# Patient Record
Sex: Male | Born: 1975 | Race: Black or African American | Hispanic: No | Marital: Married | Smoking: Current every day smoker
Health system: Southern US, Community
[De-identification: ages and names within clinical notes are randomized; demographics above are authoritative.]

## PROBLEM LIST (undated history)

## (undated) DIAGNOSIS — K219 Gastro-esophageal reflux disease without esophagitis: Secondary | ICD-10-CM

## (undated) DIAGNOSIS — I10 Essential (primary) hypertension: Secondary | ICD-10-CM

## (undated) DIAGNOSIS — E785 Hyperlipidemia, unspecified: Secondary | ICD-10-CM

## (undated) DIAGNOSIS — F419 Anxiety disorder, unspecified: Secondary | ICD-10-CM

## (undated) DIAGNOSIS — F209 Schizophrenia, unspecified: Secondary | ICD-10-CM

## (undated) DIAGNOSIS — M1990 Unspecified osteoarthritis, unspecified site: Secondary | ICD-10-CM

## (undated) HISTORY — PX: COLONOSCOPY: SHX174

## (undated) HISTORY — PX: MYRINGOTOMY WITH TUBE PLACEMENT: SHX5663

## (undated) HISTORY — PX: CYST EXCISION: SHX5701

---

## 2008-10-12 DIAGNOSIS — R945 Abnormal results of liver function studies: Secondary | ICD-10-CM | POA: Insufficient documentation

## 2009-01-11 DIAGNOSIS — E749 Disorder of carbohydrate metabolism, unspecified: Secondary | ICD-10-CM | POA: Insufficient documentation

## 2010-02-27 DIAGNOSIS — R030 Elevated blood-pressure reading, without diagnosis of hypertension: Secondary | ICD-10-CM | POA: Insufficient documentation

## 2014-06-23 ENCOUNTER — Emergency Department: Payer: Self-pay | Admitting: Emergency Medicine

## 2014-07-05 ENCOUNTER — Emergency Department: Payer: Self-pay | Admitting: Emergency Medicine

## 2016-07-04 ENCOUNTER — Ambulatory Visit
Admission: RE | Admit: 2016-07-04 | Discharge: 2016-07-04 | Disposition: A | Discharge status: Home / Self Care | Payer: Medicare Other | Source: Ambulatory Visit | Attending: Unknown Physician Specialty | Admitting: Unknown Physician Specialty

## 2016-07-04 ENCOUNTER — Other Ambulatory Visit: Payer: Self-pay | Admitting: Unknown Physician Specialty

## 2016-07-04 DIAGNOSIS — M25511 Pain in right shoulder: Secondary | ICD-10-CM | POA: Diagnosis not present

## 2016-07-04 DIAGNOSIS — X58XXXA Exposure to other specified factors, initial encounter: Secondary | ICD-10-CM | POA: Insufficient documentation

## 2016-07-04 DIAGNOSIS — S46811A Strain of other muscles, fascia and tendons at shoulder and upper arm level, right arm, initial encounter: Secondary | ICD-10-CM | POA: Insufficient documentation

## 2016-07-21 DIAGNOSIS — M7581 Other shoulder lesions, right shoulder: Secondary | ICD-10-CM | POA: Insufficient documentation

## 2016-07-21 DIAGNOSIS — M75121 Complete rotator cuff tear or rupture of right shoulder, not specified as traumatic: Secondary | ICD-10-CM | POA: Insufficient documentation

## 2016-08-14 ENCOUNTER — Encounter
Admission: RE | Admit: 2016-08-14 | Discharge: 2016-08-14 | Disposition: A | Discharge status: Home / Self Care | Payer: Medicare Other | Source: Ambulatory Visit | Attending: Surgery | Admitting: Surgery

## 2016-08-14 HISTORY — DX: Hyperlipidemia, unspecified: E78.5

## 2016-08-14 HISTORY — DX: Unspecified osteoarthritis, unspecified site: M19.90

## 2016-08-14 HISTORY — DX: Anxiety disorder, unspecified: F41.9

## 2016-08-14 HISTORY — DX: Gastro-esophageal reflux disease without esophagitis: K21.9

## 2016-08-14 NOTE — Patient Instructions (Signed)
  Your procedure is scheduled on: 08-21-16 Report to Same Day Surgery 2nd floor medical mall [facility](Medical Mall Entrance-take elevator on left to 2nd floor.  Check in with surgery information desk.) To find out your arrival time please call [phone] between 1PM - 3PM on 08-20-16  Remember: Instructions that are not followed completely may result in serious medical risk, up to and including death, or upon the discretion of your surgeon and anesthesiologist your surgery may need to be rescheduled.    _x___ 1. Do not eat food or drink liquids after midnight. No gum chewing or hard candies.     __x__ 2. No Alcohol for 24 hours before or after surgery.   __x__3. No Smoking for 24 prior to surgery.   ____  4. Bring all medications with you on the day of surgery if instructed.    __x__ 5. Notify your doctor if there is any change in your medical condition     (cold, fever, infections).     Do not wear jewelry, make-up, hairpins, clips or nail polish.  Do not wear lotions, powders, or perfumes. You may wear deodorant.  Do not shave 48 hours prior to surgery. Men may shave face and neck.  Do not bring valuables to the hospital.    [facility] any belongings or valuables.               Contacts, dentures or bridgework may not be worn into surgery.  Leave your suitcase in the car. After surgery it may be brought to your room.  For patients admitted to the hospital, discharge time is determined by your treatment team.   Patients discharged the day of surgery will not be allowed to drive home.  You will need someone to drive you home and stay with you the night of your procedure.    Please read over the following fact sheets that you were given:   [facility] Pre[facility] and or MRSA Information   _x___ Take these medicines the morning of surgery with A SIP OF WATER:    1. ABILIFY  2. MAY TAKE ATIVAN IF NEEDED AM OF SURGERY  3.  4.  5.  6.  ____Fleets enema  or Magnesium Citrate as directed.   ____ Use CHG Soap or sage wipes as directed on instruction sheet   ____ Use inhalers on the day of surgery and bring to hospital day of surgery  ____ Stop metformin 2 days prior to surgery    ____ Take 1/2 of usual insulin dose the night before surgery and none on the morning of surgery.   ____ Stop Aspirin, Coumadin, Pllavix ,Eliquis, Effient, or Pradaxa  x__ Stop Anti-inflammatories such as Advil, Aleve, Ibuprofen, Motrin, Naproxen, LODINE,          Naprosyn  Goodies powders or aspirin products NOW-Ok to take Tylenol OR HYDROCODONE IF NEEDED   ____ Stop supplements until after surgery.    ____ Bring C-Pap to the hospital.

## 2016-09-22 MED ORDER — CLINDAMYCIN PHOSPHATE 900 MG/50ML IV SOLN
900.0000 mg | Freq: Once | INTRAVENOUS | Status: AC
  Administered 2016-09-23: 900 mg via INTRAVENOUS

## 2016-09-23 ENCOUNTER — Ambulatory Visit
Admission: RE | Admit: 2016-09-23 | Discharge: 2016-09-23 | Disposition: A | Discharge status: Home / Self Care | Payer: Medicare Other | Source: Ambulatory Visit | Attending: Surgery | Admitting: Surgery

## 2016-09-23 ENCOUNTER — Ambulatory Visit: Payer: Medicare Other | Admitting: Certified Registered Nurse Anesthetist

## 2016-09-23 ENCOUNTER — Encounter: Payer: Self-pay | Admitting: *Deleted

## 2016-09-23 ENCOUNTER — Encounter
Admission: RE | Disposition: A | Discharge status: Home / Self Care | Payer: Self-pay | Source: Ambulatory Visit | Attending: Surgery

## 2016-09-23 DIAGNOSIS — Z880 Allergy status to penicillin: Secondary | ICD-10-CM | POA: Diagnosis not present

## 2016-09-23 DIAGNOSIS — E785 Hyperlipidemia, unspecified: Secondary | ICD-10-CM | POA: Insufficient documentation

## 2016-09-23 DIAGNOSIS — M65811 Other synovitis and tenosynovitis, right shoulder: Secondary | ICD-10-CM | POA: Diagnosis not present

## 2016-09-23 DIAGNOSIS — K219 Gastro-esophageal reflux disease without esophagitis: Secondary | ICD-10-CM | POA: Diagnosis not present

## 2016-09-23 DIAGNOSIS — X509XXA Other and unspecified overexertion or strenuous movements or postures, initial encounter: Secondary | ICD-10-CM | POA: Diagnosis not present

## 2016-09-23 DIAGNOSIS — M7541 Impingement syndrome of right shoulder: Secondary | ICD-10-CM | POA: Insufficient documentation

## 2016-09-23 DIAGNOSIS — F419 Anxiety disorder, unspecified: Secondary | ICD-10-CM | POA: Insufficient documentation

## 2016-09-23 DIAGNOSIS — S46011A Strain of muscle(s) and tendon(s) of the rotator cuff of right shoulder, initial encounter: Secondary | ICD-10-CM | POA: Diagnosis present

## 2016-09-23 DIAGNOSIS — M1990 Unspecified osteoarthritis, unspecified site: Secondary | ICD-10-CM | POA: Diagnosis not present

## 2016-09-23 DIAGNOSIS — Y9389 Activity, other specified: Secondary | ICD-10-CM | POA: Diagnosis not present

## 2016-09-23 HISTORY — PX: SHOULDER ARTHROSCOPY WITH OPEN ROTATOR CUFF REPAIR: SHX6092

## 2016-09-23 HISTORY — PX: SHOULDER ARTHROSCOPY WITH SUBACROMIAL DECOMPRESSION: SHX5684

## 2016-09-23 SURGERY — ARTHROSCOPY, SHOULDER WITH REPAIR, ROTATOR CUFF, OPEN
Anesthesia: General | Site: Shoulder | Laterality: Right

## 2016-09-23 MED ORDER — SUGAMMADEX SODIUM 200 MG/2ML IV SOLN
INTRAVENOUS | Status: AC
  Filled 2016-09-23: qty 2

## 2016-09-23 MED ORDER — LIDOCAINE HCL (PF) 4 % IJ SOLN
INTRAMUSCULAR | Status: DC | PRN
  Administered 2016-09-23: 4 mL via RESPIRATORY_TRACT

## 2016-09-23 MED ORDER — GLYCOPYRROLATE 0.2 MG/ML IJ SOLN
INTRAMUSCULAR | Status: AC
  Filled 2016-09-23: qty 1

## 2016-09-23 MED ORDER — FENTANYL CITRATE (PF) 250 MCG/5ML IJ SOLN
INTRAMUSCULAR | Status: AC
  Filled 2016-09-23: qty 5

## 2016-09-23 MED ORDER — BUPIVACAINE-EPINEPHRINE (PF) 0.5% -1:200000 IJ SOLN
INTRAMUSCULAR | Status: AC
  Filled 2016-09-23: qty 30

## 2016-09-23 MED ORDER — PHENYLEPHRINE HCL 10 MG/ML IJ SOLN
INTRAVENOUS | Status: DC | PRN
  Administered 2016-09-23: 20 ug/min via INTRAVENOUS

## 2016-09-23 MED ORDER — ROPIVACAINE HCL 5 MG/ML IJ SOLN
INTRAMUSCULAR | Status: DC | PRN
  Administered 2016-09-23: 30 mL via EPIDURAL

## 2016-09-23 MED ORDER — FENTANYL CITRATE (PF) 100 MCG/2ML IJ SOLN
INTRAMUSCULAR | Status: DC | PRN
  Administered 2016-09-23: 50 ug via INTRAVENOUS
  Administered 2016-09-23: 100 ug via INTRAVENOUS

## 2016-09-23 MED ORDER — OXYCODONE HCL 5 MG PO TABS: 5.0000 mg | ORAL_TABLET | ORAL | 0 refills | Status: DC | PRN

## 2016-09-23 MED ORDER — FAMOTIDINE 20 MG PO TABS
ORAL_TABLET | ORAL | Status: AC
  Filled 2016-09-23: qty 1

## 2016-09-23 MED ORDER — BUPIVACAINE-EPINEPHRINE 0.5% -1:200000 IJ SOLN
INTRAMUSCULAR | Status: DC | PRN
  Administered 2016-09-23: 30 mL

## 2016-09-23 MED ORDER — ROCURONIUM BROMIDE 50 MG/5ML IV SOLN
INTRAVENOUS | Status: AC
  Filled 2016-09-23: qty 1

## 2016-09-23 MED ORDER — ONDANSETRON HCL 4 MG PO TABS
4.0000 mg | ORAL_TABLET | Freq: Four times a day (QID) | ORAL | Status: DC | PRN
Start: 2016-09-23 — End: 2016-09-23

## 2016-09-23 MED ORDER — SUGAMMADEX SODIUM 200 MG/2ML IV SOLN
INTRAVENOUS | Status: DC | PRN
  Administered 2016-09-23: 100 mg via INTRAVENOUS

## 2016-09-23 MED ORDER — CLINDAMYCIN PHOSPHATE 900 MG/50ML IV SOLN
INTRAVENOUS | Status: AC
  Filled 2016-09-23: qty 50

## 2016-09-23 MED ORDER — LIDOCAINE HCL (PF) 2 % IJ SOLN
INTRAMUSCULAR | Status: AC
  Filled 2016-09-23: qty 2

## 2016-09-23 MED ORDER — FAMOTIDINE 20 MG PO TABS
20.0000 mg | ORAL_TABLET | Freq: Once | ORAL | Status: AC
  Administered 2016-09-23: 20 mg via ORAL

## 2016-09-23 MED ORDER — ONDANSETRON HCL 4 MG/2ML IJ SOLN: 4.0000 mg | Freq: Once | INTRAMUSCULAR | Status: DC | PRN

## 2016-09-23 MED ORDER — ONDANSETRON HCL 4 MG/2ML IJ SOLN: 4.0000 mg | Freq: Four times a day (QID) | INTRAMUSCULAR | Status: DC | PRN

## 2016-09-23 MED ORDER — ROCURONIUM BROMIDE 100 MG/10ML IV SOLN
INTRAVENOUS | Status: DC | PRN
  Administered 2016-09-23: 50 mg via INTRAVENOUS

## 2016-09-23 MED ORDER — FENTANYL CITRATE (PF) 100 MCG/2ML IJ SOLN: 25.0000 ug | INTRAMUSCULAR | Status: DC | PRN

## 2016-09-23 MED ORDER — LIDOCAINE HCL (CARDIAC) 20 MG/ML IV SOLN
INTRAVENOUS | Status: DC | PRN
  Administered 2016-09-23: 70 mg via INTRAVENOUS

## 2016-09-23 MED ORDER — DEXAMETHASONE SODIUM PHOSPHATE 10 MG/ML IJ SOLN
INTRAMUSCULAR | Status: AC
  Filled 2016-09-23: qty 1

## 2016-09-23 MED ORDER — EPINEPHRINE PF 1 MG/ML IJ SOLN
INTRAMUSCULAR | Status: AC
  Filled 2016-09-23: qty 2

## 2016-09-23 MED ORDER — PROPOFOL 10 MG/ML IV BOLUS
INTRAVENOUS | Status: AC
  Filled 2016-09-23: qty 20

## 2016-09-23 MED ORDER — OXYCODONE HCL 5 MG PO TABS: 5.0000 mg | ORAL_TABLET | ORAL | Status: DC | PRN

## 2016-09-23 MED ORDER — EPINEPHRINE PF 1 MG/ML IJ SOLN
INTRAMUSCULAR | Status: DC | PRN
  Administered 2016-09-23: 2 mL

## 2016-09-23 MED ORDER — PROPOFOL 10 MG/ML IV BOLUS
INTRAVENOUS | Status: DC | PRN
  Administered 2016-09-23: 200 mg via INTRAVENOUS

## 2016-09-23 MED ORDER — METOCLOPRAMIDE HCL 5 MG/ML IJ SOLN: 5.0000 mg | Freq: Three times a day (TID) | INTRAMUSCULAR | Status: DC | PRN

## 2016-09-23 MED ORDER — METOCLOPRAMIDE HCL 10 MG PO TABS: 5.0000 mg | ORAL_TABLET | Freq: Three times a day (TID) | ORAL | Status: DC | PRN

## 2016-09-23 MED ORDER — DEXAMETHASONE SODIUM PHOSPHATE 4 MG/ML IJ SOLN
INTRAMUSCULAR | Status: DC | PRN
  Administered 2016-09-23: 5 mg via INTRAVENOUS

## 2016-09-23 MED ORDER — POTASSIUM CHLORIDE IN NACL 20-0.9 MEQ/L-% IV SOLN
INTRAVENOUS | Status: DC
  Filled 2016-09-23: qty 1000

## 2016-09-23 MED ORDER — MIDAZOLAM HCL 2 MG/2ML IJ SOLN
INTRAMUSCULAR | Status: AC
  Filled 2016-09-23: qty 2

## 2016-09-23 MED ORDER — MIDAZOLAM HCL 2 MG/2ML IJ SOLN
INTRAMUSCULAR | Status: DC | PRN
  Administered 2016-09-23 (×2): 2 mg via INTRAVENOUS

## 2016-09-23 MED ORDER — LACTATED RINGERS IV SOLN
INTRAVENOUS | Status: DC
Start: 2016-09-23 — End: 2016-09-23
  Administered 2016-09-23: 07:00:00 via INTRAVENOUS

## 2016-09-23 SURGICAL SUPPLY — 45 items
ANCHOR JUGGERKNOT WTAP NDL 2.9 (Anchor) ×8 IMPLANT
ANCHOR SUT QUATTRO KNTLS 4.5 (Anchor) ×4 IMPLANT
BIT DRILL JUGRKNT W/NDL BIT2.9 (DRILL) ×2 IMPLANT
BLADE FULL RADIUS 3.5 (BLADE) ×4 IMPLANT
BUR ACROMIONIZER 4.0 (BURR) ×4 IMPLANT
CANNULA SHAVER 8MMX76MM (CANNULA) ×8 IMPLANT
CHLORAPREP W/TINT 26ML (MISCELLANEOUS) ×4 IMPLANT
COVER MAYO STAND STRL (DRAPES) ×4 IMPLANT
DRAPE IMP U-DRAPE 54X76 (DRAPES) ×8 IMPLANT
DRILL JUGGERKNOT W/NDL BIT 2.9 (DRILL) ×4
DRSG OPSITE POSTOP 4X8 (GAUZE/BANDAGES/DRESSINGS) ×4 IMPLANT
ELECT REM PT RETURN 9FT ADLT (ELECTROSURGICAL) ×4
ELECTRODE REM PT RTRN 9FT ADLT (ELECTROSURGICAL) ×2 IMPLANT
GAUZE PETRO XEROFOAM 1X8 (MISCELLANEOUS) ×4 IMPLANT
GAUZE SPONGE 4X4 12PLY STRL (GAUZE/BANDAGES/DRESSINGS) ×4 IMPLANT
GLOVE BIO SURGEON STRL SZ7.5 (GLOVE) ×8 IMPLANT
GLOVE BIO SURGEON STRL SZ8 (GLOVE) ×8 IMPLANT
GLOVE BIOGEL PI IND STRL 8 (GLOVE) ×2 IMPLANT
GLOVE BIOGEL PI INDICATOR 8 (GLOVE) ×2
GLOVE INDICATOR 8.0 STRL GRN (GLOVE) ×4 IMPLANT
GOWN STRL REUS W/ TWL LRG LVL3 (GOWN DISPOSABLE) ×2 IMPLANT
GOWN STRL REUS W/ TWL XL LVL3 (GOWN DISPOSABLE) ×2 IMPLANT
GOWN STRL REUS W/TWL LRG LVL3 (GOWN DISPOSABLE) ×2
GOWN STRL REUS W/TWL XL LVL3 (GOWN DISPOSABLE) ×2
GRASPER SUT 15 45D LOW PRO (SUTURE) IMPLANT
IV LACTATED RINGER IRRG 3000ML (IV SOLUTION) ×4
IV LR IRRIG 3000ML ARTHROMATIC (IV SOLUTION) ×4 IMPLANT
MANIFOLD NEPTUNE II (INSTRUMENTS) ×4 IMPLANT
MASK FACE SPIDER DISP (MASK) ×4 IMPLANT
MAT BLUE FLOOR 46X72 FLO (MISCELLANEOUS) ×4 IMPLANT
NEEDLE MAYO 6 CRC TAPER PT (NEEDLE) ×4 IMPLANT
NEEDLE REVERSE CUT 1/2 CRC (NEEDLE) IMPLANT
PACK ARTHROSCOPY SHOULDER (MISCELLANEOUS) ×4 IMPLANT
SLING ARM LRG DEEP (SOFTGOODS) IMPLANT
SLING ULTRA II LG (MISCELLANEOUS) ×4 IMPLANT
STAPLER SKIN PROX 35W (STAPLE) ×4 IMPLANT
STRAP SAFETY BODY (MISCELLANEOUS) ×4 IMPLANT
SUT ETHIBOND 0 MO6 C/R (SUTURE) ×4 IMPLANT
SUT VIC AB 2-0 CT1 27 (SUTURE) ×4
SUT VIC AB 2-0 CT1 TAPERPNT 27 (SUTURE) ×4 IMPLANT
TAPE MICROFOAM 4IN (TAPE) ×4 IMPLANT
TUBING ARTHRO INFLOW-ONLY STRL (TUBING) ×4 IMPLANT
TUBING CONNECTING 10 (TUBING) ×3 IMPLANT
TUBING CONNECTING 10' (TUBING) ×1
WAND HAND CNTRL MULTIVAC 90 (MISCELLANEOUS) ×4 IMPLANT

## 2016-09-23 NOTE — Anesthesia Procedure Notes (Addendum)
Anesthesia Regional Block: Interscalene brachial plexus block   Pre-Anesthetic Checklist: ,, timeout performed, Correct Patient, Correct Site, Correct Laterality, Correct Procedure, Correct Position, site marked, Risks and benefits discussed,  Surgical consent,  Pre-op evaluation,  At surgeon's request and post-op pain management  Laterality: Right  Prep: chloraprep, alcohol swabs       Needles:  Injection technique: Single-shot  Needle Type: Stimiplex     Needle Length: 5cm  Needle Gauge: 22     Additional Needles:   Procedures: ultrasound guided, nerve stimulator,,,,,,   Nerve Stimulator or Paresthesia:  Response: biceps flexion, 0.48 mA,   Additional Responses:   Narrative:  Start time: 09/23/2016 7:45 AM End time: 09/23/2016 7:49 AM Injection made incrementally with aspirations every 5 mL.  Performed by: Personally  Anesthesiologist: Yves DillARROLL, Devion Chriscoe  Additional Notes: Time out called.  Patient in semi-recumbent position with a roll placed under his right back.  The right neck was prepped with chloroprep.  Sterile US gel was used and an US probe was used to visualize the nerve bundle in the C6 position.  A skin wheal was made lateral to the probe and a 22 G stimuplex needle was advanced to the bundle with the tip in view.  A biceps twitch was obtained with fade at 0.48 mAmps.  Easy incremental injection and no pain or blood on injection.  Patient tolerated the procedure well.

## 2016-09-23 NOTE — Anesthesia Postprocedure Evaluation (Signed)
Anesthesia Post Note  Patient: Justin Nixon  Procedure(s) Performed: Procedure(s) (LRB): SHOULDER ARTHROSCOPY WITH OPEN ROTATOR CUFF REPAIR (Right) SHOULDER ARTHROSCOPY WITH SUBACROMIAL DECOMPRESSION AND DEBRIDEMENT (Right)  Patient location during evaluation: PACU Anesthesia Type: General Level of consciousness: awake and alert and oriented Pain management: pain level controlled Vital Signs Assessment: post-procedure vital signs reviewed and stable Respiratory status: spontaneous breathing Cardiovascular status: blood pressure returned to baseline Anesthetic complications: no     Last Vitals:  Vitals:   09/23/16 1029 09/23/16 1059  BP: 135/88 (!) 132/97  Pulse: 80 71  Resp: 16 16  Temp: 36.2 C     Last Pain:  Vitals:   09/23/16 1029  TempSrc: Temporal                 Gayna Braddy

## 2016-09-23 NOTE — Anesthesia Procedure Notes (Signed)
Procedure Name: Intubation Date/Time: 09/23/2016 7:55 AM Performed by: Rosaria Ferries, Zander Ingham Pre-anesthesia Checklist: Patient identified, Emergency Drugs available, Suction available and Patient being monitored Patient Re-evaluated:Patient Re-evaluated prior to inductionOxygen Delivery Method: Circle system utilized Preoxygenation: Pre-oxygenation with 100% oxygen Intubation Type: IV induction Laryngoscope Size: Mac and 3 Grade View: Grade I Tube size: 7.0 mm Number of attempts: 1 Airway Equipment and Method: LTA kit utilized Placement Confirmation: ETT inserted through vocal cords under direct vision,  positive ETCO2 and breath sounds checked- equal and bilateral Secured at: 23 cm Tube secured with: Tape Dental Injury: Teeth and Oropharynx as per pre-operative assessment

## 2016-09-23 NOTE — Transfer of Care (Signed)
Immediate Anesthesia Transfer of Care Note  Patient: Justin Nixon  Procedure(s) Performed: Procedure(s): SHOULDER ARTHROSCOPY WITH OPEN ROTATOR CUFF REPAIR (Right) SHOULDER ARTHROSCOPY WITH SUBACROMIAL DECOMPRESSION AND DEBRIDEMENT (Right)  Patient Location: PACU  Anesthesia Type:General  Level of Consciousness: awake, alert , oriented and patient cooperative  Airway & Oxygen Therapy: Patient Spontanous Breathing and Patient connected to nasal cannula oxygen  Post-op Assessment: Report given to RN and Post -op Vital signs reviewed and stable  Post vital signs: Reviewed and stable  Last Vitals:  Vitals:   09/23/16 0612  BP: (!) 140/91  Pulse: 63  Resp: 16  Temp: 36.6 C    Last Pain:  Vitals:   09/23/16 0612  TempSrc: Tympanic         Complications: No apparent anesthesia complications

## 2016-09-23 NOTE — Anesthesia Preprocedure Evaluation (Signed)
Anesthesia Evaluation  Patient identified by MRN, date of birth, ID band Patient awake    Reviewed: Allergy & Precautions, NPO status , Patient's Chart, lab work & pertinent test results  Airway Mallampati: II       Dental no notable dental hx.    Pulmonary Current Smoker,    Pulmonary exam normal        Cardiovascular negative cardio ROS Normal cardiovascular exam     Neuro/Psych Anxiety negative neurological ROS     GI/Hepatic Neg liver ROS, GERD  Medicated and Controlled,  Endo/Other  negative endocrine ROS  Renal/GU negative Renal ROS  negative genitourinary   Musculoskeletal  (+) Arthritis , Osteoarthritis,    Abdominal Normal abdominal exam  (+)   Peds negative pediatric ROS (+)  Hematology negative hematology ROS (+)   Anesthesia Other Findings   Reproductive/Obstetrics                             Anesthesia Physical Anesthesia Plan  ASA: II  Anesthesia Plan: General   Post-op Pain Management:    Induction: Intravenous  Airway Management Planned: Oral ETT  Additional Equipment:   Intra-op Plan:   Post-operative Plan: Extubation in OR  Informed Consent: I have reviewed the patients History and Physical, chart, labs and discussed the procedure including the risks, benefits and alternatives for the proposed anesthesia with the patient or authorized representative who has indicated his/her understanding and acceptance.   Dental advisory given  Plan Discussed with:   Anesthesia Plan Comments:         Anesthesia Quick Evaluation

## 2016-09-23 NOTE — Discharge Instructions (Addendum)
Keep dressing dry and intact.  May shower after dressing changed on post-op day #4 (Saturday).  Cover staples with Band-Aids after drying off. Apply ice frequently to shoulder. Take etodolac BID with meals for 7-10 days, then as necessary. Take oxycodone as prescribed when needed.  May supplement with ES Tylenol if necessary. Keep shoulder immobilizer on at all times except may remove for bathing purposes. Follow-up in 10-14 days or as scheduled.   AMBULATORY SURGERY  DISCHARGE INSTRUCTIONS   1) The drugs that you were given will stay in your system until tomorrow so for the next 24 hours you should not:  A) Drive an automobile B) Make any legal decisions C) Drink any alcoholic beverage   2) You may resume regular meals tomorrow.  Today it is better to start with liquids and gradually work up to solid foods.  You may eat anything you prefer, but it is better to start with liquids, then soup and crackers, and gradually work up to solid foods.   3) Please notify your doctor immediately if you have any unusual bleeding, trouble breathing, redness and pain at the surgery site, drainage, fever, or pain not relieved by medication.    4) Additional Instructions:        Please contact your physician with any problems or Same Day Surgery at [phone], Monday through Friday 6 am to 4 pm, or [place] at [facility] Main number at [phone].

## 2016-09-23 NOTE — Anesthesia Post-op Follow-up Note (Cosign Needed)
Anesthesia QCDR form completed.        

## 2016-09-23 NOTE — H&P (Signed)
Paper H&P to be scanned into permanent record. H&P reviewed. No changes. 

## 2016-09-23 NOTE — Op Note (Signed)
09/23/2016  9:51 AM  Patient:   Justin Nixon  Pre-Op Diagnosis:   Impingement/tendinopathy with full-thickness rotator cuff tear, right shoulder.  Postoperative diagnosis: Impingement/tendinopathy with full-thickness rotator cuff tear and labral fraying, right shoulder.  Procedure: Limited arthroscopic debridement, arthroscopic subacromial decompression, and mini-open rotator cuff repair, right shoulder.  Anesthesia: General endotracheal with interscalene block placed preoperatively by the anesthesiologist.  Surgeon:   Maryagnes AmosJ. Jeffrey Nyari Olsson, MD  Assistant:   None  Findings: As above. There was moderate labral fraying anteriorly without glenoid detachment. The rotator cuff. In excellent condition from the articular side, other than some focal fraying at the supraspinatus/supraspinatus junction. The biceps tendon also was in excellent condition. There were grade 1 chondromalacial changes of the central portion of the glenoid. The humeral head articular cartilage was in excellent condition.  Complications: None  Fluids:   600 cc  Estimated blood loss: 5 cc  Tourniquet time: None  Drains: None  Closure: Staples   Brief clinical note: The patient is a 41 year old male who developed right shoulder pain following an injury. The patient's symptoms have progressed despite medications, activity modification, etc. The patient's history and examination are consistent with impingement/tendinopathy with a rotator cuff tear. These findings were confirmed by MRI scan. The patient presents at this time for definitive management of these shoulder symptoms.  Procedure: The patient was brought into the operating room and lain in the supine position. The patient then underwent placement of an interscalene block by the anesthesiologist. The patient underwent general endotracheal intubation and anesthesia before being repositioned in the beach chair position using the beach chair  positioner. The right shoulder and upper extremity were prepped with ChloraPrep solution before being draped sterilely. Preoperative antibiotics were administered. A timeout was performed to confirm the proper surgical site before the expected portal sites and incision site were injected with 0.5% Sensorcaine with epinephrine. A posterior portal was created and the glenohumeral joint thoroughly inspected with the findings as described above. An anterior portal was created using an outside-in technique. The labrum and rotator cuff were further probed, again confirming the above-noted findings. The areas of labral fraying and focal synovitis were debrided using the full-radius resector. The ArthroCare wand was inserted and used to obtain hemostasis as well as to "anneal" the labrum superiorly and anteriorly. The instruments were removed from the joint after suctioning the excess fluid.  The camera was repositioned through the posterior portal into the subacromial space. A separate lateral portal was created using an outside-in technique. The 3.5 mm full-radius resector was introduced and used to perform a subtotal bursectomy. The ArthroCare wand was then inserted and used to remove the periosteal tissue off the undersurface of the anterior third of the acromion as well as to recess the coracoacromial ligament from its attachment along the anterior and lateral margins of the acromion. The 4.0 mm acromionizing bur was introduced and used to complete the decompression by removing the undersurface of the anterior third of the acromion. The full radius resector was reintroduced to remove any residual bony debris before the ArthroCare wand was reintroduced to obtain hemostasis. The instruments were then removed from the subacromial space after suctioning the excess fluid.  An approximately 4-5 cm incision was made over the anterolateral aspect of the shoulder beginning at the anterolateral corner of the acromion and  extending distally in line with the bicipital groove. This incision was carried down through the subcutaneous tissues to expose the deltoid fascia. The raphae between the anterior and middle  thirds was identified and this plane developed to provide access into the subacromial space. Additional bursal tissues were debrided sharply using Metzenbaum scissors. The rotator cuff tear was readily identified. The margins were debrided sharply with a #15 blade and the exposed greater tuberosity roughened with a rongeur. The tear was repaired using two Biomet 2.9 mm JuggerKnot anchors. Several of these sutures were then brought back laterally and secured using a [employer] anchor to create a two-layer closure. An apparent watertight closure was obtained.  The wound was copiously irrigated with sterile saline solution before the deltoid raphae was reapproximated using 2-0 Vicryl interrupted sutures. The subcutaneous tissues were closed in two layers using 2-0 Vicryl interrupted sutures before the skin was closed using staples. The portal sites also were closed using staples. A sterile bulky dressing was applied to the shoulder before the arm was placed into a shoulder immobilizer. The patient was then awakened, extubated, and returned to the recovery room in satisfactory condition after tolerating the procedure well.

## 2016-09-29 DIAGNOSIS — M24111 Other articular cartilage disorders, right shoulder: Secondary | ICD-10-CM | POA: Insufficient documentation

## 2017-02-13 ENCOUNTER — Other Ambulatory Visit: Payer: Self-pay | Admitting: Surgery

## 2017-02-13 DIAGNOSIS — M7582 Other shoulder lesions, left shoulder: Secondary | ICD-10-CM

## 2017-03-06 ENCOUNTER — Ambulatory Visit
Admission: RE | Admit: 2017-03-06 | Discharge: 2017-03-06 | Disposition: A | Discharge status: Home / Self Care | Payer: Medicare Other | Source: Ambulatory Visit | Attending: Surgery | Admitting: Surgery

## 2017-03-06 DIAGNOSIS — M19012 Primary osteoarthritis, left shoulder: Secondary | ICD-10-CM | POA: Insufficient documentation

## 2017-03-06 DIAGNOSIS — M7582 Other shoulder lesions, left shoulder: Secondary | ICD-10-CM | POA: Diagnosis present

## 2017-03-09 ENCOUNTER — Ambulatory Visit: Payer: Medicare Other

## 2017-03-09 DIAGNOSIS — M19012 Primary osteoarthritis, left shoulder: Secondary | ICD-10-CM | POA: Insufficient documentation

## 2017-03-16 ENCOUNTER — Encounter: Payer: Self-pay | Admitting: *Deleted

## 2017-03-18 ENCOUNTER — Encounter
Admission: RE | Disposition: A | Discharge status: Home / Self Care | Payer: Self-pay | Source: Ambulatory Visit | Attending: Surgery

## 2017-03-18 ENCOUNTER — Ambulatory Visit: Payer: Medicare Other | Admitting: Anesthesiology

## 2017-03-18 ENCOUNTER — Ambulatory Visit
Admission: RE | Admit: 2017-03-18 | Discharge: 2017-03-18 | Disposition: A | Discharge status: Home / Self Care | Payer: Medicare Other | Source: Ambulatory Visit | Attending: Surgery | Admitting: Surgery

## 2017-03-18 DIAGNOSIS — M19012 Primary osteoarthritis, left shoulder: Secondary | ICD-10-CM | POA: Diagnosis not present

## 2017-03-18 DIAGNOSIS — X58XXXA Exposure to other specified factors, initial encounter: Secondary | ICD-10-CM | POA: Insufficient documentation

## 2017-03-18 DIAGNOSIS — M7582 Other shoulder lesions, left shoulder: Secondary | ICD-10-CM | POA: Diagnosis not present

## 2017-03-18 DIAGNOSIS — F17210 Nicotine dependence, cigarettes, uncomplicated: Secondary | ICD-10-CM | POA: Insufficient documentation

## 2017-03-18 DIAGNOSIS — S43431A Superior glenoid labrum lesion of right shoulder, initial encounter: Secondary | ICD-10-CM | POA: Insufficient documentation

## 2017-03-18 DIAGNOSIS — M25812 Other specified joint disorders, left shoulder: Secondary | ICD-10-CM | POA: Diagnosis not present

## 2017-03-18 DIAGNOSIS — Z79899 Other long term (current) drug therapy: Secondary | ICD-10-CM | POA: Diagnosis not present

## 2017-03-18 HISTORY — PX: SHOULDER ARTHROSCOPY: SHX128

## 2017-03-18 SURGERY — ARTHROSCOPY, SHOULDER
Anesthesia: General | Site: Shoulder | Laterality: Left | Wound class: Clean

## 2017-03-18 MED ORDER — ONDANSETRON HCL 4 MG/2ML IJ SOLN: 4.0000 mg | Freq: Once | INTRAMUSCULAR | Status: DC | PRN

## 2017-03-18 MED ORDER — SODIUM CHLORIDE 0.9 % IV SOLN
900.0000 mg | Freq: Once | INTRAVENOUS | Status: AC
  Administered 2017-03-18: 900 mg via INTRAVENOUS

## 2017-03-18 MED ORDER — ACETAMINOPHEN 325 MG PO TABS: 325.0000 mg | ORAL_TABLET | ORAL | Status: DC | PRN

## 2017-03-18 MED ORDER — OXYCODONE HCL 5 MG/5ML PO SOLN: 5.0000 mg | Freq: Once | ORAL | Status: DC | PRN

## 2017-03-18 MED ORDER — LIDOCAINE HCL (PF) 1 % IJ SOLN
INTRAMUSCULAR | Status: DC | PRN
  Administered 2017-03-18: 10 mL

## 2017-03-18 MED ORDER — ROPIVACAINE HCL 5 MG/ML IJ SOLN
INTRAMUSCULAR | Status: DC | PRN
  Administered 2017-03-18: 35 mL via PERINEURAL

## 2017-03-18 MED ORDER — LIDOCAINE HCL (CARDIAC) 20 MG/ML IV SOLN
INTRAVENOUS | Status: DC | PRN
  Administered 2017-03-18: 50 mg via INTRATRACHEAL

## 2017-03-18 MED ORDER — EPHEDRINE SULFATE 50 MG/ML IJ SOLN
INTRAMUSCULAR | Status: DC | PRN
  Administered 2017-03-18 (×5): 5 mg via INTRAVENOUS

## 2017-03-18 MED ORDER — GLYCOPYRROLATE 0.2 MG/ML IJ SOLN
INTRAMUSCULAR | Status: DC | PRN
  Administered 2017-03-18: 0.2 mg via INTRAVENOUS

## 2017-03-18 MED ORDER — MIDAZOLAM HCL 2 MG/2ML IJ SOLN
INTRAMUSCULAR | Status: DC | PRN
  Administered 2017-03-18: 2 mg via INTRAVENOUS

## 2017-03-18 MED ORDER — DEXAMETHASONE SODIUM PHOSPHATE 4 MG/ML IJ SOLN
INTRAMUSCULAR | Status: DC | PRN
  Administered 2017-03-18: 8 mg via INTRAVENOUS
  Administered 2017-03-18: 4 mg via PERINEURAL

## 2017-03-18 MED ORDER — OXYCODONE HCL 5 MG PO TABS
5.0000 mg | ORAL_TABLET | ORAL | 0 refills | Status: DC | PRN
Start: 2017-03-18 — End: 2019-02-04

## 2017-03-18 MED ORDER — LACTATED RINGERS IV SOLN
INTRAVENOUS | Status: DC
  Administered 2017-03-18: 10:00:00 via INTRAVENOUS

## 2017-03-18 MED ORDER — OXYCODONE HCL 5 MG PO TABS: 5.0000 mg | ORAL_TABLET | Freq: Once | ORAL | Status: DC | PRN

## 2017-03-18 MED ORDER — ACETAMINOPHEN 160 MG/5ML PO SOLN: 325.0000 mg | ORAL | Status: DC | PRN

## 2017-03-18 MED ORDER — FENTANYL CITRATE (PF) 100 MCG/2ML IJ SOLN: 25.0000 ug | INTRAMUSCULAR | Status: DC | PRN

## 2017-03-18 MED ORDER — PROPOFOL 10 MG/ML IV BOLUS
INTRAVENOUS | Status: DC | PRN
  Administered 2017-03-18: 150 mg via INTRAVENOUS

## 2017-03-18 MED ORDER — ONDANSETRON HCL 4 MG/2ML IJ SOLN
INTRAMUSCULAR | Status: DC | PRN
  Administered 2017-03-18: 4 mg via INTRAVENOUS

## 2017-03-18 MED ORDER — FENTANYL CITRATE (PF) 100 MCG/2ML IJ SOLN
INTRAMUSCULAR | Status: DC | PRN
  Administered 2017-03-18: 100 ug via INTRAVENOUS

## 2017-03-18 SURGICAL SUPPLY — 36 items
BIT DRILL JUGRKNT W/NDL BIT2.9 (DRILL) IMPLANT
BLADE FULL RADIUS 3.5 (BLADE) ×3 IMPLANT
BUR ACROMIONIZER 4.0 (BURR) ×3 IMPLANT
CANNULA SHAVER 8MMX76MM (CANNULA) ×3 IMPLANT
CHLORAPREP W/TINT 26ML (MISCELLANEOUS) ×3 IMPLANT
COVER LIGHT HANDLE UNIVERSAL (MISCELLANEOUS) ×6 IMPLANT
COVER MAYO STAND STRL (DRAPES) IMPLANT
DRAPE IMP U-DRAPE 54X76 (DRAPES) ×6 IMPLANT
DRILL JUGGERKNOT W/NDL BIT 2.9 (DRILL)
GAUZE PETRO XEROFOAM 1X8 (MISCELLANEOUS) ×3 IMPLANT
GAUZE SPONGE 4X4 12PLY STRL (GAUZE/BANDAGES/DRESSINGS) ×3 IMPLANT
GLOVE BIO SURGEON STRL SZ8 (GLOVE) ×9 IMPLANT
GLOVE INDICATOR 8.0 STRL GRN (GLOVE) ×6 IMPLANT
GOWN STRL REUS W/ TWL LRG LVL3 (GOWN DISPOSABLE) ×1 IMPLANT
GOWN STRL REUS W/ TWL XL LVL3 (GOWN DISPOSABLE) ×1 IMPLANT
GOWN STRL REUS W/TWL LRG LVL3 (GOWN DISPOSABLE) ×2
GOWN STRL REUS W/TWL XL LVL3 (GOWN DISPOSABLE) ×2
IV LACTATED RINGER IRRG 3000ML (IV SOLUTION) ×4
IV LR IRRIG 3000ML ARTHROMATIC (IV SOLUTION) ×2 IMPLANT
MANIFOLD 4PT FOR NEPTUNE1 (MISCELLANEOUS) ×3 IMPLANT
MAT BLUE FLOOR 46X72 FLO (MISCELLANEOUS) ×3 IMPLANT
NEEDLE HYPO 21X1.5 SAFETY (NEEDLE) IMPLANT
NEEDLE REVERSE CUT 1/2 CRC (NEEDLE) ×3 IMPLANT
PACK ARTHROSCOPY SHOULDER (MISCELLANEOUS) ×3 IMPLANT
PAD GROUND ADULT SPLIT (MISCELLANEOUS) IMPLANT
SLING ARM M TX990204 (SOFTGOODS) ×3 IMPLANT
STAPLER SKIN PROX 35W (STAPLE) IMPLANT
STRAP BODY AND KNEE 60X3 (MISCELLANEOUS) ×6 IMPLANT
SUT ETHIBOND 0 MO6 C/R (SUTURE) IMPLANT
SUT VIC AB 2-0 CT1 27 (SUTURE)
SUT VIC AB 2-0 CT1 TAPERPNT 27 (SUTURE) IMPLANT
TAPE MICROFOAM 4IN (TAPE) ×3 IMPLANT
TUBING ARTHRO INFLOW-ONLY STRL (TUBING) ×3 IMPLANT
TUBING CONNECTING 10 (TUBING) ×2 IMPLANT
TUBING CONNECTING 10' (TUBING) ×1
WAND HAND CNTRL MULTIVAC 90 (MISCELLANEOUS) ×3 IMPLANT

## 2017-03-18 NOTE — Progress Notes (Signed)
Assisted Mike Stella ANMD with left, ultrasound guided, interscalene block. Side rails up, monitors on throughout procedure. See vital signs in flow sheet. Tolerated Procedure well.   

## 2017-03-18 NOTE — Anesthesia Preprocedure Evaluation (Signed)
Anesthesia Evaluation  Patient identified by MRN, date of birth, ID band Patient awake    Reviewed: Allergy & Precautions, H&P , NPO status , Patient's Chart, lab work & pertinent test results  Airway Mallampati: II  TM Distance: >3 FB Neck ROM: full    Dental  (+) Chipped, Missing,    Pulmonary Current Smoker,    Pulmonary exam normal breath sounds clear to auscultation       Cardiovascular Normal cardiovascular exam Rhythm:regular Rate:Normal     Neuro/Psych    GI/Hepatic GERD  ,  Endo/Other    Renal/GU      Musculoskeletal   Abdominal   Peds  Hematology   Anesthesia Other Findings   Reproductive/Obstetrics                             Anesthesia Physical Anesthesia Plan  ASA: II  Anesthesia Plan: General   Post-op Pain Management:  Regional for Post-op pain   Induction:   PONV Risk Score and Plan: 2 and Ondansetron, Dexamethasone and Midazolam  Airway Management Planned:   Additional Equipment:   Intra-op Plan:   Post-operative Plan:   Informed Consent: I have reviewed the patients History and Physical, chart, labs and discussed the procedure including the risks, benefits and alternatives for the proposed anesthesia with the patient or authorized representative who has indicated his/her understanding and acceptance.     Plan Discussed with: CRNA  Anesthesia Plan Comments:         Anesthesia Quick Evaluation

## 2017-03-18 NOTE — Anesthesia Procedure Notes (Signed)
Procedure Name: LMA Insertion Date/Time: 03/18/2017 11:33 AM Performed by: Andee PolesBUSH, Granvel Proudfoot Pre-anesthesia Checklist: Patient identified, Emergency Drugs available, Suction available, Timeout performed and Patient being monitored Patient Re-evaluated:Patient Re-evaluated prior to induction Oxygen Delivery Method: Circle system utilized Preoxygenation: Pre-oxygenation with 100% oxygen Induction Type: IV induction LMA: LMA inserted LMA Size: 4.0 Number of attempts: 1 Placement Confirmation: positive ETCO2 and breath sounds checked- equal and bilateral Tube secured with: Tape

## 2017-03-18 NOTE — H&P (Signed)
Paper H&P to be scanned into permanent record. H&P reviewed and patient re-examined. No changes. 

## 2017-03-18 NOTE — Anesthesia Procedure Notes (Signed)
Anesthesia Regional Block: Interscalene brachial plexus block   Pre-Anesthetic Checklist: ,, timeout performed, Correct Patient, Correct Site, Correct Laterality, Correct Procedure, Correct Position, site marked, Risks and benefits discussed,  Surgical consent,  Pre-op evaluation,  At surgeon's request and post-op pain management  Laterality: Left  Prep: chloraprep       Needles:  Injection technique: Single-shot  Needle Type: Stimiplex     Needle Length: 10cm  Needle Gauge: 21     Additional Needles:   Procedures: ultrasound guided,,,,,,,,  Narrative:  Start time: 03/18/2017 9:51 AM End time: 03/18/2017 9:57 AM Injection made incrementally with aspirations every 5 mL.  Performed by: Personally  Anesthesiologist: Ranee GosselinSTELLA, Justin Nixon  Additional Notes: Functioning IV was confirmed and monitors applied. Ultrasound guidance: relevant anatomy identified, needle position confirmed, local anesthetic spread visualized around nerve(s)., vascular puncture avoided.  Image printed for medical record.  Negative aspiration and no paresthesias; incremental administration of local anesthetic. The patient tolerated the procedure well. Vitals signes recorded in RN notes.

## 2017-03-18 NOTE — Discharge Instructions (Signed)
General Anesthesia, Adult, Care After These instructions provide you with information about caring for yourself after your procedure. Your health care provider may also give you more specific instructions. Your treatment has been planned according to current medical practices, but problems sometimes occur. Call your health care provider if you have any problems or questions after your procedure. What can I expect after the procedure? After the procedure, it is common to have:  Vomiting.  A sore throat.  Mental slowness.  It is common to feel:  Nauseous.  Cold or shivery.  Sleepy.  Tired.  Sore or achy, even in parts of your body where you did not have surgery.  Follow these instructions at home: For at least 24 hours after the procedure:  Do not: ? Participate in activities where you could fall or become injured. ? Drive. ? Use heavy machinery. ? Drink alcohol. ? Take sleeping pills or medicines that cause drowsiness. ? Make important decisions or sign legal documents. ? Take care of children on your own.  Rest. Eating and drinking  If you vomit, drink water, juice, or soup when you can drink without vomiting.  Drink enough fluid to keep your urine clear or pale yellow.  Make sure you have little or no nausea before eating solid foods.  Follow the diet recommended by your health care provider. General instructions  Have a responsible adult stay with you until you are awake and alert.  Return to your normal activities as told by your health care provider. Ask your health care provider what activities are safe for you.  Take over-the-counter and prescription medicines only as told by your health care provider.  If you smoke, do not smoke without supervision.  Keep all follow-up visits as told by your health care provider. This is important. Contact a health care provider if:  You continue to have nausea or vomiting at home, and medicines are not helpful.  You  cannot drink fluids or start eating again.  You cannot urinate after 8-12 hours.  You develop a skin rash.  You have fever.  You have increasing redness at the site of your procedure. Get help right away if:  You have difficulty breathing.  You have chest pain.  You have unexpected bleeding.  You feel that you are having a life-threatening or urgent problem. This information is not intended to replace advice given to you by your health care provider. Make sure you discuss any questions you have with your health care provider. Document Released: 10/27/2000 Document Revised: 12/24/2015 Document Reviewed: 07/05/2015 Elsevier Interactive Patient Education  2018 [employer]  Keep dressing dry and intact.  May shower after dressing changed on post-op day #4 (Sunday).  Cover staples with Band-Aids after drying off. Apply ice frequently to shoulder. Resume Lodine twice daily with meals. Take oxycodone as prescribed when needed.  May supplement with ES Tylenol if necessary. Keep shoulder sling on for first 3-4 days, then use as necessary for comfort. Follow-up in 10-14 days or as scheduled.

## 2017-03-18 NOTE — Anesthesia Postprocedure Evaluation (Signed)
Anesthesia Post Note  Patient: Juline PatchKeith L Fabry  Procedure(s) Performed: Procedure(s) (LRB): ARTHROSCOPY SHOULDER with debridement decompression and distal clavicle excision (Left)  Patient location during evaluation: PACU Anesthesia Type: General Level of consciousness: awake and alert and oriented Pain management: satisfactory to patient Vital Signs Assessment: post-procedure vital signs reviewed and stable Respiratory status: spontaneous breathing, nonlabored ventilation and respiratory function stable Cardiovascular status: blood pressure returned to baseline and stable Postop Assessment: Adequate PO intake and No signs of nausea or vomiting Anesthetic complications: no    Cherly BeachStella, Karlee Staff J

## 2017-03-18 NOTE — Op Note (Signed)
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  Procedure: Limited arthroscopic debridement, arthroscopic subacromial decompression, and arthroscopic distal clavicle excision, left shoulder.  Anesthesia: General LMA with interscalene block placed preoperatively by the anesthesiologist.  Surgeon:   Maryagnes Amos, MD  Assistant:   None  Findings: As above. There was mild fraying of the labrum anteriorly and superiorly without frank detachment from the glenoid. The biceps tendon and rotator cuff all were in excellent condition, as were the articular surfaces the glenoid and humerus.  Complications: None  Fluids:   400 cc  Estimated blood loss: 25 cc  Tourniquet time: None  Drains: None  Closure: Staples   Brief clinical note: The patient is a 41 year old male with a history of left shoulder pain. The patient's symptoms have progressed despite medications, activity modification, etc. The patient's history and examination are consistent with impingement/tendinopathy with a symptomatic AC joint. These findings were confirmed by MRI scan. The patient presents at this time for definitive management of these shoulder symptoms.  Procedure: The patient underwent placement of an interscalene block by the anesthesiologist in the preoperative holding area before being brought into the operating room and lain in the supine position. The patient then underwent general laryngeal mask anesthesia before being repositioned in the beach chair position using the beach chair positioner. The left shoulder and upper extremity were prepped with ChloraPrep solution before being draped sterilely. Preoperative antibiotics were administered. A timeout was performed to confirm  the proper surgical site before the expected portal sites and incision site were injected with 0.5% Sensorcaine with epinephrine. A posterior portal was created and the glenohumeral joint thoroughly inspected with the findings as described above. An anterior portal was created using an outside-in technique. The labrum and rotator cuff were further probed, again confirming the above-noted findings. The areas of labral fraying were debrided using the full-radius resector. The ArthroCare wand was inserted and used to obtain hemostasis as well as to "anneal" the labrum superiorly and anteriorly. The instruments were removed from the joint after suctioning the excess fluid.  The camera was repositioned through the posterior portal into the subacromial space. A separate lateral portal was created using an outside-in technique. The 3.5 mm full-radius resector was introduced and used to perform a subtotal bursectomy. The ArthroCare wand was then inserted and used to remove the periosteal tissue off the undersurface of the anterior third of the acromion as well as to recess the coracoacromial ligament from its attachment along the anterior and lateral margins of the acromion. The 4.0 mm acromionizing bur was introduced and used to complete the decompression by removing the undersurface of the anterior third of the acromion.   Attention was then directed more medially. The ArthroCare wand was used to debride the joint capsule and periosteal tissue off the underside of the distal clavicle to "skeletonize" it before the undersurface of the distal clavicle was to debride it using the 4 mm acromionizing bur. The camera was then repositioned in the lateral portal and the 4 mm acromionizing bur introduced to the anterior portal in order to complete excision of the distal clavicle. The full radius resector was reintroduced to remove any residual bony debris before the ArthroCare wand was reintroduced to obtain hemostasis. The  instruments were then removed from the subacromial space after suctioning the  excess fluid.  The portal sites were closed using staples. A sterile bulky dressing was applied to the shoulder before the arm was placed into a shoulder sling. The patient was then awakened, extubated, and returned to the recovery room in satisfactory condition after tolerating the procedure well.

## 2017-03-18 NOTE — Transfer of Care (Signed)
Immediate Anesthesia Transfer of Care Note  Patient: Justin Nixon  Procedure(s) Performed: Procedure(s): ARTHROSCOPY SHOULDER with debridement decompression and distal clavicle excision (Left)  Patient Location: PACU  Anesthesia Type: General  Level of Consciousness: awake, alert  and patient cooperative  Airway and Oxygen Therapy: Patient Spontanous Breathing and Patient connected to supplemental oxygen  Post-op Assessment: Post-op Vital signs reviewed, Patient's Cardiovascular Status Stable, Respiratory Function Stable, Patent Airway and No signs of Nausea or vomiting  Post-op Vital Signs: Reviewed and stable  Complications: No apparent anesthesia complications

## 2017-03-19 ENCOUNTER — Encounter: Payer: Self-pay | Admitting: Surgery

## 2017-10-16 DIAGNOSIS — F200 Paranoid schizophrenia: Secondary | ICD-10-CM | POA: Insufficient documentation

## 2019-02-04 ENCOUNTER — Other Ambulatory Visit: Payer: Self-pay

## 2019-02-04 ENCOUNTER — Emergency Department: Payer: Medicare Other

## 2019-02-04 ENCOUNTER — Emergency Department
Admission: EM | Admit: 2019-02-04 | Discharge: 2019-02-04 | Disposition: A | Discharge status: Home / Self Care | Payer: Medicare Other | Attending: Emergency Medicine | Admitting: Emergency Medicine

## 2019-02-04 ENCOUNTER — Encounter: Payer: Self-pay | Admitting: Emergency Medicine

## 2019-02-04 DIAGNOSIS — Y999 Unspecified external cause status: Secondary | ICD-10-CM | POA: Insufficient documentation

## 2019-02-04 DIAGNOSIS — W01190A Fall on same level from slipping, tripping and stumbling with subsequent striking against furniture, initial encounter: Secondary | ICD-10-CM | POA: Insufficient documentation

## 2019-02-04 DIAGNOSIS — Y9301 Activity, walking, marching and hiking: Secondary | ICD-10-CM | POA: Diagnosis not present

## 2019-02-04 DIAGNOSIS — F17210 Nicotine dependence, cigarettes, uncomplicated: Secondary | ICD-10-CM | POA: Diagnosis not present

## 2019-02-04 DIAGNOSIS — Z23 Encounter for immunization: Secondary | ICD-10-CM | POA: Diagnosis not present

## 2019-02-04 DIAGNOSIS — Y92018 Other place in single-family (private) house as the place of occurrence of the external cause: Secondary | ICD-10-CM | POA: Diagnosis not present

## 2019-02-04 DIAGNOSIS — S0081XA Abrasion of other part of head, initial encounter: Secondary | ICD-10-CM | POA: Insufficient documentation

## 2019-02-04 DIAGNOSIS — S060X1A Concussion with loss of consciousness of 30 minutes or less, initial encounter: Secondary | ICD-10-CM | POA: Diagnosis not present

## 2019-02-04 DIAGNOSIS — S0990XA Unspecified injury of head, initial encounter: Secondary | ICD-10-CM | POA: Diagnosis present

## 2019-02-04 MED ORDER — MECLIZINE HCL 25 MG PO TABS: 25.0000 mg | ORAL_TABLET | Freq: Three times a day (TID) | ORAL | 0 refills | Status: DC | PRN

## 2019-02-04 MED ORDER — ONDANSETRON 4 MG PO TBDP: 4.0000 mg | ORAL_TABLET | Freq: Three times a day (TID) | ORAL | 0 refills | Status: DC | PRN

## 2019-02-04 MED ORDER — ONDANSETRON 4 MG PO TBDP
4.0000 mg | ORAL_TABLET | Freq: Once | ORAL | Status: AC
  Administered 2019-02-04: 15:00:00 4 mg via ORAL
  Filled 2019-02-04: qty 1

## 2019-02-04 MED ORDER — IBUPROFEN 600 MG PO TABS: 600.0000 mg | ORAL_TABLET | Freq: Three times a day (TID) | ORAL | 0 refills | Status: DC | PRN

## 2019-02-04 MED ORDER — IBUPROFEN 600 MG PO TABS
600.0000 mg | ORAL_TABLET | Freq: Once | ORAL | Status: AC
  Administered 2019-02-04: 600 mg via ORAL
  Filled 2019-02-04: qty 1

## 2019-02-04 MED ORDER — TETANUS-DIPHTH-ACELL PERTUSSIS 5-2.5-18.5 LF-MCG/0.5 IM SUSP
0.5000 mL | Freq: Once | INTRAMUSCULAR | Status: AC
  Administered 2019-02-04: 15:00:00 0.5 mL via INTRAMUSCULAR
  Filled 2019-02-04: qty 0.5

## 2019-02-04 NOTE — ED Triage Notes (Signed)
Pt here with c/o fall early this am, tripped and fell, landing on glass table, knot noted above forehead, small lac noted and scabbed over, pt states 5 min after fall, had LOC and was out for about 5 min per onlookers. Refused to come to ED last night, here today for headache and lightheaded. Denies blood thinner use.

## 2019-02-04 NOTE — ED Provider Notes (Signed)
[facility] Emergency Department Provider Note  ____________________________________________   First MD Initiated Contact with Patient 02/04/19 1504     (approximate)  I have reviewed the triage vital signs and the nursing notes.   HISTORY  Chief Complaint Fall, Loss of Consciousness, and Head Injury    HPI Justin Nixon is a 43 y.o. male with no significant past medical history here with mild headache and dizziness after fall.  The patient states that around 1 AM, he was walking into another room.  He states he tripped, falling forward, hitting his head on a table.  He was sustained a superficial abrasion.  He laid on the ground for some time, stood up and went to the couch, then was noticed to have a witnessed, brief, resolved, episode in which he potentially passed out.  There is no generalized shaking.  There was no seizure-like activity.  No loss of bowel or bladder function and no tongue biting.  He states that since then, he went back to bed, and has had a mild, frontal, throbbing, headache.  He has had some mild paraspinal neck pain but no midline pain.  No upper extremity numbness or weakness.  Is not on blood thinners.  Denies known history of head injuries or concussions.  No other medical complaints.        Past Medical History:  Diagnosis Date  . Anxiety   . Arthritis   . GERD (gastroesophageal reflux disease)    H/O  . Hyperlipidemia     There are no active problems to display for this patient.   Past Surgical History:  Procedure Laterality Date  . CYST EXCISION    . MYRINGOTOMY WITH TUBE PLACEMENT    . SHOULDER ARTHROSCOPY Left 03/18/2017   Procedure: ARTHROSCOPY SHOULDER with debridement decompression and distal clavicle excision;  Surgeon: Christena FlakePoggi, John J, MD;  [facility];  Service: Orthopedics;  Laterality: Left;  . SHOULDER ARTHROSCOPY WITH OPEN ROTATOR CUFF REPAIR Right 09/23/2016   Procedure: SHOULDER ARTHROSCOPY  WITH OPEN ROTATOR CUFF REPAIR;  Surgeon: Christena FlakeJohn J Poggi, MD;  Location: [facility];  Service: Orthopedics;  Laterality: Right;  . SHOULDER ARTHROSCOPY WITH SUBACROMIAL DECOMPRESSION Right 09/23/2016   Procedure: SHOULDER ARTHROSCOPY WITH SUBACROMIAL DECOMPRESSION AND DEBRIDEMENT;  Surgeon: Christena FlakeJohn J Poggi, MD;  Location: [facility];  Service: Orthopedics;  Laterality: Right;    Prior to Admission medications   Medication Sig Start Date End Date Taking? Authorizing Provider  ARIPiprazole (ABILIFY) 20 MG tablet Take 20 mg by mouth daily.    Yes [provider]  etodolac (LODINE) 500 MG tablet Take 500 mg by mouth 2 (two) times daily. 07/04/16  Yes [provider]  LORazepam (ATIVAN) 0.5 MG tablet Take 0.5 mg by mouth 2 (two) times daily as needed for anxiety. 03/11/16  Yes [provider]  Multiple Vitamin (MULTIVITAMIN WITH MINERALS) TABS tablet Take 1 tablet by mouth daily.    Yes [provider]  traZODone (DESYREL) 100 MG tablet Take 100 mg by mouth at bedtime.   Yes [provider]  ibuprofen (ADVIL) 600 MG tablet Take 1 tablet (600 mg total) by mouth every 8 (eight) hours as needed for headache. 02/04/19   Shaune PollackIsaacs, Ivann Trimarco, MD  meclizine (ANTIVERT) 25 MG tablet Take 1 tablet (25 mg total) by mouth 3 (three) times daily as needed for dizziness. 02/04/19   Shaune PollackIsaacs, Roniyah Llorens, MD  ondansetron (ZOFRAN ODT) 4 MG disintegrating tablet Take 1 tablet (4 mg total) by mouth  every 8 (eight) hours as needed for nausea or vomiting. 02/04/19   Duffy Bruce, MD    Allergies Penicillins  No family history on file.  Social History Social History   Tobacco Use  . Smoking status: Current Every Day Smoker    Packs/day: 0.50    Years: 22.00    Pack years: 11.00    Types: Cigarettes  . Smokeless tobacco: Never Used  Substance Use Topics  . Alcohol use: Yes    Comment: OCC  . Drug use: No    Review of Systems  Review of Systems  Constitutional: Negative for chills and  fever.  HENT: Negative for sore throat.   Respiratory: Negative for shortness of breath.   Cardiovascular: Negative for chest pain.  Gastrointestinal: Negative for abdominal pain.  Genitourinary: Negative for flank pain.  Musculoskeletal: Positive for neck stiffness. Negative for neck pain.  Skin: Negative for rash and wound.  Allergic/Immunologic: Negative for immunocompromised state.  Neurological: Positive for headaches. Negative for weakness and numbness.  Hematological: Does not bruise/bleed easily.  All other systems reviewed and are negative.    ____________________________________________  PHYSICAL EXAM:      VITAL SIGNS: ED Triage Vitals  Enc Vitals Group     BP 02/04/19 1247 (!) 144/93     Pulse Rate 02/04/19 1247 (!) 114     Resp 02/04/19 1247 18     Temp 02/04/19 1247 99.3 F (37.4 C)     Temp Source 02/04/19 1247 Oral     SpO2 02/04/19 1247 97 %     Weight 02/04/19 1252 150 lb (68 kg)     Height 02/04/19 1252 6' (1.829 m)     Head Circumference --      Peak Flow --      Pain Score 02/04/19 1252 8     Pain Loc --      Pain Edu? --      Excl. in [place]? --      Physical Exam Vitals signs and nursing note reviewed.  Constitutional:      General: He is not in acute distress.    Appearance: He is well-developed.  HENT:     Head: Normocephalic and atraumatic.     Comments: Superficial abrasion to forehead, healing. No deep lacerations. Mild surrounding contusion/swelling. No drainage. No hemotympanum. No periorbital or postauricular ecchymoses. Eyes:     Conjunctiva/sclera: Conjunctivae normal.  Neck:     Musculoskeletal: Neck supple.  Cardiovascular:     Rate and Rhythm: Normal rate and regular rhythm.     Heart sounds: Normal heart sounds. No murmur. No friction rub.  Pulmonary:     Effort: Pulmonary effort is normal. No respiratory distress.     Breath sounds: Normal breath sounds. No wheezing or rales.  Abdominal:     General: There is no distension.      Palpations: Abdomen is soft.     Tenderness: There is no abdominal tenderness.  Skin:    General: Skin is warm.     Capillary Refill: Capillary refill takes less than 2 seconds.  Neurological:     Mental Status: He is alert and oriented to person, place, and time.     Motor: No abnormal muscle tone.       ____________________________________________   LABS (all labs ordered are listed, but only abnormal results are displayed)  Labs Reviewed - No data to display  ____________________________________________  EKG: Normal sinus rhythm, ventricular rate 72.  PR 135, QRS 94, QTc  423.  Diffuse J-point elevation, consistent with early repolarization. ________________________________________  RADIOLOGY All imaging, including plain films, CT scans, and ultrasounds, independently reviewed by me, and interpretations confirmed via formal radiology reads.  ED MD interpretation:   CT head: No acute intracranial abnormality.  Official radiology report(s): Ct Head Wo Contrast  Result Date: 02/04/2019 CLINICAL DATA:  Patient status post fall. Forehead laceration. Loss of consciousness. Headache. EXAM: CT HEAD WITHOUT CONTRAST TECHNIQUE: Contiguous axial images were obtained from the base of the skull through the vertex without intravenous contrast. COMPARISON:  None. FINDINGS: Brain: Ventricles and sulci are appropriate for patient's age. No evidence for acute cortically based infarct, intracranial hemorrhage, mass lesion or mass-effect. Vascular: Unremarkable Skull: Intact Sinuses/Orbits: Paranasal sinuses are well aerated. Mastoid air cells are unremarkable. Orbits are unremarkable. Other: None. IMPRESSION: No acute intracranial process. Electronically Signed   By: Annia Beltrew  Davis M.D.   On: 02/04/2019 13:45    ____________________________________________  PROCEDURES   Procedure(s) performed (including Critical Care):  Procedures  ____________________________________________  INITIAL  IMPRESSION / MDM / ASSESSMENT AND PLAN / ED COURSE  As part of my medical decision making, I reviewed the following data within the electronic MEDICAL RECORD NUMBER Notes from prior ED visits and [place] Controlled Substance Database      *Justin PatchKeith L Rittenberry was evaluated in Emergency Department on 02/04/2019 for the symptoms described in the history of present illness. He was evaluated in the context of the global COVID-19 pandemic, which necessitated consideration that the patient might be at risk for infection with the SARS-CoV-2 virus that causes COVID-19. Institutional protocols and algorithms that pertain to the evaluation of patients at risk for COVID-19 are in a state of rapid change based on information released by regulatory bodies including the CDC and federal and state organizations. These policies and algorithms were followed during the patient's care in the ED.  Some ED evaluations and interventions may be delayed as a result of limited staffing during the pandemic.*      Medical Decision Making: 43 year old male here with superficial head abrasion and transient syncope in the setting of head trauma last night.  He is now greater than 12 hours after his injury.  CT head is negative.  He has some mild paraspinal neck pain which I suspect is musculoskeletal with no midline pain.  No evidence to suggest significant cervical injury.  He is otherwise afebrile, well-appearing, and ambulatory without difficulty here.  He has no focal neurological deficits.  EKG shows no apparent ischemia or arrhythmia or evidence for alternative etiology of his syncope other than mild TBI.  I had a long discussion with him regarding concussion precautions.  Tetanus updated.  Given that his laceration is greater than 12 hours, and is already healing and superficial, will advise local wound care with mupirocin.  Discharge home.  ____________________________________________  FINAL CLINICAL IMPRESSION(S) / ED DIAGNOSES  Final  diagnoses:  Concussion with loss of consciousness of 30 minutes or less, initial encounter  Abrasion of forehead, initial encounter     MEDICATIONS GIVEN DURING THIS VISIT:  Medications  Tdap (BOOSTRIX) injection 0.5 mL (0.5 mLs Intramuscular Given 02/04/19 1528)  ondansetron (ZOFRAN-ODT) disintegrating tablet 4 mg (4 mg Oral Given 02/04/19 1526)  ibuprofen (ADVIL) tablet 600 mg (600 mg Oral Given 02/04/19 1526)     ED Discharge Orders         Ordered    ondansetron (ZOFRAN ODT) 4 MG disintegrating tablet  Every 8 hours PRN     02/04/19  1532    meclizine (ANTIVERT) 25 MG tablet  3 times daily PRN     02/04/19 1532    ibuprofen (ADVIL) 600 MG tablet  Every 8 hours PRN     02/04/19 1532           Note:  This document was prepared using Dragon voice recognition software and may include unintentional dictation errors.   Shaune PollackIsaacs, Taylor Spilde, MD 02/04/19 [phone]

## 2019-02-04 NOTE — Discharge Instructions (Signed)
Apply topical triple antibiotic, which can be purchased over-the-counter, to your abrasion twice a day.  Avoid direct sunlight.  Take at least the next 72 hours off of work.  If you are feeling better and have no dizziness or headache, you can return to work, but make sure that you stop what you are doing if you develop recurrent symptoms.  Drink plenty of fluids.

## 2019-02-04 NOTE — ED Notes (Signed)
Pt states around 1am this morning he tripped and fell hitting the top of his head on the corner on a table and did not loose consciousness immediately but states with in 1 hr of the fall passed out for a couple of minutes, states he has been lightheaded with HA and noted swelling with abrasions.

## 2019-05-27 ENCOUNTER — Other Ambulatory Visit: Payer: Self-pay

## 2019-05-27 DIAGNOSIS — F17210 Nicotine dependence, cigarettes, uncomplicated: Secondary | ICD-10-CM | POA: Diagnosis not present

## 2019-05-27 DIAGNOSIS — Z79899 Other long term (current) drug therapy: Secondary | ICD-10-CM | POA: Insufficient documentation

## 2019-05-27 DIAGNOSIS — Y929 Unspecified place or not applicable: Secondary | ICD-10-CM | POA: Insufficient documentation

## 2019-05-27 DIAGNOSIS — S61412A Laceration without foreign body of left hand, initial encounter: Secondary | ICD-10-CM | POA: Insufficient documentation

## 2019-05-27 DIAGNOSIS — W260XXA Contact with knife, initial encounter: Secondary | ICD-10-CM | POA: Diagnosis not present

## 2019-05-27 DIAGNOSIS — Y93G1 Activity, food preparation and clean up: Secondary | ICD-10-CM | POA: Diagnosis not present

## 2019-05-27 DIAGNOSIS — S6992XA Unspecified injury of left wrist, hand and finger(s), initial encounter: Secondary | ICD-10-CM | POA: Diagnosis present

## 2019-05-27 DIAGNOSIS — Y998 Other external cause status: Secondary | ICD-10-CM | POA: Insufficient documentation

## 2019-05-27 NOTE — ED Triage Notes (Signed)
Patient reports using knife to break up frozen hamburger and accidentally cut him self in a stabbing motion.  Patient with laceration noted to left hand at base of thumb.

## 2019-05-28 ENCOUNTER — Emergency Department
Admission: EM | Admit: 2019-05-28 | Discharge: 2019-05-28 | Disposition: A | Discharge status: Home / Self Care | Payer: Medicare Other | Attending: Emergency Medicine | Admitting: Emergency Medicine

## 2019-05-28 DIAGNOSIS — S61412A Laceration without foreign body of left hand, initial encounter: Secondary | ICD-10-CM

## 2019-05-28 NOTE — ED Notes (Signed)
Pt has small laceration noted to left palm. Bleeding controlled at this time. Adipose tissue noted at wound site. Pt states he accidentally cut self when cooking earlier.

## 2019-05-28 NOTE — Discharge Instructions (Signed)
Steri-strip should fall off in 1 week. If it is still adhered after 1 week you may peel it off. Return to the ER for worsening symptoms, increased redness/swelling, purulent drainage or other concerns.

## 2019-05-28 NOTE — ED Provider Notes (Signed)
[facility] Emergency Department Provider Note   ____________________________________________   First MD Initiated Contact with Patient 05/28/19 [phone]     (approximate)  I have reviewed the triage vital signs and the nursing notes.   HISTORY  Chief Complaint Laceration    HPI Justin Nixon is a 43 y.o. male who presents to the ED with a chief complaint of left hand laceration. Patient was separating frozen hamburger patties when the knife slipped and cut his left palm. Bleeding controlled.  Tetanus is up-to-date.  Voices no other injuries or complaints.       Past Medical History:  Diagnosis Date  . Anxiety   . Arthritis   . GERD (gastroesophageal reflux disease)    H/O  . Hyperlipidemia     There are no active problems to display for this patient.   Past Surgical History:  Procedure Laterality Date  . CYST EXCISION    . MYRINGOTOMY WITH TUBE PLACEMENT    . SHOULDER ARTHROSCOPY Left 03/18/2017   Procedure: ARTHROSCOPY SHOULDER with debridement decompression and distal clavicle excision;  Surgeon: Corky Mull, MD;  Location: [facility];  Service: Orthopedics;  Laterality: Left;  . SHOULDER ARTHROSCOPY WITH OPEN ROTATOR CUFF REPAIR Right 09/23/2016   Procedure: SHOULDER ARTHROSCOPY WITH OPEN ROTATOR CUFF REPAIR;  Surgeon: Corky Mull, MD;  Location: [facility];  Service: Orthopedics;  Laterality: Right;  . SHOULDER ARTHROSCOPY WITH SUBACROMIAL DECOMPRESSION Right 09/23/2016   Procedure: SHOULDER ARTHROSCOPY WITH SUBACROMIAL DECOMPRESSION AND DEBRIDEMENT;  Surgeon: Corky Mull, MD;  Location: [facility];  Service: Orthopedics;  Laterality: Right;    Prior to Admission medications   Medication Sig Start Date End Date Taking? Authorizing Provider  ARIPiprazole (ABILIFY) 20 MG tablet Take 20 mg by mouth daily.     [provider]  etodolac (LODINE) 500 MG tablet Take 500 mg by mouth 2 (two) times daily. 07/04/16   [provider]  ibuprofen (ADVIL) 600 MG tablet Take 1 tablet (600 mg total) by mouth every 8 (eight) hours as needed for headache. 02/04/19   Duffy Bruce, MD  LORazepam (ATIVAN) 0.5 MG tablet Take 0.5 mg by mouth 2 (two) times daily as needed for anxiety. 03/11/16   [provider]  meclizine (ANTIVERT) 25 MG tablet Take 1 tablet (25 mg total) by mouth 3 (three) times daily as needed for dizziness. 02/04/19   Duffy Bruce, MD  Multiple Vitamin (MULTIVITAMIN WITH MINERALS) TABS tablet Take 1 tablet by mouth daily.     [provider]  ondansetron (ZOFRAN ODT) 4 MG disintegrating tablet Take 1 tablet (4 mg total) by mouth every 8 (eight) hours as needed for nausea or vomiting. 02/04/19   Duffy Bruce, MD  traZODone (DESYREL) 100 MG tablet Take 100 mg by mouth at bedtime.    [provider]    Allergies Penicillins  No family history on file.  Social History Social History   Tobacco Use  . Smoking status: Current Every Day Smoker    Packs/day: 0.50    Years: 22.00    Pack years: 11.00    Types: Cigarettes  . Smokeless tobacco: Never Used  Substance Use Topics  . Alcohol use: Yes    Comment: OCC  . Drug use: No    Review of Systems  Constitutional: No fever/chills Eyes: No visual changes. ENT: No sore throat. Cardiovascular: Denies chest pain. Respiratory: Denies shortness of breath. Gastrointestinal: No abdominal pain.  No nausea, no vomiting.  No  diarrhea.  No constipation. Genitourinary: Negative for dysuria. Musculoskeletal: Positive for left hand laceration.  Negative for back pain. Skin: Negative for rash. Neurological: Negative for headaches, focal weakness or numbness.   ____________________________________________   PHYSICAL EXAM:  VITAL SIGNS: ED Triage Vitals  Enc Vitals Group     BP 05/27/19 2324 (!) 136/93     Pulse Rate 05/27/19 2324 93     Resp 05/27/19 2324 18     Temp 05/27/19 2324 98.1 F (36.7 C)     Temp Source  05/27/19 2324 Oral     SpO2 05/27/19 2324 98 %     Weight 05/27/19 2322 152 lb (68.9 kg)     Height 05/27/19 2322 6' (1.829 m)     Head Circumference --      Peak Flow --      Pain Score 05/27/19 2321 7     Pain Loc --      Pain Edu? --      Excl. in GC? --     Constitutional: Alert and oriented. Well appearing and in no acute distress. Eyes: Conjunctivae are normal. PERRL. EOMI. Head: Atraumatic. Nose: No congestion/rhinnorhea. Mouth/Throat: Mucous membranes are moist.  Oropharynx non-erythematous. Neck: No stridor.   Cardiovascular: Normal rate, regular rhythm. Grossly normal heart sounds.  Good peripheral circulation. Respiratory: Normal respiratory effort.  No retractions. Lungs CTAB. Gastrointestinal: Soft and nontender. No distention. No abdominal bruits. No CVA tenderness. Musculoskeletal:  Approximately 1 cm linear and well approximated laceration to left thenar eminence with mild surrounding swelling.  2+ radial pulse.  Brisk, less than 5-second capillary refill. No lower extremity tenderness nor edema.  No joint effusions. Neurologic:  Normal speech and language. No gross focal neurologic deficits are appreciated. No gait instability. Skin:  Skin is warm, dry and intact. No rash noted. Psychiatric: Mood and affect are normal. Speech and behavior are normal.  ____________________________________________   LABS (all labs ordered are listed, but only abnormal results are displayed)  Labs Reviewed - No data to display ____________________________________________  EKG  None ____________________________________________  RADIOLOGY  ED MD interpretation: None  Official radiology report(s): No results found.  ____________________________________________   PROCEDURES  Procedure(s) performed (including Critical Care):  Procedures   ____________________________________________   INITIAL IMPRESSION / ASSESSMENT AND PLAN / ED COURSE  As part of my medical  decision making, I reviewed the following data within the electronic MEDICAL RECORD NUMBER Nursing notes reviewed and incorporated and Notes from prior ED visits     Justin Nixon was evaluated in Emergency Department on 05/28/2019 for the symptoms described in the history of present illness. He was evaluated in the context of the global COVID-19 pandemic, which necessitated consideration that the patient might be at risk for infection with the SARS-CoV-2 virus that causes COVID-19. Institutional protocols and algorithms that pertain to the evaluation of patients at risk for COVID-19 are in a state of rapid change based on information released by regulatory bodies including the CDC and federal and state organizations. These policies and algorithms were followed during the patient's care in the ED.    43 year old male who presents with superficial left hand laceration.  Tiny piece of adipose tissue trimmed with iris scissors.  Given light contamination of the nature of injury, will soak in NS+betadine, apply Steri-Strips and bulky dressing.  Return precautions given.  Patient verbalizes understanding agrees with plan of care.      ____________________________________________   FINAL CLINICAL IMPRESSION(S) / ED DIAGNOSES  Final diagnoses:  Laceration of left hand, foreign body presence unspecified, initial encounter     ED Discharge Orders    None       Note:  This document was prepared using Dragon voice recognition software and may include unintentional dictation errors.   Irean Hong, MD 05/28/19 [phone]

## 2020-05-20 IMAGING — CT CT HEAD WITHOUT CONTRAST
4 series · 16 of 47 positions shown, 18 images · non-contrast
Comparison: None.

CLINICAL DATA: Patient status post fall. Forehead laceration. Loss
of consciousness. Headache.

EXAM:
CT HEAD WITHOUT CONTRAST
TECHNIQUE: Contiguous axial images were obtained from the base of the skull
through the vertex without intravenous contrast.

[Series 2: head wo · axial · 0.44mm/px · z∈[-80,+25]mm · 7 of 29 slices shown, 9 images]
[im 4/29  brain]
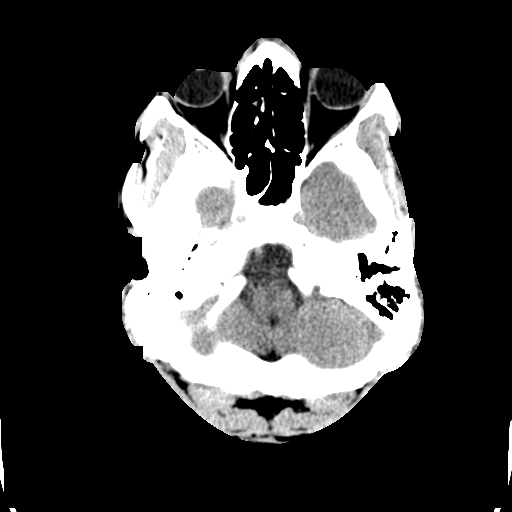
[im 4/29  bone]
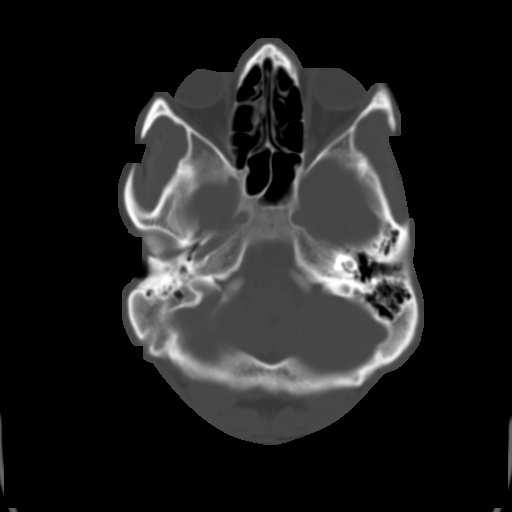
[im 8/29  brain]
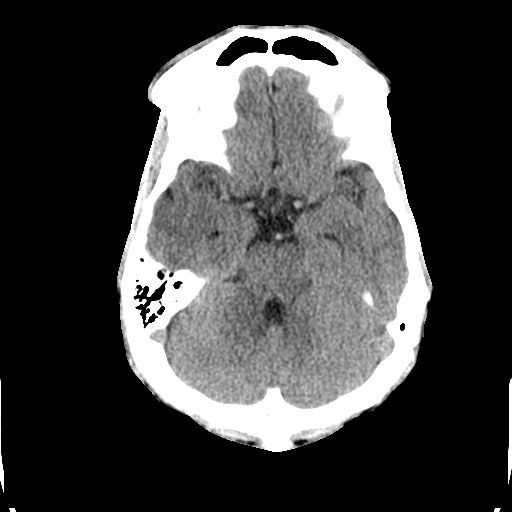
[im 11/29  brain]
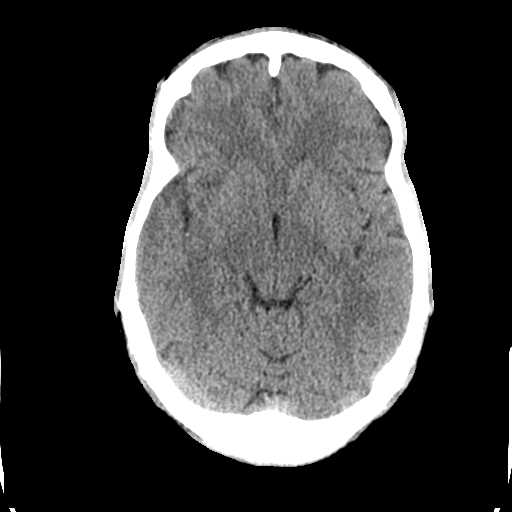
[im 15/29  brain]
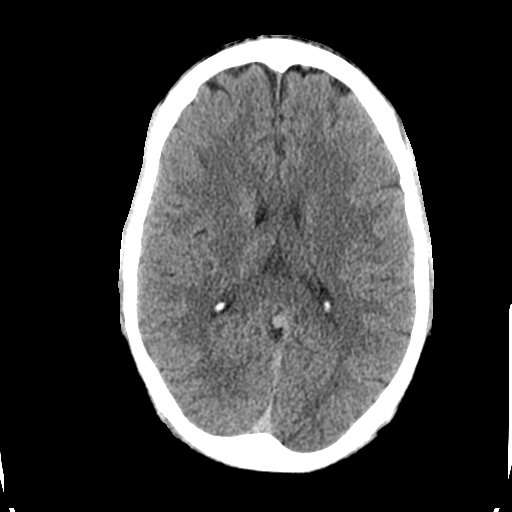
[im 18/29  brain]
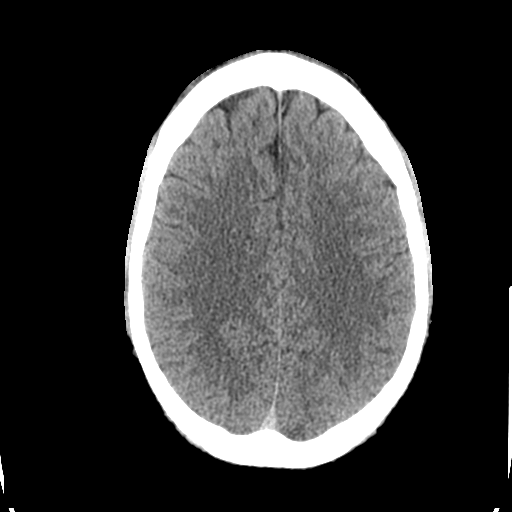
[im 18/29  bone]
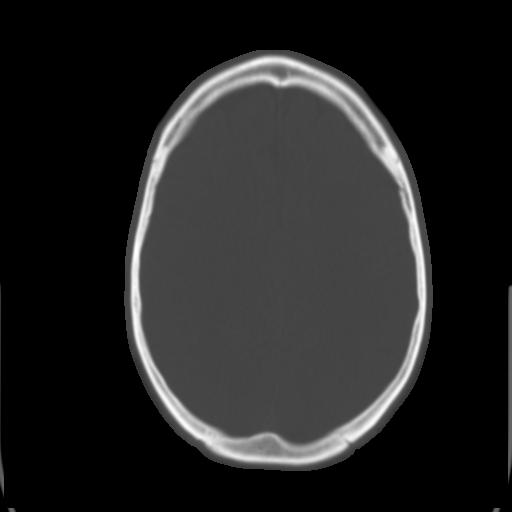
[im 22/29  brain]
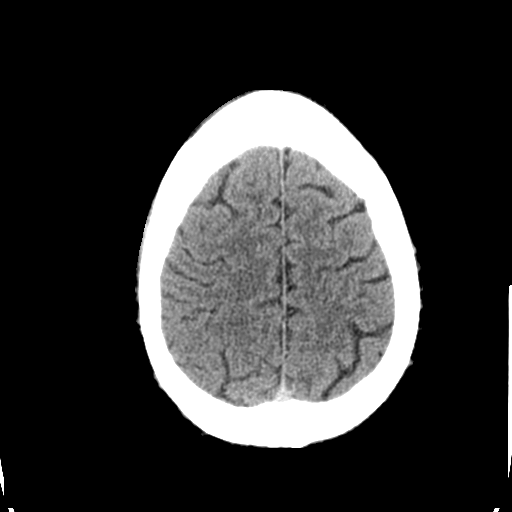
[im 25/29  brain]
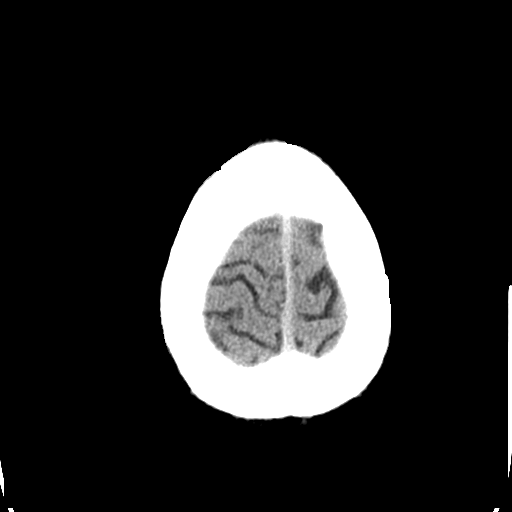

[Series 3: head bone · axial · 0.44mm/px · z∈[-81,-53]mm · 3 of 73 slices shown]
[im 8/73  bone]
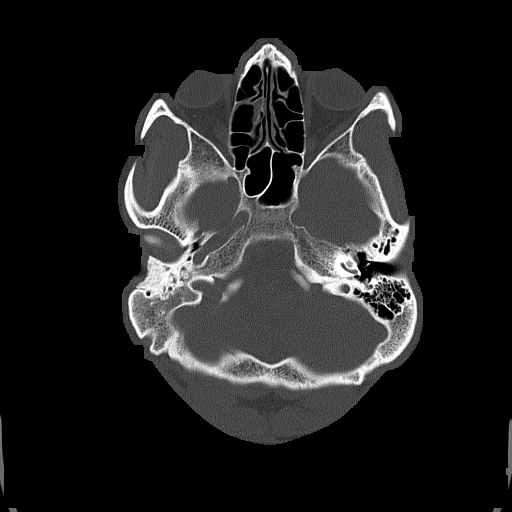
[im 15/73  bone]
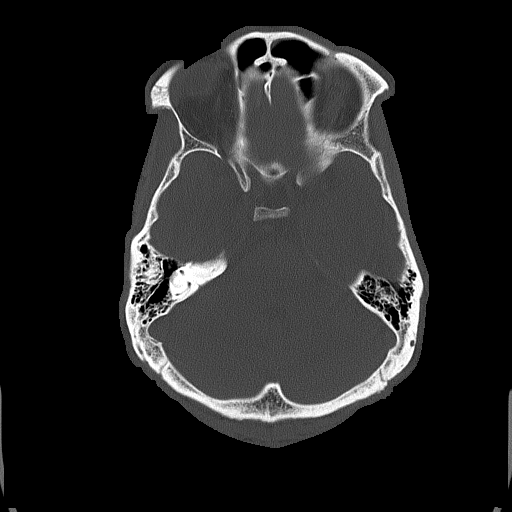
[im 22/73  bone]
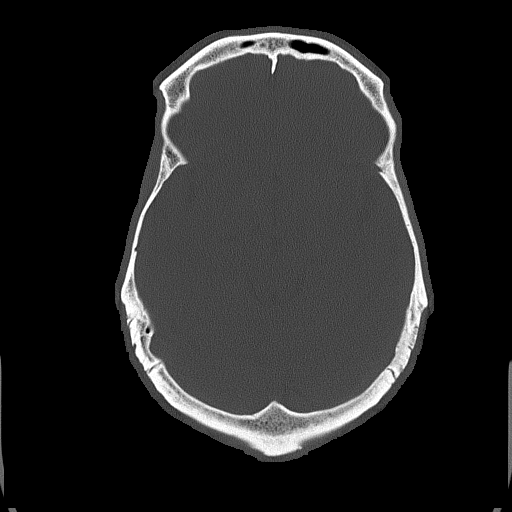

[Series 4: coronal soft tissue · coronal · 0.29mm/px · 3 of 69 slices shown]
[im 23/69  brain]
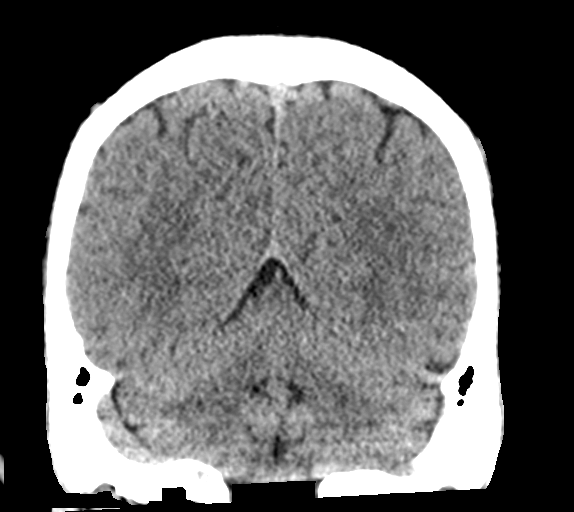
[im 31/69  brain]
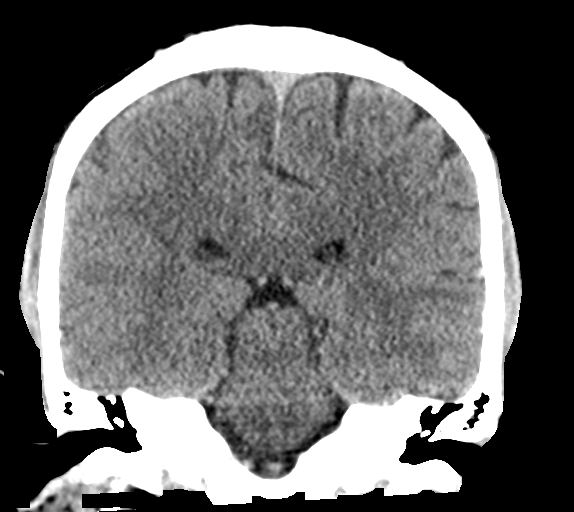
[im 38/69  brain]
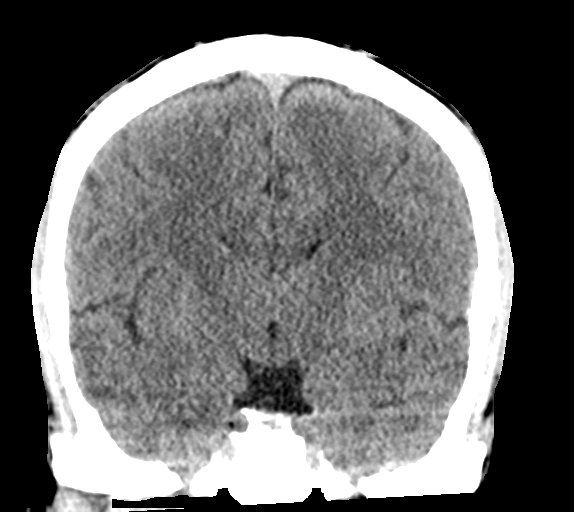

[Series 5: sagittal soft tissue · sagittal · 0.30mm/px · 3 of 56 slices shown]
[im 19/56  brain]
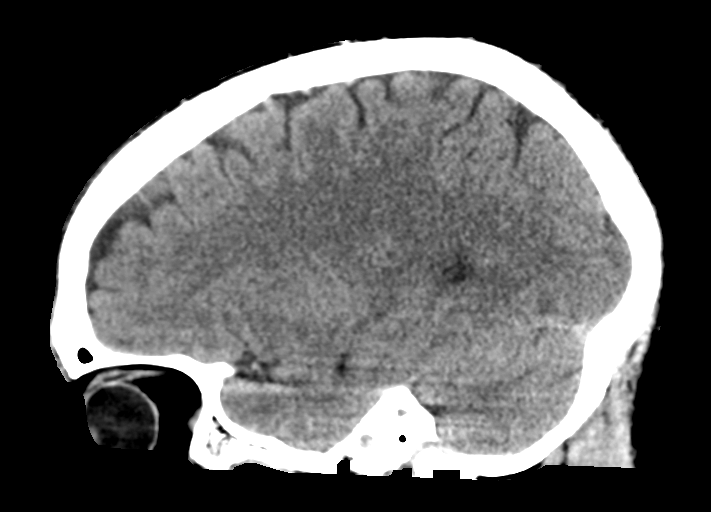
[im 28/56  brain]
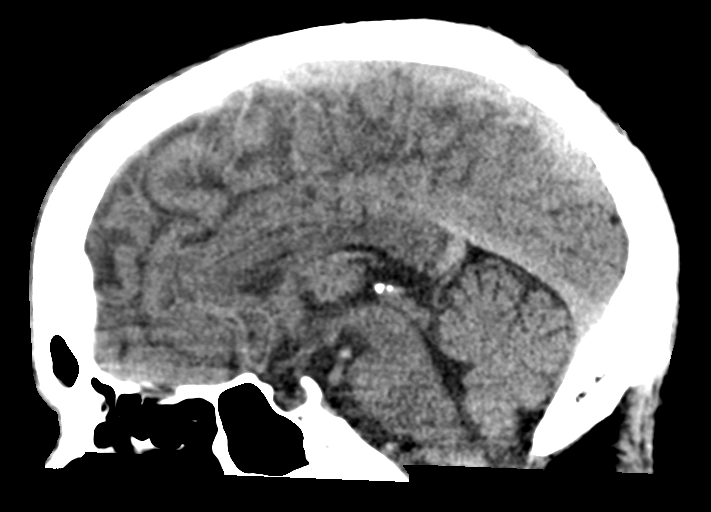
[im 37/56  brain]
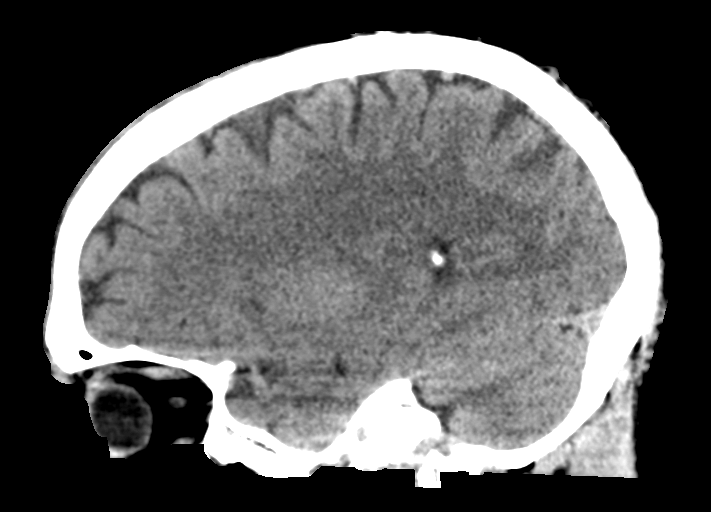

[16 of 47 positions shown; findings below may reference images not displayed]

FINDINGS: Brain: Ventricles and sulci are appropriate for patient's age. No
evidence for acute cortically based infarct, intracranial
hemorrhage, mass lesion or mass-effect.

Vascular: Unremarkable

Skull: Intact

Sinuses/Orbits: Paranasal sinuses are well aerated. Mastoid air
cells are unremarkable. Orbits are unremarkable.

Other: None.
IMPRESSION: No acute intracranial process.

## 2020-06-01 DIAGNOSIS — I10 Essential (primary) hypertension: Secondary | ICD-10-CM | POA: Insufficient documentation

## 2020-07-11 ENCOUNTER — Other Ambulatory Visit: Payer: Self-pay | Admitting: Student

## 2020-07-11 DIAGNOSIS — M7712 Lateral epicondylitis, left elbow: Secondary | ICD-10-CM

## 2020-07-11 DIAGNOSIS — G5622 Lesion of ulnar nerve, left upper limb: Secondary | ICD-10-CM

## 2020-07-11 DIAGNOSIS — M25522 Pain in left elbow: Secondary | ICD-10-CM

## 2020-07-21 ENCOUNTER — Other Ambulatory Visit: Payer: Self-pay

## 2020-07-21 ENCOUNTER — Ambulatory Visit
Admission: RE | Admit: 2020-07-21 | Discharge: 2020-07-21 | Disposition: A | Discharge status: Home / Self Care | Payer: Medicare Other | Source: Ambulatory Visit | Attending: Student | Admitting: Student

## 2020-07-21 DIAGNOSIS — M25522 Pain in left elbow: Secondary | ICD-10-CM

## 2020-07-21 DIAGNOSIS — M7712 Lateral epicondylitis, left elbow: Secondary | ICD-10-CM

## 2020-07-21 DIAGNOSIS — G5622 Lesion of ulnar nerve, left upper limb: Secondary | ICD-10-CM

## 2020-09-03 DIAGNOSIS — M7712 Lateral epicondylitis, left elbow: Secondary | ICD-10-CM | POA: Insufficient documentation

## 2020-09-03 DIAGNOSIS — M94222 Chondromalacia, left elbow: Secondary | ICD-10-CM | POA: Insufficient documentation

## 2020-10-01 ENCOUNTER — Other Ambulatory Visit: Payer: Self-pay | Admitting: Surgery

## 2020-10-02 ENCOUNTER — Other Ambulatory Visit: Payer: Self-pay

## 2020-10-02 ENCOUNTER — Encounter
Admission: RE | Admit: 2020-10-02 | Discharge: 2020-10-02 | Disposition: A | Discharge status: Home / Self Care | Payer: Medicare Other | Source: Ambulatory Visit | Attending: Surgery | Admitting: Surgery

## 2020-10-02 HISTORY — DX: Schizophrenia, unspecified: F20.9

## 2020-10-02 NOTE — Patient Instructions (Addendum)
Your procedure is scheduled on: 10/10/2020 Report to the Registration Desk on the 1st floor of the [facility]. To find out your arrival time, please call [phone] between 1PM - 3PM on: 10/09/2020  REMEMBER: Instructions that are not followed completely may result in serious medical risk, up to and including death; or upon the discretion of your surgeon and anesthesiologist your surgery may need to be rescheduled.  Do not eat food after midnight the night before surgery.  No gum chewing, lozengers or hard candies.  You may however, drink CLEAR liquids up to 2 hours before you are scheduled to arrive for your surgery. Do not drink anything within 2 hours of your scheduled arrival time.  Clear liquids include: - water  - apple juice without pulp - gatorade (not RED, PURPLE, OR BLUE) - black coffee or tea (Do NOT add milk or creamers to the coffee or tea) Do NOT drink anything that is not on this list.  Type 1 and Type 2 diabetics should only drink water.  In addition, your doctor has ordered for you to drink the provided  Ensure Pre-Surgery Clear Carbohydrate Drink  Drinking this carbohydrate drink up to two hours before surgery helps to reduce insulin resistance and improve patient outcomes. Please complete drinking 2 hours prior to scheduled arrival time.  TAKE THESE MEDICATIONS THE MORNING OF SURGERY WITH A SIP OF WATER: none   Stop taking 1 week prior to surgery: etodolac (LODINE) 500 MG tablet Stop Anti-inflammatories (NSAIDS) such as Advil, Aleve, Ibuprofen, Motrin, Naproxen, Naprosyn and Aspirin based products such as Excedrin, Goodys Powder, BC Powder.  Stop ANY OVER THE COUNTER supplements until after surgery.  No Alcohol for 24 hours before or after surgery.  No Smoking including e-cigarettes for 24 hours prior to surgery.  No chewable tobacco products for at least 6 hours prior to surgery.  No nicotine patches on the day of surgery.  Do not use any  "recreational" drugs for at least a week prior to your surgery.  Please be advised that the combination of cocaine and anesthesia may have negative outcomes, up to and including death. If you test positive for cocaine, your surgery will be cancelled.  On the morning of surgery brush your teeth with toothpaste and water, you may rinse your mouth with mouthwash if you wish. Do not swallow any toothpaste or mouthwash.  Do not wear jewelry, make-up, hairpins, clips or nail polish.  Do not wear lotions, powders, or perfumes.   Do not shave body from the neck down 48 hours prior to surgery just in case you cut yourself which could leave a site for infection.  Also, freshly shaved skin may become irritated if using the CHG soap.  Contact lenses, hearing aids and dentures may not be worn into surgery.  Do not bring valuables to the hospital. [facility] is not responsible for any missing/lost belongings or valuables.   Use CHG Soap or wipes as directed on instruction sheet.  Notify your doctor if there is any change in your medical condition (cold, fever, infection).  Wear comfortable clothing (specific to your surgery type) to the hospital.  Plan for stool softeners for home use; pain medications have a tendency to cause constipation. You can also help prevent constipation by eating foods high in fiber such as fruits and vegetables and drinking plenty of fluids as your diet allows.  After surgery, you can help prevent lung complications by doing breathing exercises.  Take deep breaths and cough  every 1-2 hours. Your doctor may order a device called an Incentive Spirometer to help you take deep breaths. When coughing or sneezing, hold a pillow firmly against your incision with both hands. This is called "splinting." Doing this helps protect your incision. It also decreases belly discomfort.  If you are being admitted to the hospital overnight, leave your suitcase in the car. After surgery it  may be brought to your room.  If you are being discharged the day of surgery, you will not be allowed to drive home. You will need a responsible adult (18 years or older) to drive you home and stay with you that night.   If you are taking public transportation, you will need to have a responsible adult (18 years or older) with you. Please confirm with your physician that it is acceptable to use public transportation.   Please call the [facility] at [phone] if you have any questions about these instructions.  Surgery Visitation Policy:  Patients undergoing a surgery or procedure may have one family member or support person with them as long as that person is not COVID-19 positive or experiencing its symptoms.  That person may remain in the waiting area during the procedure.  Inpatient Visitation:    Visiting hours are 7 a.m. to 8 p.m. Inpatients will be allowed two visitors daily. The visitors may change each day during the patient's stay. No visitors under the age of 33. Any visitor under the age of 1 must be accompanied by an adult. The visitor must pass COVID-19 screenings, use hand sanitizer when entering and exiting the patient's room and wear a mask at all times, including in the patient's room. Patients must also wear a mask when staff or their visitor are in the room. Masking is required regardless of vaccination status.

## 2020-10-08 ENCOUNTER — Other Ambulatory Visit: Payer: Self-pay

## 2020-10-08 ENCOUNTER — Other Ambulatory Visit
Admission: RE | Admit: 2020-10-08 | Discharge: 2020-10-08 | Disposition: A | Discharge status: Home / Self Care | Payer: Medicare Other | Source: Ambulatory Visit | Attending: Surgery | Admitting: Surgery

## 2020-10-08 DIAGNOSIS — Z20822 Contact with and (suspected) exposure to covid-19: Secondary | ICD-10-CM | POA: Diagnosis not present

## 2020-10-08 DIAGNOSIS — Z01812 Encounter for preprocedural laboratory examination: Secondary | ICD-10-CM | POA: Diagnosis present

## 2020-10-09 LAB — SARS CORONAVIRUS 2 (TAT 6-24 HRS): SARS Coronavirus 2: NEGATIVE

## 2020-10-10 ENCOUNTER — Ambulatory Visit: Payer: Medicare Other | Admitting: Certified Registered Nurse Anesthetist

## 2020-10-10 ENCOUNTER — Encounter: Payer: Self-pay | Admitting: Surgery

## 2020-10-10 ENCOUNTER — Encounter
Admission: RE | Disposition: A | Discharge status: Home / Self Care | Payer: Self-pay | Source: Home / Self Care | Attending: Surgery

## 2020-10-10 ENCOUNTER — Ambulatory Visit
Admission: RE | Admit: 2020-10-10 | Discharge: 2020-10-10 | Disposition: A | Discharge status: Home / Self Care | Payer: Medicare Other | Attending: Surgery | Admitting: Surgery

## 2020-10-10 ENCOUNTER — Other Ambulatory Visit: Payer: Self-pay

## 2020-10-10 DIAGNOSIS — M19022 Primary osteoarthritis, left elbow: Secondary | ICD-10-CM | POA: Insufficient documentation

## 2020-10-10 DIAGNOSIS — Z880 Allergy status to penicillin: Secondary | ICD-10-CM | POA: Diagnosis not present

## 2020-10-10 DIAGNOSIS — F17210 Nicotine dependence, cigarettes, uncomplicated: Secondary | ICD-10-CM | POA: Insufficient documentation

## 2020-10-10 DIAGNOSIS — M7712 Lateral epicondylitis, left elbow: Secondary | ICD-10-CM | POA: Diagnosis not present

## 2020-10-10 DIAGNOSIS — M94222 Chondromalacia, left elbow: Secondary | ICD-10-CM | POA: Insufficient documentation

## 2020-10-10 DIAGNOSIS — M24022 Loose body in left elbow: Secondary | ICD-10-CM | POA: Diagnosis not present

## 2020-10-10 DIAGNOSIS — Z79899 Other long term (current) drug therapy: Secondary | ICD-10-CM | POA: Insufficient documentation

## 2020-10-10 DIAGNOSIS — M659 Synovitis and tenosynovitis, unspecified: Secondary | ICD-10-CM | POA: Diagnosis not present

## 2020-10-10 HISTORY — PX: ELBOW ARTHROSCOPY: SHX614

## 2020-10-10 SURGERY — ARTHROSCOPY, ELBOW, WITH OPEN SURGERY IF INDICATED
Anesthesia: General | Site: Elbow | Laterality: Left

## 2020-10-10 MED ORDER — LIDOCAINE HCL (CARDIAC) PF 100 MG/5ML IV SOSY
PREFILLED_SYRINGE | INTRAVENOUS | Status: DC | PRN
  Administered 2020-10-10: 100 mg via INTRAVENOUS

## 2020-10-10 MED ORDER — FAMOTIDINE 20 MG PO TABS: 20.0000 mg | ORAL_TABLET | Freq: Once | ORAL | Status: AC

## 2020-10-10 MED ORDER — MIDAZOLAM HCL 2 MG/2ML IJ SOLN
INTRAMUSCULAR | Status: AC
  Filled 2020-10-10: qty 2

## 2020-10-10 MED ORDER — FAMOTIDINE 20 MG PO TABS
ORAL_TABLET | ORAL | Status: AC
  Administered 2020-10-10: 20 mg via ORAL
  Filled 2020-10-10: qty 1

## 2020-10-10 MED ORDER — OXYCODONE HCL 5 MG PO TABS: 5.0000 mg | ORAL_TABLET | Freq: Once | ORAL | Status: DC | PRN

## 2020-10-10 MED ORDER — LIDOCAINE HCL (PF) 2 % IJ SOLN
INTRAMUSCULAR | Status: AC
  Filled 2020-10-10: qty 5

## 2020-10-10 MED ORDER — CEFAZOLIN SODIUM-DEXTROSE 2-4 GM/100ML-% IV SOLN
2.0000 g | INTRAVENOUS | Status: AC
  Administered 2020-10-10: 2 g via INTRAVENOUS

## 2020-10-10 MED ORDER — BUPIVACAINE-EPINEPHRINE (PF) 0.5% -1:200000 IJ SOLN
INTRAMUSCULAR | Status: DC | PRN
  Administered 2020-10-10: 15 mL via PERINEURAL
  Administered 2020-10-10: 4 mL via PERINEURAL

## 2020-10-10 MED ORDER — TRIAMCINOLONE ACETONIDE 40 MG/ML IJ SUSP
INTRAMUSCULAR | Status: AC
  Filled 2020-10-10: qty 1

## 2020-10-10 MED ORDER — CEFAZOLIN SODIUM-DEXTROSE 2-4 GM/100ML-% IV SOLN
INTRAVENOUS | Status: AC
  Filled 2020-10-10: qty 100

## 2020-10-10 MED ORDER — PROPOFOL 10 MG/ML IV BOLUS
INTRAVENOUS | Status: AC
  Filled 2020-10-10: qty 60

## 2020-10-10 MED ORDER — ONDANSETRON HCL 4 MG/2ML IJ SOLN
INTRAMUSCULAR | Status: DC | PRN
Start: 2020-10-10 — End: 2020-10-10
  Administered 2020-10-10: 4 mg via INTRAVENOUS

## 2020-10-10 MED ORDER — FENTANYL CITRATE (PF) 100 MCG/2ML IJ SOLN
INTRAMUSCULAR | Status: AC
  Filled 2020-10-10: qty 2

## 2020-10-10 MED ORDER — BUPIVACAINE-EPINEPHRINE (PF) 0.5% -1:200000 IJ SOLN
INTRAMUSCULAR | Status: AC
  Filled 2020-10-10: qty 90

## 2020-10-10 MED ORDER — KETOROLAC TROMETHAMINE 30 MG/ML IJ SOLN
INTRAMUSCULAR | Status: DC | PRN
  Administered 2020-10-10: 30 mg via INTRAVENOUS

## 2020-10-10 MED ORDER — SUCCINYLCHOLINE CHLORIDE 200 MG/10ML IV SOSY
PREFILLED_SYRINGE | INTRAVENOUS | Status: AC
  Filled 2020-10-10: qty 10

## 2020-10-10 MED ORDER — ORAL CARE MOUTH RINSE: 15.0000 mL | Freq: Once | OROMUCOSAL | Status: AC

## 2020-10-10 MED ORDER — DEXAMETHASONE SODIUM PHOSPHATE 10 MG/ML IJ SOLN
INTRAMUSCULAR | Status: DC | PRN
  Administered 2020-10-10: 8 mg via INTRAVENOUS

## 2020-10-10 MED ORDER — PHENYLEPHRINE HCL (PRESSORS) 10 MG/ML IV SOLN
INTRAVENOUS | Status: DC | PRN
  Administered 2020-10-10: 100 ug via INTRAVENOUS

## 2020-10-10 MED ORDER — ACETAMINOPHEN 10 MG/ML IV SOLN
INTRAVENOUS | Status: AC
  Filled 2020-10-10: qty 100

## 2020-10-10 MED ORDER — SODIUM CHLORIDE 0.9 % IV SOLN
INTRAVENOUS | Status: DC | PRN
  Administered 2020-10-10: 25 ug/min via INTRAVENOUS

## 2020-10-10 MED ORDER — HYDROCODONE-ACETAMINOPHEN 5-325 MG PO TABS: 1.0000 | ORAL_TABLET | Freq: Four times a day (QID) | ORAL | 0 refills | Status: DC | PRN

## 2020-10-10 MED ORDER — MIDAZOLAM HCL 2 MG/2ML IJ SOLN
INTRAMUSCULAR | Status: DC | PRN
  Administered 2020-10-10: 2 mg via INTRAVENOUS

## 2020-10-10 MED ORDER — SUCCINYLCHOLINE CHLORIDE 20 MG/ML IJ SOLN
INTRAMUSCULAR | Status: DC | PRN
  Administered 2020-10-10: 30 mg via INTRAVENOUS

## 2020-10-10 MED ORDER — LIDOCAINE HCL 1 % IJ SOLN
INTRAMUSCULAR | Status: DC | PRN
  Administered 2020-10-10: 15 mL

## 2020-10-10 MED ORDER — ONDANSETRON HCL 4 MG/2ML IJ SOLN: 4.0000 mg | Freq: Once | INTRAMUSCULAR | Status: DC | PRN

## 2020-10-10 MED ORDER — SUGAMMADEX SODIUM 200 MG/2ML IV SOLN
INTRAVENOUS | Status: DC | PRN
  Administered 2020-10-10: 200 mg via INTRAVENOUS

## 2020-10-10 MED ORDER — FENTANYL CITRATE (PF) 100 MCG/2ML IJ SOLN: 25.0000 ug | INTRAMUSCULAR | Status: DC | PRN

## 2020-10-10 MED ORDER — PROPOFOL 10 MG/ML IV BOLUS
INTRAVENOUS | Status: DC | PRN
  Administered 2020-10-10: 150 mg via INTRAVENOUS
  Administered 2020-10-10: 50 mg via INTRAVENOUS
  Administered 2020-10-10: 30 mg via INTRAVENOUS

## 2020-10-10 MED ORDER — ACETAMINOPHEN 10 MG/ML IV SOLN
INTRAVENOUS | Status: DC | PRN
  Administered 2020-10-10: 1000 mg via INTRAVENOUS

## 2020-10-10 MED ORDER — ONDANSETRON HCL 4 MG/2ML IJ SOLN
INTRAMUSCULAR | Status: AC
  Filled 2020-10-10: qty 2

## 2020-10-10 MED ORDER — LIDOCAINE HCL (PF) 1 % IJ SOLN
INTRAMUSCULAR | Status: AC
  Filled 2020-10-10: qty 30

## 2020-10-10 MED ORDER — TRIAMCINOLONE ACETONIDE 40 MG/ML IJ SUSP
INTRAMUSCULAR | Status: DC | PRN
  Administered 2020-10-10: 40 mg via INTRAMUSCULAR

## 2020-10-10 MED ORDER — DEXAMETHASONE SODIUM PHOSPHATE 10 MG/ML IJ SOLN
INTRAMUSCULAR | Status: AC
  Filled 2020-10-10: qty 1

## 2020-10-10 MED ORDER — EPINEPHRINE PF 1 MG/ML IJ SOLN
INTRAMUSCULAR | Status: AC
  Filled 2020-10-10: qty 3

## 2020-10-10 MED ORDER — ACETAMINOPHEN 10 MG/ML IV SOLN: 1000.0000 mg | Freq: Once | INTRAVENOUS | Status: DC | PRN

## 2020-10-10 MED ORDER — CHLORHEXIDINE GLUCONATE 0.12 % MT SOLN: 15.0000 mL | Freq: Once | OROMUCOSAL | Status: AC

## 2020-10-10 MED ORDER — ROCURONIUM BROMIDE 100 MG/10ML IV SOLN
INTRAVENOUS | Status: DC | PRN
  Administered 2020-10-10: 50 mg via INTRAVENOUS

## 2020-10-10 MED ORDER — LACTATED RINGERS IV SOLN: INTRAVENOUS | Status: DC

## 2020-10-10 MED ORDER — CHLORHEXIDINE GLUCONATE 0.12 % MT SOLN
OROMUCOSAL | Status: AC
  Administered 2020-10-10: 15 mL via OROMUCOSAL
  Filled 2020-10-10: qty 15

## 2020-10-10 MED ORDER — OXYCODONE HCL 5 MG/5ML PO SOLN: 5.0000 mg | Freq: Once | ORAL | Status: DC | PRN

## 2020-10-10 MED ORDER — FENTANYL CITRATE (PF) 100 MCG/2ML IJ SOLN
INTRAMUSCULAR | Status: DC | PRN
  Administered 2020-10-10 (×2): 50 ug via INTRAVENOUS

## 2020-10-10 MED ORDER — LACTATED RINGERS IV SOLN
INTRAVENOUS | Status: DC | PRN
  Administered 2020-10-10: 3000 mL

## 2020-10-10 MED ORDER — GLYCOPYRROLATE 0.2 MG/ML IJ SOLN
INTRAMUSCULAR | Status: DC | PRN
  Administered 2020-10-10: .2 mg via INTRAVENOUS

## 2020-10-10 SURGICAL SUPPLY — 47 items
APL PRP STRL LF DISP 70% ISPRP (MISCELLANEOUS) ×2
BLADE FULL RADIUS 3.5 (BLADE) ×2 IMPLANT
BNDG COHESIVE 4X5 TAN STRL (GAUZE/BANDAGES/DRESSINGS) ×2 IMPLANT
BNDG COHESIVE 6X5 TAN STRL LF (GAUZE/BANDAGES/DRESSINGS) ×2 IMPLANT
BNDG ELASTIC 4X5.8 VLCR STR LF (GAUZE/BANDAGES/DRESSINGS) ×2 IMPLANT
BNDG ESMARK 4X12 TAN STRL LF (GAUZE/BANDAGES/DRESSINGS) ×2 IMPLANT
BNDG GAUZE 4.5X4.1 6PLY STRL (MISCELLANEOUS) ×2 IMPLANT
BUR ABRADER 4.0 W/FLUTE AQUA (MISCELLANEOUS) ×1 IMPLANT
BURR ABRADER 4.0 W/FLUTE AQUA (MISCELLANEOUS) ×2
CANNULA SHAVER SCOPE W/RIB (CANNULA) ×2 IMPLANT
CHLORAPREP W/TINT 26 (MISCELLANEOUS) ×4 IMPLANT
COVER WAND RF STERILE (DRAPES) ×2 IMPLANT
CUFF TOURN SGL QUICK 18X4 (TOURNIQUET CUFF) ×2 IMPLANT
CUFF TOURN SGL QUICK 24 (TOURNIQUET CUFF)
CUFF TOURN SGL QUICK 30 (TOURNIQUET CUFF)
CUFF TRNQT CYL 24X4X16.5-23 (TOURNIQUET CUFF) IMPLANT
CUFF TRNQT CYL 30X4X21-28X (TOURNIQUET CUFF) IMPLANT
DRAPE 3/4 80X56 (DRAPES) ×4 IMPLANT
DRAPE INCISE IOBAN 66X45 STRL (DRAPES) ×2 IMPLANT
DRAPE U-SHAPE 47X51 STRL (DRAPES) ×2 IMPLANT
ELECT REM PT RETURN 9FT ADLT (ELECTROSURGICAL) ×2
ELECTRODE REM PT RTRN 9FT ADLT (ELECTROSURGICAL) ×1 IMPLANT
GAUZE SPONGE 4X4 12PLY STRL (GAUZE/BANDAGES/DRESSINGS) ×2 IMPLANT
GLOVE INDICATOR 8.0 STRL GRN (GLOVE) ×2 IMPLANT
GLOVE SURG ENC MOIS LTX SZ8 (GLOVE) ×4 IMPLANT
GOWN STRL REUS W/ TWL LRG LVL3 (GOWN DISPOSABLE) ×2 IMPLANT
GOWN STRL REUS W/ TWL XL LVL3 (GOWN DISPOSABLE) ×2 IMPLANT
GOWN STRL REUS W/TWL LRG LVL3 (GOWN DISPOSABLE) ×4
GOWN STRL REUS W/TWL XL LVL3 (GOWN DISPOSABLE) ×4
IV LACTATED RINGER IRRG 3000ML (IV SOLUTION) ×4
IV LR IRRIG 3000ML ARTHROMATIC (IV SOLUTION) ×2 IMPLANT
KIT TURNOVER KIT A (KITS) ×2 IMPLANT
MANIFOLD NEPTUNE II (INSTRUMENTS) ×4 IMPLANT
MAT ABSORB  FLUID 56X50 GRAY (MISCELLANEOUS) ×1
MAT ABSORB FLUID 56X50 GRAY (MISCELLANEOUS) ×1 IMPLANT
NS IRRIG 1000ML POUR BTL (IV SOLUTION) ×2 IMPLANT
PACK ARTHROSCOPY KNEE (MISCELLANEOUS) ×2 IMPLANT
PAD ABD DERMACEA PRESS 5X9 (GAUZE/BANDAGES/DRESSINGS) IMPLANT
SLING ARM LRG DEEP (SOFTGOODS) ×2 IMPLANT
SLING ARM M TX990204 (SOFTGOODS) IMPLANT
STOCKINETTE M/LG 89821 (MISCELLANEOUS) ×2 IMPLANT
SUT PROLENE 4 0 PS 2 18 (SUTURE) ×4 IMPLANT
SYR 10ML LL (SYRINGE) ×2 IMPLANT
SYR 3ML LL SCALE MARK (SYRINGE) ×2 IMPLANT
TUBING ARTHRO INFLOW-ONLY STRL (TUBING) ×2 IMPLANT
TUBING CONNECTING 10 (TUBING) ×2 IMPLANT
WAND WEREWOLF FLOW 90D (MISCELLANEOUS) ×2 IMPLANT

## 2020-10-10 NOTE — Anesthesia Preprocedure Evaluation (Addendum)
Anesthesia Evaluation  Patient identified by MRN, date of birth, ID band Patient awake    Reviewed: Allergy & Precautions, NPO status , Patient's Chart, lab work & pertinent test results  History of Anesthesia Complications Negative for: history of anesthetic complications  Airway Mallampati: II  TM Distance: >3 FB Neck ROM: Full    Dental  (+) Missing, Poor Dentition,    Pulmonary neg sleep apnea, neg COPD, Current Smoker and Patient abstained from smoking.,    Pulmonary exam normal breath sounds clear to auscultation       Cardiovascular Exercise Tolerance: Good METS(-) hypertension(-) CAD and (-) Past MI negative cardio ROS  (-) dysrhythmias  Rhythm:Regular Rate:Normal - Systolic murmurs    Neuro/Psych PSYCHIATRIC DISORDERS Anxiety Schizophrenia negative neurological ROS     GI/Hepatic neg GERD  ,(+)     (-) substance abuse  ,   Endo/Other  neg diabetes  Renal/GU negative Renal ROS     Musculoskeletal  (+) Arthritis , Osteoarthritis,    Abdominal   Peds  Hematology   Anesthesia Other Findings Past Medical History: No date: Anxiety No date: Arthritis No date: GERD (gastroesophageal reflux disease)     Comment:  H/O No date: Hyperlipidemia No date: Schizophrenia (HCC)  Reproductive/Obstetrics                             Anesthesia Physical Anesthesia Plan  ASA: II  Anesthesia Plan: General   Post-op Pain Management:    Induction: Intravenous  PONV Risk Score and Plan: 1 and Ondansetron, Dexamethasone and Midazolam  Airway Management Planned: LMA  Additional Equipment: None  Intra-op Plan:   Post-operative Plan: Extubation in OR  Informed Consent: I have reviewed the patients History and Physical, chart, labs and discussed the procedure including the risks, benefits and alternatives for the proposed anesthesia with the patient or authorized representative who has  indicated his/her understanding and acceptance.     Dental advisory given  Plan Discussed with: CRNA and Surgeon  Anesthesia Plan Comments: (Discussed risks of anesthesia with patient, including PONV, sore throat, lip/dental damage. Rare risks discussed as well, such as cardiorespiratory and neurological sequelae. Patient understands. Patient counseled on benefits of smoking cessation, and increased perioperative risks associated with continued smoking. Patient has listed allergy to PCN - itching Severe blistering skin reaction (SJS/TEN)? no Liver or kidney injury caused by PCN? no Hemolytic anemia from PCN? no Drug fever? no Painful swollen joints? no Severe reaction involving inside of mouth, eye, or genital ulcers? no Based on current evidence Elizebeth Koller et al, J Allergy Clin Immunol Pract, 2019), will proceed with cefazolin use: Yes   )       Anesthesia Quick Evaluation

## 2020-10-10 NOTE — Transfer of Care (Signed)
Immediate Anesthesia Transfer of Care Note  Patient: Justin Nixon  Procedure(s) Performed: LEFT ELBOW ARTHROSCOPY WITH DEBRIDEMENT AND EXCISION OF LOOSE BODIES (Left Elbow)  Patient Location: PACU  Anesthesia Type:General  Level of Consciousness: awake, drowsy and patient cooperative  Airway & Oxygen Therapy: Patient Spontanous Breathing and Patient connected to face mask oxygen  Post-op Assessment: Report given to RN and Post -op Vital signs reviewed and stable  Post vital signs: Reviewed and stable  Last Vitals:  Vitals Value Taken Time  BP 136/84 10/10/20 1230  Temp 36 C 10/10/20 1225  Pulse 70 10/10/20 1234  Resp 16 10/10/20 1234  SpO2 95 % 10/10/20 1234  Vitals shown include unvalidated device data.  Last Pain:  Vitals:   10/10/20 1230  TempSrc:   PainSc: 0-No pain         Complications: No complications documented.

## 2020-10-10 NOTE — Anesthesia Procedure Notes (Signed)
Procedure Name: Intubation Date/Time: 10/10/2020 10:30 AM Performed by: Henrietta Hoover, CRNA Pre-anesthesia Checklist: Patient identified, Emergency Drugs available, Suction available and Patient being monitored Patient Re-evaluated:Patient Re-evaluated prior to induction Oxygen Delivery Method: Circle system utilized Preoxygenation: Pre-oxygenation with 100% oxygen Induction Type: IV induction and Cricoid Pressure applied Ventilation: Mask ventilation without difficulty Laryngoscope Size: McGraph and 4 Grade View: Grade I Tube type: Oral Tube size: 7.0 mm Number of attempts: 1 Airway Equipment and Method: Stylet and Video-laryngoscopy Placement Confirmation: ETT inserted through vocal cords under direct vision,  positive ETCO2 and breath sounds checked- equal and bilateral Secured at: 22 cm Tube secured with: Tape Dental Injury: Teeth and Oropharynx as per pre-operative assessment

## 2020-10-10 NOTE — H&P (Signed)
History of Present Illness:  Justin Nixon is a 45 y.o. male who presents for follow-up of his lateral and postero-lateral left elbow pain with intermittent catching/locking. The patient was last seen for the symptoms 3 weeks ago. At this visit, he received a steroid injection into the left elbow joint which he states provided little if any relief of his symptoms. He continues to experience mild to moderate pain over the lateral aspect of his elbow, as well as intermittent locking/catching of his elbow. In fact, he notes that his elbow locked up on him just yesterday and only released with a noticeable "pop". He has been wearing his Velcro wrist immobilizer on a regular basis, and taking Lodine daily with limited benefit. He denies any reinjury to the elbow since his last visit, and denies any numbness or paresthesias down his arm to his hand.  Current Outpatient Medications: . doxepin HCl (DOXEPIN ORAL) Take 1 tablet by mouth nightly  . etodolac (LODINE) 500 MG tablet TAKE 1 TABLET BY MOUTH TWICE A DAY 60 tablet 3  . LATUDA 20 mg tablet TAKE 1 TABLET BY MOUTH EVERY DAY WITH FOOD  . multivit-minerals/FA/lycopene (ONE-A-DAY MEN'S ORAL) Take 1 tablet by mouth once daily  . lisinopriL (ZESTRIL) 2.5 MG tablet Take 1 tablet (2.5 mg total) by mouth once daily (Patient not taking: Reported on 07/11/2020 ) 30 tablet 11   Allergies:  . Penicillins Itching   Past Medical History:  . Hyperlipidemia with history of High Cholesterol   Past Surgical History:  . Cyst removed from under left arm  . Limited arthroscopic debridement arthroscopci subacromial decompression and arthroscopic distal clavicle excision,left shoulder Left 03/18/2017 (Dr.Candence Sease)  . Limited arthroscopic debridement, arthroscopic subacromial decompression, and mini-open rotator cuff repair, right shoulder Right 09/23/2016 (Dr.Kariem Wolfson)  . MYRINGOTOMY & TUBES   Family History  Problem Relation Age of Onset  . High blood pressure  (Hypertension) Mother  . Arthritis Mother  . Heart murmur Mother  . No Known Problems Father   Social History:   Socioeconomic History:  Marland Kitchen Marital status: Married  Spouse name: Not on file  . Number of children: Not on file  . Years of education: Not on file  . Highest education level: Not on file  Occupational History  . Not on file  Tobacco Use  . Smoking status: Current Every Day Smoker  Packs/day: 0.50  Types: Cigarettes  . Smokeless tobacco: Never Used  Vaping Use  . Vaping Use: Never used  Substance and Sexual Activity  . Alcohol use: Not Currently  . Drug use: No  . Sexual activity: Yes  Partners: Female  Other Topics Concern  . Not on file  Social History Narrative  . Not on file   Social Determinants of Health:   Financial Resource Strain: Not on file  Food Insecurity: Not on file  Transportation Needs: Not on file   Review of Systems:  A comprehensive 14 point ROS was performed, reviewed, and the pertinent orthopaedic findings are documented in the HPI.  Physical Exam: Vitals:  09/21/20 1010  BP: 124/88  Weight: 67.7 kg (149 lb 3.2 oz)  Height: 182.9 cm (6')  PainSc: 10-Worst pain ever  PainLoc: Elbow   General/Constitutional: The patient appears to be well-nourished, well-developed, and in no acute distress. Neuro/Psych: Normal mood and affect, oriented to person, place and time.  Eyes: Non-icteric. Pupils are equal, round, and reactive to light, and exhibit synchronous movement. ENT: Unremarkable. Lymphatic: No palpable adenopathy. Respiratory: Lungs clear to auscultation,  Normal chest excursion, No wheezes and Non-labored breathing Cardiovascular: Regular rate and rhythm. No murmurs. and No edema, swelling or tenderness, except as noted in detailed exam. Integumentary: No impressive skin lesions present, except as noted in detailed exam. Musculoskeletal: Unremarkable, except as noted in detailed exam.  Left elbow exam: Skin inspection of the  left elbow again is unremarkable. No swelling, erythema, ecchymosis, abrasions, or other skin abnormalities are identified. There is mild-moderate tenderness to palpation over the common extensor origin over the lateral aspect of his elbow. He again describes mild tenderness to palpation over the postero-lateral aspect of the elbow. No swelling, erythema, ecchymosis, abrasions, or other skin abnormalities are identified. There is at most a trace elbow effusion. He exhibits full active and passive range of motion of the elbow. However, there is an intermittent popping/"catching" sensation on today's examination. He denies any pain with resisted elbow flexion or with resisted elbow extension. He experiences mild-moderate discomfort over the lateral aspect of the elbow with resisted wrist extension, as well as more mild soreness over the medial aspect of the elbow with resisted wrist flexion with the elbow extended. There also is mild pain with resisted long finger extension. He remains neurovascularly intact to the left forearm and hand.  Assessment: 1. Chondromalacia of left elbow.  2. Lateral epicondylitis, left elbow.   Plan: The treatment options were discussed with the patient and his friend. In addition, patient educational materials were provided regarding the diagnosis and treatment options. The patient is quite frustrated by his symptoms and functional limitations, and is ready to consider more aggressive treatment options. Based on examination today, as well as the MRI findings noted previously, I do feel that there are some cartilaginous loose bodies within the elbow that are contributing to his symptoms. Therefore, I have recommended a surgical procedure, specifically a left elbow arthroscopy with debridement and removal of any cartilaginous loose bodies, along with a steroid injection into the lateral epicondylar region. The procedure was discussed with the patient, as were the potential risks  (including bleeding, infection, nerve and/or blood vessel injury, persistent or recurrent pain, persistent or recurrent catching of the elbow, decreased motion of the elbow, need for further surgery, blood clots, strokes, heart attacks and/or arhythmias, pneumonia, etc.) and benefits. The patient states his understanding and wishes to proceed. All of the patient's questions and concerns were answered. He can call any time with further concerns. He will follow up post-surgery, routine.   H&P reviewed and patient re-examined. No changes.

## 2020-10-10 NOTE — Discharge Instructions (Addendum)
Orthopedic discharge instructions: Keep dressing dry and intact. Keep hand elevated above heart level. May shower after dressing removed on postop day 4 (Sunday). Cover sutures with Band-Aids after drying off. Apply ice to affected area frequently. Take Lodine 500 mg BID OR ibuprofen 600-800 mg TID with meals for 7-10 days, then as necessary. Take ES Tylenol or pain medication as prescribed when needed.  Return for follow-up in 10-14 days or as scheduled.   AMBULATORY SURGERY  DISCHARGE INSTRUCTIONS   1) The drugs that you were given will stay in your system until tomorrow so for the next 24 hours you should not:  A) Drive an automobile B) Make any legal decisions C) Drink any alcoholic beverage   2) You may resume regular meals tomorrow.  Today it is better to start with liquids and gradually work up to solid foods.  You may eat anything you prefer, but it is better to start with liquids, then soup and crackers, and gradually work up to solid foods.   3) Please notify your doctor immediately if you have any unusual bleeding, trouble breathing, redness and pain at the surgery site, drainage, fever, or pain not relieved by medication.    4) Additional Instructions:        Please contact your physician with any problems or Same Day Surgery at [phone], Monday through Friday 6 am to 4 pm, or [place] at [facility] number at [phone].    Heart-Healthy Eating Plan Heart-healthy meal planning includes:  Eating less unhealthy fats.  Eating more healthy fats.  Making other changes in your diet. Talk with your doctor or a diet specialist (dietitian) to create an eating plan that is right for you. What is my plan? Your doctor may recommend an eating plan that includes:  Total fat: ______% or less of total calories a day.  Saturated fat: ______% or less of total calories a day.  Cholesterol: less than _________mg a day. What are tips for following this  plan? Cooking Avoid frying your food. Try to bake, boil, grill, or broil it instead. You can also reduce fat by:  Removing the skin from poultry.  Removing all visible fats from meats.  Steaming vegetables in water or broth. Meal planning  At meals, divide your plate into four equal parts: ? Fill one-half of your plate with vegetables and green salads. ? Fill one-fourth of your plate with whole grains. ? Fill one-fourth of your plate with lean protein foods.  Eat 4-5 servings of vegetables per day. A serving of vegetables is: ? 1 cup of raw or cooked vegetables. ? 2 cups of raw leafy greens.  Eat 4-5 servings of fruit per day. A serving of fruit is: ? 1 medium whole fruit. ?  cup of dried fruit. ?  cup of fresh, frozen, or canned fruit. ?  cup of 100% fruit juice.  Eat more foods that have soluble fiber. These are apples, broccoli, carrots, beans, peas, and barley. Try to get 20-30 g of fiber per day.  Eat 4-5 servings of nuts, legumes, and seeds per week: ? 1 serving of dried beans or legumes equals  cup after being cooked. ? 1 serving of nuts is  cup. ? 1 serving of seeds equals 1 tablespoon.   General information  Eat more home-cooked food. Eat less restaurant, buffet, and fast food.  Limit or avoid alcohol.  Limit foods that are high in starch and sugar.  Avoid fried foods.  Lose weight if you  are overweight.  Keep track of how much salt (sodium) you eat. This is important if you have high blood pressure. Ask your doctor to tell you more about this.  Try to add vegetarian meals each week. Fats  Choose healthy fats. These include olive oil and canola oil, flaxseeds, walnuts, almonds, and seeds.  Eat more omega-3 fats. These include salmon, mackerel, sardines, tuna, flaxseed oil, and ground flaxseeds. Try to eat fish at least 2 times each week.  Check food labels. Avoid foods with trans fats or high amounts of saturated fat.  Limit saturated  fats. ? These are often found in animal products, such as meats, butter, and cream. ? These are also found in plant foods, such as palm oil, palm kernel oil, and coconut oil.  Avoid foods with partially hydrogenated oils in them. These have trans fats. Examples are stick margarine, some tub margarines, cookies, crackers, and other baked goods. What foods can I eat? Fruits All fresh, canned (in natural juice), or frozen fruits. Vegetables Fresh or frozen vegetables (raw, steamed, roasted, or grilled). Green salads. Grains Most grains. Choose whole wheat and whole grains most of the time. Rice and pasta, including brown rice and pastas made with whole wheat. Meats and other proteins Lean, well-trimmed beef, veal, pork, and lamb. Chicken and [place] without skin. All fish and shellfish. Wild duck, rabbit, pheasant, and venison. Egg whites or low-cholesterol egg substitutes. Dried beans, peas, lentils, and tofu. Seeds and most nuts. Dairy Low-fat or nonfat cheeses, including ricotta and mozzarella. Skim or 1% milk that is liquid, powdered, or evaporated. Buttermilk that is made with low-fat milk. Nonfat or low-fat yogurt. Fats and oils Non-hydrogenated (trans-free) margarines. Vegetable oils, including soybean, sesame, sunflower, olive, peanut, safflower, corn, canola, and cottonseed. Salad dressings or mayonnaise made with a vegetable oil. Beverages Mineral water. Coffee and tea. Diet carbonated beverages. Sweets and desserts Sherbet, gelatin, and fruit ice. Small amounts of dark chocolate. Limit all sweets and desserts. Seasonings and condiments All seasonings and condiments. The items listed above may not be a complete list of foods and drinks you can eat. Contact a dietitian for more options. What foods should I avoid? Fruits Canned fruit in heavy syrup. Fruit in cream or butter sauce. Fried fruit. Limit coconut. Vegetables Vegetables cooked in cheese, cream, or butter sauce. Fried  vegetables. Grains Breads that are made with saturated or trans fats, oils, or whole milk. Croissants. Sweet rolls. Donuts. High-fat crackers, such as cheese crackers. Meats and other proteins Fatty meats, such as hot dogs, ribs, sausage, bacon, rib-eye roast or steak. High-fat deli meats, such as salami and bologna. Caviar. Domestic duck and goose. Organ meats, such as liver. Dairy Cream, sour cream, cream cheese, and creamed cottage cheese. Whole-milk cheeses. Whole or 2% milk that is liquid, evaporated, or condensed. Whole buttermilk. Cream sauce or high-fat cheese sauce. Yogurt that is made from whole milk. Fats and oils Meat fat, or shortening. Cocoa butter, hydrogenated oils, palm oil, coconut oil, palm kernel oil. Solid fats and shortenings, including bacon fat, salt pork, lard, and butter. Nondairy cream substitutes. Salad dressings with cheese or sour cream. Beverages Regular sodas and juice drinks with added sugar. Sweets and desserts Frosting. Pudding. Cookies. Cakes. Pies. Milk chocolate or white chocolate. Buttered syrups. Full-fat ice cream or ice cream drinks. The items listed above may not be a complete list of foods and drinks to avoid. Contact a dietitian for more information. Summary  Heart-healthy meal planning includes eating less unhealthy fats,  eating more healthy fats, and making other changes in your diet.  Eat a balanced diet. This includes fruits and vegetables, low-fat or nonfat dairy, lean protein, nuts and legumes, whole grains, and heart-healthy oils and fats. This information is not intended to replace advice given to you by your health care provider. Make sure you discuss any questions you have with your health care provider. Document Revised: 09/24/2017 Document Reviewed: 08/28/2017 Elsevier Patient Education  2021 [employer].

## 2020-10-10 NOTE — Op Note (Signed)
10/10/2020  12:36 PM  Patient:   Justin Nixon  Pre-Op Diagnosis:   1. 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  2. Chronic lateral epicondylitis, left elbow.  Post-Op Diagnosis:   1. Symptomatic lateral plica, left elbow. 2. Chronic lateral epicondylitis, left elbow.  Procedure:   1. Arthroscopic debridement with excision of symptomatic plica, left elbow.  2. Steroid injection into common extensor origin, left elbow.  Surgeon:   Maryagnes Amos, MD  Assistant:   Griffin Basil, RNFA; Arcola Jansky, PA-S  Anesthesia:   GET  Findings:   As above.  There was a small area of articular cartilage blistering on the anterior aspect of the humerus.  Otherwise, the articular surfaces of the humerus, radial head, and ulna all appear to be in satisfactory condition.  Complications:   None  EBL:   2 cc  Fluids:   900 cc crystalloid  TT:   65 minutes at 250 mmHg  Drains:   None  Closure:   4-0 Prolene interrupted sutures  IBrief Clinical Note:   The patient is a 45 year old male with a history of intermittent painful catching of his left elbow following a fall onto his flexed elbow. His symptoms have persisted despite medications, activity modification, injections, etc. His history and examination are consistent with possible loose bodies in his elbow. His preoperative MRI scan suggested focal areas of articular cartilage damage, suggesting that he may have had cartilaginous loose bodies causing his symptoms.  The patient presents at this time for a left elbow arthroscopy with debridement and excision of any loose bodies.  The patient also complains of recurrent lateral sided left elbow pain.  His history and examination are consistent with chronic lateral epicondylitis.  His preoperative MRI scan did demonstrate some areas of degenerative changes in the common extensor origin.  He will have this area injected with a steroid preparation at this  time.  Procedure:   The patient was brought into the operating room and lain in the supine position. After adequate general endotracheal intubation and anesthesia was obtained, the patient was repositioned in the lateral position using the beanbag positioner with care taken to be sure the chest, hip, and knees were well padded and that the neck was also positioned appropriately. A tourniquet was placed around the upper arm before the arm was secured to the arm holder using a 4 inch Coban. After verifying the appropriate surgical site with a timeout, 30 cc of 0.5% Sensorcaine with epinephrine was injected into the left elbow joint using sterile technique. The left upper extremity was prepped with ChloraPrep solution before being draped sterilely. Preoperative antibiotics were administered. The limb was exsanguinated with an Esmarch and the tourniquet inflated to 250 mmHg.   A proximal anteromedial portal was created using an outside-in technique at a spot 2 cm proximal and 1 cm anterior to the medial epicondyle. The skin was incised with a #15 blade before blunt dissection was carried down through the subcutaneous tissues using a hemostat. The intra-articular aspect of the elbow was carefully inspected with the findings as described above. A separate anterolateral portal was created 2 cm proximal and 1 center anterior to the lateral epicondyle using an outside-in technique. The anterior aspect of the elbow was thoroughly examined with the findings as described above. Areas of synovitis were debrided using the full-radius resector, as was what appeared to be a thickened plica over the anterior lateral portion of the radial head. The instruments were then  removed from the anterior compartment.  A posterolateral portal was created 3 cm proximal to the tip of the olecranon along the lateral border of the triceps tendon before the camera was inserted. A second posterolateral portal was created in the midline  approximately 3 to 4 cm proximal to the tip of the olecranon. The 3.5 mm full-radius resector was inserted through the posterolateral portal site and synovial tissues debrided from the olecranon fossa as well as along the posterolateral gutter, removing the more posterior portion of the plica in the process. The camera was then placed in the posterolateral portal and the shaver introduced through a more distal posterolateral portal to better access the distal portion of the posterolateral gutter. Additional debridement was carried out to complete removal of the presumed symptomatic plica. The instruments were removed from the posterior compartment after suctioning the excess fluid.   The portal sites were reapproximated using 4-0 Prolene interrupted sutures before a sterile bulky dressingwas applied to the elbow. In addition, the common extensor origin was injected using a solution of 0.5 cc of Kenalog-40 and 4.5 cc of 0.5% Sensorcaine. The patient was rolled back in the supine position on his stretcher before arm was placed into a standard sling for comfort. Subsequently, the patient was awakened, extubated, and returned to the recovery room in satisfactory condition after tolerating the procedure well.

## 2020-10-10 NOTE — Anesthesia Postprocedure Evaluation (Signed)
Anesthesia Post Note  Patient: Justin Nixon  Procedure(s) Performed: LEFT ELBOW ARTHROSCOPY WITH DEBRIDEMENT AND EXCISION OF LOOSE BODIES (Left Elbow)  Patient location during evaluation: PACU Anesthesia Type: General Level of consciousness: awake and alert Pain management: pain level controlled Vital Signs Assessment: post-procedure vital signs reviewed and stable Respiratory status: spontaneous breathing, nonlabored ventilation, respiratory function stable and patient connected to nasal cannula oxygen Cardiovascular status: blood pressure returned to baseline and stable Postop Assessment: no apparent nausea or vomiting Anesthetic complications: no   No complications documented.   Last Vitals:  Vitals:   10/10/20 1315 10/10/20 1323  BP: (!) 139/98 (!) 142/101  Pulse: 62 70  Resp: 14 16  Temp:  (!) 36.2 C  SpO2: 97% 99%    Last Pain:  Vitals:   10/10/20 1323  TempSrc: Temporal  PainSc: 0-No pain                 Corinda Gubler

## 2020-10-11 ENCOUNTER — Encounter: Payer: Self-pay | Admitting: Surgery

## 2020-10-11 DIAGNOSIS — M67822 Other specified disorders of synovium, left elbow: Secondary | ICD-10-CM | POA: Insufficient documentation

## 2021-07-08 ENCOUNTER — Other Ambulatory Visit: Payer: Self-pay

## 2021-07-08 ENCOUNTER — Emergency Department
Admission: EM | Admit: 2021-07-08 | Discharge: 2021-07-08 | Disposition: A | Discharge status: Home / Self Care | Payer: Medicare Other | Attending: Emergency Medicine | Admitting: Emergency Medicine

## 2021-07-08 ENCOUNTER — Encounter: Payer: Self-pay | Admitting: Emergency Medicine

## 2021-07-08 DIAGNOSIS — M5416 Radiculopathy, lumbar region: Secondary | ICD-10-CM | POA: Insufficient documentation

## 2021-07-08 DIAGNOSIS — M545 Low back pain, unspecified: Secondary | ICD-10-CM | POA: Diagnosis present

## 2021-07-08 DIAGNOSIS — F17210 Nicotine dependence, cigarettes, uncomplicated: Secondary | ICD-10-CM | POA: Diagnosis not present

## 2021-07-08 MED ORDER — PREDNISONE 10 MG (21) PO TBPK: ORAL_TABLET | ORAL | 0 refills | Status: DC

## 2021-07-08 MED ORDER — HYDROCODONE-ACETAMINOPHEN 5-325 MG PO TABS: 1.0000 | ORAL_TABLET | Freq: Four times a day (QID) | ORAL | 0 refills | Status: AC | PRN

## 2021-07-08 MED ORDER — HYDROCODONE-ACETAMINOPHEN 5-325 MG PO TABS: 1.0000 | ORAL_TABLET | Freq: Four times a day (QID) | ORAL | 0 refills | Status: DC | PRN

## 2021-07-08 MED ORDER — PREDNISONE 20 MG PO TABS
60.0000 mg | ORAL_TABLET | Freq: Once | ORAL | Status: AC
  Administered 2021-07-08: 60 mg via ORAL
  Filled 2021-07-08: qty 3

## 2021-07-08 MED ORDER — HYDROCODONE-ACETAMINOPHEN 5-325 MG PO TABS
1.0000 | ORAL_TABLET | Freq: Once | ORAL | Status: AC
Start: 2021-07-08 — End: 2021-07-08
  Administered 2021-07-08: 1 via ORAL
  Filled 2021-07-08: qty 1

## 2021-07-08 MED ORDER — ONDANSETRON 4 MG PO TBDP
4.0000 mg | ORAL_TABLET | Freq: Once | ORAL | Status: AC
  Administered 2021-07-08: 4 mg via ORAL
  Filled 2021-07-08: qty 1

## 2021-07-08 NOTE — ED Triage Notes (Signed)
Pt via POV from home. Pt c/o R hip pain for the past 10 weeks. Pt has had a MRI and has a pinched nerve and waiting on them to do injections. Pt states that he has been waiting too long. Pt is A&OX4 and NAD.

## 2021-07-08 NOTE — ED Provider Notes (Signed)
[facility]-EMERGENCY DEPARTMENT  ____________________________________________  Time seen: Approximately 4:42 PM  I have reviewed the triage vital signs and the nursing notes.   HISTORY  Chief Complaint Hip Pain   Historian Patient     HPI Justin Nixon is a 45 y.o. male presents to the emergency department with concern for low back pain and right hip pain.  Patient states that he has both an MRI of his hip and his low back and has been diagnosed with "pinched nerves".  Patient states that he does not have a follow-up appointment with orthopedics.  He states he had an ultrasound-guided intra-articular injection which did not bring him significant pain relief he has attempted no other alleviating measures at home.  He has had no new falls or mechanisms of trauma.  No bowel or bladder incontinence or saddle anesthesia.   Past Medical History:  Diagnosis Date   Anxiety    Arthritis    GERD (gastroesophageal reflux disease)    H/O   Hyperlipidemia    Schizophrenia (HCC)      Immunizations up to date:  Yes.     Past Medical History:  Diagnosis Date   Anxiety    Arthritis    GERD (gastroesophageal reflux disease)    H/O   Hyperlipidemia    Schizophrenia (HCC)     There are no problems to display for this patient.   Past Surgical History:  Procedure Laterality Date   COLONOSCOPY     CYST EXCISION     ELBOW ARTHROSCOPY Left 10/10/2020   Procedure: LEFT ELBOW ARTHROSCOPY WITH DEBRIDEMENT AND EXCISION OF LOOSE BODIES;  Surgeon: Christena Flake, MD;  Location: [facility];  Service: Orthopedics;  Laterality: Left;   MYRINGOTOMY WITH TUBE PLACEMENT     SHOULDER ARTHROSCOPY Left 03/18/2017   Procedure: ARTHROSCOPY SHOULDER with debridement decompression and distal clavicle excision;  Surgeon: Christena Flake, MD;  Location: [facility];  Service: Orthopedics;  Laterality: Left;   SHOULDER ARTHROSCOPY WITH OPEN ROTATOR CUFF REPAIR Right 09/23/2016   Procedure: SHOULDER  ARTHROSCOPY WITH OPEN ROTATOR CUFF REPAIR;  Surgeon: Christena Flake, MD;  Location: [facility];  Service: Orthopedics;  Laterality: Right;   SHOULDER ARTHROSCOPY WITH SUBACROMIAL DECOMPRESSION Right 09/23/2016   Procedure: SHOULDER ARTHROSCOPY WITH SUBACROMIAL DECOMPRESSION AND DEBRIDEMENT;  Surgeon: Christena Flake, MD;  Location: [facility];  Service: Orthopedics;  Laterality: Right;    Prior to Admission medications   Medication Sig Start Date End Date Taking? Authorizing Provider  etodolac (LODINE) 500 MG tablet Take 500 mg by mouth 2 (two) times daily. 07/04/16   [provider]  HYDROcodone-acetaminophen (NORCO) 5-325 MG tablet Take 1 tablet by mouth every 6 (six) hours as needed for up to 3 days. 07/08/21 07/11/21  Orvil Feil, PA-C  Multiple Vitamin (MULTIVITAMIN WITH MINERALS) TABS tablet Take 1 tablet by mouth daily.     [provider]  predniSONE (STERAPRED UNI-PAK 21 TAB) 10 MG (21) TBPK tablet 6,6,5,5,4,4,3,3,2,2,1,1 07/08/21   Pia Mau M, PA-C  vitamin B-12 (CYANOCOBALAMIN) 500 MCG tablet Take 500 mcg by mouth daily.    [provider]    Allergies Penicillins  History reviewed. No pertinent family history.  Social History Social History   Tobacco Use   Smoking status: Every Day    Packs/day: 0.50    Years: 22.00    Pack years: 11.00    Types: Cigarettes   Smokeless tobacco: Never  Vaping Use   Vaping Use: Never used  Substance  Use Topics   Alcohol use: Yes    Comment: OCC   Drug use: No     Review of Systems  Constitutional: No fever/chills Eyes:  No discharge ENT: No upper respiratory complaints. Respiratory: no cough. No SOB/ use of accessory muscles to breath Gastrointestinal:   No nausea, no vomiting.  No diarrhea.  No constipation. Musculoskeletal: Patient has low back pain and right hip pain.  Skin: Negative for rash, abrasions, lacerations, ecchymosis.   ____________________________________________   PHYSICAL  EXAM:  VITAL SIGNS: ED Triage Vitals  Enc Vitals Group     BP 07/08/21 1509 (!) 159/113     Pulse Rate 07/08/21 1509 (!) 105     Resp 07/08/21 1509 20     Temp 07/08/21 1509 98 F (36.7 C)     Temp Source 07/08/21 1509 Oral     SpO2 07/08/21 1509 97 %     Weight 07/08/21 1510 149 lb (67.6 kg)     Height 07/08/21 1510 6' (1.829 m)     Head Circumference --      Peak Flow --      Pain Score 07/08/21 1510 10     Pain Loc --      Pain Edu? --      Excl. in [place]? --      Constitutional: Alert and oriented. Well appearing and in no acute distress. Eyes: Conjunctivae are normal. PERRL. EOMI. Head: Atraumatic. ENT: Cardiovascular: Normal rate, regular rhythm. Normal S1 and S2.  Good peripheral circulation. Respiratory: Normal respiratory effort without tachypnea or retractions. Lungs CTAB. Good air entry to the bases with no decreased or absent breath sounds Gastrointestinal: Bowel sounds x 4 quadrants. Soft and nontender to palpation. No guarding or rigidity. No distention. Musculoskeletal: Patient has symmetric strength in the upper and lower extremities. Full range of motion to all extremities. No obvious deformities noted Neurologic:  Normal for age. No gross focal neurologic deficits are appreciated.  Skin:  Skin is warm, dry and intact. No rash noted. Psychiatric: Mood and affect are normal for age. Speech and behavior are normal.   ____________________________________________   LABS (all labs ordered are listed, but only abnormal results are displayed)  Labs Reviewed - No data to display ____________________________________________  EKG   ____________________________________________  RADIOLOGY   No results found.  ____________________________________________    PROCEDURES  Procedure(s) performed:     Procedures     Medications  HYDROcodone-acetaminophen (NORCO/VICODIN) 5-325 MG per tablet 1 tablet (has no administration in time range)  ondansetron  (ZOFRAN-ODT) disintegrating tablet 4 mg (has no administration in time range)  predniSONE (DELTASONE) tablet 60 mg (has no administration in time range)     ____________________________________________   INITIAL IMPRESSION / ASSESSMENT AND PLAN / ED COURSE  Pertinent labs & imaging results that were available during my care of the patient were reviewed by me and considered in my medical decision making (see chart for details).      Assessment and plan:  Low back pain Right hip pain 45 year old male presents to the emergency department with right hip pain and low back pain from "pinched nerves".  Patient was evaluated in the emergency department.  He has been able to bear weight and symmetric strength in the upper and lower extremities.  We will treat patient with tapered prednisone and a short course of Norco with follow-up with neurosurgery as needed.  All patient questions were answered.    ____________________________________________  FINAL CLINICAL IMPRESSION(S) / ED DIAGNOSES  Final diagnoses:  Lumbar radiculopathy      NEW MEDICATIONS STARTED DURING THIS VISIT:  ED Discharge Orders          Ordered    HYDROcodone-acetaminophen (NORCO) 5-325 MG tablet  Every 6 hours PRN,   Status:  Discontinued        07/08/21 1632    predniSONE (STERAPRED UNI-PAK 21 TAB) 10 MG (21) TBPK tablet  Status:  Discontinued        07/08/21 1632    HYDROcodone-acetaminophen (NORCO) 5-325 MG tablet  Every 6 hours PRN        07/08/21 1633    predniSONE (STERAPRED UNI-PAK 21 TAB) 10 MG (21) TBPK tablet        07/08/21 1633                This chart was dictated using voice recognition software/Dragon. Despite best efforts to proofread, errors can occur which can change the meaning. Any change was purely unintentional.     Orvil Feil, PA-C 07/08/21 1645    Delton Prairie, MD 07/08/21 1650

## 2021-07-08 NOTE — Discharge Instructions (Signed)
Take tapered steroid as directed. You can take Norco for pain.

## 2021-07-18 DIAGNOSIS — Z720 Tobacco use: Secondary | ICD-10-CM | POA: Insufficient documentation

## 2021-08-10 ENCOUNTER — Emergency Department: Payer: Medicare Other

## 2021-08-10 ENCOUNTER — Other Ambulatory Visit: Payer: Self-pay

## 2021-08-10 ENCOUNTER — Emergency Department
Admission: EM | Admit: 2021-08-10 | Discharge: 2021-08-10 | Disposition: A | Discharge status: Home / Self Care | Payer: Medicare Other | Attending: Emergency Medicine | Admitting: Emergency Medicine

## 2021-08-10 ENCOUNTER — Encounter: Payer: Self-pay | Admitting: Emergency Medicine

## 2021-08-10 DIAGNOSIS — K5900 Constipation, unspecified: Secondary | ICD-10-CM | POA: Insufficient documentation

## 2021-08-10 DIAGNOSIS — R32 Unspecified urinary incontinence: Secondary | ICD-10-CM | POA: Insufficient documentation

## 2021-08-10 DIAGNOSIS — M5116 Intervertebral disc disorders with radiculopathy, lumbar region: Secondary | ICD-10-CM | POA: Insufficient documentation

## 2021-08-10 DIAGNOSIS — M549 Dorsalgia, unspecified: Secondary | ICD-10-CM | POA: Diagnosis present

## 2021-08-10 DIAGNOSIS — M5136 Other intervertebral disc degeneration, lumbar region: Secondary | ICD-10-CM

## 2021-08-10 DIAGNOSIS — M5416 Radiculopathy, lumbar region: Secondary | ICD-10-CM

## 2021-08-10 LAB — URINALYSIS, ROUTINE W REFLEX MICROSCOPIC
Bilirubin Urine: NEGATIVE
Glucose, UA: NEGATIVE mg/dL
Hgb urine dipstick: NEGATIVE
Ketones, ur: 5 mg/dL — AB
Leukocytes,Ua: NEGATIVE
Nitrite: NEGATIVE
Protein, ur: NEGATIVE mg/dL
Specific Gravity, Urine: 1.023 (ref 1.005–1.030)
pH: 5 (ref 5.0–8.0)

## 2021-08-10 MED ORDER — HYDROCODONE-ACETAMINOPHEN 5-325 MG PO TABS: 1.0000 | ORAL_TABLET | Freq: Four times a day (QID) | ORAL | 0 refills | Status: AC | PRN

## 2021-08-10 NOTE — Discharge Instructions (Signed)
Do not drive or work with machinery if you take a Norco.  Continue the etodolac.  Return to the ER for symptoms that change or worsen if unable to schedule an appointment.

## 2021-08-10 NOTE — ED Provider Notes (Signed)
[facility] Provider Note    Event Date/Time   First MD Initiated Contact with Patient 08/10/21 [id number]     (approximate)   History   Hip Pain   HPI  Justin Nixon is a 46 y.o. male  with history of arthritis, GERD, hyperlipidemia,  and schizophrenia presents to the emergency department for evaluation of back pain that radiates into the right leg. Symptoms have been ongoing x 13 weeks. No known trigger or injury. No relief with prednisone. Over the past 2 weeks, he has been constipated and has had incidents of urinary incontinence. These are new symptoms.      Physical Exam   Triage Vital Signs: ED Triage Vitals  Enc Vitals Group     BP 08/10/21 0821 (!) 139/100     Pulse Rate 08/10/21 0821 (!) 106     Resp 08/10/21 0821 16     Temp 08/10/21 0821 98.4 F (36.9 C)     Temp Source 08/10/21 0821 Oral     SpO2 08/10/21 0821 97 %     Weight 08/10/21 0807 149 lb (67.6 kg)     Height 08/10/21 0807 6' (1.829 m)     Head Circumference --      Peak Flow --      Pain Score 08/10/21 0807 10     Pain Loc --      Pain Edu? --      Excl. in [place]? --     Most recent vital signs: Vitals:   08/10/21 0821 08/10/21 1300  BP: (!) 139/100 130/80  Pulse: (!) 106 90  Resp: 16 16  Temp: 98.4 F (36.9 C)   SpO2: 97% 98%   General: Awake, no distress.  CV:  Good peripheral perfusion.  Resp:  Normal effort.  Abd:  No distention.  Other:  Motor and sensory function intact. Ambulates with steady gait using cane.    ED Results / Procedures / Treatments   Labs (all labs ordered are listed, but only abnormal results are displayed) Labs Reviewed  URINALYSIS, ROUTINE W REFLEX MICROSCOPIC - Abnormal; Notable for the following components:      Result Value   Color, Urine YELLOW (*)    APPearance CLEAR (*)    Ketones, ur 5 (*)    All other components within normal limits     EKG  Not indicated.   RADIOLOGY  Disc herniation at L4-L5 and widespread  degenerative disc disease per radiology.   PROCEDURES:  Critical Care performed: No  Procedures   MEDICATIONS ORDERED IN ED: Medications - No data to display   IMPRESSION / MDM / Donna / ED COURSE  I reviewed the triage vital signs and the nursing notes.   Differential diagnosis includes, but is not limited to, sciatic nerve compression, degenerative disc disease, cauda equina syndrome.  Urinalysis is normal. Results discussed with the patient. Because symptoms now include bowel and bladder changes,  MR Lumbar ordered. Patient aware and agreeable to the plan.   MRI lumbar spine shows disc herniation and widespread degenerative disc disease.  Results discussed with patient.  He has had 3 rounds of prednisone since the onset of symptoms and states that the prednisone does not help.  He just finished his last doses and does not want to take anymore steroids.  He is taking etodolac and was encouraged to continue.  He will be given a few tablets of Norco.  He does have an appointment with neurosurgery  on the 10th and was strongly advised to keep that appointment as scheduled.      FINAL CLINICAL IMPRESSION(S) / ED DIAGNOSES   Final diagnoses:  Disc degeneration, lumbar  Lumbar radiculopathy, acute     Rx / DC Orders   ED Discharge Orders          Ordered    HYDROcodone-acetaminophen (NORCO/VICODIN) 5-325 MG tablet  Every 6 hours PRN        08/10/21 1316             Note:  This document was prepared using Dragon voice recognition software and may include unintentional dictation errors.   Victorino Dike, FNP 08/10/21 1329    Duffy Bruce, MD 08/11/21 [phone]

## 2021-08-10 NOTE — ED Notes (Signed)
RN to bedside. Pt CAOx4 and in no acute distres. Hip pain x 13 weeks per pt.

## 2021-08-10 NOTE — ED Triage Notes (Signed)
Pt reports pain to right hip for about 13 weeks. Pt states has been diagnosed with a pinch nerve in his right hip and the pain radiates around to his buttocks and down his leg.

## 2021-08-15 ENCOUNTER — Emergency Department
Admission: EM | Admit: 2021-08-15 | Discharge: 2021-08-15 | Disposition: A | Discharge status: Home / Self Care | Payer: Medicare Other | Attending: Emergency Medicine | Admitting: Emergency Medicine

## 2021-08-15 ENCOUNTER — Other Ambulatory Visit: Payer: Self-pay

## 2021-08-15 ENCOUNTER — Encounter: Payer: Self-pay | Admitting: Emergency Medicine

## 2021-08-15 DIAGNOSIS — M48061 Spinal stenosis, lumbar region without neurogenic claudication: Secondary | ICD-10-CM

## 2021-08-15 DIAGNOSIS — R102 Pelvic and perineal pain: Secondary | ICD-10-CM | POA: Diagnosis present

## 2021-08-15 DIAGNOSIS — M5417 Radiculopathy, lumbosacral region: Secondary | ICD-10-CM

## 2021-08-15 DIAGNOSIS — M9963 Osseous and subluxation stenosis of intervertebral foramina of lumbar region: Secondary | ICD-10-CM | POA: Diagnosis not present

## 2021-08-15 DIAGNOSIS — M5416 Radiculopathy, lumbar region: Secondary | ICD-10-CM | POA: Diagnosis not present

## 2021-08-15 MED ORDER — CYCLOBENZAPRINE HCL 5 MG PO TABS: 5.0000 mg | ORAL_TABLET | Freq: Three times a day (TID) | ORAL | 1 refills | Status: DC | PRN

## 2021-08-15 MED ORDER — GABAPENTIN 600 MG PO TABS
300.0000 mg | ORAL_TABLET | Freq: Once | ORAL | Status: AC
  Administered 2021-08-15: 300 mg via ORAL
  Filled 2021-08-15: qty 1

## 2021-08-15 MED ORDER — DICLOFENAC SODIUM 75 MG PO TBEC: 75.0000 mg | DELAYED_RELEASE_TABLET | Freq: Two times a day (BID) | ORAL | 0 refills | Status: AC

## 2021-08-15 MED ORDER — ORPHENADRINE CITRATE 30 MG/ML IJ SOLN
60.0000 mg | INTRAMUSCULAR | Status: AC
  Administered 2021-08-15: 60 mg via INTRAMUSCULAR
  Filled 2021-08-15: qty 2

## 2021-08-15 MED ORDER — OXYCODONE-ACETAMINOPHEN 5-325 MG PO TABS: 1.0000 | ORAL_TABLET | Freq: Four times a day (QID) | ORAL | 0 refills | Status: AC | PRN

## 2021-08-15 MED ORDER — GABAPENTIN 300 MG PO CAPS: 300.0000 mg | ORAL_CAPSULE | Freq: Three times a day (TID) | ORAL | 2 refills | Status: DC

## 2021-08-15 MED ORDER — LIDOCAINE 5 % EX PTCH: 1.0000 | MEDICATED_PATCH | Freq: Two times a day (BID) | CUTANEOUS | 0 refills | Status: AC | PRN

## 2021-08-15 MED ORDER — OXYCODONE-ACETAMINOPHEN 5-325 MG PO TABS
1.0000 | ORAL_TABLET | Freq: Once | ORAL | Status: AC
  Administered 2021-08-15: 1 via ORAL
  Filled 2021-08-15: qty 1

## 2021-08-15 MED ORDER — KETOROLAC TROMETHAMINE 30 MG/ML IJ SOLN
30.0000 mg | Freq: Once | INTRAMUSCULAR | Status: AC
  Administered 2021-08-15: 30 mg via INTRAMUSCULAR
  Filled 2021-08-15: qty 1

## 2021-08-15 MED ORDER — PREDNISONE 20 MG PO TABS: 40.0000 mg | ORAL_TABLET | Freq: Every day | ORAL | 0 refills | Status: AC

## 2021-08-15 NOTE — ED Triage Notes (Signed)
Pt comes into the ED via POV c/o fall this morning where his leg went out on him.  He started feeling better and then his leg started going numb again.  Pt has known herniated disc which is what causes this problem for him.  Pt does have some groin pain as well.  Pt in NAD at this time with even and unlabored respirations.

## 2021-08-15 NOTE — Discharge Instructions (Addendum)
The prescription meds as directed.  Consider moist heat or ice compresses to help reduce symptoms.  Use your cane and walker at all times to help avoid any falls.  Rest with legs elevated as needed.  Avoid prolonged sitting or car riding.  Follow-up with neurosurgery as planned.  Return to the ED for any worsening or concerning symptoms.

## 2021-08-15 NOTE — ED Provider Notes (Signed)
[facility] Provider Note  Patient Contact: 7:05 PM (approximate)   History   Numbness, Groin Pain, and Fall  HPI  Justin Nixon is a 46 y.o. male presents to the ED with now resolved symptoms of right lower extremity paresthesias.  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  He denies any bladder or bowel incontinence, foot drop, or saddle anesthesia.  He does report a mechanical fall today, after his leg gave out.  He describes sharp burst of pain, causing weakness in his leg.  He denies any head injury or LOC.  He presents to the ED now with symptoms resolved at time of this evaluation.  He is presumed to require surgery for this arthropathy and lumbar radiculopathy.  He has been evaluated by Dr. Cari Caraway is currently being followed.  He denies any current medications at this time.  He reports previously prescribed pain medicines just cause drowsiness but no pain relief.  He also reports that he is required to complete physical therapy before insurance will approve his impending surgery.  Physical Exam   Triage Vital Signs: ED Triage Vitals  Enc Vitals Group     BP 08/15/21 1634 (!) 153/93     Pulse Rate 08/15/21 1634 (!) 116     Resp 08/15/21 1634 18     Temp 08/15/21 1634 98.8 F (37.1 C)     Temp Source 08/15/21 1634 Oral     SpO2 08/15/21 1634 96 %     Weight 08/15/21 1623 149 lb (67.6 kg)     Height 08/15/21 1623 6' (1.829 m)     Head Circumference --      Peak Flow --      Pain Score 08/15/21 1623 8     Pain Loc --      Pain Edu? --      Excl. in [place]? --     Most recent vital signs: Vitals:   08/15/21 1634 08/15/21 2042  BP: (!) 153/93 (!) 125/91  Pulse: (!) 116 89  Resp: 18 17  Temp: 98.8 F (37.1 C) 97.9 F (36.6 C)  SpO2: 96% 98%    General: Alert and in no acute distress. Cardiovascular:  Good peripheral perfusion Respiratory:  Normal respiratory effort without tachypnea or retractions. Lungs CTAB.  Musculoskeletal: Full range of motion to all extremities.  Neurologic:  No gross focal neurologic deficits are appreciated.  Cranial nerves II to XII grossly intact.  Normal LE DTRs bilaterally.  Positive seated straight leg raise on the right.  Normal toe dorsiflexion on exam.  Normal LE DTRs bilaterally. Skin:   No rash noted   ED Results / Procedures / Treatments   Labs (all labs ordered are listed, but only abnormal results are displayed) Labs Reviewed - No data to display   EKG   RADIOLOGY  {**I personally viewed and evaluated these images as part of my medical decision making, as well as reviewing the written report by the radiologist.  ED Provider Interpretation:   MRI Lumbar Spine (08/10/21)  IMPRESSION: 1. Symptomatic level appears to be L4-L5 where there is moderate to severe bilateral neural foraminal stenosis related to disc herniation +/- endplate spurring. Query right L4 radiculitis. A left foraminal disc extrusion there might be more acute, uncertain. 2. Disc bulging and endplate spurring elsewhere with up to moderate left L3 neural foraminal stenosis. 3. No lumbar spinal or  lateral recess stenosis.    PROCEDURES:  Critical Care performed: No  Procedures   MEDICATIONS ORDERED IN ED: Medications  ketorolac (TORADOL) 30 MG/ML injection 30 mg (30 mg Intramuscular Given 08/15/21 1859)  orphenadrine (NORFLEX) injection 60 mg (60 mg Intramuscular Given 08/15/21 1900)  oxyCODONE-acetaminophen (PERCOCET/ROXICET) 5-325 MG per tablet 1 tablet (1 tablet Oral Given 08/15/21 1900)  gabapentin (NEURONTIN) tablet 300 mg (300 mg Oral Given 08/15/21 2035)     IMPRESSION / MDM / ASSESSMENT AND PLAN / ED COURSE  I reviewed the triage vital signs and the nursing notes.                              Differential diagnosis includes, but is not limited to,   Patient's diagnosis is consistent with lumbar  radiculopathy. Patient will be discharged home with prescriptions for flexeril, Percocet, gabapentin. Patient is to follow up with Neurosurgery as needed or otherwise directed. Patient is given ED precautions to return to the ED for any worsening or new symptoms.  Patient with ED evaluation of right lower extremity radicular symptoms in the face of a confirmed bilateral foraminal stenosis.  Patient initially presented due to some radicular symptoms causing leg weakness and numbness.  This resulted in a mechanical fall without other injury.  Patient reports symptoms have resolved since reporting to the ED.  He currently is only taking narcotic pain medicines with limited benefit.  He denies any recent or current use of muscle relaxants, anti-inflammatories, or neurogenic pain medicines.  Patient is reporting a decrease in anxiety noting that his sensation in the right lower extremity is restored.  No red flags on exam.  Patient is able to demonstrate normal dorsiflexion and leg strength bilaterally.  Patient is treated in the ED with anti-inflammatories muscle axis and pain medicine.  He is reporting a 15-minute episode of right lower extremity paralysis and numbness at this time.  Symptoms began just before the patient received his IM injections, and resolved about 15 minutes.  Patient was able to ambulate with minimal difficulty and show full active range of motion to the RLE.  Patient is stable for discharge and outpatient management with neurosurgery.  He is given strict return precautions should his symptoms worsen or persist.  Discussed the case briefly with Dr. Volanda Napoleon, who agreed with the plan of a course of steroids, gabapentin, muscle relaxants, and pain medicine.  He will see the patient in 2 months as scheduled.  Patient is given strict return precautions and will follow with the ED if symptoms of RLE paralysis or weakness persist, recur, or worsen.   FINAL CLINICAL IMPRESSION(S) / ED  DIAGNOSES   Final diagnoses:  Lumbosacral radiculopathy  Foraminal stenosis of lumbar region     Rx / DC Orders   ED Discharge Orders          Ordered    cyclobenzaprine (FLEXERIL) 5 MG tablet  3 times daily PRN        08/15/21 2022    predniSONE (DELTASONE) 20 MG tablet  Daily with breakfast        08/15/21 2022    lidocaine (LIDODERM) 5 %  Every 12 hours PRN        08/15/21 2022    oxyCODONE-acetaminophen (PERCOCET) 5-325 MG tablet  Every 6 hours PRN        08/15/21 2022    gabapentin (NEURONTIN) 300 MG capsule  3 times daily  08/15/21 2022    diclofenac (VOLTAREN) 75 MG EC tablet  2 times daily        08/15/21 2022             Note:  This document was prepared using Dragon voice recognition software and may include unintentional dictation errors.    Melvenia Needles, PA-C 08/15/21 2312    Blake Divine, MD 08/15/21 [phone]

## 2021-08-27 DIAGNOSIS — Z20822 Contact with and (suspected) exposure to covid-19: Secondary | ICD-10-CM | POA: Diagnosis not present

## 2021-08-27 DIAGNOSIS — Y929 Unspecified place or not applicable: Secondary | ICD-10-CM | POA: Diagnosis not present

## 2021-08-27 DIAGNOSIS — Y999 Unspecified external cause status: Secondary | ICD-10-CM | POA: Diagnosis not present

## 2021-08-27 DIAGNOSIS — W1830XA Fall on same level, unspecified, initial encounter: Secondary | ICD-10-CM | POA: Diagnosis not present

## 2021-08-27 DIAGNOSIS — Z23 Encounter for immunization: Secondary | ICD-10-CM | POA: Diagnosis not present

## 2021-08-27 DIAGNOSIS — S52022B Displaced fracture of olecranon process without intraarticular extension of left ulna, initial encounter for open fracture type I or II: Secondary | ICD-10-CM | POA: Diagnosis not present

## 2021-08-27 DIAGNOSIS — S59902A Unspecified injury of left elbow, initial encounter: Secondary | ICD-10-CM | POA: Diagnosis present

## 2021-08-28 ENCOUNTER — Inpatient Hospital Stay: Payer: Medicare Other | Admitting: Certified Registered Nurse Anesthetist

## 2021-08-28 ENCOUNTER — Observation Stay
Admission: EM | Admit: 2021-08-28 | Discharge: 2021-08-29 | Disposition: A | Discharge status: Home / Self Care | Payer: Medicare Other | Attending: Surgery | Admitting: Surgery

## 2021-08-28 ENCOUNTER — Emergency Department: Payer: Medicare Other

## 2021-08-28 ENCOUNTER — Inpatient Hospital Stay: Payer: Medicare Other

## 2021-08-28 ENCOUNTER — Other Ambulatory Visit: Payer: Self-pay

## 2021-08-28 ENCOUNTER — Encounter: Payer: Self-pay | Admitting: Emergency Medicine

## 2021-08-28 ENCOUNTER — Encounter
Admission: EM | Disposition: A | Discharge status: Home / Self Care | Payer: Self-pay | Source: Home / Self Care | Attending: Emergency Medicine

## 2021-08-28 DIAGNOSIS — S52032B Displaced fracture of olecranon process with intraarticular extension of left ulna, initial encounter for open fracture type I or II: Secondary | ICD-10-CM | POA: Insufficient documentation

## 2021-08-28 DIAGNOSIS — Z419 Encounter for procedure for purposes other than remedying health state, unspecified: Secondary | ICD-10-CM

## 2021-08-28 DIAGNOSIS — S52022B Displaced fracture of olecranon process without intraarticular extension of left ulna, initial encounter for open fracture type I or II: Secondary | ICD-10-CM | POA: Diagnosis present

## 2021-08-28 DIAGNOSIS — S52023A Displaced fracture of olecranon process without intraarticular extension of unspecified ulna, initial encounter for closed fracture: Secondary | ICD-10-CM

## 2021-08-28 HISTORY — PX: ORIF ELBOW FRACTURE: SHX5031

## 2021-08-28 LAB — PROTIME-INR
INR: 0.9 (ref 0.8–1.2)
Prothrombin Time: 12.3 seconds (ref 11.4–15.2)

## 2021-08-28 LAB — CBC
HCT: 46.2 % (ref 39.0–52.0)
Hemoglobin: 16.1 g/dL (ref 13.0–17.0)
MCH: 33.1 pg (ref 26.0–34.0)
MCHC: 34.8 g/dL (ref 30.0–36.0)
MCV: 95.1 fL (ref 80.0–100.0)
Platelets: 300 10*3/uL (ref 150–400)
RBC: 4.86 MIL/uL (ref 4.22–5.81)
RDW: 13.4 % (ref 11.5–15.5)
WBC: 8.6 10*3/uL (ref 4.0–10.5)
nRBC: 0 % (ref 0.0–0.2)

## 2021-08-28 LAB — BASIC METABOLIC PANEL
Anion gap: 10 (ref 5–15)
BUN: 11 mg/dL (ref 6–20)
CO2: 24 mmol/L (ref 22–32)
Calcium: 9.1 mg/dL (ref 8.9–10.3)
Chloride: 102 mmol/L (ref 98–111)
Creatinine, Ser: 1 mg/dL (ref 0.61–1.24)
GFR, Estimated: >60 mL/min (ref >60)
Glucose, Bld: 88 mg/dL (ref 70–99)
Potassium: 3.9 mmol/L (ref 3.5–5.1)
Sodium: 136 mmol/L (ref 135–145)

## 2021-08-28 LAB — HIV ANTIBODY (ROUTINE TESTING W REFLEX): HIV Screen 4th Generation wRfx: NONREACTIVE

## 2021-08-28 LAB — RESP PANEL BY RT-PCR (FLU A&B, COVID) ARPGX2
Influenza A by PCR: NEGATIVE
Influenza B by PCR: NEGATIVE
SARS Coronavirus 2 by RT PCR: NEGATIVE

## 2021-08-28 SURGERY — OPEN REDUCTION INTERNAL FIXATION (ORIF) ELBOW/OLECRANON FRACTURE
Anesthesia: Regional | Site: Elbow | Laterality: Left

## 2021-08-28 MED ORDER — CEFAZOLIN SODIUM-DEXTROSE 2-4 GM/100ML-% IV SOLN
2.0000 g | Freq: Once | INTRAVENOUS | Status: DC
  Filled 2021-08-28: qty 100

## 2021-08-28 MED ORDER — ACETAMINOPHEN 10 MG/ML IV SOLN
INTRAVENOUS | Status: DC | PRN
  Administered 2021-08-28: 1000 mg via INTRAVENOUS

## 2021-08-28 MED ORDER — FENTANYL CITRATE PF 50 MCG/ML IJ SOSY: 50.0000 ug | PREFILLED_SYRINGE | Freq: Once | INTRAMUSCULAR | Status: AC

## 2021-08-28 MED ORDER — HYDROMORPHONE HCL 1 MG/ML IJ SOLN
0.5000 mg | INTRAMUSCULAR | Status: DC | PRN
  Administered 2021-08-28 (×2): 1 mg via INTRAVENOUS
  Filled 2021-08-28 (×2): qty 1

## 2021-08-28 MED ORDER — LIDOCAINE HCL (PF) 2 % IJ SOLN
INTRAMUSCULAR | Status: AC
  Filled 2021-08-28: qty 5

## 2021-08-28 MED ORDER — VITAMIN B-12 1000 MCG PO TABS
500.0000 ug | ORAL_TABLET | Freq: Every day | ORAL | Status: DC
  Administered 2021-08-28 – 2021-08-29 (×2): 500 ug via ORAL
  Filled 2021-08-28: qty 0.5
  Filled 2021-08-28 (×2): qty 1

## 2021-08-28 MED ORDER — DEXAMETHASONE SODIUM PHOSPHATE 10 MG/ML IJ SOLN
INTRAMUSCULAR | Status: AC
  Filled 2021-08-28: qty 1

## 2021-08-28 MED ORDER — TETANUS-DIPHTH-ACELL PERTUSSIS 5-2.5-18.5 LF-MCG/0.5 IM SUSY
0.5000 mL | PREFILLED_SYRINGE | Freq: Once | INTRAMUSCULAR | Status: AC
  Administered 2021-08-28: 04:00:00 0.5 mL via INTRAMUSCULAR
  Filled 2021-08-28: qty 0.5

## 2021-08-28 MED ORDER — ONDANSETRON HCL 4 MG/2ML IJ SOLN
INTRAMUSCULAR | Status: AC
  Filled 2021-08-28: qty 2

## 2021-08-28 MED ORDER — ACETAMINOPHEN 650 MG RE SUPP: 650.0000 mg | Freq: Four times a day (QID) | RECTAL | Status: DC | PRN

## 2021-08-28 MED ORDER — FLEET ENEMA 7-19 GM/118ML RE ENEM: 1.0000 | ENEMA | Freq: Once | RECTAL | Status: DC | PRN

## 2021-08-28 MED ORDER — CYCLOBENZAPRINE HCL 10 MG PO TABS
5.0000 mg | ORAL_TABLET | Freq: Three times a day (TID) | ORAL | Status: DC | PRN
  Administered 2021-08-28: 23:00:00 5 mg via ORAL
  Filled 2021-08-28: qty 1

## 2021-08-28 MED ORDER — ONDANSETRON HCL 4 MG/2ML IJ SOLN
4.0000 mg | INTRAMUSCULAR | Status: AC
  Administered 2021-08-28: 03:00:00 4 mg via INTRAVENOUS
  Filled 2021-08-28: qty 2

## 2021-08-28 MED ORDER — MORPHINE SULFATE (PF) 4 MG/ML IV SOLN
4.0000 mg | Freq: Once | INTRAVENOUS | Status: AC
  Administered 2021-08-28: 04:00:00 4 mg via INTRAVENOUS
  Filled 2021-08-28: qty 1

## 2021-08-28 MED ORDER — MIDAZOLAM HCL 2 MG/2ML IJ SOLN
INTRAMUSCULAR | Status: AC
  Filled 2021-08-28: qty 2

## 2021-08-28 MED ORDER — OXYCODONE HCL 5 MG PO TABS: 5.0000 mg | ORAL_TABLET | Freq: Once | ORAL | Status: DC | PRN

## 2021-08-28 MED ORDER — HYDROCODONE-ACETAMINOPHEN 5-325 MG PO TABS: 1.0000 | ORAL_TABLET | ORAL | Status: DC | PRN

## 2021-08-28 MED ORDER — SODIUM CHLORIDE 0.9 % IV SOLN: INTRAVENOUS | Status: DC

## 2021-08-28 MED ORDER — SODIUM CHLORIDE 0.9 % IV BOLUS
1000.0000 mL | Freq: Once | INTRAVENOUS | Status: AC
  Administered 2021-08-28: 04:00:00 1000 mL via INTRAVENOUS

## 2021-08-28 MED ORDER — DOCUSATE SODIUM 100 MG PO CAPS
100.0000 mg | ORAL_CAPSULE | Freq: Two times a day (BID) | ORAL | Status: DC
  Administered 2021-08-28 – 2021-08-29 (×2): 100 mg via ORAL
  Filled 2021-08-28 (×2): qty 1

## 2021-08-28 MED ORDER — BUPIVACAINE HCL (PF) 0.5 % IJ SOLN
INTRAMUSCULAR | Status: AC
  Filled 2021-08-28: qty 30

## 2021-08-28 MED ORDER — LACTATED RINGERS IV SOLN: INTRAVENOUS | Status: DC

## 2021-08-28 MED ORDER — METOCLOPRAMIDE HCL 10 MG PO TABS: 5.0000 mg | ORAL_TABLET | Freq: Three times a day (TID) | ORAL | Status: DC | PRN

## 2021-08-28 MED ORDER — DIPHENHYDRAMINE HCL 12.5 MG/5ML PO ELIX: 12.5000 mg | ORAL_SOLUTION | ORAL | Status: DC | PRN

## 2021-08-28 MED ORDER — ENOXAPARIN SODIUM 40 MG/0.4ML IJ SOSY
40.0000 mg | PREFILLED_SYRINGE | INTRAMUSCULAR | Status: DC
  Administered 2021-08-29: 40 mg via SUBCUTANEOUS
  Filled 2021-08-28: qty 0.4

## 2021-08-28 MED ORDER — MIDAZOLAM HCL 2 MG/2ML IJ SOLN
INTRAMUSCULAR | Status: DC | PRN
  Administered 2021-08-28: 2 mg via INTRAVENOUS

## 2021-08-28 MED ORDER — CEFAZOLIN SODIUM-DEXTROSE 2-4 GM/100ML-% IV SOLN
2.0000 g | Freq: Three times a day (TID) | INTRAVENOUS | Status: DC
  Administered 2021-08-28 – 2021-08-29 (×3): 2 g via INTRAVENOUS
  Filled 2021-08-28 (×3): qty 100

## 2021-08-28 MED ORDER — PROPOFOL 10 MG/ML IV BOLUS
INTRAVENOUS | Status: AC
  Filled 2021-08-28: qty 20

## 2021-08-28 MED ORDER — QUETIAPINE FUMARATE 25 MG PO TABS
50.0000 mg | ORAL_TABLET | Freq: Every day | ORAL | Status: DC
  Administered 2021-08-28: 23:00:00 100 mg via ORAL
  Filled 2021-08-28: qty 4

## 2021-08-28 MED ORDER — BISACODYL 10 MG RE SUPP: 10.0000 mg | Freq: Every day | RECTAL | Status: DC | PRN

## 2021-08-28 MED ORDER — ONDANSETRON HCL 4 MG/2ML IJ SOLN
INTRAMUSCULAR | Status: DC | PRN
Start: 2021-08-28 — End: 2021-08-28
  Administered 2021-08-28: 4 mg via INTRAVENOUS

## 2021-08-28 MED ORDER — CEFAZOLIN SODIUM 1 G IJ SOLR
INTRAMUSCULAR | Status: AC
  Filled 2021-08-28: qty 20

## 2021-08-28 MED ORDER — ONDANSETRON HCL 4 MG/2ML IJ SOLN: 4.0000 mg | Freq: Once | INTRAMUSCULAR | Status: DC | PRN

## 2021-08-28 MED ORDER — FENTANYL CITRATE PF 50 MCG/ML IJ SOSY
PREFILLED_SYRINGE | INTRAMUSCULAR | Status: AC
  Administered 2021-08-28: 14:00:00 50 ug via INTRAVENOUS
  Filled 2021-08-28: qty 1

## 2021-08-28 MED ORDER — DEXAMETHASONE SODIUM PHOSPHATE 10 MG/ML IJ SOLN
INTRAMUSCULAR | Status: DC | PRN
  Administered 2021-08-28: 10 mg via INTRAVENOUS

## 2021-08-28 MED ORDER — FENTANYL CITRATE (PF) 100 MCG/2ML IJ SOLN
INTRAMUSCULAR | Status: DC | PRN
  Administered 2021-08-28 (×2): 25 ug via INTRAVENOUS

## 2021-08-28 MED ORDER — SODIUM CHLORIDE (PF) 0.9 % IJ SOLN
INTRAMUSCULAR | Status: AC
  Filled 2021-08-28: qty 20

## 2021-08-28 MED ORDER — BISACODYL 10 MG RE SUPP
10.0000 mg | Freq: Every day | RECTAL | Status: DC | PRN
  Filled 2021-08-28: qty 1

## 2021-08-28 MED ORDER — MAGNESIUM HYDROXIDE 400 MG/5ML PO SUSP: 30.0000 mL | Freq: Every day | ORAL | Status: DC | PRN

## 2021-08-28 MED ORDER — GLYCOPYRROLATE 0.2 MG/ML IJ SOLN
INTRAMUSCULAR | Status: AC
  Filled 2021-08-28: qty 1

## 2021-08-28 MED ORDER — CEFAZOLIN SODIUM-DEXTROSE 2-4 GM/100ML-% IV SOLN
2.0000 g | Freq: Three times a day (TID) | INTRAVENOUS | Status: DC
  Administered 2021-08-28: 05:00:00 2 g via INTRAVENOUS
  Filled 2021-08-28 (×4): qty 100

## 2021-08-28 MED ORDER — ACETAMINOPHEN 10 MG/ML IV SOLN
INTRAVENOUS | Status: AC
  Filled 2021-08-28: qty 100

## 2021-08-28 MED ORDER — MORPHINE SULFATE (PF) 4 MG/ML IV SOLN
4.0000 mg | Freq: Once | INTRAVENOUS | Status: AC
  Administered 2021-08-28: 03:00:00 4 mg via INTRAVENOUS
  Filled 2021-08-28: qty 1

## 2021-08-28 MED ORDER — MIDAZOLAM HCL 2 MG/2ML IJ SOLN: 1.0000 mg | Freq: Once | INTRAMUSCULAR | Status: DC

## 2021-08-28 MED ORDER — ACETAMINOPHEN 10 MG/ML IV SOLN: 1000.0000 mg | Freq: Once | INTRAVENOUS | Status: DC | PRN

## 2021-08-28 MED ORDER — 0.9 % SODIUM CHLORIDE (POUR BTL) OPTIME
TOPICAL | Status: DC | PRN
  Administered 2021-08-28: 16:00:00 1000 mL

## 2021-08-28 MED ORDER — CEFAZOLIN SODIUM-DEXTROSE 2-3 GM-%(50ML) IV SOLR
INTRAVENOUS | Status: DC | PRN
  Administered 2021-08-28: 2 g via INTRAVENOUS

## 2021-08-28 MED ORDER — FENTANYL CITRATE (PF) 100 MCG/2ML IJ SOLN
INTRAMUSCULAR | Status: AC
  Filled 2021-08-28: qty 2

## 2021-08-28 MED ORDER — SUGAMMADEX SODIUM 200 MG/2ML IV SOLN
INTRAVENOUS | Status: DC | PRN
  Administered 2021-08-28: 400 mg via INTRAVENOUS

## 2021-08-28 MED ORDER — ONDANSETRON HCL 4 MG/2ML IJ SOLN: 4.0000 mg | Freq: Four times a day (QID) | INTRAMUSCULAR | Status: DC | PRN

## 2021-08-28 MED ORDER — GABAPENTIN 300 MG PO CAPS
300.0000 mg | ORAL_CAPSULE | Freq: Three times a day (TID) | ORAL | Status: DC
  Administered 2021-08-28 – 2021-08-29 (×2): 300 mg via ORAL
  Filled 2021-08-28 (×2): qty 1

## 2021-08-28 MED ORDER — MAGNESIUM HYDROXIDE 400 MG/5ML PO SUSP
30.0000 mL | Freq: Every day | ORAL | Status: DC | PRN
  Administered 2021-08-28: 23:00:00 30 mL via ORAL
  Filled 2021-08-28: qty 30

## 2021-08-28 MED ORDER — ONDANSETRON HCL 4 MG PO TABS
4.0000 mg | ORAL_TABLET | Freq: Four times a day (QID) | ORAL | Status: DC | PRN
  Administered 2021-08-28: 23:00:00 4 mg via ORAL
  Filled 2021-08-28: qty 1

## 2021-08-28 MED ORDER — BUPIVACAINE HCL (PF) 0.5 % IJ SOLN
INTRAMUSCULAR | Status: DC | PRN
  Administered 2021-08-28: 30 mL

## 2021-08-28 MED ORDER — PANTOPRAZOLE SODIUM 40 MG IV SOLR: 40.0000 mg | Freq: Every day | INTRAVENOUS | Status: DC

## 2021-08-28 MED ORDER — ACETAMINOPHEN 325 MG PO TABS: 650.0000 mg | ORAL_TABLET | Freq: Four times a day (QID) | ORAL | Status: DC | PRN

## 2021-08-28 MED ORDER — FENTANYL CITRATE (PF) 100 MCG/2ML IJ SOLN: 25.0000 ug | INTRAMUSCULAR | Status: DC | PRN

## 2021-08-28 MED ORDER — LACTATED RINGERS IV SOLN: INTRAVENOUS | Status: DC | PRN

## 2021-08-28 MED ORDER — PROPOFOL 10 MG/ML IV BOLUS
INTRAVENOUS | Status: DC | PRN
  Administered 2021-08-28: 150 mg via INTRAVENOUS

## 2021-08-28 MED ORDER — ADULT MULTIVITAMIN W/MINERALS CH
1.0000 | ORAL_TABLET | Freq: Every day | ORAL | Status: DC
  Administered 2021-08-28 – 2021-08-29 (×2): 1 via ORAL
  Filled 2021-08-28 (×2): qty 1

## 2021-08-28 MED ORDER — ROCURONIUM BROMIDE 100 MG/10ML IV SOLN
INTRAVENOUS | Status: DC | PRN
  Administered 2021-08-28: 50 mg via INTRAVENOUS
  Administered 2021-08-28: 20 mg via INTRAVENOUS

## 2021-08-28 MED ORDER — LIDOCAINE HCL (CARDIAC) PF 100 MG/5ML IV SOSY
PREFILLED_SYRINGE | INTRAVENOUS | Status: DC | PRN
  Administered 2021-08-28: 100 mg via INTRAVENOUS

## 2021-08-28 MED ORDER — LURASIDONE HCL 40 MG PO TABS
40.0000 mg | ORAL_TABLET | Freq: Every day | ORAL | Status: DC
  Administered 2021-08-29: 40 mg via ORAL
  Filled 2021-08-28 (×2): qty 1

## 2021-08-28 MED ORDER — METOCLOPRAMIDE HCL 5 MG/ML IJ SOLN: 5.0000 mg | Freq: Three times a day (TID) | INTRAMUSCULAR | Status: DC | PRN

## 2021-08-28 MED ORDER — DOCUSATE SODIUM 100 MG PO CAPS: 100.0000 mg | ORAL_CAPSULE | Freq: Two times a day (BID) | ORAL | Status: DC

## 2021-08-28 MED ORDER — PHENYLEPHRINE HCL (PRESSORS) 10 MG/ML IV SOLN
INTRAVENOUS | Status: DC | PRN
Start: 2021-08-28 — End: 2021-08-28
  Administered 2021-08-28 (×3): 160 ug via INTRAVENOUS

## 2021-08-28 MED ORDER — OXYCODONE HCL 5 MG/5ML PO SOLN: 5.0000 mg | Freq: Once | ORAL | Status: DC | PRN

## 2021-08-28 SURGICAL SUPPLY — 56 items
BIT DRILL CALIBRATED 2.7 (BIT) ×1 IMPLANT
BLADE SURG 15 STRL LF DISP TIS (BLADE) ×1 IMPLANT
BLADE SURG 15 STRL SS (BLADE) ×1
BNDG COHESIVE 4X5 TAN ST LF (GAUZE/BANDAGES/DRESSINGS) ×2 IMPLANT
BNDG ELASTIC 4X5.8 VLCR NS LF (GAUZE/BANDAGES/DRESSINGS) ×4 IMPLANT
BNDG ELASTIC 4X5.8 VLCR STR LF (GAUZE/BANDAGES/DRESSINGS) ×1 IMPLANT
BNDG ESMARK 4X12 TAN STRL LF (GAUZE/BANDAGES/DRESSINGS) ×2 IMPLANT
CHLORAPREP W/TINT 26 (MISCELLANEOUS) ×2 IMPLANT
COVER MAYO STAND REUSABLE (DRAPES) ×2 IMPLANT
CUFF TOURN SGL QUICK 18X4 (TOURNIQUET CUFF) ×2 IMPLANT
CUFF TOURN SGL QUICK 24 (TOURNIQUET CUFF) ×1
CUFF TRNQT CYL 24X4X16.5-23 (TOURNIQUET CUFF) ×1 IMPLANT
DRAPE 3/4 80X56 (DRAPES) ×2 IMPLANT
DRAPE C-ARM XRAY 36X54 (DRAPES) ×3 IMPLANT
DRAPE U-SHAPE 47X51 STRL (DRAPES) ×2 IMPLANT
ELECT CAUTERY BLADE 6.4 (BLADE) ×2 IMPLANT
ELECT REM PT RETURN 9FT ADLT (ELECTROSURGICAL) ×2
ELECTRODE REM PT RTRN 9FT ADLT (ELECTROSURGICAL) ×1 IMPLANT
GAUZE SPONGE 4X4 12PLY STRL (GAUZE/BANDAGES/DRESSINGS) ×2 IMPLANT
GAUZE XEROFORM 1X8 LF (GAUZE/BANDAGES/DRESSINGS) ×2 IMPLANT
GLOVE SRG 8 PF TXTR STRL LF DI (GLOVE) ×1 IMPLANT
GLOVE SURG ENC MOIS LTX SZ8 (GLOVE) ×6 IMPLANT
GLOVE SURG UNDER LTX SZ8 (GLOVE) ×2 IMPLANT
GLOVE SURG UNDER POLY LF SZ8 (GLOVE) ×1
GOWN SRG 2XL LVL 4 RGLN SLV (GOWNS) ×1 IMPLANT
GOWN STRL NON-REIN 2XL LVL4 (GOWNS) ×1
GOWN STRL REUS W/ TWL LRG LVL3 (GOWN DISPOSABLE) ×1 IMPLANT
GOWN STRL REUS W/ TWL XL LVL3 (GOWN DISPOSABLE) ×1 IMPLANT
GOWN STRL REUS W/TWL LRG LVL3 (GOWN DISPOSABLE) ×1
GOWN STRL REUS W/TWL XL LVL3 (GOWN DISPOSABLE) ×1
K-WIRE FIXATION 2.0X6 (WIRE) ×4
KIT TURNOVER KIT A (KITS) ×2 IMPLANT
KWIRE FIXATION 2.0X6 (WIRE) IMPLANT
MANIFOLD NEPTUNE II (INSTRUMENTS) ×2 IMPLANT
NDL FILTER BLUNT 18X1 1/2 (NEEDLE) ×1 IMPLANT
NEEDLE FILTER BLUNT 18X 1/2SAF (NEEDLE) ×1
NEEDLE FILTER BLUNT 18X1 1/2 (NEEDLE) ×1 IMPLANT
NS IRRIG 500ML POUR BTL (IV SOLUTION) ×3 IMPLANT
PACK EXTREMITY ARMC (MISCELLANEOUS) ×2 IMPLANT
PAD ABD DERMACEA PRESS 5X9 (GAUZE/BANDAGES/DRESSINGS) ×4 IMPLANT
PAD PREP 24X41 OB/GYN DISP (PERSONAL CARE ITEMS) ×2 IMPLANT
PADDING CAST 4IN STRL (MISCELLANEOUS) ×1
PADDING CAST BLEND 4X4 STRL (MISCELLANEOUS) ×2 IMPLANT
PLATE OLECRANON SM (Plate) ×1 IMPLANT
SCREW CORT T15 28X3.5XST LCK (Screw) IMPLANT
SCREW CORTICAL 3.5X28MM (Screw) ×1 IMPLANT
SCREW LOCK 3.5X20 DIST TIB (Screw) ×1 IMPLANT
SCREW LOCK CORT STAR 3.5X18 (Screw) ×1 IMPLANT
SCREW LOCK CORT STAR 3.5X20 (Screw) ×2 IMPLANT
SCREW LP 3.5 (Screw) ×1 IMPLANT
SPONGE T-LAP 18X18 ~~LOC~~+RFID (SPONGE) ×2 IMPLANT
STAPLER SKIN PROX 35W (STAPLE) ×2 IMPLANT
STOCKINETTE IMPERVIOUS 9X36 MD (GAUZE/BANDAGES/DRESSINGS) ×2 IMPLANT
SUT VIC AB 2-0 CT1 (SUTURE) ×2 IMPLANT
WASHER 3.5MM (Orthopedic Implant) ×1 IMPLANT
WATER STERILE IRR 500ML POUR (IV SOLUTION) ×2 IMPLANT

## 2021-08-28 NOTE — Transfer of Care (Signed)
Immediate Anesthesia Transfer of Care Note  Patient: Justin Nixon  Procedure(s) Performed: Procedure(s): OPEN REDUCTION INTERNAL FIXATION (ORIF) ELBOW/OLECRANON FRACTURE (Left)  Patient Location: PACU  Anesthesia Type:General  Level of Consciousness: sedated  Airway & Oxygen Therapy: Patient Spontanous Breathing and Patient connected to face mask oxygen  Post-op Assessment: Report given to RN and Post -op Vital signs reviewed and stable  Post vital signs: Reviewed and stable  Last Vitals:  Vitals:   08/28/21 1400 08/28/21 1615  BP: 128/87 103/78  Pulse: 87 82  Resp:  15  Temp:  (!) 36.4 C  SpO2: 123XX123 [id number]    Complications: No apparent anesthesia complications

## 2021-08-28 NOTE — Plan of Care (Signed)
  Problem: Education: Goal: Knowledge of General Education information will improve Description: Including pain rating scale, medication(s)/side effects and non-pharmacologic comfort measures Outcome: Progressing   Problem: Clinical Measurements: Goal: Ability to maintain clinical measurements within normal limits will improve Outcome: Progressing Goal: Will remain free from infection Outcome: Progressing Goal: Diagnostic test results will improve Outcome: Progressing Goal: Respiratory complications will improve Outcome: Progressing Goal: Cardiovascular complication will be avoided Outcome: Progressing   Problem: Activity: Goal: Risk for activity intolerance will decrease Outcome: Progressing   Problem: Nutrition: Goal: Adequate nutrition will be maintained Outcome: Progressing   Problem: Coping: Goal: Level of anxiety will decrease Outcome: Progressing   Problem: Pain Managment: Goal: General experience of comfort will improve Outcome: Progressing   Problem: Safety: Goal: Ability to remain free from injury will improve Outcome: Progressing   Problem: Skin Integrity: Goal: Risk for impaired skin integrity will decrease Outcome: Progressing   

## 2021-08-28 NOTE — Anesthesia Procedure Notes (Signed)
Anesthesia Regional Block: Supraclavicular block   Pre-Anesthetic Checklist: , timeout performed,  Correct Patient, Correct Site, Correct Laterality,  Correct Procedure,, site marked,  Risks and benefits discussed,  Surgical consent,  Pre-op evaluation,  At surgeon's request and post-op pain management  Laterality: Left  Prep: chloraprep       Needles:  Injection technique: Single-shot  Needle Type: Echogenic Needle          Additional Needles:   Procedures:,,,, ultrasound used (permanent image in chart),,   Motor weakness within 20 minutes.  Narrative:  Start time: 08/28/2021 2:09 PM End time: 08/28/2021 2:10 PM Injection made incrementally with aspirations every 5 mL.  Performed by: Personally  Anesthesiologist: Reed Breech, MD  Additional Notes: Functioning IV was confirmed and monitors applied.  Sterile prep and drape, hand hygiene and sterile gloves were used. Ultrasound guidance: relevant anatomy identified, needle position confirmed, local anesthetic spread visualized around nerve(s), vascular puncture avoided.  Image saved to electronic medical record.  Negative aspiration prior to incremental administration of local anesthetic for total 30 ml bupivacaine 0.5% given in supraclavicular distribution. The patient tolerated the procedure well. Vital signs and moderate sedation medications recorded in RN notes.

## 2021-08-28 NOTE — ED Notes (Signed)
EKG, no blood at this time per Dr. Sidney Ace.

## 2021-08-28 NOTE — Anesthesia Preprocedure Evaluation (Addendum)
Anesthesia Evaluation  Patient identified by MRN, date of birth, ID band Patient awake    Reviewed: Allergy & Precautions, NPO status , Patient's Chart, lab work & pertinent test results  History of Anesthesia Complications Negative for: history of anesthetic complications  Airway Mallampati: IV   Neck ROM: Full    Dental  (+) Poor Dentition Several missing and chipped teeth:   Pulmonary Current Smoker (1/2 ppd) and Patient abstained from smoking.,    Pulmonary exam normal breath sounds clear to auscultation       Cardiovascular hypertension, Normal cardiovascular exam Rhythm:Regular Rate:Normal  ECG 08/28/21:  Sinus tachycardia (HR 135) Right atrial enlargement Minimal voltage criteria for LVH, may be normal variant ( Sokolow-Lyon )   Neuro/Psych PSYCHIATRIC DISORDERS Anxiety Schizophrenia  Neuromuscular disease (lumbar radiculopathy)    GI/Hepatic GERD  ,  Endo/Other  negative endocrine ROS  Renal/GU negative Renal ROS     Musculoskeletal  (+) Arthritis ,   Abdominal   Peds  Hematology negative hematology ROS (+)   Anesthesia Other Findings   Reproductive/Obstetrics                            Anesthesia Physical Anesthesia Plan  ASA: 2  Anesthesia Plan: General and Regional   Post-op Pain Management: Regional block   Induction: Intravenous  PONV Risk Score and Plan: 1 and Ondansetron, Dexamethasone and Treatment may vary due to age or medical condition  Airway Management Planned: Oral ETT  Additional Equipment:   Intra-op Plan:   Post-operative Plan: Extubation in OR  Informed Consent: I have reviewed the patients History and Physical, chart, labs and discussed the procedure including the risks, benefits and alternatives for the proposed anesthesia with the patient or authorized representative who has indicated his/her understanding and acceptance.     Dental advisory  given  Plan Discussed with: CRNA  Anesthesia Plan Comments: (Plan for preoperative supraclavicular nerve block and GETA.  Patient consented for risks of anesthesia including but not limited to:  - adverse reactions to medications - damage to eyes, teeth, lips or other oral mucosa - nerve damage due to positioning  - sore throat or hoarseness - damage to heart, brain, nerves, lungs, other parts of body or loss of life  Informed patient about role of CRNA in peri- and intra-operative care.  Patient voiced understanding.)       Anesthesia Quick Evaluation

## 2021-08-28 NOTE — ED Provider Notes (Signed)
[facility] Provider Note    Event Date/Time   First MD Initiated Contact with Patient 08/28/21 [phone]     (approximate)   History   Laceration   HPI  Justin Nixon is a 46 y.o. male   he was walking, he has been having issues with low back pain and some weakness in his right lower leg for some time now actually scheduled to have surgery with Dr. Marcell Barlow in the future  As he was walking his right leg gave out which is not unusual, but it caused him to stumble and fall he landed directly on his left elbow.  He had immediate left elbow pain and swelling and some bleeding and a laceration over his left elbow  No numbness or weakness.  No neck pain did not strike his head or suffer any other injury.  Tetanus he is not last sure when he received  Reports an allergy to penicillin only because of an itching rash      Personally reviewed the patient's previous history and physical from October 10, 2020 by Dr. Joice Lofts.  Patient was taken to the OR for treatment of epicondylitis and chondromalacia of the left elbow  Physical Exam   Triage Vital Signs: ED Triage Vitals  Enc Vitals Group     BP 08/28/21 0013 (!) 126/99     Pulse --      Resp 08/28/21 0013 (!) 22     Temp 08/28/21 0013 98.5 F (36.9 C)     Temp Source 08/28/21 0013 Oral     SpO2 08/28/21 0013 96 %     Weight 08/28/21 0011 148 lb (67.1 kg)     Height 08/28/21 0011 6' (1.829 m)     Head Circumference --      Peak Flow --      Pain Score 08/28/21 0011 7     Pain Loc --      Pain Edu? --      Excl. in GC? --     Most recent vital signs: Vitals:   08/28/21 0013  BP: (!) 126/99  Resp: (!) 22  Temp: 98.5 F (36.9 C)  SpO2: 96%     General: Awake, no distress.  Very pleasant, his mother accompanies him CV:  Good peripheral perfusion.  Strong left radial pulse normal capillary refill all digits of the left hand. Resp:  Normal effort.  Clear lung sounds Abd:  No distention.   Other:    LEFT Left upper extremity demonstrates normal strength, good use of all muscles of significant limitation in range of motion of the left elbow. No edema bruising or contusions of the left shoulder/upper arm  / hand. Strong radial pulse. Intact median/ulnar/radial neuro-muscular exam.  Patient has a large laceration involving cutaneous and subcutaneous tissues overlying the left lateral elbow/olecranon.  There is a small area of laceration that appears to be quite deep, did not probe it but it does exhibit some small arteriolar bleeding.  This area was treated with Surgicel which was placed over the area of bleeding, compression over that and bleeding stopped.  Area was irrigated with normal saline 500 mL.  Bandaged with gauze dressing, Vaseline, and underneath that Surgicel.  After wrap applied, confirmed strong radial pulse normal capillary refill fingers of the left hand.  Patient placed the patient physician a comfort with padding and pillows.   Remaining extremities grossly normal without evidence of injury.   ED Results / Procedures / Treatments  Labs (all labs ordered are listed, but only abnormal results are displayed) Labs Reviewed  RESP PANEL BY RT-PCR (FLU A&B, COVID) ARPGX2  CBC  BASIC METABOLIC PANEL  PROTIME-INR     EKG  Reviewed inter by me at 0020 Heart rate 135 QRS 89 QTc 440 Sinus tachycardia   RADIOLOGY DG Elbow Complete Left  Result Date: 08/28/2021 CLINICAL DATA:  Fall with elbow trauma. EXAM: LEFT ELBOW - COMPLETE 3+ VIEW COMPARISON:  None. FINDINGS: There is dorsal skin laceration and swelling at the level of the olecranon, positive anterior and posterior supracondylar fat pad signs, and an acute transverse oblique fracture of the dorsal base of the olecranon process of the ulna. There is no further evidence of fractures. Arthritic changes are not seen. There is normal bone mineralization. No radiopaque foreign body is evident. IMPRESSION:  Transverse oblique intra-articular nondisplaced fracture of the dorsal base of the proximal ulnar olecranon process, with joint effusion and dorsal overlying laceration. Electronically Signed   By: Almira BarKeith  Chesser M.D.   On: 08/28/2021 02:02     Images personally viewed and interpreted by me, concerning for fracture of the olecranon process of the left elbow.  I discussed imaging with Dr. Joice LoftsPoggi of orthopedics.    PROCEDURES:  Critical Care performed: Yes, see critical care procedure note(s)  CRITICAL CARE Performed by: Sharyn CreamerMark Zayd Bonet   Total critical care time: 25 minutes  Critical care time was exclusive of separately billable procedures and treating other patients.  Critical care was necessary to treat or prevent imminent or life-threatening deterioration.  Critical care was time spent personally by me on the following activities: development of treatment plan with patient and/or surrogate as well as nursing, discussions with consultants, evaluation of patient's response to treatment, examination of patient, obtaining history from patient or surrogate, ordering and performing treatments and interventions, ordering and review of laboratory studies, ordering and review of radiographic studies, pulse oximetry and re-evaluation of patient's condition.   Procedures   MEDICATIONS ORDERED IN ED: Medications  Tdap (BOOSTRIX) injection 0.5 mL (has no administration in time range)  ceFAZolin (ANCEF) IVPB 2g/100 mL premix (has no administration in time range)  morphine 4 MG/ML injection 4 mg (4 mg Intravenous Given 08/28/21 0254)  ondansetron (ZOFRAN) injection 4 mg (4 mg Intravenous Given 08/28/21 0254)     IMPRESSION / MDM / ASSESSMENT AND PLAN / ED COURSE  I reviewed the triage vital signs and the nursing notes.                              I personally viewed the patient's laboratory test, and interpreted them.  Normal CBC Personally reviewed interpret the patient's left elbow  imaging. Obtain consultation from orthopedics on a stat basis for concerns of a potential left open fracture of the elbow  Differential diagnosis includes, but is not limited to, open fracture of the left elbow.  Patient has evidence of overlying laceration with small amount of bleeding with an underlying fracture on imaging.  Clinical exam is concerning for a puncture of the skin by the bone, and given the location of the laceration high concern for open fracture.  Start patient on Ancef, tetanus, I have discussed and obtain consult from orthopedics Dr. Joice LoftsPoggi who is agreeable to admitting patient.  We will maintain the patient n.p.o.  Pain control.  Patient denies any other injury there is no head trauma no cervical injury.  Clinical exam shows no  evidence of other injury other than isolated injury to the left elbow.  Patient reports an isolated fall on the left elbow         FINAL CLINICAL IMPRESSION(S) / ED DIAGNOSES   Final diagnoses:  Type I or II open fracture of olecranon process of left ulna, initial encounter     Rx / DC Orders   ED Discharge Orders     None        Note:  This document was prepared using Dragon voice recognition software and may include unintentional dictation errors.   Sharyn Creamer, MD 08/28/21 [phone]

## 2021-08-28 NOTE — Op Note (Signed)
08/28/2021  4:10 PM  Patient:   Justin Nixon  Pre-Op Diagnosis:   Grade 1 open displaced olecranon fracture, left elbow.  Post-Op Diagnosis:   Grade 2 open displaced olecranon fracture, left elbow.  Procedure:   Irrigation and debridement of left elbow wound, open reduction and internal fixation of displaced left olecranon fracture.  Surgeon:   Pascal Lux, MD  Assistant:   None  Anesthesia:   GET with supraclavicular block placed preoperative by the anesthesiologist  Findings:   As above.  Complications:   None  Fluids:   700 cc crystalloid  EBL:   10 cc  UOP:   None  TT:   64 minutes at 250 mmHg  Drains:   None  Closure:   Staples  Implants:   Short [employer] precontoured olecranon plate  Brief Clinical Note:   The patient is a 46 year old male who sustained above-noted injury late last night when his right leg gave out on him and he fell onto his flexed elbow.  He presented to emergency room where physical examination and x-rays demonstrated what was thought to be a grade 1 open left olecranon fracture.  The patient's wound was irrigated in the emergency room and dressed sterilely.  The patient was started on prophylactic IV antibiotics and presents  at this time for definitive management of this injury.  Procedure:   The patient underwent the placement of a supraclavicular block in the preoperative holding area by the anesthesiologist before being brought into the operating room and laid in the supine position.  After adequate general endotracheal intubation and anesthesia were obtained, the patient was rolled into the right lateral decubitus position and secured using a short beanbag.  Care was taken to be sure that all bony prominences were padded and that an axillary roll was utilized.  The left upper extremity was prepped with ChloraPrep solution before being draped sterilely.  Preoperative antibiotics were administered.  A timeout was performed to verify the  appropriate surgical site before the limb was exsanguinated with an Esmarch and the tourniquet inflated to 250 mmHg.    An approximately 10 to 12 cm somewhat zigzag'd incision was made over the posterior aspect of the elbow in order to incorporate the obliquely oriented 3 cm laceration.  The skin margins of this laceration were debrided sharply with a #15 blade and the digit deeper tissue was also debrided sharply.  The incision was carried down through the subcutaneous tissues to expose the fracture site.  The abundant fracture hematoma was debrided using pick-ups, curettes, rongeurs, and irrigation.  Once the fracture was debrided thoroughly, the fracture was reduced and temporarily secured using a bone tenaculum.    After verifying the adequacy of reduction fluoroscopically in AP and lateral projections, the short [employer] precontoured olecranon plate was selected.  Minor additional contouring was performed before the plate was applied to the posterior aspect of the elbow and secured using a nonlocking bicortical screw distally and a locking "homerun screw" proximally.  The adequacy of hardware position was verified and found to be excellent.  Proximally, one locking screw and one variable angle locking screw were placed through the bone fragment proximally.  Two additional locking screws placed distally to stabilize the fracture.  Again the adequacy of fracture reduction and hardware position was verified fluoroscopically in AP and lateral projections and found to be excellent.    The wound was copiously irrigated with bacitracin saline solution with bulb irrigation before the deeper fascial  layers were reapproximated using 2-0 Vicryl interrupted sutures.  The subcutaneous tissues were closed using 2-0 Vicryl interrupted sutures before the skin was closed with skin staples.  A sterile bulky dressing was applied to the wound.  The patient was then placed into a posterior splint maintaining the elbow at  approximately 80 degrees of flexion.  The patient was rolled back into the supine position on his/her hospital bed before being awakened, extubated, and returned to the recovery room in satisfactory condition after tolerating the procedure well.

## 2021-08-28 NOTE — ED Triage Notes (Signed)
Pt to ED from home c/o fall and laceration tonight.  Fell onto left elbow with approx 3in laceration, bleeding, deep lac.  Hx of herniated discs and legs giving out on him intermittently d/t going numb.  Denies hitting head or LOC, denies blood thinners.  States has had previous surgery on left elbow by Dr. Joice Lofts.  A&Ox4, chest rise even and unlabored.

## 2021-08-28 NOTE — Plan of Care (Signed)
°  Problem: Education: Goal: Knowledge of General Education information will improve Description: Including pain rating scale, medication(s)/side effects and non-pharmacologic comfort measures Outcome: Progressing   Problem: Clinical Measurements: Goal: Ability to maintain clinical measurements within normal limits will improve Outcome: Progressing Goal: Will remain free from infection Outcome: Progressing Goal: Diagnostic test results will improve Outcome: Progressing Goal: Respiratory complications will improve Outcome: Progressing Goal: Cardiovascular complication will be avoided Outcome: Progressing   Problem: Activity: Goal: Risk for activity intolerance will decrease Outcome: Progressing   Problem: Nutrition: Goal: Adequate nutrition will be maintained Outcome: Progressing   Problem: Coping: Goal: Level of anxiety will decrease Outcome: Progressing   Problem: Pain Managment: Goal: General experience of comfort will improve Outcome: Progressing   Problem: Skin Integrity: Goal: Risk for impaired skin integrity will decrease Outcome: Progressing

## 2021-08-28 NOTE — ED Notes (Addendum)
New bulky compression dressing placed per Dr. Joice Lofts and posterior splint placed per order.

## 2021-08-28 NOTE — Anesthesia Procedure Notes (Addendum)
Procedure Name: Intubation Date/Time: 08/28/2021 2:22 PM Performed by: Doreen Salvage, CRNA Pre-anesthesia Checklist: Patient identified, Patient being monitored, Timeout performed, Emergency Drugs available and Suction available Patient Re-evaluated:Patient Re-evaluated prior to induction Oxygen Delivery Method: Circle system utilized Preoxygenation: Pre-oxygenation with 100% oxygen Induction Type: IV induction Ventilation: Mask ventilation without difficulty Laryngoscope Size: Mac, McGraph and 4 Grade View: Grade I Tube type: Oral Tube size: 7.5 mm Number of attempts: 1 Airway Equipment and Method: Stylet Placement Confirmation: ETT inserted through vocal cords under direct vision, positive ETCO2 and breath sounds checked- equal and bilateral Secured at: 23 cm Tube secured with: Tape Dental Injury: Teeth and Oropharynx as per pre-operative assessment

## 2021-08-28 NOTE — H&P (Signed)
Subjective:  Chief complaint: Posterior left elbow pain.  The patient is a 46 y.o. male who sustained an injury to the left elbow last evening.  Apparently, he has been dealing with lower back pain radiating to his right lower extremity for 4 months.  He had seen Dr. Cari Caraway for the symptoms several weeks ago and had been started in physical therapy.  Apparently, his right leg gave way last night, causing him to fall onto his flexed left elbow.  The patient presented to emergency room where x-rays demonstrated a mildly displaced intra-articular olecranon fracture.  His examination demonstrated a laceration over the posterior aspect of the elbow which was concerning for an open fracture.  The patient's wound was dressed by the ER provider and he was placed into a posterior splint.  In addition, antibiotics were started.  The patient denies any associated injury.  The patient did not strike his head or lose consciousness.  The patient also denies any light-headedness, dizziness, chest pain, or shortness of breath which might have contributed to the injury.  Patient Active Problem List   Diagnosis Date Noted   Olecranon fracture, left, open type I or II, initial encounter 08/28/2021   Past Medical History:  Diagnosis Date   Anxiety    Arthritis    GERD (gastroesophageal reflux disease)    H/O   Hyperlipidemia    Schizophrenia ([place])     Past Surgical History:  Procedure Laterality Date   COLONOSCOPY     CYST EXCISION     ELBOW ARTHROSCOPY Left 10/10/2020   Procedure: LEFT ELBOW ARTHROSCOPY WITH DEBRIDEMENT AND EXCISION OF LOOSE BODIES;  Surgeon: Corky Mull, MD;  Location: [facility];  Service: Orthopedics;  Laterality: Left;   MYRINGOTOMY WITH TUBE PLACEMENT     SHOULDER ARTHROSCOPY Left 03/18/2017   Procedure: ARTHROSCOPY SHOULDER with debridement decompression and distal clavicle excision;  Surgeon: Corky Mull, MD;  Location: [place];  Service: Orthopedics;  Laterality: Left;    SHOULDER ARTHROSCOPY WITH OPEN ROTATOR CUFF REPAIR Right 09/23/2016   Procedure: SHOULDER ARTHROSCOPY WITH OPEN ROTATOR CUFF REPAIR;  Surgeon: Corky Mull, MD;  Location: [facility];  Service: Orthopedics;  Laterality: Right;   SHOULDER ARTHROSCOPY WITH SUBACROMIAL DECOMPRESSION Right 09/23/2016   Procedure: SHOULDER ARTHROSCOPY WITH SUBACROMIAL DECOMPRESSION AND DEBRIDEMENT;  Surgeon: Corky Mull, MD;  Location: [facility];  Service: Orthopedics;  Laterality: Right;    (Not in a hospital admission)  Allergies  Allergen Reactions   Penicillins Itching    TOLERATED CEFAZOLIN 10/10/2020 Has patient had a PCN reaction causing immediate rash, facial/tongue/throat swelling, SOB or lightheadedness with hypotension: No Has patient had a PCN reaction causing severe rash involving mucus membranes or skin necrosis: No Has patient had a PCN reaction that required hospitalization No Has patient had a PCN reaction occurring within the last 10 years: Yes If all of the above answers are "NO", then may proceed with Cephalosporin use.     Social History   Tobacco Use   Smoking status: Every Day    Packs/day: 0.50    Years: 22.00    Pack years: 11.00    Types: Cigarettes   Smokeless tobacco: Never  Substance Use Topics   Alcohol use: Yes    Comment: OCC    History reviewed. No pertinent family history.   Review of Systems: As noted above. The patient denies any chest pain, shortness of breath, nausea, vomiting, diarrhea, constipation, belly pain, blood in his/her stool, or burning with urination.  Objective: Temp:  [98.5 F (36.9 C)] 98.5 F (36.9 C) (01/25 0013) Pulse Rate:  [75-89] 89 (01/25 0630) Resp:  [16-22] 16 (01/25 0630) BP: (98-126)/(72-99) 100/72 (01/25 0630) SpO2:  [96 %-99 %] 99 % (01/25 0630) Weight:  [67.1 kg] 67.1 kg (01/25 0011)  Physical Exam: General:  Alert, no acute distress Psychiatric:  Patient is competent for consent with normal mood and affect Cardiovascular:  RRR   Respiratory:  Clear to auscultation. No wheezing. Non-labored breathing GI:  Abdomen is soft and non-tender Skin:  No lesions in the area of chief complaint Neurologic:  Sensation intact distally Lymphatic:  No axillary or cervical lymphadenopathy  Orthopedic Exam:  Orthopedic examination is limited to the left elbow and upper extremity.  The patient is in a posterior splint maintaining well but approximate 75 degrees of flexion.  The splint appears to be in good shape.  The skin is intact at the proximal distal ends of the splint.  He is able to flex and extend all digits.  Sensation is intact to light touch in all digits.  He has good capillary refill to all digits.  Imaging Review: Recent x-rays of the left elbow are available for review.  These films demonstrate a mildly displaced 2-part intra-articular olecranon fracture with a large hemarthrosis.  No significant degenerative changes are noted.  Assessment: Grade 1 open left olecranon fracture.  Plan: The treatment options, including both surgical and nonsurgical choices, have been discussed in detail with the patient.  The patient would like to proceed with surgical intervention to include a formal irrigation and debridement of his wound with open reduction and internal fixation of the left olecranon fracture.  The risks (including bleeding, infection, nerve and/or blood vessel injury, persistent or recurrent pain, loosening or failure of the components, leg length inequality, dislocation, need for further surgery, blood clots, strokes, heart attacks or arrhythmias, pneumonia, etc.) and benefits of the surgical procedure were discussed.  The patient states his/her understanding and agrees to proceed.  A formal written consent will be obtained by the nursing staff.

## 2021-08-29 ENCOUNTER — Encounter: Payer: Self-pay | Admitting: Surgery

## 2021-08-29 MED ORDER — OXYCODONE HCL 5 MG PO TABS
5.0000 mg | ORAL_TABLET | Freq: Four times a day (QID) | ORAL | Status: DC | PRN
  Administered 2021-08-29: 5 mg via ORAL
  Filled 2021-08-29: qty 1

## 2021-08-29 MED ORDER — ASPIRIN EC 325 MG PO TBEC: 325.0000 mg | DELAYED_RELEASE_TABLET | Freq: Every day | ORAL | 0 refills | Status: DC

## 2021-08-29 MED ORDER — OXYCODONE HCL 5 MG PO TABS
5.0000 mg | ORAL_TABLET | Freq: Four times a day (QID) | ORAL | 0 refills | Status: DC | PRN
Start: 2021-08-29 — End: 2021-11-20

## 2021-08-29 MED ORDER — SULFAMETHOXAZOLE-TRIMETHOPRIM 800-160 MG PO TABS: 1.0000 | ORAL_TABLET | Freq: Two times a day (BID) | ORAL | 0 refills | Status: DC

## 2021-08-29 NOTE — Discharge Instructions (Signed)
Diet: As you were doing prior to hospitalization   Shower:  May shower but keep the wounds dry, use an occlusive plastic wrap, NO SOAKING IN TUB.  If the bandage gets wet, change with a clean dry gauze.  Dressing:  Keep splint dry, intact.  Do not remove.  Activity:  Increase activity slowly as tolerated, but follow the weight bearing instructions below.  No lifting or driving for 6 weeks.  Weight Bearing:   Non-weightbearing to the left arm.  To prevent constipation: you may use a stool softener such as -  Colace (over the counter) 100 mg by mouth twice a day  Drink plenty of fluids (prune juice may be helpful) and high fiber foods Miralax (over the counter) for constipation as needed.    Itching:  If you experience itching with your medications, try taking only a single pain pill, or even half a pain pill at a time.  You may take up to 10 pain pills per day, and you can also use benadryl over the counter for itching or also to help with sleep.   Precautions:  If you experience chest pain or shortness of breath - call 911 immediately for transfer to the hospital emergency department!!  If you develop a fever greater that 101 F, purulent drainage from wound, increased redness or drainage from wound, or calf pain-Call [facility]                                              Follow- Up Appointment:  Please call for an appointment to be seen in 2 weeks at [facility]

## 2021-08-29 NOTE — Discharge Summary (Signed)
Physician Discharge Summary  Patient ID: Justin Nixon MRN: [id number] DOB/AGE: [birth year] 46 y.o.  Admit date: 08/28/2021 Discharge date: 08/29/2021  Admission Diagnoses:  Olecranon fracture, left, open type I or II, initial encounter [S52.022B] Type I or II open fracture of olecranon process of left ulna, initial encounter [S52.022B]  Discharge Diagnoses: Patient Active Problem List   Diagnosis Date Noted   Olecranon fracture, left, open type I or II, initial encounter 08/28/2021   Past Medical History:  Diagnosis Date   Anxiety    Arthritis    GERD (gastroesophageal reflux disease)    H/O   Hyperlipidemia    Schizophrenia (Justin Nixon)      Transfusion: None.   Consultants (if any):   Discharged Condition: Improved  Hospital Course: Justin Nixon is an 46 y.o. male who was admitted 08/28/2021 with a diagnosis of Olecranon fracture, left, open type I or II, initial encounter and went to the operating room on 08/28/2021 and underwent the above named procedures.    Surgeries: Procedure(s): OPEN REDUCTION INTERNAL FIXATION (ORIF) ELBOW/OLECRANON FRACTURE on 08/28/2021 Patient tolerated the surgery well. Taken to PACU where she was stabilized and then transferred to the orthopedic floor.  Started on Lovenox 40mg  q 24 hrs. Foot pumps applied bilaterally at 80 mm. Heels elevated on bed with rolled towels. No evidence of DVT. Negative Homan. Physical therapy started on day #1 for gait training and transfer. OT started day #1 for ADL and assisted devices.  Patient's IV was removed on POD1.  Implants: Short [employer] precontoured olecranon plate.  He was given perioperative antibiotics:  Anti-infectives (From admission, onward)    Start     Dose/Rate Route Frequency Ordered Stop   08/29/21 0000  sulfamethoxazole-trimethoprim (BACTRIM DS) 800-160 MG tablet        1 tablet Oral 2 times daily 08/29/21 0736     08/28/21 2200  ceFAZolin (ANCEF) IVPB 2g/100 mL premix        2 g 200  mL/hr over 30 Minutes Intravenous Every 8 hours 08/28/21 1824 08/30/21 0559   08/28/21 0600  ceFAZolin (ANCEF) IVPB 2g/100 mL premix  Status:  Discontinued        2 g 200 mL/hr over 30 Minutes Intravenous Every 8 hours 08/28/21 0437 08/28/21 1704   08/28/21 0245  ceFAZolin (ANCEF) IVPB 2g/100 mL premix  Status:  Discontinued        2 g 200 mL/hr over 30 Minutes Intravenous  Once 08/28/21 0238 08/28/21 0312     .  He was given sequential compression devices, early ambulation, and Lovenox for DVT prophylaxis.  He benefited maximally from the hospital stay and there were no complications.    Recent vital signs:  Vitals:   08/29/21 0237 08/29/21 0858  BP: 100/74 118/80  Pulse: 75 81  Resp: 14 15  Temp: 97.6 F (36.4 C) 98.2 F (36.8 C)  SpO2: 100% 99%    Recent laboratory studies:  Lab Results  Component Value Date   HGB 16.1 08/28/2021   Lab Results  Component Value Date   WBC 8.6 08/28/2021   PLT 300 08/28/2021   Lab Results  Component Value Date   INR 0.9 08/28/2021   Lab Results  Component Value Date   NA 136 08/28/2021   K 3.9 08/28/2021   CL 102 08/28/2021   CO2 24 08/28/2021   BUN 11 08/28/2021   CREATININE 1.00 08/28/2021   GLUCOSE 88 08/28/2021    Discharge Medications:   Allergies as of  08/29/2021       Reactions   Penicillins Itching   TOLERATED CEFAZOLIN 10/10/2020 Has patient had a PCN reaction causing immediate rash, facial/tongue/throat swelling, SOB or lightheadedness with hypotension: No Has patient had a PCN reaction causing severe rash involving mucus membranes or skin necrosis: No Has patient had a PCN reaction that required hospitalization No Has patient had a PCN reaction occurring within the last 10 years: Yes If all of the above answers are "NO", then may proceed with Cephalosporin use. Has patient had a PCN reaction causing immediate rash, facial/tongue/throat swelling, SOB or lightheadedness with hypotension: No Has patient had a PCN  reaction causing severe rash involving mucus membranes or skin necrosis: No Has patient had a PCN reaction that required hospitalization No Has patient had a PCN reaction occurring within the last 10 years: Yes If all of the above answers are "NO", then may proceed with Cephalosporin use. TOLERATED CEFAZOLIN 10/10/2020 Has patient had a PCN reaction causing immediate rash, facial/tongue/throat swelling, SOB or lightheadedness with hypotension: No Has patient had a PCN reaction causing severe rash involving mucus membranes or skin necrosis: No Has patient had a PCN reaction that required hospitalization No Has patient had a PCN reaction occurring within the last 10 years: Yes If all of the above answers are "NO", then may proceed with Cephalosporin use.        Medication List     TAKE these medications    aspirin EC 325 MG tablet Take 1 tablet (325 mg total) by mouth daily.   cyclobenzaprine 5 MG tablet Commonly known as: FLEXERIL Take 1 tablet (5 mg total) by mouth 3 (three) times daily as needed.   diclofenac 75 MG EC tablet Commonly known as: VOLTAREN Take 1 tablet (75 mg total) by mouth 2 (two) times daily. Start after steroid is complete   etodolac 500 MG tablet Commonly known as: LODINE Take 500 mg by mouth 2 (two) times daily.   gabapentin 300 MG capsule Commonly known as: Neurontin Take 1 capsule (300 mg total) by mouth 3 (three) times daily. Start with 1 PO QHS x 3 days, then 1 BID x 3 days, then 1 TID   lurasidone 40 MG Tabs tablet Commonly known as: LATUDA Take 40 mg by mouth daily. After supper   methocarbamol 500 MG tablet Commonly known as: ROBAXIN Take 500 mg by mouth 2 (two) times daily.   multivitamin with minerals Tabs tablet Take 1 tablet by mouth daily.   oxyCODONE 5 MG immediate release tablet Commonly known as: Roxicodone Take 1 tablet (5 mg total) by mouth every 6 (six) hours as needed for severe pain.   QUEtiapine 100 MG tablet Commonly known  as: SEROQUEL Take 50-100 mg by mouth at bedtime.   sulfamethoxazole-trimethoprim 800-160 MG tablet Commonly known as: BACTRIM DS Take 1 tablet by mouth 2 (two) times daily.   vitamin B-12 500 MCG tablet Commonly known as: CYANOCOBALAMIN Take 500 mcg by mouth daily.       Diagnostic Studies: DG Elbow 2 Views Left  Result Date: 08/28/2021 CLINICAL DATA:  Operative fixation of a left olecranon fracture. EXAM: LEFT ELBOW - 2 VIEW COMPARISON:  08/28/2021 FINDINGS: Two C-arm images demonstrate screw and plate fixation of the previously described olecranon fracture with anatomic position and alignment. No additional fractures are seen. IMPRESSION: Screw and plate fixation of the previously demonstrated olecranon fracture. Electronically Signed   By: Claudie Revering M.D.   On: 08/28/2021 16:09   DG Elbow Complete  Left  Result Date: 08/28/2021 CLINICAL DATA:  Fall with elbow trauma. EXAM: LEFT ELBOW - COMPLETE 3+ VIEW COMPARISON:  None. FINDINGS: There is dorsal skin laceration and swelling at the level of the olecranon, positive anterior and posterior supracondylar fat pad signs, and an acute transverse oblique fracture of the dorsal base of the olecranon process of the ulna. There is no further evidence of fractures. Arthritic changes are not seen. There is normal bone mineralization. No radiopaque foreign body is evident. IMPRESSION: Transverse oblique intra-articular nondisplaced fracture of the dorsal base of the proximal ulnar olecranon process, with joint effusion and dorsal overlying laceration. Electronically Signed   By: Telford Nab M.D.   On: 08/28/2021 02:02   MR LUMBAR SPINE WO CONTRAST  Result Date: 08/10/2021 CLINICAL DATA:  46 year old male with pain radiating to the right buttock and hip, leg for about 13 weeks. EXAM: MRI LUMBAR SPINE WITHOUT CONTRAST TECHNIQUE: Multiplanar, multisequence MR imaging of the lumbar spine was performed. No intravenous contrast was administered.  COMPARISON:  Report of lumbar radiographs 04/26/2021 (no images available). FINDINGS: Segmentation: Assuming the lowest visible ribs are T12 then lumbar segmentation is normal with a vestigial S1-S2 disc space. Correlation with radiographs is recommended prior to any operative intervention. Alignment: Relatively preserved lordosis. There is mild retrolisthesis of L2 on L3. Subtle levoconvex scoliotic curvature. Vertebrae: No marrow edema or evidence of acute osseous abnormality. Visualized bone marrow signal is within normal limits. Intact visible sacrum and SI joints. Conus medullaris and cauda equina: Conus extends to the L1-L2 level. No lower spinal cord or conus signal abnormality. Normal cauda equina nerve roots. Paraspinal and other soft tissues: Negative. Disc levels: T12-L1:  Negative. L1-L2: Disc desiccation. Circumferential although mostly anterior disc bulging. Posterior central annular fissure. No stenosis. L2-L3: Disc desiccation. Circumferential disc bulge. Subtle posterior annular fissure. No stenosis. L3-L4: Disc desiccation and mild disc space loss. Circumferential disc bulge. Left greater than right foraminal disc and endplate spurring. No spinal or lateral recess stenosis. Up to moderate left and mild right L3 foraminal stenosis. L4-L5: Disc desiccation and mild disc space loss. Circumferential disc bulge with superimposed left foraminal annular fissure and disc extrusion best seen on series 5, image 13 and series 8, image 26. No spinal or lateral recess stenosis. Moderate to severe left L4 foraminal stenosis. But there is also a moderate to severe contralateral right L4 foraminal stenosis which appears related to combined disc and endplate on series 5, image 5. L5-S1:  Negative disc. No stenosis. S1-S2: Vestigial disc. No stenosis. IMPRESSION: 1. Symptomatic level appears to be L4-L5 where there is moderate to severe bilateral neural foraminal stenosis related to disc herniation +/- endplate  spurring. Query right L4 radiculitis. A left foraminal disc extrusion there might be more acute, uncertain. 2. Disc bulging and endplate spurring elsewhere with up to moderate left L3 neural foraminal stenosis. 3. No lumbar spinal or lateral recess stenosis. Electronically Signed   By: Genevie Ann M.D.   On: 08/10/2021 12:31   DG C-Arm 1-60 Min-No Report  Result Date: 08/28/2021 Fluoroscopy was utilized by the requesting physician.  No radiographic interpretation.   DG C-Arm 1-60 Min-No Report  Result Date: 08/28/2021 Fluoroscopy was utilized by the requesting physician.  No radiographic interpretation.   [place] OR NERVE BLOCK-IMAGE ONLY [facility])  Result Date: 08/28/2021 There is no interpretation for this exam.  This order is for images obtained during a surgical procedure.  Please See "Surgeries" Tab for more information regarding the procedure.  Disposition: Plan for discharge home today after 1400 dose of Ancef.   Follow-up Information     Lattie Corns, PA-C Follow up.   Specialty: Physician Assistant Why: Follow-up next week for skin check. Contact information: [place] [address] [phone]                Signed: Judson Roch PA-C 08/29/2021, 10:48 AM

## 2021-08-29 NOTE — Progress Notes (Signed)
°  Subjective: 1 Day Post-Op Procedure(s) (LRB): OPEN REDUCTION INTERNAL FIXATION (ORIF) ELBOW/OLECRANON FRACTURE (Left) Patient reports pain as mild.   Patient is well, and has had no acute complaints or problems Plan is to go Home after hospital stay. Negative for chest pain and shortness of breath Fever: no Gastrointestinal:Negative for nausea and vomiting Patient states that he is passing gas today.  Objective: Vital signs in last 24 hours: Temp:  [97.5 F (36.4 C)-99.2 F (37.3 C)] 97.6 F (36.4 C) (01/26 0237) Pulse Rate:  [75-106] 75 (01/26 0237) Resp:  [14-22] 14 (01/26 0237) BP: (100-139)/(64-93) 100/74 (01/26 0237) SpO2:  [95 %-100 %] 100 % (01/26 0237)  Intake/Output from previous day:  Intake/Output Summary (Last 24 hours) at 08/29/2021 0729 Last data filed at 08/28/2021 1650 Gross per 24 hour  Intake 1040 ml  Output 10 ml  Net 1030 ml    Intake/Output this shift: No intake/output data recorded.  Labs: Recent Labs    08/28/21 0243  HGB 16.1   Recent Labs    08/28/21 0243  WBC 8.6  RBC 4.86  HCT 46.2  PLT 300   Recent Labs    08/28/21 0243  NA 136  K 3.9  CL 102  CO2 24  BUN 11  CREATININE 1.00  GLUCOSE 88  CALCIUM 9.1   Recent Labs    08/28/21 0243  INR 0.9     EXAM General - Patient is Alert, Appropriate, and Oriented Extremity - Splint intact to the left arm.  No drainage noted, no loosening of the splint. Able to flex and extend fingers without pain, cap refill intact to each digit. Intact to light touch to the dorsal and volar aspect of the hand. Abdomen soft with normal bowel sounds this AM. Negative Homans to bilateral lower extremities.  Past Medical History:  Diagnosis Date   Anxiety    Arthritis    GERD (gastroesophageal reflux disease)    H/O   Hyperlipidemia    Schizophrenia ([facility])     Assessment/Plan: 1 Day Post-Op Procedure(s) (LRB): OPEN REDUCTION INTERNAL FIXATION (ORIF) ELBOW/OLECRANON FRACTURE  (Left) Principal Problem:   Olecranon fracture, left, open type I or II, initial encounter  Estimated body mass index is 20.07 kg/m as calculated from the following:   Height as of this encounter: 6' (1.829 m).   Weight as of this encounter: 67.1 kg. Advance diet D/C IV fluids when tolerating po intake.  Patient is doing well this morning. Patient is passing gas currently. Currently on IV Ancef, will plan for discharge on po ABX. Possible d/c home today, will discuss transition to PO Abx. Follow-up next week for skin check.   DVT Prophylaxis - Lovenox and SCDs Non-weightbearing to the left arm.  Valeria Batman, PA-C [facility] Orthopaedic Surgery 08/29/2021, 7:29 AM

## 2021-08-29 NOTE — TOC Progression Note (Signed)
Transition of Care [facility]) - Progression Note    Patient Details  Name: Justin Nixon MRN: [id number] Date of Birth: [birth year]  [facility]) CM/SW [place], RN Phone Number: 08/29/2021, 1:07 PM  Clinical Narrative:   Met with the patient to discuss DC plan and needs, he lives with his ex wife, he uses a cane for mobility, explained the code 65, he stated understanding, his mother will pick him up0 and provide transportation to the doctor, no Needs         Expected Discharge Plan and Services           Expected Discharge Date: 08/29/21                                     Social Determinants of Health (SDOH) Interventions    Readmission Risk Interventions No flowsheet data found.

## 2021-08-29 NOTE — Anesthesia Postprocedure Evaluation (Signed)
Anesthesia Post Note  Patient: Justin Nixon  Procedure(s) Performed: OPEN REDUCTION INTERNAL FIXATION (ORIF) ELBOW/OLECRANON FRACTURE (Left: Elbow)  Patient location during evaluation: PACU Anesthesia Type: Regional and General Level of consciousness: awake and alert, oriented and patient cooperative Pain management: pain level controlled Vital Signs Assessment: post-procedure vital signs reviewed and stable Respiratory status: spontaneous breathing, nonlabored ventilation and respiratory function stable Cardiovascular status: blood pressure returned to baseline and stable Postop Assessment: adequate PO intake Anesthetic complications: no   No notable events documented.   Last Vitals:  Vitals:   08/29/21 0237 08/29/21 0858  BP: 100/74 118/80  Pulse: 75 81  Resp: 14 15  Temp: 36.4 C 36.8 C  SpO2: 100% 99%    Last Pain:  Vitals:   08/28/21 2059  TempSrc:   PainSc: 0-No pain                 Darrin Nipper

## 2021-10-27 DIAGNOSIS — S6440XA Injury of digital nerve of unspecified finger, initial encounter: Secondary | ICD-10-CM | POA: Insufficient documentation

## 2021-10-27 DIAGNOSIS — R200 Anesthesia of skin: Secondary | ICD-10-CM | POA: Insufficient documentation

## 2021-10-27 DIAGNOSIS — S61209A Unspecified open wound of unspecified finger without damage to nail, initial encounter: Secondary | ICD-10-CM | POA: Insufficient documentation

## 2021-10-28 ENCOUNTER — Other Ambulatory Visit: Payer: Self-pay | Admitting: Neurosurgery

## 2021-10-28 DIAGNOSIS — Z01818 Encounter for other preprocedural examination: Secondary | ICD-10-CM

## 2021-11-08 ENCOUNTER — Encounter
Admission: RE | Admit: 2021-11-08 | Discharge: 2021-11-08 | Disposition: A | Discharge status: Home / Self Care | Payer: Medicare Other | Source: Ambulatory Visit | Attending: Neurosurgery | Admitting: Neurosurgery

## 2021-11-08 ENCOUNTER — Other Ambulatory Visit: Payer: Self-pay

## 2021-11-08 VITALS — BP 145/95 | Temp 98.1°F | Resp 18 | Ht 72.0 in | Wt 154.0 lb

## 2021-11-08 DIAGNOSIS — Z01818 Encounter for other preprocedural examination: Secondary | ICD-10-CM

## 2021-11-08 DIAGNOSIS — Z01812 Encounter for preprocedural laboratory examination: Secondary | ICD-10-CM | POA: Insufficient documentation

## 2021-11-08 LAB — BASIC METABOLIC PANEL
Anion gap: 8 (ref 5–15)
BUN: 10 mg/dL (ref 6–20)
CO2: 28 mmol/L (ref 22–32)
Calcium: 9.1 mg/dL (ref 8.9–10.3)
Chloride: 103 mmol/L (ref 98–111)
Creatinine, Ser: 1.04 mg/dL (ref 0.61–1.24)
GFR, Estimated: >60 mL/min (ref >60)
Glucose, Bld: 110 mg/dL — ABNORMAL HIGH (ref 70–99)
Potassium: 4 mmol/L (ref 3.5–5.1)
Sodium: 139 mmol/L (ref 135–145)

## 2021-11-08 LAB — CBC
HCT: 41.4 % (ref 39.0–52.0)
Hemoglobin: 13.7 g/dL (ref 13.0–17.0)
MCH: 32.5 pg (ref 26.0–34.0)
MCHC: 33.1 g/dL (ref 30.0–36.0)
MCV: 98.1 fL (ref 80.0–100.0)
Platelets: 309 10*3/uL (ref 150–400)
RBC: 4.22 MIL/uL (ref 4.22–5.81)
RDW: 13.2 % (ref 11.5–15.5)
WBC: 5.8 10*3/uL (ref 4.0–10.5)
nRBC: 0 % (ref 0.0–0.2)

## 2021-11-08 LAB — URINALYSIS, COMPLETE (UACMP) WITH MICROSCOPIC
Bacteria, UA: NONE SEEN
Bilirubin Urine: NEGATIVE
Glucose, UA: NEGATIVE mg/dL
Hgb urine dipstick: NEGATIVE
Ketones, ur: NEGATIVE mg/dL
Leukocytes,Ua: NEGATIVE
Nitrite: NEGATIVE
Protein, ur: 30 mg/dL — AB
Specific Gravity, Urine: 1.021 (ref 1.005–1.030)
Squamous Epithelial / HPF: NONE SEEN (ref 0–5)
pH: 8 (ref 5.0–8.0)

## 2021-11-08 LAB — TYPE AND SCREEN
ABO/RH(D): O POS
Antibody Screen: NEGATIVE

## 2021-11-08 LAB — SURGICAL PCR SCREEN
MRSA, PCR: NEGATIVE
Staphylococcus aureus: NEGATIVE

## 2021-11-08 NOTE — Patient Instructions (Signed)
Your procedure is scheduled on: 11/18/2021 ?Report to the Registration Desk on the 1st floor of the [facility]. ?To find out your arrival time, please call [phone] between 1PM - 3PM on: 11/15/2021 ? ?REMEMBER: ?Instructions that are not followed completely may result in serious medical risk, up to and including death; or upon the discretion of your surgeon and anesthesiologist your surgery may need to be rescheduled. ? ?Do not eat food after midnight the night before surgery.  ?No gum chewing, lozengers or hard candies. ? ?You may however, drink CLEAR liquids up to 2 hours before you are scheduled to arrive for your surgery. Do not drink anything within 2 hours of your scheduled arrival time. ? ?Clear liquids include: ?- water  ?- apple juice without pulp ?- gatorade (not RED colors) ?- black coffee or tea (Do NOT add milk or creamers to the coffee or tea) ?Do NOT drink anything that is not on this list. ? ? ?Follow recommendations from Cardiologist, Pulmonologist or PCP regarding stopping Aspirin. ? ?One week prior to surgery: ?Stop Anti-inflammatories (NSAIDS) such as Advil, Aleve, Ibuprofen, Motrin, Naproxen, Naprosyn and Aspirin based products such as Excedrin, Goodys Powder, BC Powder. ? ?Stop ANY OVER THE COUNTER supplements until after surgery. ?You may however, continue to take Tylenol if needed for pain up until the day of surgery. ? ?No Alcohol for 24 hours before or after surgery. ? ?No Smoking including e-cigarettes for 24 hours prior to surgery.  ?No chewable tobacco products for at least 6 hours prior to surgery.  ?No nicotine patches on the day of surgery. ? ?Do not use any "recreational" drugs for at least a week prior to your surgery.  ?Please be advised that the combination of cocaine and anesthesia may have negative outcomes, up to and including death. ?If you test positive for cocaine, your surgery will be cancelled. ? ?On the morning of surgery brush your teeth with toothpaste and water,  you may rinse your mouth with mouthwash if you wish. ?Do not swallow any toothpaste or mouthwash. ? ?Use CHG Soap or wipes as directed on instruction sheet-provided for you  ? ?Do not wear jewelry, make-up, hairpins, clips or nail polish. ? ?Do not wear lotions, powders, or perfumes.  ? ?Do not shave body from the neck down 48 hours prior to surgery just in case you cut yourself which could leave a site for infection.  ?Also, freshly shaved skin may become irritated if using the CHG soap. ? ?Contact lenses, hearing aids and dentures may not be worn into surgery. ? ?Do not bring valuables to the hospital. [facility] is not responsible for any missing/lost belongings or valuables.  ?  ?  ? Notify your doctor if there is any change in your medical condition (cold, fever, infection). ? ?Wear comfortable clothing (specific to your surgery type) to the hospital. ? ?After surgery, you can help prevent lung complications by doing breathing exercises.  ?Take deep breaths and cough every 1-2 hours. Your doctor may order a device called an Incentive Spirometer to help you take deep breaths. ? ?If you are being admitted to the hospital overnight, leave your suitcase in the car. ?After surgery it may be brought to your room. ? ?If you are being discharged the day of surgery, you will not be allowed to drive home. ?You will need a responsible adult (18 years or older) to drive you home and stay with you that night.  ? ?If you are taking public transportation,  you will need to have a responsible adult (18 years or older) with you. ?Please confirm with your physician that it is acceptable to use public transportation.  ? ?Please call the [place] Dept. at [phone] if you have any questions about these instructions. ? ?Surgery Visitation Policy: ? ?Patients undergoing a surgery or procedure may have two family members or support persons with them as long as the person is not COVID-19 positive or experiencing its  symptoms.  ? ?Inpatient Visitation:   ? ?Visiting hours are 7 a.m. to 8 p.m. ?Up to four visitors are allowed at one time in a patient room, including children. The visitors may rotate out with other people during the day. One designated support person (adult) may remain overnight.  ?

## 2021-11-18 ENCOUNTER — Other Ambulatory Visit: Payer: Self-pay

## 2021-11-18 ENCOUNTER — Inpatient Hospital Stay
Admission: RE | Admit: 2021-11-18 | Discharge: 2021-11-20 | DRG: 455 | Disposition: A | Discharge status: Home Health | Payer: Medicare Other | Attending: Neurosurgery | Admitting: Neurosurgery

## 2021-11-18 ENCOUNTER — Inpatient Hospital Stay: Payer: Medicare Other | Admitting: Anesthesiology

## 2021-11-18 ENCOUNTER — Encounter: Payer: Self-pay | Admitting: Neurosurgery

## 2021-11-18 ENCOUNTER — Inpatient Hospital Stay: Payer: Medicare Other

## 2021-11-18 ENCOUNTER — Encounter
Admission: RE | Disposition: A | Discharge status: Home Health | Payer: Self-pay | Source: Home / Self Care | Attending: Neurosurgery

## 2021-11-18 DIAGNOSIS — M5416 Radiculopathy, lumbar region: Secondary | ICD-10-CM | POA: Diagnosis present

## 2021-11-18 DIAGNOSIS — M4316 Spondylolisthesis, lumbar region: Secondary | ICD-10-CM | POA: Diagnosis present

## 2021-11-18 DIAGNOSIS — K5900 Constipation, unspecified: Secondary | ICD-10-CM | POA: Diagnosis present

## 2021-11-18 DIAGNOSIS — Z01818 Encounter for other preprocedural examination: Secondary | ICD-10-CM

## 2021-11-18 DIAGNOSIS — Z7982 Long term (current) use of aspirin: Secondary | ICD-10-CM

## 2021-11-18 DIAGNOSIS — E7800 Pure hypercholesterolemia, unspecified: Secondary | ICD-10-CM | POA: Diagnosis present

## 2021-11-18 DIAGNOSIS — Z79899 Other long term (current) drug therapy: Secondary | ICD-10-CM

## 2021-11-18 DIAGNOSIS — Z87891 Personal history of nicotine dependence: Secondary | ICD-10-CM

## 2021-11-18 DIAGNOSIS — Z880 Allergy status to penicillin: Secondary | ICD-10-CM

## 2021-11-18 DIAGNOSIS — F209 Schizophrenia, unspecified: Secondary | ICD-10-CM | POA: Diagnosis present

## 2021-11-18 DIAGNOSIS — Z981 Arthrodesis status: Principal | ICD-10-CM

## 2021-11-18 HISTORY — PX: APPLICATION OF INTRAOPERATIVE CT SCAN: SHX6668

## 2021-11-18 HISTORY — PX: ANTERIOR LATERAL LUMBAR FUSION WITH PERCUTANEOUS SCREW 1 LEVEL: SHX5553

## 2021-11-18 LAB — CBC
HCT: 39.1 % (ref 39.0–52.0)
Hemoglobin: 13.1 g/dL (ref 13.0–17.0)
MCH: 31.8 pg (ref 26.0–34.0)
MCHC: 33.5 g/dL (ref 30.0–36.0)
MCV: 94.9 fL (ref 80.0–100.0)
Platelets: 265 10*3/uL (ref 150–400)
RBC: 4.12 MIL/uL — ABNORMAL LOW (ref 4.22–5.81)
RDW: 12.8 % (ref 11.5–15.5)
WBC: 9.8 10*3/uL (ref 4.0–10.5)
nRBC: 0 % (ref 0.0–0.2)

## 2021-11-18 LAB — SURGICAL PCR SCREEN
MRSA, PCR: NEGATIVE
Staphylococcus aureus: NEGATIVE

## 2021-11-18 LAB — CREATININE, SERUM
Creatinine, Ser: 1 mg/dL (ref 0.61–1.24)
GFR, Estimated: >60 mL/min (ref >60)

## 2021-11-18 SURGERY — ANTERIOR LATERAL LUMBAR FUSION WITH PERCUTANEOUS SCREW 1 LEVEL
Anesthesia: General | Site: Spine Lumbar

## 2021-11-18 MED ORDER — 0.9 % SODIUM CHLORIDE (POUR BTL) OPTIME
TOPICAL | Status: DC | PRN
  Administered 2021-11-18: 1000 mL

## 2021-11-18 MED ORDER — FENTANYL CITRATE (PF) 100 MCG/2ML IJ SOLN
25.0000 ug | INTRAMUSCULAR | Status: AC | PRN
  Administered 2021-11-18 (×7): 25 ug via INTRAVENOUS

## 2021-11-18 MED ORDER — KETOROLAC TROMETHAMINE 15 MG/ML IJ SOLN
15.0000 mg | Freq: Four times a day (QID) | INTRAMUSCULAR | Status: AC
  Administered 2021-11-18 – 2021-11-19 (×3): 15 mg via INTRAVENOUS
  Filled 2021-11-18 (×3): qty 1

## 2021-11-18 MED ORDER — FENTANYL CITRATE (PF) 100 MCG/2ML IJ SOLN
INTRAMUSCULAR | Status: DC | PRN
  Administered 2021-11-18 (×2): 50 ug via INTRAVENOUS

## 2021-11-18 MED ORDER — POLYETHYLENE GLYCOL 3350 17 G PO PACK
17.0000 g | PACK | Freq: Every day | ORAL | Status: DC | PRN
  Administered 2021-11-19: 17 g via ORAL
  Filled 2021-11-18: qty 1

## 2021-11-18 MED ORDER — BUPIVACAINE-EPINEPHRINE 0.5% -1:200000 IJ SOLN
INTRAMUSCULAR | Status: DC | PRN
  Administered 2021-11-18 (×2): 10 mL

## 2021-11-18 MED ORDER — HYDROMORPHONE HCL 1 MG/ML IJ SOLN
INTRAMUSCULAR | Status: AC
  Filled 2021-11-18: qty 1

## 2021-11-18 MED ORDER — CEFAZOLIN SODIUM-DEXTROSE 2-4 GM/100ML-% IV SOLN
2.0000 g | INTRAVENOUS | Status: AC
  Administered 2021-11-18: 2 g via INTRAVENOUS

## 2021-11-18 MED ORDER — PHENOL 1.4 % MT LIQD: 1.0000 | OROMUCOSAL | Status: DC | PRN

## 2021-11-18 MED ORDER — ORAL CARE MOUTH RINSE: 15.0000 mL | Freq: Once | OROMUCOSAL | Status: AC

## 2021-11-18 MED ORDER — BISACODYL 10 MG RE SUPP
10.0000 mg | Freq: Every day | RECTAL | Status: DC | PRN
  Administered 2021-11-20: 10 mg via RECTAL
  Filled 2021-11-18: qty 1

## 2021-11-18 MED ORDER — REMIFENTANIL HCL 1 MG IV SOLR
INTRAVENOUS | Status: AC
  Filled 2021-11-18: qty 1000

## 2021-11-18 MED ORDER — ESMOLOL HCL 100 MG/10ML IV SOLN
INTRAVENOUS | Status: DC | PRN
  Administered 2021-11-18: 30 mg via INTRAVENOUS

## 2021-11-18 MED ORDER — LIDOCAINE HCL (PF) 2 % IJ SOLN
INTRAMUSCULAR | Status: AC
  Filled 2021-11-18: qty 5

## 2021-11-18 MED ORDER — DEXAMETHASONE SODIUM PHOSPHATE 10 MG/ML IJ SOLN
INTRAMUSCULAR | Status: AC
Start: 2021-11-18 — End: ?
  Filled 2021-11-18: qty 1

## 2021-11-18 MED ORDER — MENTHOL 3 MG MT LOZG: 1.0000 | LOZENGE | OROMUCOSAL | Status: DC | PRN

## 2021-11-18 MED ORDER — PROPOFOL 10 MG/ML IV BOLUS
INTRAVENOUS | Status: AC
  Filled 2021-11-18: qty 20

## 2021-11-18 MED ORDER — SODIUM CHLORIDE 0.9 % IV SOLN: 250.0000 mL | INTRAVENOUS | Status: DC

## 2021-11-18 MED ORDER — CEFAZOLIN SODIUM-DEXTROSE 2-4 GM/100ML-% IV SOLN
INTRAVENOUS | Status: AC
  Filled 2021-11-18: qty 100

## 2021-11-18 MED ORDER — SODIUM CHLORIDE 0.9% FLUSH
3.0000 mL | Freq: Two times a day (BID) | INTRAVENOUS | Status: DC
  Administered 2021-11-19 (×2): 3 mL via INTRAVENOUS

## 2021-11-18 MED ORDER — SODIUM CHLORIDE (PF) 0.9 % IJ SOLN
INTRAMUSCULAR | Status: DC | PRN
  Administered 2021-11-18: 60 mL via INTRAMUSCULAR

## 2021-11-18 MED ORDER — FENTANYL CITRATE (PF) 100 MCG/2ML IJ SOLN
INTRAMUSCULAR | Status: AC
  Filled 2021-11-18: qty 2

## 2021-11-18 MED ORDER — VANCOMYCIN HCL IN DEXTROSE 1-5 GM/200ML-% IV SOLN: 1000.0000 mg | INTRAVENOUS | Status: DC

## 2021-11-18 MED ORDER — ACETAMINOPHEN 10 MG/ML IV SOLN
INTRAVENOUS | Status: DC | PRN
Start: 2021-11-18 — End: 2021-11-18
  Administered 2021-11-18: 1000 mg via INTRAVENOUS

## 2021-11-18 MED ORDER — MIDAZOLAM HCL 5 MG/5ML IJ SOLN
INTRAMUSCULAR | Status: DC | PRN
  Administered 2021-11-18 (×2): 1 mg via INTRAVENOUS

## 2021-11-18 MED ORDER — BUPIVACAINE HCL (PF) 0.5 % IJ SOLN
INTRAMUSCULAR | Status: AC
  Filled 2021-11-18: qty 30

## 2021-11-18 MED ORDER — ONDANSETRON HCL 4 MG/2ML IJ SOLN
INTRAMUSCULAR | Status: AC
  Filled 2021-11-18: qty 2

## 2021-11-18 MED ORDER — LACTATED RINGERS IV SOLN: INTRAVENOUS | Status: DC | PRN

## 2021-11-18 MED ORDER — EPHEDRINE 5 MG/ML INJ
INTRAVENOUS | Status: AC
Start: 2021-11-18 — End: ?
  Filled 2021-11-18: qty 5

## 2021-11-18 MED ORDER — LACTATED RINGERS IV SOLN: INTRAVENOUS | Status: DC

## 2021-11-18 MED ORDER — ENOXAPARIN SODIUM 40 MG/0.4ML IJ SOSY
40.0000 mg | PREFILLED_SYRINGE | INTRAMUSCULAR | Status: DC
  Administered 2021-11-19 – 2021-11-20 (×2): 40 mg via SUBCUTANEOUS
  Filled 2021-11-18 (×2): qty 0.4

## 2021-11-18 MED ORDER — PROPOFOL 1000 MG/100ML IV EMUL
INTRAVENOUS | Status: AC
  Filled 2021-11-18: qty 100

## 2021-11-18 MED ORDER — BUPIVACAINE-EPINEPHRINE (PF) 0.5% -1:200000 IJ SOLN
INTRAMUSCULAR | Status: AC
  Filled 2021-11-18: qty 30

## 2021-11-18 MED ORDER — MIDAZOLAM HCL 2 MG/2ML IJ SOLN
INTRAMUSCULAR | Status: AC
  Filled 2021-11-18: qty 2

## 2021-11-18 MED ORDER — METHOCARBAMOL 1000 MG/10ML IJ SOLN
500.0000 mg | Freq: Four times a day (QID) | INTRAVENOUS | Status: DC | PRN
  Administered 2021-11-18: 500 mg via INTRAVENOUS
  Filled 2021-11-18 (×2): qty 5

## 2021-11-18 MED ORDER — FLEET ENEMA 7-19 GM/118ML RE ENEM: 1.0000 | ENEMA | Freq: Once | RECTAL | Status: DC | PRN

## 2021-11-18 MED ORDER — REMIFENTANIL HCL 1 MG IV SOLR
INTRAVENOUS | Status: DC | PRN
  Administered 2021-11-18: .2 ug/kg/min via INTRAVENOUS

## 2021-11-18 MED ORDER — ONDANSETRON HCL 4 MG/2ML IJ SOLN
INTRAMUSCULAR | Status: DC | PRN
  Administered 2021-11-18: 4 mg via INTRAVENOUS

## 2021-11-18 MED ORDER — EPHEDRINE SULFATE (PRESSORS) 50 MG/ML IJ SOLN
INTRAMUSCULAR | Status: DC | PRN
  Administered 2021-11-18: 5 mg via INTRAVENOUS
  Administered 2021-11-18 (×2): 10 mg via INTRAVENOUS

## 2021-11-18 MED ORDER — MORPHINE SULFATE (PF) 2 MG/ML IV SOLN
2.0000 mg | INTRAVENOUS | Status: DC | PRN
  Administered 2021-11-19: 2 mg via INTRAVENOUS
  Filled 2021-11-18: qty 1

## 2021-11-18 MED ORDER — DEXAMETHASONE SODIUM PHOSPHATE 10 MG/ML IJ SOLN
INTRAMUSCULAR | Status: DC | PRN
  Administered 2021-11-18: 4 mg via INTRAVENOUS

## 2021-11-18 MED ORDER — HYDROMORPHONE HCL 1 MG/ML IJ SOLN
INTRAMUSCULAR | Status: DC | PRN
Start: 2021-11-18 — End: 2021-11-18
  Administered 2021-11-18: .25 mg via INTRAVENOUS

## 2021-11-18 MED ORDER — ONDANSETRON HCL 4 MG PO TABS: 4.0000 mg | ORAL_TABLET | Freq: Four times a day (QID) | ORAL | Status: DC | PRN

## 2021-11-18 MED ORDER — LIDOCAINE HCL (CARDIAC) PF 100 MG/5ML IV SOSY
PREFILLED_SYRINGE | INTRAVENOUS | Status: DC | PRN
  Administered 2021-11-18: 100 mg via INTRAVENOUS

## 2021-11-18 MED ORDER — DEXMEDETOMIDINE (PRECEDEX) IN NS 20 MCG/5ML (4 MCG/ML) IV SYRINGE
PREFILLED_SYRINGE | INTRAVENOUS | Status: DC | PRN
  Administered 2021-11-18: 8 ug via INTRAVENOUS
  Administered 2021-11-18: 12 ug via INTRAVENOUS

## 2021-11-18 MED ORDER — EPHEDRINE 5 MG/ML INJ
INTRAVENOUS | Status: AC
  Filled 2021-11-18: qty 5

## 2021-11-18 MED ORDER — BUPIVACAINE LIPOSOME 1.3 % IJ SUSP
INTRAMUSCULAR | Status: AC
  Filled 2021-11-18: qty 20

## 2021-11-18 MED ORDER — ACETAMINOPHEN 500 MG PO TABS
1000.0000 mg | ORAL_TABLET | Freq: Four times a day (QID) | ORAL | Status: AC
  Administered 2021-11-18 – 2021-11-19 (×4): 1000 mg via ORAL
  Filled 2021-11-18 (×4): qty 2

## 2021-11-18 MED ORDER — LURASIDONE HCL 40 MG PO TABS
40.0000 mg | ORAL_TABLET | Freq: Every day | ORAL | Status: DC
  Administered 2021-11-19: 40 mg via ORAL
  Filled 2021-11-18 (×3): qty 1

## 2021-11-18 MED ORDER — OXYCODONE HCL 5 MG PO TABS
10.0000 mg | ORAL_TABLET | ORAL | Status: DC | PRN
  Administered 2021-11-18 – 2021-11-20 (×6): 10 mg via ORAL
  Filled 2021-11-18 (×6): qty 2

## 2021-11-18 MED ORDER — FENTANYL CITRATE (PF) 100 MCG/2ML IJ SOLN
INTRAMUSCULAR | Status: AC
  Administered 2021-11-18: 25 ug via INTRAVENOUS
  Filled 2021-11-18: qty 2

## 2021-11-18 MED ORDER — FAMOTIDINE 20 MG PO TABS
ORAL_TABLET | ORAL | Status: AC
  Administered 2021-11-18: 20 mg via ORAL
  Filled 2021-11-18: qty 1

## 2021-11-18 MED ORDER — PROPOFOL 500 MG/50ML IV EMUL
INTRAVENOUS | Status: DC | PRN
  Administered 2021-11-18: 165 ug/kg/min via INTRAVENOUS

## 2021-11-18 MED ORDER — GLYCOPYRROLATE 0.2 MG/ML IJ SOLN
INTRAMUSCULAR | Status: DC | PRN
  Administered 2021-11-18: .2 mg via INTRAVENOUS

## 2021-11-18 MED ORDER — SODIUM CHLORIDE 0.9 % IV SOLN
INTRAVENOUS | Status: DC | PRN
  Administered 2021-11-18: 1000 mg via INTRAVENOUS

## 2021-11-18 MED ORDER — SODIUM CHLORIDE 0.9% FLUSH: 3.0000 mL | INTRAVENOUS | Status: DC | PRN

## 2021-11-18 MED ORDER — ONDANSETRON HCL 4 MG/2ML IJ SOLN: 4.0000 mg | Freq: Once | INTRAMUSCULAR | Status: DC | PRN

## 2021-11-18 MED ORDER — OXYCODONE HCL 5 MG PO TABS
5.0000 mg | ORAL_TABLET | ORAL | Status: DC | PRN
  Administered 2021-11-20: 5 mg via ORAL
  Filled 2021-11-18: qty 1

## 2021-11-18 MED ORDER — ACETAMINOPHEN 10 MG/ML IV SOLN
INTRAVENOUS | Status: AC
  Filled 2021-11-18: qty 100

## 2021-11-18 MED ORDER — SURGIFLO WITH THROMBIN (HEMOSTATIC MATRIX KIT) OPTIME
TOPICAL | Status: DC | PRN
Start: 2021-11-18 — End: 2021-11-18
  Administered 2021-11-18: 1 via TOPICAL

## 2021-11-18 MED ORDER — METHOCARBAMOL 500 MG PO TABS
500.0000 mg | ORAL_TABLET | Freq: Four times a day (QID) | ORAL | Status: DC | PRN
  Administered 2021-11-19 – 2021-11-20 (×3): 500 mg via ORAL
  Filled 2021-11-18 (×3): qty 1

## 2021-11-18 MED ORDER — EPHEDRINE SULFATE (PRESSORS) 50 MG/ML IJ SOLN
INTRAMUSCULAR | Status: AC
  Filled 2021-11-18: qty 1

## 2021-11-18 MED ORDER — KETOROLAC TROMETHAMINE 15 MG/ML IJ SOLN
INTRAMUSCULAR | Status: AC
  Administered 2021-11-19: 15 mg via INTRAVENOUS
  Filled 2021-11-18: qty 1

## 2021-11-18 MED ORDER — CHLORHEXIDINE GLUCONATE 0.12 % MT SOLN
OROMUCOSAL | Status: AC
  Administered 2021-11-18: 15 mL via OROMUCOSAL
  Filled 2021-11-18: qty 15

## 2021-11-18 MED ORDER — FAMOTIDINE 20 MG PO TABS: 20.0000 mg | ORAL_TABLET | Freq: Once | ORAL | Status: AC

## 2021-11-18 MED ORDER — SODIUM CHLORIDE 0.9 % IV SOLN: INTRAVENOUS | Status: DC

## 2021-11-18 MED ORDER — CHLORHEXIDINE GLUCONATE 0.12 % MT SOLN: 15.0000 mL | Freq: Once | OROMUCOSAL | Status: AC

## 2021-11-18 MED ORDER — ONDANSETRON HCL 4 MG/2ML IJ SOLN: 4.0000 mg | Freq: Four times a day (QID) | INTRAMUSCULAR | Status: DC | PRN

## 2021-11-18 MED ORDER — SENNA 8.6 MG PO TABS
1.0000 | ORAL_TABLET | Freq: Two times a day (BID) | ORAL | Status: DC
  Administered 2021-11-18 – 2021-11-20 (×4): 8.6 mg via ORAL
  Filled 2021-11-18 (×4): qty 1

## 2021-11-18 MED ORDER — SUCCINYLCHOLINE CHLORIDE 200 MG/10ML IV SOSY
PREFILLED_SYRINGE | INTRAVENOUS | Status: DC | PRN
  Administered 2021-11-18: 100 mg via INTRAVENOUS

## 2021-11-18 MED ORDER — PROPOFOL 10 MG/ML IV BOLUS
INTRAVENOUS | Status: DC | PRN
  Administered 2021-11-18: 200 mg via INTRAVENOUS

## 2021-11-18 MED ORDER — QUETIAPINE FUMARATE 25 MG PO TABS
50.0000 mg | ORAL_TABLET | Freq: Every day | ORAL | Status: DC
  Administered 2021-11-18: 50 mg via ORAL
  Administered 2021-11-19: 100 mg via ORAL
  Filled 2021-11-18 (×3): qty 2

## 2021-11-18 MED ORDER — DOCUSATE SODIUM 100 MG PO CAPS
100.0000 mg | ORAL_CAPSULE | Freq: Two times a day (BID) | ORAL | Status: DC
  Administered 2021-11-18 – 2021-11-20 (×4): 100 mg via ORAL
  Filled 2021-11-18 (×4): qty 1

## 2021-11-18 MED ORDER — VANCOMYCIN HCL IN DEXTROSE 1-5 GM/200ML-% IV SOLN
INTRAVENOUS | Status: AC
  Filled 2021-11-18: qty 200

## 2021-11-18 MED ORDER — SODIUM CHLORIDE FLUSH 0.9 % IV SOLN
INTRAVENOUS | Status: AC
  Filled 2021-11-18: qty 20

## 2021-11-18 SURGICAL SUPPLY — 73 items
BUR NEURO DRILL SOFT 3.0X3.8M (BURR) IMPLANT
CAP LOCKING SPINE (Cap) ×4 IMPLANT
CHLORAPREP W/TINT 26 (MISCELLANEOUS) ×6 IMPLANT
CORD BIP STRL DISP 12FT (MISCELLANEOUS) ×2 IMPLANT
CORD LIGHT LATERIAL X LIFT (MISCELLANEOUS) ×1 IMPLANT
COUNTER NEEDLE 20/40 LG (NEEDLE) ×4 IMPLANT
COVER BACK TABLE REUSABLE LG (DRAPES) ×2 IMPLANT
CUP MEDICINE 2OZ PLAST GRAD ST (MISCELLANEOUS) ×2 IMPLANT
DERMABOND ADVANCED (GAUZE/BANDAGES/DRESSINGS) ×3
DERMABOND ADVANCED .7 DNX12 (GAUZE/BANDAGES/DRESSINGS) ×3 IMPLANT
DRAPE 3D C-ARM OEC (DRAPES) ×1 IMPLANT
DRAPE C ARM PK CFD 31 SPINE (DRAPES) ×2 IMPLANT
DRAPE INCISE IOBAN 66X45 STRL (DRAPES) ×2 IMPLANT
DRAPE LAPAROTOMY 100X77 ABD (DRAPES) ×4 IMPLANT
DRAPE MICROSCOPE SPINE 48X150 (DRAPES) IMPLANT
DRAPE SCAN PATIENT (DRAPES) ×2 IMPLANT
DRAPE SURG 17X11 SM STRL (DRAPES) ×4 IMPLANT
DRSG OPSITE POSTOP 4X6 (GAUZE/BANDAGES/DRESSINGS) ×3 IMPLANT
ELECT CAUTERY BLADE TIP 2.5 (TIP) ×2
ELECT EZSTD 165MM 6.5IN (MISCELLANEOUS) ×2
ELECT REM PT RETURN 9FT ADLT (ELECTROSURGICAL) ×4
ELECTRODE CAUTERY BLDE TIP 2.5 (TIP) ×1 IMPLANT
ELECTRODE EZSTD 165MM 6.5IN (MISCELLANEOUS) ×1 IMPLANT
ELECTRODE REM PT RTRN 9FT ADLT (ELECTROSURGICAL) ×2 IMPLANT
FEE INTRAOP CADWELL SUPPLY NCS (MISCELLANEOUS) ×1 IMPLANT
FEE INTRAOP MONITOR IMPULS NCS (MISCELLANEOUS) ×1 IMPLANT
FORCEPS BPLR BAYO 10IN 1.0TIP (ORTHOPEDIC DISPOSABLE SUPPLIES) ×1 IMPLANT
GAUZE 4X4 16PLY ~~LOC~~+RFID DBL (SPONGE) ×2 IMPLANT
GLOVE BIOGEL PI IND STRL 6.5 (GLOVE) ×3 IMPLANT
GLOVE BIOGEL PI INDICATOR 6.5 (GLOVE) ×3
GLOVE SURG SYN 6.5 ES PF (GLOVE) ×10 IMPLANT
GLOVE SURG SYN 6.5 PF PI (GLOVE) ×5 IMPLANT
GLOVE SURG SYN 8.5  E (GLOVE) ×6
GLOVE SURG SYN 8.5 E (GLOVE) ×6 IMPLANT
GLOVE SURG SYN 8.5 PF PI (GLOVE) ×6 IMPLANT
GOWN SRG LRG LVL 4 IMPRV REINF (GOWNS) ×2 IMPLANT
GOWN SRG XL LVL 3 NONREINFORCE (GOWNS) ×2 IMPLANT
GOWN STRL NON-REIN TWL XL LVL3 (GOWNS) ×2
GOWN STRL REIN LRG LVL4 (GOWNS) ×2
GRADUATE 1200CC STRL 31836 (MISCELLANEOUS) ×2 IMPLANT
INTRAOP CADWELL SUPPLY FEE NCS (MISCELLANEOUS) ×1
INTRAOP DISP SUPPLY FEE NCS (MISCELLANEOUS) ×1
INTRAOP MONITOR FEE IMPULS NCS (MISCELLANEOUS) ×1
INTRAOP MONITOR FEE IMPULSE (MISCELLANEOUS) ×1
K-WIRE 1.6 NITINOL SHARP TIP (WIRE) ×8
KIT DISP MARS 3V (KITS) ×1 IMPLANT
KIT INFUSE MEDIUM (Orthopedic Implant) ×1 IMPLANT
KIT PEDICLE ACCESS (KITS) ×1 IMPLANT
KIT SPINAL PRONEVIEW (KITS) ×2 IMPLANT
KIT TURNOVER KIT A (KITS) ×2 IMPLANT
KWIRE 1.6 NITINOL SHARP TIP (WIRE) IMPLANT
MANIFOLD NEPTUNE II (INSTRUMENTS) ×4 IMPLANT
MARKER SKIN DUAL TIP RULER LAB (MISCELLANEOUS) ×7 IMPLANT
MARKER SPHERE PSV REFLC 13MM (MARKER) ×16 IMPLANT
NDL SAFETY ECLIPSE 18X1.5 (NEEDLE) ×1 IMPLANT
NEEDLE HYPO 18GX1.5 SHARP (NEEDLE) ×1
NEEDLE HYPO 22GX1.5 SAFETY (NEEDLE) ×2 IMPLANT
NS IRRIG 1000ML POUR BTL (IV SOLUTION) ×1 IMPLANT
PACK LAMINECTOMY NEURO (CUSTOM PROCEDURE TRAY) ×2 IMPLANT
PAD ARMBOARD 7.5X6 YLW CONV (MISCELLANEOUS) ×3 IMPLANT
PENCIL ELECTRO HAND CTR (MISCELLANEOUS) ×2 IMPLANT
ROD SPINAL CREO AMP 5.5X60 CAN (Rod) ×2 IMPLANT
SCREW CREO 6.5X45 FEN (Screw) ×4 IMPLANT
SCREW CREO MIS 30 TULIP (Screw) ×4 IMPLANT
SPACER HEDRON 10D 18X45X11 (Spacer) ×1 IMPLANT
SURGIFLO W/THROMBIN 8M KIT (HEMOSTASIS) ×2 IMPLANT
SUT DVC VLOC 3-0 CL 6 P-12 (SUTURE) ×2 IMPLANT
SUT VIC AB 0 CT1 27 (SUTURE) ×1
SUT VIC AB 0 CT1 27XCR 8 STRN (SUTURE) ×1 IMPLANT
SUT VIC AB 2-0 CT1 18 (SUTURE) ×3 IMPLANT
SYR 30ML LL (SYRINGE) ×4 IMPLANT
TOWEL OR 17X26 4PK STRL BLUE (TOWEL DISPOSABLE) ×10 IMPLANT
TUBING CONNECTING 10 (TUBING) ×5 IMPLANT

## 2021-11-18 NOTE — H&P (Signed)
I have reviewed and confirmed my history and physical from 10/22/2021 with no additions or changes. Plan for L4-5 XLIF/PSF.  Risks and benefits reviewed. ? ?Heart sounds normal no MRG. Chest Clear to Auscultation Bilaterally. ? ? ?  ? ?

## 2021-11-18 NOTE — Progress Notes (Signed)
Pharmacy Antibiotic Note ? ?Justin Nixon is a 46 y.o. male admitted for planned surgical procedure.  Pharmacy has been consulted for Vancomycin and cefazolin pre-op dosing. ? ?Plan: ?Vancomycin 1g IV (~15mg /kg) 60 minutes pre-op ordered.  ? ?Cefazolin 2g IV Pre-op ordered.  ? ?  ?Allergies  ?Allergen Reactions  ? Penicillins Itching  ?  TOLERATED CEFAZOLIN 10/10/2020 ?Has patient had a PCN reaction causing immediate rash, facial/tongue/throat swelling, SOB or lightheadedness with hypotension: No ?Has patient had a PCN reaction causing severe rash involving mucus membranes or skin necrosis: No ?Has patient had a PCN reaction that required hospitalization No ?Has patient had a PCN reaction occurring within the last 10 years: Yes ?If all of the above answers are "NO", then may proceed with Cephalosporin use. ?  ? ? ?Thank you for allowing pharmacy to be a part of this patient?s care. ? ?Gardner Candle, PharmD, BCPS ?Clinical Pharmacist ?11/18/2021 ?10:43 AM ? ?

## 2021-11-18 NOTE — Anesthesia Procedure Notes (Signed)
Procedure Name: Intubation ?Date/Time: 11/18/2021 12:14 PM ?Performed by: Loletha Grayer, CRNA ?Pre-anesthesia Checklist: Patient identified, Patient being monitored, Timeout performed, Emergency Drugs available and Suction available ?Patient Re-evaluated:Patient Re-evaluated prior to induction ?Oxygen Delivery Method: Circle system utilized ?Preoxygenation: Pre-oxygenation with 100% oxygen ?Induction Type: IV induction ?Ventilation: Mask ventilation without difficulty ?Laryngoscope Size: McGraph and 4 ?Grade View: Grade I ?Tube type: Oral ?Tube size: 7.5 mm ?Number of attempts: 1 ?Airway Equipment and Method: Stylet ?Placement Confirmation: ETT inserted through vocal cords under direct vision, positive ETCO2 and breath sounds checked- equal and bilateral ?Secured at: 22 cm ?Tube secured with: Tape ?Dental Injury: Teeth and Oropharynx as per pre-operative assessment  ? ? ? ? ?

## 2021-11-18 NOTE — Op Note (Signed)
Indications: Justin Nixon is a 46 yo male who presented with: ?Anterolisthesis M43.10, Lumbar radiculopathy M54.16 ? ?He failed conservative management prompting surgical intervention. ? ?Findings: correction of anterolisthesis ? ?Preoperative Diagnosis: Anterolisthesis M43.10, Lumbar radiculopathy M54.16 ?Postoperative Diagnosis: same ? ? ?EBL: 100 ml ?IVF: see AR ml ?Drains: none ?Disposition: Extubated and Stable to PACU ?Complications: none ? ?A foley catheter was placed. ? ? ?Preoperative Note:  ? ?Risks of surgery discussed include: infection, bleeding, stroke, coma, death, paralysis, CSF leak, nerve/spinal cord injury, numbness, tingling, weakness, complex regional pain syndrome, recurrent stenosis and/or disc herniation, vascular injury, development of instability, neck/back pain, need for further surgery, persistent symptoms, development of deformity, and the risks of anesthesia. The patient understood these risks and agreed to proceed. ? ?NAME OF ANTERIOR PROCEDURE:               ?1. Anterior lumbar interbody fusion via a left lateral retroperitoneal approach at L4/5 ?2. Placement of a Lordotic Globus Hedron interbody cage, filled with BMP ? ? ? ?NAME OF POSTERIOR PROCEDURE: ?1. Posterior instrumentation using Globus Creo Instrumentation ?2. Posterolateral fusion, L4-5  ?  ? ? ?PROCEDURE:  Patient was brought to the operating room, intubated, turned to the lateral position.  All pressure points were checked and double-checked.  The patient was prepped and draped in the standard fashion. Prior to prepping, fluoroscopy was brought in and the patient was positioned with a large bump under the contralateral side between the iliac crest and rib cage, allowing the area between the iliac crest and the lateral aspect of the rib cage to open and increase the ability to reach inferiorly, to facilitate entry into the disc space.  The incision was marked upon the skin both the location of the disc space as well as the  superior most aspect of the iliac crest.  Based on the identification of the disc space an incision was prepared, marked upon the skin and eventually was used for our lateral incision.  The fluoroscopy was turned into a cross table A/P image in order to confirm that the patient?s spine remained in a perpendicular trajectory to the floor without rotation.  Once confirming that all the pressure points were checked and double-checked and the patient remained in sturdy position strapped down in this slightly jack-knifed lateral position, the patient was prepped and draped in standard fashion. ? ?The skin was injected with local anesthetic, then incised until the abdominal wall fascia was noted.  I bluntly dissected posteriorly until we were able to identify the posterior musculature near petit?s triangle.  At this point, using primarily blunt dissection with our finger aided with a metzenbaum scissor, were able to enter the retroperitoneal cavity.  The retroperitoneal potential space was opened further until palpating out the psoas muscle, the medial aspect of the iliac crest, the medial aspect of the last rib and continued to define the retroperitoneal space with blunt dissection in order to facilitate safe placement of our dilators.   ? ?While protecting by dissecting directly onto a finger in the retroperitoneum, the retroperitoneal space was entered safely from the lateral incision and the initial dilator placed onto the muscle belly of the psoas.  While directly stimulating the dilator and after radiographically confirming our location relative to the disc space, I placed the dilator through the psoas.  The dilators were stimulated to ensure remaining safely away from any of the lumbar plexus nerves; the dilators were repositioned until no pathologic stimulation was appreciated.  Once I had confirmed  the location of our initial dilator radiographically, a K-wire secured the dilator into the L4/5 disc space and  confirmed position under A/P and lateral fluoroscopy.  At this point, I dilated up with direct stimulation to confirm lack of pathologic stimulation. ? ?Once all the dilators were in position, I placed in the retractor and secured it onto the table, locked into position and confirmed under A/P and lateral fluoroscopy to confirm our approach angle to the disc space as well as location relative to the disc space.  I then placed the muscle stimulator in through the working channel down to the vertebral body, stimulating the entire lateral surface of the vertebral body and any of the visualized psoas muscle that was adjacent to the retractor, confirming again the safe passage to the psoas before we began performing the discectomy.  At this point, we began our discectomy at L4/5. ? ?The disc was incised laterally throughout the extent of our exposure. Using a combination of pituitary rongeurs, Kerrison rongeurs, rasps, curettes of various sorts, we were able to begin to clean out the disc space.  Once we had cleaned out the majority of the disc space, we then cut the lateral annulus with a cob, breaking the lateral annual attachments on the contralateral side by subtly working the cob through the annulus while using flouroscopy.  Care was taken not to extend further than required after cutting the annular attachments.  After this had been performed, we prepared the endplates for placement of our graft, sized a graft to the disc space by serially dilating up in trial sizes until we confirmed that our graft would be well positioned, allowing distraction while maintaining good grip.  This was confirmed under A/P and lateral fluoroscopy in order to ensure its placement as an eventual trial for placement of our final graft.  We irrigated with bacteriostatic saline.  Once confirmed placement, the Hedron implant filled with allograft was impacted into position at L4/5. ? ? ?Through a combination of intradiscal distraction and  anterior releasing, we were able to correct the anterior deformity during disc preparation and placement of the graft. ? ?At this point, final radiographs were performed, and we began closure.  The wound was closed using 0 Vicryl interrupted suture in the fascia and 2-0 Vicryl inverted suture were placed in the subcutaneous tissue and dermis. 3-0 monocryl was used for final closure. Dermabond was used to close the skin.   ? ?After closing the anterior part in layers, the patient was repositioned into prone position.  All pressure points were checked and double-checked. ? ?The posterior operative site was prepped and draped in standard fashion.  The stereotactic array was placed.  Stereotactic images were acquired using intraoperative CT scanning.  This was registered to the patient.  Using stereotaxis, screw trajectories were planned and incisions made.  The pedicles from L4 to L5 were cannulated bilaterally and K wires used to secure the tracks.  We then utilized a stereotactic screwdriver to place pedicle screws from L4 to L5. ? ?At each level, 6.5x41mm Globus Creo pedicle screws were placed. ? ?Once the screws were placed, the screw extensions were then linked, a path was formed for the rod and a rod was utilized to connect the screws.  We then compressed, torqued / counter-torqued and removed the screw assembly. Once performed on each side, the C-arm was brought back and to take confirmatory CT scan showing appropriate placement of all instrumentation and anatomic alignment.   ? ?Posterolateral arthrodesis was  performed at L4-L5. ? ?Again we confirmed radiographically and began our closure.  The wound was closed using 0 Vicryl interrupted suture in the fascia, 2-0 Vicryl inverted suture were placed in the subcutaneous tissue and dermis. 3-0 monocryl was used for final closure. Dermabond was used to close the skin.   ? ?Needle, lap and all counts were correct at the end of the case.   ? ?There was no pathologic  change in the neuromonitoring during the procedure. ? ? ?Manning Charity PA assisted in the entire procedure. ? ?Venetia Night MD ?Neurosurgery ? ? ? ? ?

## 2021-11-18 NOTE — Transfer of Care (Signed)
Immediate Anesthesia Transfer of Care Note ? ?Patient: Justin Nixon ? ?Procedure(s) Performed: L4-5 LATERAL LUMBAR INTERBODY FUSION WITH POSTERIOR SPINAL FUSION (Spine Lumbar) ?APPLICATION OF INTRAOPERATIVE CT SCAN (Spine Lumbar) ? ?Patient Location: PACU ? ?Anesthesia Type:General ? ?Level of Consciousness: drowsy ? ?Airway & Oxygen Therapy: Patient Spontanous Breathing and Patient connected to face mask oxygen ? ?Post-op Assessment: Report given to RN and Post -op Vital signs reviewed and stable ? ?Post vital signs: Reviewed and stable ? ?Last Vitals:  ?Vitals Value Taken Time  ?BP 135/98 11/18/21 1510  ?Temp    ?Pulse 96 11/18/21 1508  ?Resp 14 11/18/21 1510  ?SpO2 100 % 11/18/21 1508  ?Vitals shown include unvalidated device data. ? ?Last Pain:  ?Vitals:  ? 11/18/21 1109  ?TempSrc: Oral  ?PainSc: 10-Worst pain ever  ?   ? ?  ? ?Complications: No notable events documented. ?

## 2021-11-18 NOTE — Anesthesia Preprocedure Evaluation (Signed)
Anesthesia Evaluation  ?Patient identified by MRN, date of birth, ID band ?Patient awake ? ? ? ?Reviewed: ?Allergy & Precautions, H&P , NPO status , Patient's Chart, lab work & pertinent test results, reviewed documented beta blocker date and time  ? ?Airway ?Mallampati: II ? ?TM Distance: >3 FB ?Neck ROM: full ? ? ? Dental ? ?(+) Teeth Intact ?  ?Pulmonary ?neg pulmonary ROS, Current Smoker and Patient abstained from smoking.,  ?  ?Pulmonary exam normal ? ? ? ? ? ? ? Cardiovascular ?Exercise Tolerance: Good ?negative cardio ROS ?Normal cardiovascular exam ?Rhythm:regular Rate:Normal ? ? ?  ?Neuro/Psych ?PSYCHIATRIC DISORDERS Anxiety Schizophrenia negative neurological ROS ?   ? GI/Hepatic ?Neg liver ROS, GERD  Medicated,  ?Endo/Other  ?negative endocrine ROS ? Renal/GU ?negative Renal ROS  ?negative genitourinary ?  ?Musculoskeletal ? ? Abdominal ?  ?Peds ? Hematology ?negative hematology ROS ?(+)   ?Anesthesia Other Findings ?Past Medical History: ?No date: Anxiety ?No date: Arthritis ?No date: GERD (gastroesophageal reflux disease) ?    Comment:  H/O ?No date: Hyperlipidemia ?No date: Schizophrenia [facility]) ?Past Surgical History: ?No date: COLONOSCOPY ?No date: CYST EXCISION ?10/10/2020: ELBOW ARTHROSCOPY; Left ?    Comment:  Procedure: LEFT ELBOW ARTHROSCOPY WITH DEBRIDEMENT AND  ?             EXCISION OF LOOSE BODIES;  Surgeon: Christena Flake, MD;   ?             Location: [facility] ORS;  Service: Orthopedics;  Laterality:  ?             Left; ?No date: MYRINGOTOMY WITH TUBE PLACEMENT ?08/28/2021: ORIF ELBOW FRACTURE; Left ?    Comment:  Procedure: OPEN REDUCTION INTERNAL FIXATION (ORIF)  ?             ELBOW/OLECRANON FRACTURE;  Surgeon: Christena Flake, MD;   ?             Location: [facility] OR[facility];  Service: Orthopedics;  Laterality:  ?             Left; ?03/18/2017: SHOULDER ARTHROSCOPY; Left ?    Comment:  Procedure: ARTHROSCOPY SHOULDER with debridement  ?             decompression and distal  clavicle excision;  Surgeon:  ?             Christena Flake, MD;  Location: [facility];   ?             Service: Orthopedics;  Laterality: Left; ?09/23/2016: SHOULDER ARTHROSCOPY WITH OPEN ROTATOR CUFF REPAIR; Right ?    Comment:  Procedure: SHOULDER ARTHROSCOPY WITH OPEN ROTATOR CUFF  ?             REPAIR;  Surgeon: Christena Flake, MD;  Location: [facility] ORS;  ?             Service: Orthopedics;  Laterality: Right; ?09/23/2016: SHOULDER ARTHROSCOPY WITH SUBACROMIAL DECOMPRESSION; Right ?    Comment:  Procedure: SHOULDER ARTHROSCOPY WITH SUBACROMIAL  ?             DECOMPRESSION AND DEBRIDEMENT;  Surgeon: Excell Seltzer Poggi,  ?             MD;  Location: [facility] ORS;  Service: Orthopedics;   ?             Laterality: Right; ? ? Reproductive/Obstetrics ?negative OB ROS ? ?  ? ? ? ? ? ? ? ? ? ? ? ? ? ?  ?  ? ? ? ? ? ? ? ? ?  Anesthesia Physical ?Anesthesia Plan ? ?ASA: 2 ? ?Anesthesia Plan: General ETT  ? ?Post-op Pain Management:   ? ?Induction:  ? ?PONV Risk Score and Plan: 2 ? ?Airway Management Planned:  ? ?Additional Equipment:  ? ?Intra-op Plan:  ? ?Post-operative Plan:  ? ?Informed Consent: I have reviewed the patients History and Physical, chart, labs and discussed the procedure including the risks, benefits and alternatives for the proposed anesthesia with the patient or authorized representative who has indicated his/her understanding and acceptance.  ? ? ? ?Dental Advisory Given ? ?Plan Discussed with: CRNA ? ?Anesthesia Plan Comments:   ? ? ? ? ? ? ?Anesthesia Quick Evaluation ? ?

## 2021-11-19 ENCOUNTER — Encounter: Payer: Self-pay | Admitting: Neurosurgery

## 2021-11-19 NOTE — Discharge Instructions (Signed)
NEUROSURGERY DISCHARGE INSTRUCTIONS ? ?Admission diagnosis: S/P spinal fusion [Z98.1] ? ?Operative procedure: L4-5 XLIF  ? ?What to do after you leave the hospital: ? ?Recommended diet: regular diet. Increase protein intake to promote wound healing. ? ?Recommended activity: no lifting, driving, or strenuous exercise for 4 weeks . You should walk multiple times per day ? ?Special Instructions ? ?*hold home Aspirin for 14 days post-op ? ?No straining, no heavy lifting > 10lbs x 4 weeks.  Keep incision area clean and dry. May shower in 2 days. No baths or pools for 6 weeks.  ?Please remove dressing tomorrow, no need to apply a bandage afterwards ? ?You have no sutures to remove, the skin is closed with adhesive ? ?Please take pain medications as directed. Take a stool softener if on pain medications ? ? ?Please Report any of the following: ?Nausea or Vomiting, Temperature is greater than 101.65F (38.1C) degrees, Dizziness, Abdominal Pain, Difficulty Breathing or Shortness of Breath, Inability to Eat, drink Fluids, or Take medications, Bleeding, swelling, or drainage from surgical incision sites, New numbness or weakness, and Bowel or bladder dysfunction to the neurosurgeon on call at [phone] ? ?Additional Follow up appointments ?Please follow up with Cooper Render PA-C in [place] [facility] as scheduled in 2-3 weeks ? ? ?Please see below for scheduled appointments: ? ?No future appointments. ? ?  ?

## 2021-11-19 NOTE — Evaluation (Signed)
Physical Therapy Evaluation ?Patient Details ?Name: Justin PatchKeith L Nixon ?MRN: [id number] ?DOB: 05/05/1976 ?Today's Date: 11/19/2021 ? ?History of Present Illness ? Pt is a 46 yo male diagnosed with lumbar radiculopathy and anterolisthesis and now s/p L4-5 XLIF/PSF on 11/18/21. PMHx includes schizophrenia, anxiety, GERD, HLD, and L elbow fx s/p ORIF 08/28/21. ?  ?Clinical Impression ? Pt was pleasant and motivated to participate during the session and put forth good effort throughout. Pt required cuing for proper sequencing for safety and back precaution compliance with transfers and gait.  Pt was able to amb a max of around 12 feet with very slow, step-to pattern with occasional min A secondary to BLE buckling.  Pt reported no adverse symptoms during the session other than low back pain that did not worsen with activity.  Anticipate pt will make good progress while in acute care but will follow closely with possible change in discharge recommendations if progress is slow.  Pt will benefit from HHPT upon discharge to safely address deficits listed in patient problem list for decreased caregiver assistance and eventual return to PLOF. ? ?   ?   ? ?Recommendations for follow up therapy are one component of a multi-disciplinary discharge planning process, led by the attending physician.  Recommendations may be updated based on patient status, additional functional criteria and insurance authorization. ? ?Follow Up Recommendations Home health PT ? ?  ?Assistance Recommended at Discharge Frequent or constant Supervision/Assistance  ?Patient can return home with the following ? A little help with walking and/or transfers;A little help with bathing/dressing/bathroom;Assistance with cooking/housework;Direct supervision/assist for medications management;Assist for transportation;Help with stairs or ramp for entrance ? ?  ?Equipment Recommendations Rolling walker (2 wheels)  ?Recommendations for Other Services ?    ?  ?Functional Status  Assessment Patient has had a recent decline in their functional status and demonstrates the ability to make significant improvements in function in a reasonable and predictable amount of time.  ? ?  ?Precautions / Restrictions Precautions ?Precautions: Back;Fall ?Precaution Booklet Issued: Yes (comment) ?Restrictions ?Weight Bearing Restrictions: No ?Other Position/Activity Restrictions: watch for buckling; no brace needed  ? ?  ? ?Mobility ? Bed Mobility ?  ?  ?  ?  ?  ?  ?  ?General bed mobility comments: NT, pt in recliner ?  ? ?Transfers ?Overall transfer level: Needs assistance ?Equipment used: Rolling walker (2 wheels) ?Transfers: Sit to/from Stand ?Sit to Stand: Min guard ?  ?Step pivot transfers: Min guard ?  ?  ?  ?General transfer comment: Mod verbal cues for sequencing for back precaution compliance ?  ? ?Ambulation/Gait ?Ambulation/Gait assistance: Min assist ?Gait Distance (Feet): 12 Feet ?Assistive device: Rolling walker (2 wheels) ?Gait Pattern/deviations: Step-to pattern, Decreased step length - left, Decreased step length - right ?Gait velocity: decreased ?  ?  ?General Gait Details: Slow cadence with step-to pattern and occasional min A for stability, mod verbal cues for sequencing ? ?Stairs ?  ?  ?  ?  ?  ? ?Wheelchair Mobility ?  ? ?Modified Rankin (Stroke Patients Only) ?  ? ?  ? ?Balance Overall balance assessment: Needs assistance ?Sitting-balance support: No upper extremity supported, Feet supported ?Sitting balance-Leahy Scale: Good ?  ?  ?Standing balance support: Bilateral upper extremity supported, Reliant on assistive device for balance, During functional activity ?Standing balance-Leahy Scale: Poor ?Standing balance comment: BLE buckling requiring occasional min A to correct with heavy reliance on RW ?  ?  ?  ?  ?  ?  ?  ?  ?  ?  ?  ?   ? ? ? ?  Pertinent Vitals/Pain Pain Assessment ?Pain Assessment: 0-10 ?Pain Score: 7  ?Pain Location: lumbar back ?Pain Descriptors / Indicators:  Aching ?Pain Intervention(s): Repositioned, Premedicated before session, Monitored during session  ? ? ?Home Living Family/patient expects to be discharged to:: Private residence ?Living Arrangements: Spouse/significant other ?Available Help at Discharge: Family;Available PRN/intermittently ?Type of Home: Mobile home ?Home Access: Stairs to enter ?Entrance Stairs-Rails: Right;Left;Can reach both ?Entrance Stairs-Number of Steps: 6 ?  ?Home Layout: One level ?Home Equipment: Shower seat;Cane - quad;Hand held shower head ?Additional Comments: might be able to borrow his mother's 2WW (~16yr old, unknown condition), per discussion with patient will order RW unless pt able to confirm availability and good condition of mother's RW  ?  ?Prior Function Prior Level of Function : Independent/Modified Independent;History of Falls (last six months) ?  ?  ?  ?  ?  ?  ?Mobility Comments: Mod Ind amb with QC household distances, facility w/c for MD visists,  extensive fall history (~15 falls/31mo 2/2 BLE buckling) ?ADLs Comments: sponge baths, limited in standing tolerance for ADL to 5-35min, hasn't driven due to BLE impairments ?  ? ? ?Hand Dominance  ?   ? ?  ?Extremity/Trunk Assessment  ? Upper Extremity Assessment ?Upper Extremity Assessment: Overall WFL for tasks assessed ?  ? ?Lower Extremity Assessment ?Lower Extremity Assessment: RLE deficits/detail;LLE deficits/detail ?RLE Deficits / Details: RLE strength grossly 3+ to 4-/5 but somewhat limited by pain ?RLE: Unable to fully assess due to pain ?RLE Sensation: WNL ?RLE Coordination: WNL ?LLE Deficits / Details: LLE strength grossly 3+ to 4-/5 but somewhat limited by pain ?LLE: Unable to fully assess due to pain ?LLE Sensation: WNL ?LLE Coordination: WNL ?  ? ?Cervical / Trunk Assessment ?Cervical / Trunk Assessment: Back Surgery  ?Communication  ? Communication: No difficulties  ?Cognition Arousal/Alertness: Awake/alert ?Behavior During Therapy: [facility] for tasks  assessed/performed ?Overall Cognitive Status: Within Functional Limits for tasks assessed ?  ?  ?  ?  ?  ?  ?  ?  ?  ?  ?  ?  ?  ?  ?  ?  ?  ?  ?  ? ?  ?General Comments   ? ?  ?Exercises Other Exercises ?Other Exercises: Log roll sequencing verbal review/education ?Other Exercises: Back precaution education and review  ? ?Assessment/Plan  ?  ?PT Assessment Patient needs continued PT services  ?PT Problem List Decreased strength;Decreased activity tolerance;Decreased balance;Decreased mobility;Decreased knowledge of use of DME;Pain;Decreased knowledge of precautions ? ?   ?  ?PT Treatment Interventions DME instruction;Gait training;Stair training;Functional mobility training;Therapeutic activities;Therapeutic exercise;Balance training;Patient/family education   ? ?PT Goals (Current goals can be found in the Care Plan section)  ?Acute Rehab PT Goals ?Patient Stated Goal: To get back to work and fishing ?PT Goal Formulation: With patient ?Time For Goal Achievement: 12/02/21 ?Potential to Achieve Goals: Good ? ?  ?Frequency BID ?  ? ? ?Co-evaluation   ?  ?  ?  ?  ? ? ?  ?AM-PAC PT "6 Clicks" Mobility  ?Outcome Measure Help needed turning from your back to your side while in a flat bed without using bedrails?: A Little ?Help needed moving from lying on your back to sitting on the side of a flat bed without using bedrails?: A Little ?Help needed moving to and from a bed to a chair (including a wheelchair)?: A Little ?Help needed standing up from a chair using your arms (e.g., wheelchair or bedside chair)?: A Little ?Help needed to walk  in hospital room?: A Little ?Help needed climbing 3-5 steps with a railing? : A Lot ?6 Click Score: 17 ? ?  ?End of Session Equipment Utilized During Treatment: Gait belt ?Activity Tolerance: Patient tolerated treatment well ?Patient left: in chair;with call bell/phone within reach;with chair alarm set;with SCD's reapplied ?Nurse Communication: Mobility status;Precautions ?PT Visit  Diagnosis: Unsteadiness on feet (R26.81);Difficulty in walking, not elsewhere classified (R26.2);History of falling (Z91.81);Muscle weakness (generalized) (M62.81);Pain ?Pain - part of body:  (low back) ?  ? ?Time: 1010-1034 ?PT T

## 2021-11-19 NOTE — Progress Notes (Signed)
Assessed patient's surgical sites, Left most back surgical site swollen and hard to palpation, small amount of new sanguinous drainage. Neurosurgery PA [facility] notified, continue to ice overnight and re-eval in the AM. Instructed to contact on-call provider if any new symptoms arise.  ?

## 2021-11-19 NOTE — Progress Notes (Signed)
? ?   Attending Progress Note ? ?History: Justin Nixon is s/p L4-5 XLIF ? ?Physical Exam: ?Vitals:  ? 11/19/21 0428 11/19/21 0722  ?BP: 118/85 (!) 122/97  ?Pulse: (!) 53 66  ?Resp: 17 18  ?Temp: 97.8 ?F (36.6 ?C) (!) 97.4 ?F (36.3 ?C)  ?SpO2: 99% 99%  ? ? ?AA Ox3 ?CNI ? ?Strength:5/5 throughout BLE  ?Incisions covered with clean post-op bandages ? ?Data: ? ?Recent Labs  ?Lab 11/18/21 ?1656  ?CREATININE 1.00  ? ?No results for input(s): AST, ALT, ALKPHOS in the last 168 hours. ? ?Invalid input(s): TBILI  ? Recent Labs  ?Lab 11/18/21 ?1656  ?WBC 9.8  ?HGB 13.1  ?HCT 39.1  ?PLT 265  ? ?No results for input(s): APTT, INR in the last 168 hours.  ?   ? ? ?Other tests/results: none  ? ?Assessment/Plan: ? ?Justin Nixon 46 year old with lumbar radiculopathy and anterolisthesis status post L4-5 XLIF on 11/18/2021. ? ?- mobilize ?- pain control ?- DVT prophylaxis ?- PTOT; established with [facility] pre-op for [facility] ? ?Manning Charity PA-C ?Department of Neurosurgery ? ?  ?

## 2021-11-19 NOTE — Anesthesia Postprocedure Evaluation (Signed)
Anesthesia Post Note ? ?Patient: Justin Nixon ? ?Procedure(s) Performed: L4-5 LATERAL LUMBAR INTERBODY FUSION WITH POSTERIOR SPINAL FUSION (Spine Lumbar) ?APPLICATION OF INTRAOPERATIVE CT SCAN (Spine Lumbar) ? ?Patient location during evaluation: PACU ?Anesthesia Type: General ?Level of consciousness: awake and alert ?Pain management: pain level controlled ?Vital Signs Assessment: post-procedure vital signs reviewed and stable ?Respiratory status: spontaneous breathing, nonlabored ventilation, respiratory function stable and patient connected to nasal cannula oxygen ?Cardiovascular status: blood pressure returned to baseline and stable ?Postop Assessment: no apparent nausea or vomiting ?Anesthetic complications: no ? ? ?No notable events documented. ? ? ?Last Vitals:  ?Vitals:  ? 11/19/21 0428 11/19/21 0722  ?BP: 118/85 (!) 122/97  ?Pulse: (!) 53 66  ?Resp: 17 18  ?Temp: 36.6 ?C (!) 36.3 ?C  ?SpO2: 99% 99%  ?  ?Last Pain:  ?Vitals:  ? 11/19/21 0733  ?TempSrc:   ?PainSc: 9   ? ? ?  ?  ?  ?  ?  ?  ? ?Yevette Edwards ? ? ? ? ?

## 2021-11-19 NOTE — Progress Notes (Signed)
Physical Therapy Treatment ?Patient Details ?Name: Justin Nixon ?MRN: [id number] ?DOB: 12/29/1975 ?Today's Date: 11/19/2021 ? ? ?History of Present Illness Pt is a 46 yo male diagnosed with lumbar radiculopathy and anterolisthesis and now s/p L4-5 XLIF/PSF on 11/18/21. PMHx includes schizophrenia, anxiety, GERD, HLD, and L elbow fx s/p ORIF 08/28/21. ? ?  ?PT Comments  ? ? Pt was pleasant and motivated to participate during the session and put forth good effort throughout. Pt was able to ambulate with some minor trembling of the stance leg but without overt buckling this session.  Pt was also able to participate in stair training this session per below and although pt did present with some minor buckling he was able to self-correct without physical assistance.  Pt reported no adverse symptoms other than back pain during the session that did not worsen with activity.  Pt will benefit from HHPT upon discharge to safely address deficits listed in patient problem list for decreased caregiver assistance and eventual return to PLOF. ? ?   ?Recommendations for follow up therapy are one component of a multi-disciplinary discharge planning process, led by the attending physician.  Recommendations may be updated based on patient status, additional functional criteria and insurance authorization. ? ?Follow Up Recommendations ? Home health PT ?  ?  ?Assistance Recommended at Discharge Frequent or constant Supervision/Assistance  ?Patient can return home with the following A little help with walking and/or transfers;A little help with bathing/dressing/bathroom;Assistance with cooking/housework;Direct supervision/assist for medications management;Assist for transportation;Help with stairs or ramp for entrance ?  ?Equipment Recommendations ? Rolling walker (2 wheels)  ?  ?Recommendations for Other Services   ? ? ?  ?Precautions / Restrictions Precautions ?Precautions: Back;Fall ?Precaution Booklet Issued: Yes  (comment) ?Restrictions ?Weight Bearing Restrictions: No ?Other Position/Activity Restrictions: watch for buckling; no brace needed  ?  ? ?Mobility ? Bed Mobility ?Overal bed mobility: Needs Assistance ?Bed Mobility: Rolling, Sidelying to Sit, Sit to Sidelying ?Rolling: Supervision ?Sidelying to sit: Min assist ?  ?  ?Sit to sidelying: Min assist ?General bed mobility comments: Log roll training/review with min A for BLE control ?  ? ?Transfers ?Overall transfer level: Needs assistance ?Equipment used: Rolling walker (2 wheels) ?Transfers: Sit to/from Stand ?Sit to Stand: Min guard ?  ?Step pivot transfers: Min guard ?  ?  ?  ?General transfer comment: Min verbal cues for sequencing for back precaution compliance ?  ? ?Ambulation/Gait ?Ambulation/Gait assistance: Min guard ?Gait Distance (Feet): 10 Feet ?Assistive device: Rolling walker (2 wheels) ?Gait Pattern/deviations: Step-to pattern, Decreased step length - left, Decreased step length - right ?Gait velocity: decreased ?  ?  ?General Gait Details: Slow cadence with step-to pattern with min verbal cues for sequencing; no overt buckling noted this session ? ? ?Stairs ?Stairs: Yes ?Stairs assistance: Min guard, +2 safety/equipment ?Stair Management: Two rails ?Number of Stairs: 2 ?General stair comments: Pt able to ascend forwards and descend backwards 1 step x 3 and 2 steps x 1 with mod verbal cues for sequencing; occasional minimal knee buckling that the pt was able to self-correct without assist ? ? ?Wheelchair Mobility ?  ? ?Modified Rankin (Stroke Patients Only) ?  ? ? ?  ?Balance Overall balance assessment: Needs assistance ?Sitting-balance support: No upper extremity supported, Feet supported ?Sitting balance-Leahy Scale: Normal ?  ?  ?Standing balance support: Bilateral upper extremity supported, Reliant on assistive device for balance, During functional activity ?Standing balance-Leahy Scale: Fair ?Standing balance comment: BLE buckling requiring  occasional min A  to correct with heavy reliance on RW ?  ?  ?  ?  ?  ?  ?  ?  ?  ?  ?  ?  ? ?  ?Cognition Arousal/Alertness: Awake/alert ?Behavior During Therapy: [facility] for tasks assessed/performed ?Overall Cognitive Status: Within Functional Limits for tasks assessed ?  ?  ?  ?  ?  ?  ?  ?  ?  ?  ?  ?  ?  ?  ?  ?  ?  ?  ?  ? ?  ?Exercises Other Exercises ?Other Exercises: Log roll training ?Other Exercises: Back precaution review ? ?  ?General Comments   ?  ?  ? ?Pertinent Vitals/Pain Pain Assessment ?Pain Assessment: 0-10 ?Pain Score: 7  ?Pain Location: lumbar back ?Pain Descriptors / Indicators: Aching ?Pain Intervention(s): Premedicated before session, Repositioned, Monitored during session  ? ? ?Home Living   ?  ?  ?  ?  ?  ?  ?  ?  ?  ?   ?  ?Prior Function    ?  ?  ?   ? ?PT Goals (current goals can now be found in the care plan section) Progress towards PT goals: Progressing toward goals ? ?  ?Frequency ? ? ? BID ? ? ? ?  ?PT Plan Current plan remains appropriate  ? ? ?Co-evaluation   ?  ?  ?  ?  ? ?  ?AM-PAC PT "6 Clicks" Mobility   ?Outcome Measure ? Help needed turning from your back to your side while in a flat bed without using bedrails?: A Little ?Help needed moving from lying on your back to sitting on the side of a flat bed without using bedrails?: A Little ?Help needed moving to and from a bed to a chair (including a wheelchair)?: A Little ?Help needed standing up from a chair using your arms (e.g., wheelchair or bedside chair)?: A Little ?Help needed to walk in hospital room?: A Little ?Help needed climbing 3-5 steps with a railing? : A Lot ?6 Click Score: 17 ? ?  ?End of Session Equipment Utilized During Treatment: Gait belt ?Activity Tolerance: Patient tolerated treatment well ?Patient left: in bed;with call bell/phone within reach;with bed alarm set;with SCD's reapplied ?Nurse Communication: Mobility status;Precautions ?PT Visit Diagnosis: Unsteadiness on feet (R26.81);Difficulty in walking, not  elsewhere classified (R26.2);History of falling (Z91.81);Muscle weakness (generalized) (M62.81);Pain ?Pain - part of body:  (low back) ?  ? ? ?Time: [id number] ?PT Time Calculation (min) (ACUTE ONLY): 24 min ? ?Charges:  $Gait Training: 8-22 mins ?$Therapeutic Activity: 8-22 mins          ?          ? ?D. Royetta Asal PT, DPT ?11/19/21, 4:33 PM ? ?

## 2021-11-19 NOTE — Plan of Care (Signed)

## 2021-11-19 NOTE — Discharge Summary (Signed)
Physician Discharge Summary  ?Patient ID: ?Justin Nixon ?MRN: [id number] ?DOB/AGE: [birth year] 46 y.o. ? ?Admit date: 11/18/2021 ?Discharge date: 11/20/2021 ? ?Admission Diagnoses:  ?Anterolisthesis M43.10, Lumbar radiculopathy M54.16 ? ?Discharge Diagnoses:  ?Principal Problem: ?  S/P spinal fusion ? ? ?Discharged Condition: good ? ?Hospital Course:  ?Justin Nixon is a 46 y.o s/p L4-5 XLIF. His interoperative course was uncomplicated. He was admitted for therapy evaluation and pain control. He reported improvement of his back and leg pain post-op. He was evaluated by PT and deemed appropriate for discharge home with home health on POD2. He was given prescriptions for Oxycodone, Robaxin, and Senna ? ?Consults: None ? ?Significant Diagnostic Studies: none ? ?Treatments: surgery: as above. Please see separately dictated operative report for further details ? ?Discharge Exam: ?Blood pressure 125/78, pulse 90, temperature 98.4 ?F (36.9 ?C), temperature source Oral, resp. rate 18, height 6' (1.829 m), weight 69.9 kg, SpO2 100 %. ?A&O x 3 ?CN grossly intact ?5/5 throughout BLE  ? ?Disposition: Discharge disposition: 06-Home-Health Care Svc ? ? ? ? ? ? ?Discharge Instructions   ? ? Incentive spirometry RT   Complete by: As directed ?  ? Increase activity slowly   Complete by: As directed ?  ? Remove dressing in 24 hours   Complete by: As directed ?  ? ?  ? ?Allergies as of 11/20/2021   ? ?   Reactions  ? Penicillins Itching  ? TOLERATED CEFAZOLIN 10/10/2020 ?Has patient had a PCN reaction causing immediate rash, facial/tongue/throat swelling, SOB or lightheadedness with hypotension: No ?Has patient had a PCN reaction causing severe rash involving mucus membranes or skin necrosis: No ?Has patient had a PCN reaction that required hospitalization No ?Has patient had a PCN reaction occurring within the last 10 years: Yes ?If all of the above answers are "NO", then may proceed with Cephalosporin use.  ? ?  ? ?  ?Medication List  ?   ? ?STOP taking these medications   ? ?aspirin EC 325 MG tablet ?  ?cyclobenzaprine 5 MG tablet ?Commonly known as: FLEXERIL ?  ?etodolac 500 MG tablet ?Commonly known as: LODINE ?  ?sulfamethoxazole-trimethoprim 800-160 MG tablet ?Commonly known as: BACTRIM DS ?  ? ?  ? ?TAKE these medications   ? ?gabapentin 300 MG capsule ?Commonly known as: Neurontin ?Take 1 capsule (300 mg total) by mouth 3 (three) times daily. Start with 1 PO QHS x 3 days, then 1 BID x 3 days, then 1 TID ?  ?lurasidone 40 MG Tabs tablet ?Commonly known as: LATUDA ?Take 40 mg by mouth daily after supper. ?  ?methocarbamol 500 MG tablet ?Commonly known as: ROBAXIN ?Take 1 tablet (500 mg total) by mouth every 6 (six) hours as needed for muscle spasms. ?  ?oxyCODONE 5 MG immediate release tablet ?Commonly known as: Oxy IR/ROXICODONE ?Take 1 tablet (5 mg total) by mouth every 3 (three) hours as needed for up to 5 days for moderate pain ((score 4 to 6)). ?What changed:  ?when to take this ?reasons to take this ?  ?QUEtiapine 100 MG tablet ?Commonly known as: SEROQUEL ?Take 50-100 mg by mouth at bedtime. ?  ?senna 8.6 MG Tabs tablet ?Commonly known as: SENOKOT ?Take 1 tablet (8.6 mg total) by mouth daily as needed for mild constipation. ?  ? ?  ? ?  ?  ? ? ?  ?Durable Medical Equipment  ?(From admission, onward)  ?  ? ? ?  ? ?  Start  Ordered  ? 11/19/21 1117  For home use only DME Walker rolling  Once       ?Question Answer Comment  ?Walker: With 5 Inch Wheels   ?Patient needs a walker to treat with the following condition Difficulty walking   ?  ? 11/19/21 1117  ? ?  ?  ? ?  ? ? Follow-up Information   ? ? Susanne Borders, PA Follow up on 12/03/2021.   ?Why: f/u for incision check and post-op. This appointment date and time is included in your pre-op paperwork ?Contact information: ?[address] ?[place] [address] ?[phone] ? ? ?  ?  ? ?  ?  ? ?  ? ? ?Signed: ?Susanne Borders ?11/20/2021, 1:04 PM ? ? ?

## 2021-11-19 NOTE — Evaluation (Signed)
Occupational Therapy Evaluation ?Patient Details ?Name: Justin Nixon ?MRN: [id number] ?DOB: 07-Jun-1976 ?Today's Date: 11/19/2021 ? ? ?History of Present Illness 46yo male s/p L4-5 XLIF/PSF on 11/18/21. PMHx includes schizophrenia, anxiety, GERD, HLD, and L elbow fx s/p ORIF 08/28/21.  ? ?Clinical Impression ?  ?Pt seen for OT evaluation this date, POD#1 from lumbar surgery above. Prior to hospital admission, pt was mod independent with mobility using a QC, mod indep with basic ADL (sponge bath), and endorses extensive falls history (~15 falls in past 30mo) due to BLE buckling. Pt lives with spouse in a mobile home with 6 steps to enter and bilateral handrails with spouse able to provide PRN assist/support as she works. Currently pt requires CGA for log roll bed mobility, CGA for ADL transfers from elevated bed with VC for RW mgt/hand placement, and MIN A to correct for BLE buckling with a couple steps. Pt requires MIN A for LB ADL tasks in order to maintain precautions. Pt educated in back precautions with handout provided, self care skills, bed mobility and functional transfer training, AE/DME for bathing, dressing, and toileting needs, and home/routines modifications and falls prevention strategies to maximize safety and functional independence while minimizing falls risk and maintaining precautions. Handout provided to support recall and carry over of learned precautions/techniques for bed mobility, functional transfers, and self care skills. Pt will benefit from additional skilled OT before discharge home to maximize safety/indep and minimize falls and caregiver burden. Recommend HHOT services upon discharge, *pending additional progression during OT sessions.   ? ?Recommendations for follow up therapy are one component of a multi-disciplinary discharge planning process, led by the attending physician.  Recommendations may be updated based on patient status, additional functional criteria and insurance authorization.   ? ?Follow Up Recommendations ? Home health OT  ?  ?Assistance Recommended at Discharge Intermittent Supervision/Assistance  ?Patient can return home with the following A little help with walking and/or transfers;A little help with bathing/dressing/bathroom;Assistance with cooking/housework;Assist for transportation;Help with stairs or ramp for entrance ? ?  ?Functional Status Assessment ? Patient has had a recent decline in their functional status and demonstrates the ability to make significant improvements in function in a reasonable and predictable amount of time.  ?Equipment Recommendations ? BSC/3in1;Other (comment) (2WW; pt going to purchase [facility] on his own)  ?  ?Recommendations for Other Services   ? ? ?  ?Precautions / Restrictions Precautions ?Precautions: Back;Fall ?Precaution Booklet Issued: Yes (comment) ?Restrictions ?Weight Bearing Restrictions: No ?Other Position/Activity Restrictions: watch for buckling  ? ?  ? ?Mobility Bed Mobility ?Overal bed mobility: Needs Assistance ?Bed Mobility: Rolling, Sidelying to Sit ?Rolling: Supervision ?Sidelying to sit: Min guard ?  ?  ?  ?General bed mobility comments: VC for log roll with good carryover ?  ? ?Transfers ?Overall transfer level: Needs assistance ?Equipment used: Rolling walker (2 wheels) ?Transfers: Sit to/from Stand, Bed to chair/wheelchair/BSC ?Sit to Stand: From elevated surface, Min guard ?  ?  ?Step pivot transfers: Min guard, Min assist ?  ?  ?General transfer comment: MIN A to correct for 1 instance of BLE buckling and heavy reliance on RW ?  ? ?  ?Balance Overall balance assessment: Needs assistance ?Sitting-balance support: No upper extremity supported, Feet supported ?Sitting balance-Leahy Scale: Good ?  ?  ?Standing balance support: Bilateral upper extremity supported, Reliant on assistive device for balance ?Standing balance-Leahy Scale: Poor ?Standing balance comment: BLE buckling requiring CGA-MIN A To correct with heavy reliance on RW ?   ?  ?  ?  ?  ?  ?  ?  ?  ?  ?  ?   ? ?  ADL either performed or assessed with clinical judgement  ? ?ADL Overall ADL's : Needs assistance/impaired ?  ?  ?  ?  ?  ?  ?  ?  ?  ?  ?  ?  ?  ?  ?  ?  ?  ?  ?  ?General ADL Comments: Pt currently requires MIN A for LB ADL tasks in order to maintain back precautions, CGA for ADL transfers  ? ? ? ?Vision   ?   ?   ?Perception   ?  ?Praxis   ?  ? ?Pertinent Vitals/Pain Pain Assessment ?Pain Assessment: 0-10 ?Pain Score: 7  ?Pain Location: lumbar back ?Pain Descriptors / Indicators: Aching ?Pain Intervention(s): Limited activity within patient's tolerance, Monitored during session, Premedicated before session, Repositioned  ? ? ? ?Hand Dominance   ?  ?Extremity/Trunk Assessment Upper Extremity Assessment ?Upper Extremity Assessment: Overall WFL for tasks assessed ?  ?Lower Extremity Assessment ?Lower Extremity Assessment: Generalized weakness (denies sensory deficits, however notes hx of some decreased sensation with prolonged standing and BLE weakness leading to buckling) ?  ?Cervical / Trunk Assessment ?Cervical / Trunk Assessment: Back Surgery ?  ?Communication Communication ?Communication: No difficulties ?  ?Cognition Arousal/Alertness: Awake/alert ?Behavior During Therapy: [facility] for tasks assessed/performed ?Overall Cognitive Status: Within Functional Limits for tasks assessed ?  ?  ?  ?  ?  ?  ?  ?  ?  ?  ?  ?  ?  ?  ?  ?  ?  ?  ?  ?General Comments    ? ?  ?Exercises Other Exercises ?Other Exercises: Pt instructed in back precautions and how to maintain during ADL/IADL, ADL transfers, and bed mobility, AE/DME, home/routines modificaitons, and falls prevention strategies; handout provided to support recall and carryover ?  ?Shoulder Instructions    ? ? ?Home Living Family/patient expects to be discharged to:: Private residence ?Living Arrangements: Spouse/significant other ?Available Help at Discharge: Family;Available PRN/intermittently ?Type of Home: Mobile home ?Home  Access: Stairs to enter ?Entrance Stairs-Number of Steps: 6 ?Entrance Stairs-Rails: Right;Left;Can reach both ?Home Layout: One level ?  ?  ?Bathroom Shower/Tub: Tub/shower unit ?  ?Bathroom Toilet: Standard ?  ?  ?Home Equipment: Shower seat;Cane - quad;Hand held shower head ?  ?Additional Comments: might be able to borrow his mother's 2WW (~65yr old, unknown condition) ?  ? ?  ?Prior Functioning/Environment Prior Level of Function : Independent/Modified Independent;History of Falls (last six months) ?  ?  ?  ?  ?  ?  ?Mobility Comments: using QC in recently months 2/2 buckling and extensive falls history (~15 falls/29mo 2/2 BLE buckling) ?ADLs Comments: sponge baths, limited in standing tolerance for ADL to 5-39min, hasn't driven due to BLE impairments ?  ? ?  ?  ?OT Problem List: Decreased strength;Pain;Decreased activity tolerance;Decreased knowledge of use of DME or AE;Impaired balance (sitting and/or standing);Decreased knowledge of precautions ?  ?   ?OT Treatment/Interventions: Self-care/ADL training;Therapeutic exercise;Therapeutic activities;DME and/or AE instruction;Patient/family education;Balance training  ?  ?OT Goals(Current goals can be found in the care plan section) Acute Rehab OT Goals ?Patient Stated Goal: get stronger and be more independent ?OT Goal Formulation: With patient ?Time For Goal Achievement: 12/03/21 ?Potential to Achieve Goals: Good ?ADL Goals ?Pt Will Perform Lower Body Dressing: with modified independence;with adaptive equipment;sit to/from stand (maintaining precautions) ?Pt Will Transfer to Toilet: with modified independence;ambulating (BSC over toilet, LRAD, maintaining back precautions) ?Pt Will Perform Toileting - Clothing Manipulation and hygiene: with modified independence ?  Pt Will Perform Tub/Shower Transfer: with supervision;ambulating ?Additional ADL Goal #1: Pt will verbalize 100% of precautions and how to maintain during LB ADL and ADL transfers to maximize safety  during recovery. ?Additional ADL Goal #2: Pt will negotiate in-room distances with RW, taking rest breaks as needed, with no buckling noted and while maintaining back precautions.  ?OT Frequency: Min 2X/wee

## 2021-11-19 NOTE — TOC Initial Note (Signed)
Transition of Care (TOC) - Initial/Assessment Note  ? ? ?Patient Details  ?Name: Justin Nixon ?MRN: [id number] ?Date of Birth: [birth year] ? ?Transition of Care (TOC) CM/SW Contact:    ?Truddie Hidden, RN ?Phone Number: ?11/19/2021, 3:48 PM ? ?Clinical Narrative:                 ?Patient will discharge home with [facility] via [facility]. Referral confirmed. Patient needs new rolling walker. Rhonda with [employer] contacted ? ?  ?  ? ? ?Patient Goals and CMS Choice ?  ?  ?  ? ?Expected Discharge Plan and Services ?  ?  ?  ?  ?  ?                ?  ?  ?  ?  ?  ?  ?  ?  ?  ?  ? ?Prior Living Arrangements/Services ?  ?  ?  ?       ?  ?  ?  ?  ? ?Activities of Daily Living ?Home Assistive Devices/Equipment: Dan Humphreys (specify type), Cane (specify quad or straight), Eyeglasses ?ADL Screening (condition at time of admission) ?Patient's cognitive ability adequate to safely complete daily activities?: No ?Is the patient deaf or have difficulty hearing?: No ?Does the patient have difficulty seeing, even when wearing glasses/contacts?: No ?Does the patient have difficulty concentrating, remembering, or making decisions?: No ?Patient able to express need for assistance with ADLs?: Yes ?Does the patient have difficulty dressing or bathing?: No ?Independently performs ADLs?: Yes (appropriate for developmental age) ?Does the patient have difficulty walking or climbing stairs?: No ?Weakness of Legs: None ?Weakness of Arms/Hands: None ? ?Permission Sought/Granted ?  ?  ?   ?   ?   ?   ? ?Emotional Assessment ?  ?  ?  ?  ?  ?  ? ?Admission diagnosis:  S/P spinal fusion [Z98.1] ?Patient Active Problem List  ? Diagnosis Date Noted  ? S/P spinal fusion 11/18/2021  ? Olecranon fracture, left, open type I or II, initial encounter 08/28/2021  ? ?PCP:  Gracelyn Nurse, MD ?Pharmacy:   ?[phone] - Closed - [place], [place] - [address] ?[address] ?[place] [address] ?Phone: [phone] Fax: [phone] ? ?[id number] [place], [place] - [address] ?[address] ?[place] [address] ?Phone: [phone] Fax: [phone] ? ? ? ? ?Social Determinants of Health (SDOH) Interventions ?  ? ?Readmission Risk Interventions ?   ? View : No data to display.  ?  ?  ?  ? ? ? ?

## 2021-11-19 NOTE — Progress Notes (Signed)
No output yet since FC Removed at 6:12am, bladder scanning done, 11ml urine noted in the bladder.  ?

## 2021-11-20 MED ORDER — SENNA 8.6 MG PO TABS: 1.0000 | ORAL_TABLET | Freq: Every day | ORAL | 0 refills | Status: DC | PRN

## 2021-11-20 MED ORDER — METHOCARBAMOL 500 MG PO TABS: 500.0000 mg | ORAL_TABLET | Freq: Four times a day (QID) | ORAL | 0 refills | Status: DC | PRN

## 2021-11-20 MED ORDER — OXYCODONE HCL 5 MG PO TABS: 5.0000 mg | ORAL_TABLET | ORAL | 0 refills | Status: AC | PRN

## 2021-11-20 NOTE — Evaluation (Signed)
Occupational Therapy Evaluation ?Patient Details ?Name: Justin Nixon ?MRN: [id number] ?DOB: 1976/01/13 ?Today's Date: 11/20/2021 ? ? ?History of Present Illness Pt is a 46 yo male diagnosed with lumbar radiculopathy and anterolisthesis and now s/p L4-5 XLIF/PSF on 11/18/21. PMHx includes schizophrenia, anxiety, GERD, HLD, and L elbow fx s/p ORIF 08/28/21.  ? ?Clinical Impression ?  ?Chart reviewed to date, pt greeted in bathroom agreeable to OT tx session. Tx session targeted progressing functional mobility and ADL tasks in order to return to PLOF and to facilitate safe ADL completion following discharge. Grooming, bathing, dressing tasks completed at sink level in standing with supervision, intermittent vcs. Functional transfers completed with supervision with RW, toileting completed with MOD I. No buckling noted on this date during amb with RW, standing ADL tasks. Education provided re; DME use, home safety, falls prevention. Pt is making progress towards goals, discharge recommendation remains appropriate. OT will continue to follow acutely.  ?   ? ?Recommendations for follow up therapy are one component of a multi-disciplinary discharge planning process, led by the attending physician.  Recommendations may be updated based on patient status, additional functional criteria and insurance authorization.  ? ?Follow Up Recommendations ? Home health OT  ?  ?Assistance Recommended at Discharge Intermittent Supervision/Assistance  ?Patient can return home with the following A little help with walking and/or transfers;A little help with bathing/dressing/bathroom;Assistance with cooking/housework;Assist for transportation;Help with stairs or ramp for entrance ? ?  ?Functional Status Assessment ? Patient has had a recent decline in their functional status and demonstrates the ability to make significant improvements in function in a reasonable and predictable amount of time.  ?Equipment Recommendations ? BSC/3in1  ?   ?Recommendations for Other Services   ? ? ?  ?Precautions / Restrictions Precautions ?Precautions: Back;Fall ?Restrictions ?Weight Bearing Restrictions: No  ? ?  ? ?Mobility Bed Mobility ?  ?  ?  ?  ?  ?  ?  ?General bed mobility comments: NT recieved in bathroom ?  ? ?Transfers ?Overall transfer level: Needs assistance ?Equipment used: Rolling walker (2 wheels) ?Transfers: Sit to/from Stand ?Sit to Stand: Supervision ?  ?  ?  ?  ?  ?  ?  ? ?  ?Balance Overall balance assessment: Needs assistance ?Sitting-balance support: No upper extremity supported, Feet supported ?Sitting balance-Leahy Scale: Normal ?  ?  ?Standing balance support: Bilateral upper extremity supported, Reliant on assistive device for balance, During functional activity ?Standing balance-Leahy Scale: Good ?Standing balance comment: no buckling noted during standign ADL tasks, ADL amb on this date ?  ?  ?  ?  ?  ?  ?  ?  ?  ?  ?  ?   ? ?ADL either performed or assessed with clinical judgement  ? ?ADL Overall ADL's : Needs assistance/impaired ?  ?  ?Grooming: Wash/dry hands;Wash/dry face;Oral care;Standing;Supervision/safety ?Grooming Details (indicate cue type and reason): sink level with RW ?Upper Body Bathing: Minimal assistance;Standing ?  ?Lower Body Bathing: Minimal assistance;Sit to/from stand ?  ?Upper Body Dressing : Set up ?  ?  ?  ?Toilet Transfer: Supervision/safety;Ambulation;Rolling walker (2 wheels) ?  ?Toileting- Clothing Manipulation and Hygiene: Modified independent ?  ?  ?  ?Functional mobility during ADLs: Supervision/safety;Rolling walker (2 wheels) ?   ? ? ? ?Vision   ?   ?   ?Perception   ?  ?Praxis   ?  ? ?Pertinent Vitals/Pain Pain Assessment ?Pain Assessment: 0-10 ?Pain Score: 7  ?Pain Location: lumbar back ?Pain Descriptors /  Indicators: Aching ?Pain Intervention(s): Limited activity within patient's tolerance, Monitored during session, Repositioned  ? ? ? ?Hand Dominance   ?  ?Extremity/Trunk Assessment   ?  ?  ?  ?  ?   ?Communication   ?  ?Cognition Arousal/Alertness: Awake/alert ?Behavior During Therapy: [facility] for tasks assessed/performed ?Overall Cognitive Status: Within Functional Limits for tasks assessed ?  ?  ?  ?  ?  ?  ?  ?  ?  ?  ?  ?  ?  ?  ?  ?  ?  ?  ?  ?General Comments    ? ?  ?Exercises Other Exercises ?Other Exercises: edu re: precatuions, safe ADL completion, fallls prevention, DME use ?  ?Shoulder Instructions    ? ? ?Home Living   ?  ?  ?  ?  ?  ?  ?  ?  ?  ?  ?  ?  ?  ?  ?  ?  ?  ?  ? ?  ?Prior Functioning/Environment   ?  ?  ?  ?  ?  ?  ?  ?  ?  ? ?  ?  ?OT Problem List: Decreased strength;Pain;Decreased activity tolerance;Decreased knowledge of use of DME or AE;Impaired balance (sitting and/or standing);Decreased knowledge of precautions ?  ?   ?OT Treatment/Interventions: Self-care/ADL training;Therapeutic exercise;Therapeutic activities;DME and/or AE instruction;Patient/family education;Balance training  ?  ?OT Goals(Current goals can be found in the care plan section) Acute Rehab OT Goals ?Patient Stated Goal: go home ?OT Goal Formulation: With patient ?Time For Goal Achievement: 12/04/21 ?Potential to Achieve Goals: Good  ?OT Frequency: Min 2X/week ?  ? ?Co-evaluation   ?  ?  ?  ?  ? ?  ?AM-PAC OT "6 Clicks" Daily Activity     ?Outcome Measure Help from another person eating meals?: None ?Help from another person taking care of personal grooming?: None ?Help from another person toileting, which includes using toliet, bedpan, or urinal?: None ?Help from another person bathing (including washing, rinsing, drying)?: A Little ?Help from another person to put on and taking off regular upper body clothing?: None ?Help from another person to put on and taking off regular lower body clothing?: A Little ?6 Click Score: 22 ?  ?End of Session Equipment Utilized During Treatment: Gait belt;Rolling walker (2 wheels) ?Nurse Communication: Mobility status ? ?Activity Tolerance: Patient tolerated treatment well ?Patient  left: in chair;with call bell/phone within reach;with chair alarm set ? ?OT Visit Diagnosis: Other abnormalities of gait and mobility (R26.89);Repeated falls (R29.6);Muscle weakness (generalized) (M62.81);Pain  ?              ?Time: [id number] ?OT Time Calculation (min): 24 min ?Charges:  OT General Charges ?$OT Visit: 1 Visit ?OT Treatments ?$Self Care/Home Management : 23-37 mins ?Oleta Mouse, OTD OTR/L  ?11/20/21, 9:22 AM  ?

## 2021-11-20 NOTE — Progress Notes (Signed)
1335 ?Janetta Hora PA informed nurse that pt is ok to d/c if swelling to L incision site was ok. Nurse will reassess and if any further concerns Danielle PA will stop by pt room after surgery case in couple of hours. ? ?1340 ?Swelling to L lower back incision minimal and soft to touch. Pt states he feel ok to d/c home  ? ?1349 ?Dc avs reviewed with pt. All questions and concerns addressed. IV's removed. Pt getting dressed awaiting on ride ?

## 2021-11-20 NOTE — Progress Notes (Signed)
Physical Therapy Treatment ?Patient Details ?Name: Justin Nixon ?MRN: [id number] ?DOB: 1975-12-21 ?Today's Date: 11/20/2021 ? ? ?History of Present Illness Pt is a 46 yo male diagnosed with lumbar radiculopathy and anterolisthesis and now s/p L4-5 XLIF/PSF on 11/18/21. PMHx includes schizophrenia, anxiety, GERD, HLD, and L elbow fx s/p ORIF 08/28/21. ? ?  ?PT Comments  ? ? Pt was pleasant and motivated to participate during the session and put forth good effort throughout. Pt made good progress towards goals this session.  Pt was able to ambulate further and ascend/descend more stairs both with improved stability and with no LE buckling.  Pt subjectively reported feeling that his legs were notably stronger this session compared to yesterday.  Pt reported no adverse symptoms during the session other than moderate back pain that did not worsen with activity.  Pt will benefit from HHPT upon discharge to safely address deficits listed in patient problem list for decreased caregiver assistance and eventual return to PLOF. ?   ?Recommendations for follow up therapy are one component of a multi-disciplinary discharge planning process, led by the attending physician.  Recommendations may be updated based on patient status, additional functional criteria and insurance authorization. ? ?Follow Up Recommendations ? Home health PT ?  ?  ?Assistance Recommended at Discharge Frequent or constant Supervision/Assistance  ?Patient can return home with the following A little help with walking and/or transfers;A little help with bathing/dressing/bathroom;Assistance with cooking/housework;Direct supervision/assist for medications management;Assist for transportation;Help with stairs or ramp for entrance ?  ?Equipment Recommendations ? Rolling walker (2 wheels)  ?  ?Recommendations for Other Services   ? ? ?  ?Precautions / Restrictions Precautions ?Precautions: Back;Fall ?Restrictions ?Weight Bearing Restrictions: No ?Other  Position/Activity Restrictions: watch for buckling; no brace needed  ?  ? ?Mobility ? Bed Mobility ?  ?  ?  ?  ?  ?  ?  ?General bed mobility comments: NT, pt in recliner ?  ? ?Transfers ?Overall transfer level: Needs assistance ?Equipment used: Rolling walker (2 wheels) ?Transfers: Sit to/from Stand ?Sit to Stand: Supervision ?  ?  ?  ?  ?  ?General transfer comment: Min verbal cues for sequencing for back precaution compliance ?  ? ?Ambulation/Gait ?Ambulation/Gait assistance: Min guard ?Gait Distance (Feet): 25 Feet ?Assistive device: Rolling walker (2 wheels) ?Gait Pattern/deviations: Step-to pattern, Decreased step length - left, Decreased step length - right ?Gait velocity: decreased ?  ?  ?General Gait Details: Slow cadence with step-to pattern with min verbal cues for sequencing with good control and stability throughout ? ? ?Stairs ?Stairs: Yes ?Stairs assistance: Min guard ?Stair Management: Two rails ?Number of Stairs: 4 ?General stair comments: Pt able to ascend forwards and descend backwards 2 steps x 1 and then 4 steps x 1 with min verbal cues for sequencing with good control and stability throughout ? ? ?Wheelchair Mobility ?  ? ?Modified Rankin (Stroke Patients Only) ?  ? ? ?  ?Balance Overall balance assessment: Needs assistance ?Sitting-balance support: No upper extremity supported, Feet supported ?Sitting balance-Leahy Scale: Normal ?  ?  ?Standing balance support: Bilateral upper extremity supported, During functional activity ?Standing balance-Leahy Scale: Good ?  ?  ?  ?  ?  ?  ?  ?  ?  ?  ?  ?  ?  ? ?  ?Cognition Arousal/Alertness: Awake/alert ?Behavior During Therapy: [facility] for tasks assessed/performed ?Overall Cognitive Status: Within Functional Limits for tasks assessed ?  ?  ?  ?  ?  ?  ?  ?  ?  ?  ?  ?  ?  ?  ?  ?  ?  ?  ?  ? ?  ?  Exercises Total Joint Exercises ?Ankle Circles/Pumps: Strengthening, Both, 10 reps (with manual resistance) ?Quad Sets: Strengthening, Both, 10 reps ?Gluteal Sets:  Strengthening, Both, 10 reps ?Long Arc Quad: Strengthening, Both, 10 reps (with gentle manual resistance) ?Knee Flexion: Strengthening, Both, 10 reps (with gentle manual resistance) ?Other Exercises ?Other Exercises: HEP education for BLE APs, QS, and GS ? ?  ?General Comments   ?  ?  ? ?Pertinent Vitals/Pain Pain Assessment ?Pain Assessment: 0-10 ?Pain Score: 7  ?Pain Location: lumbar back ?Pain Descriptors / Indicators: Aching, Sore ?Pain Intervention(s): Repositioned, Premedicated before session, Monitored during session  ? ? ?Home Living   ?  ?  ?  ?  ?  ?  ?  ?  ?  ?   ?  ?Prior Function    ?  ?  ?   ? ?PT Goals (current goals can now be found in the care plan section) Progress towards PT goals: Progressing toward goals ? ?  ?Frequency ? ? ? BID ? ? ? ?  ?PT Plan Current plan remains appropriate  ? ? ?Co-evaluation   ?  ?  ?  ?  ? ?  ?AM-PAC PT "6 Clicks" Mobility   ?Outcome Measure ? Help needed turning from your back to your side while in a flat bed without using bedrails?: A Little ?Help needed moving from lying on your back to sitting on the side of a flat bed without using bedrails?: A Little ?Help needed moving to and from a bed to a chair (including a wheelchair)?: A Little ?Help needed standing up from a chair using your arms (e.g., wheelchair or bedside chair)?: A Little ?Help needed to walk in hospital room?: A Little ?Help needed climbing 3-5 steps with a railing? : A Little ?6 Click Score: 18 ? ?  ?End of Session Equipment Utilized During Treatment: Gait belt ?Activity Tolerance: Patient tolerated treatment well ?Patient left: in chair;with call bell/phone within reach;with chair alarm set;with SCD's reapplied ?Nurse Communication: Mobility status;Precautions ?PT Visit Diagnosis: Unsteadiness on feet (R26.81);Difficulty in walking, not elsewhere classified (R26.2);History of falling (Z91.81);Muscle weakness (generalized) (M62.81);Pain ?Pain - part of body:  (low back) ?  ? ? ?Time: 3235-5732 ?PT  Time Calculation (min) (ACUTE ONLY): 26 min ? ?Charges:  $Gait Training: 8-22 mins ?$Therapeutic Exercise: 8-22 mins          ?          ? ?D. Elly Modena PT, DPT ?11/20/21, 10:55 AM ? ? ?

## 2021-11-20 NOTE — Progress Notes (Signed)
? ?   Attending Progress Note ? ?History: Justin Nixon is s/p L4-5 XLIF ? ?POD#2: constipation and incision pain overnight ? ?Physical Exam: ?Vitals:  ? 11/20/21 0325 11/20/21 0729  ?BP: 112/74 (!) 123/96  ?Pulse: 82 80  ?Resp: 20 18  ?Temp: 98.1 ?F (36.7 ?C) (!) 97.4 ?F (36.3 ?C)  ?SpO2: 99% 98%  ? ? ?AA Ox3 ?CNI ? ?Strength:5/5 throughout BLE  ?Incisions covered with clean bandages ? ?Data: ? ?Recent Labs  ?Lab 11/18/21 ?1656  ?CREATININE 1.00  ? ? ?No results for input(s): AST, ALT, ALKPHOS in the last 168 hours. ? ?Invalid input(s): TBILI  ? Recent Labs  ?Lab 11/18/21 ?1656  ?WBC 9.8  ?HGB 13.1  ?HCT 39.1  ?PLT 265  ? ? ?No results for input(s): APTT, INR in the last 168 hours.  ?   ? ? ?Other tests/results: none  ? ?Assessment/Plan: ? ?Justin Nixon 46 year old with lumbar radiculopathy and anterolisthesis status post L4-5 XLIF on 11/18/2021. ? ?- mobilize ?- pain control ?- DVT prophylaxis ?- PTOT; established with [facility] pre-op for [facility] ?- dispo planning underway ? ?Cooper Render PA-C ?Department of Neurosurgery ? ?  ?

## 2022-01-07 ENCOUNTER — Encounter: Payer: Self-pay | Admitting: Physical Therapy

## 2022-01-07 ENCOUNTER — Ambulatory Visit: Payer: Medicare Other | Attending: Neurosurgery | Admitting: Physical Therapy

## 2022-01-07 DIAGNOSIS — M6281 Muscle weakness (generalized): Secondary | ICD-10-CM | POA: Insufficient documentation

## 2022-01-07 DIAGNOSIS — M545 Low back pain, unspecified: Secondary | ICD-10-CM | POA: Diagnosis present

## 2022-01-07 DIAGNOSIS — R269 Unspecified abnormalities of gait and mobility: Secondary | ICD-10-CM | POA: Insufficient documentation

## 2022-01-07 DIAGNOSIS — R2681 Unsteadiness on feet: Secondary | ICD-10-CM | POA: Diagnosis present

## 2022-01-07 DIAGNOSIS — Z981 Arthrodesis status: Secondary | ICD-10-CM | POA: Diagnosis present

## 2022-01-07 NOTE — Therapy (Signed)
OUTPATIENT PHYSICAL THERAPY THORACOLUMBAR EVALUATION   Patient Name: Justin Nixon L Arrants MRN: [id number] DOB:09/13/1975, 46 y.o., male Today's Date: 01/07/2022   PT End of Session - 01/07/22 1238     Visit Number 1    Number of Visits 17    Date for PT Re-Evaluation 03/04/22    Progress Note Due on Visit 10    PT Start Time 0815    PT Stop Time 0900    PT Time Calculation (min) 45 min    Equipment Utilized During Treatment Gait belt    Activity Tolerance Patient limited by fatigue;Patient limited by pain    Behavior During Therapy WFL for tasks assessed/performed             Past Medical History:  Diagnosis Date   Anxiety    Arthritis    GERD (gastroesophageal reflux disease)    H/O   Hyperlipidemia    Schizophrenia (HCC)    Past Surgical History:  Procedure Laterality Date   ANTERIOR LATERAL LUMBAR FUSION WITH PERCUTANEOUS SCREW 1 LEVEL N/A 11/18/2021   Procedure: L4-5 LATERAL LUMBAR INTERBODY FUSION WITH POSTERIOR SPINAL FUSION;  Surgeon: Venetia NightYarbrough, Chester, MD;  Location: [facility];  Service: Neurosurgery;  Laterality: N/A;   APPLICATION OF INTRAOPERATIVE CT SCAN N/A 11/18/2021   Procedure: APPLICATION OF INTRAOPERATIVE CT SCAN;  Surgeon: Venetia NightYarbrough, Chester, MD;  Location: [facility];  Service: Neurosurgery;  Laterality: N/A;   COLONOSCOPY     CYST EXCISION     ELBOW ARTHROSCOPY Left 10/10/2020   Procedure: LEFT ELBOW ARTHROSCOPY WITH DEBRIDEMENT AND EXCISION OF LOOSE BODIES;  Surgeon: Christena FlakePoggi, John J, MD;  Location: [facility];  Service: Orthopedics;  Laterality: Left;   MYRINGOTOMY WITH TUBE PLACEMENT     ORIF ELBOW FRACTURE Left 08/28/2021   Procedure: OPEN REDUCTION INTERNAL FIXATION (ORIF) ELBOW/OLECRANON FRACTURE;  Surgeon: Christena FlakePoggi, John J, MD;  Location: [facility];  Service: Orthopedics;  Laterality: Left;   SHOULDER ARTHROSCOPY Left 03/18/2017   Procedure: ARTHROSCOPY SHOULDER with debridement decompression and distal clavicle excision;  Surgeon: Christena FlakePoggi, John J, MD;  [facility];  Service: Orthopedics;  Laterality: Left;   SHOULDER ARTHROSCOPY WITH OPEN ROTATOR CUFF REPAIR Right 09/23/2016   Procedure: SHOULDER ARTHROSCOPY WITH OPEN ROTATOR CUFF REPAIR;  Surgeon: Christena FlakeJohn J Poggi, MD;  Location: [facility];  Service: Orthopedics;  Laterality: Right;   SHOULDER ARTHROSCOPY WITH SUBACROMIAL DECOMPRESSION Right 09/23/2016   Procedure: SHOULDER ARTHROSCOPY WITH SUBACROMIAL DECOMPRESSION AND DEBRIDEMENT;  Surgeon: Christena FlakeJohn J Poggi, MD;  Location: [facility];  Service: Orthopedics;  Laterality: Right;   Patient Active Problem List   Diagnosis Date Noted   S/P spinal fusion 11/18/2021   Olecranon fracture, left, open type I or II, initial encounter 08/28/2021    PCP: Gracelyn NurseJohnston, John D, MD  REFERRING PROVIDER: Venetia NightYarbrough, Chester, MD  REFERRING DIAG: Z98.1 (ICD-10-CM) - History of lumbar fusion  Rationale for Evaluation and Treatment Rehabilitation  THERAPY DIAG:  Muscle weakness (generalized)  S/P lumbar fusion  Abnormality of gait and mobility  Unsteadiness on feet  Lumbar pain  ONSET DATE: 11/18/21 (DOS)  SUBJECTIVE:  SUBJECTIVE STATEMENT: Pt presents to physical therapy for evaluation of low back and LE pain and weakness L>R, following L4-L5 XLIF/PSF on 11/18/21.   PERTINENT HISTORY:  Pt presents to physical therapy for evaluation of low back and LE pain and weakness L>R, following L4-L5 XLIF/PSF on 11/18/21. Pt states he was using a cane before surgery due to onset of LLE weakness. States he has now been using a RW since surgery. Pain ranges from a 5/10 to a 10/10. When aggravated pain is described as sharp and shooting into LLE; at baseline, pain is achy. Wearing clothes such as jeans is painful due to sensitivity of skin; at home wears pajamas. Pt has not returned to  driving; he is limited to household distance ambulation or maximum into a doctors office. He feels as though his LLE is going to "give out" when he is walking or weightbearing. 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 Reports frequent falling since surgery due to LLE giving out; no injuries associated with falls.   PAIN:  Are you having pain? Yes: NPRS scale: 7/10 Pain location: primarily left low back and LLE Pain description: aching, sharp, shooting  Aggravating factors: sitting, walking (all distances), riding in a vehicle    Relieving factors: Gabapentin, celebrex   PRECAUTIONS: Back - no bending, limited twisting, no lifting > 25#  WEIGHT BEARING RESTRICTIONS No  FALLS:  Has patient fallen in last 6 months? Yes. Number of falls 4 falls since surgery in the bathroom and living room; related to LLE giving out. Additional LOB but pt able to catch himself on wall or furniture.    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 No evidence of immediate complication"  PATIENT SURVEYS:  Modified Oswestry *needs to complete back side of paper*  FOTO 23/41  SCREENING FOR RED FLAGS: Bowel or bladder incontinence: No Spinal tumors: No Cauda equina syndrome: No Compression fracture: No Abdominal aneurysm: No  COGNITION:  Overall cognitive status: Within  functional limits for tasks assessed     SENSATION: Light touch: Impaired  - increased sensitivity in left L2 and L3 dermatomes   MUSCLE LENGTH: Hamstrings: unable to test due to increase in sharp/shooting pain in hip flexors prior to HS stretch being felt  Thomas test: NT  POSTURE: decreased lumbar lordosis and posterior pelvic tilt  PALPATION: Right: hip flexors and lateral hip, glute med, piriformis, deep external rotators, QL Left: hip flexors, lateral hip and adductors (from proximal hip to mid-thigh), proximal hamstrings, L4-distal paraspinals, QL, all gluteals   LUMBAR ROM: not assessed due to back precautions   Active  A/PROM  eval  Flexion -  Extension -  Right lateral flexion -  Left lateral flexion -  Right rotation -  Left rotation -   (Blank rows = not tested)  LOWER EXTREMITY ROM:     Passive  Right eval Left eval  Hip flexion 100  90  Hip extension NT NT  Hip abduction 20 20  Hip adduction    Hip internal rotation    Hip external rotation    Knee flexion [facility] WFL  Knee extension [facility]  Ankle dorsiflexion [facility]  Ankle plantarflexion    Ankle inversion    Ankle eversion     (Blank rows = not tested)  LOWER EXTREMITY MMT:    MMT Right eval Left eval  Hip flexion 4-* 4-*  Hip extension    Hip abduction 4+* 4-*  Hip adduction 4* 4*  Hip internal rotation 5 3*  Hip external rotation 5 4*  Knee flexion 5 4*  Knee extension 5 4+  Ankle dorsiflexion 4+ 3+  Ankle plantarflexion     (Blank rows = not tested)   FUNCTIONAL TESTS:  5 times sit to stand: 43.37 seconds using arms 6 minute walk test: next session 10 meter walk test: 24.3 seconds (comfortable) = 0.41 m/s using RW; terminated fast-pace for safety   STS mechanics: heavy use of triceps, posterior lean with frequent posterior LOB when performed w/o RW.   GAIT: Distance walked: <123ft  Assistive device utilized: Environmental consultant - 2 wheeled Level of assistance: CGA Comments: increased  reliance on UE support and decreased stance time during stance phase of LLE; bilateral trendelenburg; one LOB during ambulation while using RW due to LLE weakness with controlled descent to chair.     TODAY'S TREATMENT  Evaluation only.   PATIENT EDUCATION:  Education details: Evaluation findings, POC, encouraged use of RW for all standing activities including STS and transfers  Person educated: Patient Education method: Explanation Education comprehension: verbalized understanding   HOME EXERCISE PROGRAM: Next session   ASSESSMENT:  CLINICAL IMPRESSION: Patient is a 46 y.o. male who was seen today for physical therapy evaluation of low back and LE pain and weakness L>R, following L4-L5 XLIF/PSF on 11/18/21. Pt presents with global hip weakness, L > R; tremors associated with effortful muscle contraction such as during MMT. Pain was a significant limiting factor in assessing LE ROM and flexibility. Regarding functional activity, pt performs bed mobility well with adherence to log roll technique. STS is challenging with reliance on UE support; when performed without RW, pt demonstrates posterior lean with frequent LOB. Pt was unable clear hips from chair when attempted w/o UE support. Pt did experience one LOB with a controlled descent to chair during 10 meter walk test. Pt will benefit from skilled physical therapy to address above deficits in order to increase safety with functional activities and allow pt to return to work and home duties.    OBJECTIVE IMPAIRMENTS Abnormal gait, decreased activity tolerance, decreased balance, decreased endurance, decreased mobility, difficulty walking, decreased ROM, decreased strength, decreased safety awareness, impaired flexibility, impaired sensation, postural dysfunction, and pain.   ACTIVITY LIMITATIONS carrying, lifting, bending, sitting, standing, squatting, stairs, and transfers  PARTICIPATION LIMITATIONS: cleaning, laundry, driving, shopping,  community activity, occupation, and yard work  PERSONAL FACTORS Time since onset of injury/illness/exacerbation and 1-2 comorbidities: schizophrenia and anxiety  are also affecting patient's functional outcome.   REHAB POTENTIAL: Good  CLINICAL DECISION MAKING: Evolving/moderate complexity  EVALUATION COMPLEXITY: Moderate   GOALS: Goals reviewed with patient? Yes  SHORT TERM GOALS: Target date: 01/21/2022  Patient will be independent in home exercise program to improve strength/mobility for better functional independence with ADLs. Baseline: Goal status: INITIAL  2.  Pt will ambulate 200+ feet using RW without LOB or LLE buckling to demonstrate progression towards improved ambulatory  endurance.  Baseline: 151ft  Goal status: INITIAL  3.   Patient will deny any falls over past 4 weeks to demonstrate improved safety at home.  Baseline: 4 falls since surgery  Goal status: INITIAL   LONG TERM GOALS: Target date: 03/04/2022  Patient will increase FOTO score to equal to or greater than 41 to demonstrate statistically significant improvement in mobility and quality of life.  Baseline: 01/07/22: 23/41 Goal status: INITIAL  2.   Patient (< 65 years old) will complete five times sit to stand test in < 10 seconds indicating an increased LE strength and improved balance. Baseline: 01/07/22: 43 seconds Goal status: INITIAL  3.  Patient will increase 10 meter walk test to >1.65m/s as to improve gait speed for better community ambulation and to reduce fall risk. Baseline: 01/07/22: 0.41 m/s Goal status: INITIAL  4.  Patient will increase six minute walk test distance to >1000 for progression to community ambulator and improve gait ability. Baseline: next session Goal status: INITIAL  5.  Patient will increase LLE gross strength to 4/5 as to improve functional strength for independent gait, increased standing tolerance and increased ADL ability. Baseline: 01/07/22: ranging 3-4/5, please see  objective  Goal status: INITIAL     PLAN: PT FREQUENCY: 2x/week  PT DURATION: 8 weeks  PLANNED INTERVENTIONS: Therapeutic exercises, Therapeutic activity, Neuromuscular re-education, Balance training, Gait training, Patient/Family education, Joint mobilization, Stair training, DME instructions, Dry Needling, Cryotherapy, Moist heat, and Manual therapy.  PLAN FOR NEXT SESSION: LE desensitization, global LE strengthening, core/lumbar strengthening, gait training, modalities for pain modulation as needed      Basilia Jumbo PT, DPT

## 2022-01-09 ENCOUNTER — Encounter: Payer: Self-pay | Admitting: Physical Therapy

## 2022-01-09 ENCOUNTER — Ambulatory Visit: Payer: Medicare Other | Admitting: Physical Therapy

## 2022-01-09 DIAGNOSIS — M6281 Muscle weakness (generalized): Secondary | ICD-10-CM

## 2022-01-09 DIAGNOSIS — R2681 Unsteadiness on feet: Secondary | ICD-10-CM

## 2022-01-09 DIAGNOSIS — Z981 Arthrodesis status: Secondary | ICD-10-CM

## 2022-01-09 DIAGNOSIS — R269 Unspecified abnormalities of gait and mobility: Secondary | ICD-10-CM

## 2022-01-09 NOTE — Therapy (Unsigned)
OUTPATIENT PHYSICAL THERAPY TREATMENT NOTE   Patient Name: Justin Nixon MRN: [id number] DOB:06/29/1976, 46 y.o., male, male Today's Date: 01/09/2022  PCP: Gracelyn NurseJohn D Johnston, MD REFERRING PROVIDER: Gracelyn NurseJohn D Johnston, MD  END OF SESSION:   PT End of Session - 01/09/22 0803     Visit Number 2    Number of Visits 17    Date for PT Re-Evaluation 03/04/22    Progress Note Due on Visit 10    PT Start Time 0807    PT Stop Time 0852    PT Time Calculation (min) 45 min    Equipment Utilized During Treatment Gait belt    Activity Tolerance Patient limited by fatigue;Patient limited by pain    Behavior During Therapy [facility] for tasks assessed/performed             Past Medical History:  Diagnosis Date   Anxiety    Arthritis    GERD (gastroesophageal reflux disease)    H/O   Hyperlipidemia    Schizophrenia (HCC)    Past Surgical History:  Procedure Laterality Date   ANTERIOR LATERAL LUMBAR FUSION WITH PERCUTANEOUS SCREW 1 LEVEL N/A 11/18/2021   Procedure: L4-5 LATERAL LUMBAR INTERBODY FUSION WITH POSTERIOR SPINAL FUSION;  Surgeon: Venetia NightYarbrough, Chester, MD;  Location: [facility];  Service: Neurosurgery;  Laterality: N/A;   APPLICATION OF INTRAOPERATIVE CT SCAN N/A 11/18/2021   Procedure: APPLICATION OF INTRAOPERATIVE CT SCAN;  Surgeon: Venetia NightYarbrough, Chester, MD;  Location: [facility];  Service: Neurosurgery;  Laterality: N/A;   COLONOSCOPY     CYST EXCISION     ELBOW ARTHROSCOPY Left 10/10/2020   Procedure: LEFT ELBOW ARTHROSCOPY WITH DEBRIDEMENT AND EXCISION OF LOOSE BODIES;  Surgeon: Christena FlakePoggi, John J, MD;  Location: [facility];  Service: Orthopedics;  Laterality: Left;   MYRINGOTOMY WITH TUBE PLACEMENT     ORIF ELBOW FRACTURE Left 08/28/2021   Procedure: OPEN REDUCTION INTERNAL FIXATION (ORIF) ELBOW/OLECRANON FRACTURE;  Surgeon: Christena FlakePoggi, John J, MD;  Location: [facility];  Service: Orthopedics;  Laterality: Left;   SHOULDER ARTHROSCOPY Left 03/18/2017   Procedure: ARTHROSCOPY SHOULDER with debridement  decompression and distal clavicle excision;  Surgeon: Christena FlakePoggi, John J, MD;  [facility];  Service: Orthopedics;  Laterality: Left;   SHOULDER ARTHROSCOPY WITH OPEN ROTATOR CUFF REPAIR Right 09/23/2016   Procedure: SHOULDER ARTHROSCOPY WITH OPEN ROTATOR CUFF REPAIR;  Surgeon: Christena FlakeJohn J Poggi, MD;  Location: [facility];  Service: Orthopedics;  Laterality: Right;   SHOULDER ARTHROSCOPY WITH SUBACROMIAL DECOMPRESSION Right 09/23/2016   Procedure: SHOULDER ARTHROSCOPY WITH SUBACROMIAL DECOMPRESSION AND DEBRIDEMENT;  Surgeon: Christena FlakeJohn J Poggi, MD;  Location: [facility];  Service: Orthopedics;  Laterality: Right;   Patient Active Problem List   Diagnosis Date Noted   S/P spinal fusion 11/18/2021   Olecranon fracture, left, open type I or II, initial encounter 08/28/2021    REFERRING DIAG: Z98.1 (ICD-10-CM) - History of lumbar fusion  THERAPY DIAG:  Muscle weakness (generalized)  S/P lumbar fusion  Abnormality of gait and mobility  Unsteadiness on feet  Rationale for Evaluation and Treatment Rehabilitation  PERTINENT HISTORY: Pt presents to physical therapy for evaluation of low back and LE pain and weakness L>R, following L4-L5 XLIF/PSF on 11/18/21. Pt states he was using a cane before surgery due to onset of LLE weakness. States he has now been using a RW since surgery. Pain ranges from a 5/10 to a 10/10. When aggravated pain is described as sharp and shooting into LLE; at baseline, pain is achy. Wearing clothes such as jeans  is painful due to sensitivity of skin; at home wears pajamas. Pt has not returned to driving; he is limited to household distance ambulation or maximum into a doctors office. He feels as though his LLE is going to "give out" when he is walking or weightbearing. 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 Reports frequent falling since surgery due to LLE giving out;  no injuries associated with falls.   PRECAUTIONS: Back - no bending, limited twisting, no lifting > 25#   OBJECTIVE: (objective measures completed at initial evaluation unless otherwise dated)  DIAGNOSTIC FINDINGS:  "Intraoperative images during L4-L5 anterior and posterior fusion. No evidence of immediate complication"   PATIENT SURVEYS:  Modified Oswestry (updated 01/09/22 due to incomplete form): 62% FOTO 23/41   SCREENING FOR RED FLAGS: Bowel or bladder incontinence: No Spinal tumors: No Cauda equina syndrome: No Compression fracture: No Abdominal aneurysm: No   COGNITION:           Overall cognitive status: Within functional limits for tasks assessed                          SENSATION: Light touch: Impaired  - increased sensitivity in left L2 and L3 dermatomes    MUSCLE LENGTH: Hamstrings: unable to test due to increase in sharp/shooting pain in hip flexors prior to HS stretch being felt  Thomas test: NT   POSTURE: decreased lumbar lordosis and posterior pelvic tilt   PALPATION: Right: hip flexors and lateral hip, glute med, piriformis, deep external rotators, QL Left: hip flexors, lateral hip and adductors (from proximal hip to mid-thigh), proximal hamstrings, L4-distal paraspinals, QL, all gluteals     LUMBAR ROM: not assessed due to back precautions    Active  A/PROM  eval  Flexion -  Extension -  Right lateral flexion -  Left lateral flexion -  Right rotation -  Left rotation -   (Blank rows = not tested)   LOWER EXTREMITY ROM:      Passive  Right eval Left eval  Hip flexion 100 90  Hip extension NT NT  Hip abduction 20 20  Hip adduction      Hip internal rotation      Hip external rotation      Knee flexion [facility] WFL  Knee extension [facility]  Ankle dorsiflexion [facility] WFL  Ankle plantarflexion      Ankle inversion      Ankle eversion       (Blank rows = not tested)   LOWER EXTREMITY MMT:     MMT Right eval Left eval  Hip flexion 4-* 4-*   Hip extension      Hip abduction 4+* 4-*  Hip adduction 4* 4*  Hip internal rotation 5 3*  Hip external rotation 5 4*  Knee flexion 5 4*  Knee extension 5 4+  Ankle dorsiflexion 4+ 3+  Ankle plantarflexion       (Blank rows = not tested)     FUNCTIONAL TESTS:  5 times sit to stand: 43.37 seconds using arms 6 minute walk test: next session 10 meter walk test: 24.3 seconds (comfortable) = 0.41 m/s using RW; terminated fast-pace for safety    STS mechanics: heavy use of triceps, posterior lean with frequent posterior LOB when performed w/o RW.    GAIT: Distance walked: <158ft  Assistive device utilized: Environmental consultant - 2 wheeled Level  of assistance: CGA Comments: increased reliance on UE support and decreased stance time during stance phase of LLE; bilateral trendelenburg; one LOB during ambulation while using RW due to LLE weakness with controlled descent to chair.        TODAY'S TREATMENT   SUBJECTIVE: Patient reports he is managing pain with Rx meds - Gabapentin and Celebrex. Patient reports   PAIN:  Are you having pain? Yes: NPRS scale: 6/10 Pain location: Pain in L thigh and knee, L     Cold pack (unbilled) - for anti-inflammatory and analgesic effect as needed for reduced pain and improved ability to participate in active PT intervention, along L lateral thigh and L buttock in R sidelying, x 5 minutes   Manual Therapy - for symptom modulation, soft tissue sensitivity and mobility, joint mobility, ROM   (Patient in right sidelying) STM and IASTM with Hypervolt, level 1 intensity; L gluteal musculature and L lateral thigh (lateral quads/hamstrings) for desensitization   Therapeutic Exercise - for improved soft tissue flexibility and extensibility as needed for ROM, improved strength as needed to improve performance of CKC activities/functional movements  For HEP review: Glute set; 2x10, 5 sec Abdominal brace/TrA contraction; x10, 5 sec Supine adductor ball squeeze; x10,  5 sec  Piriformis stretch; 10x, 1 sec  Patient education on desensitization and HEP, see below.        PATIENT EDUCATION:  Education details: Discussed at length with patient desensitization program to utilize at home and initiated HEP with baseline isometric drills with neutral spine. Provided handout for desensitization home program and HEP (Access Code [email or web address]) Person educated: Patient Education method: Explanation Education comprehension: verbalized understanding     HOME EXERCISE PROGRAM: Access Code [email or web address]     ASSESSMENT:   CLINICAL IMPRESSION: Patient is a 46 y.o. male who was seen today for physical therapy evaluation of low back and LE pain and weakness L>R, following L4-L5 XLIF/PSF on 11/18/21. Pt presents with global hip weakness, L > R; tremors associated with effortful muscle contraction such as during MMT. Pain was a significant limiting factor in assessing LE ROM and flexibility. Regarding functional activity, pt performs bed mobility well with adherence to log roll technique. STS is challenging with reliance on UE support; when performed without RW, pt demonstrates posterior lean with frequent LOB. Pt was unable clear hips from chair when attempted w/o UE support. Pt did experience one LOB with a controlled descent to chair during 10 meter walk test. Pt will benefit from skilled physical therapy to address above deficits in order to increase safety with functional activities and allow pt to return to work and home duties.      OBJECTIVE IMPAIRMENTS Abnormal gait, decreased activity tolerance, decreased balance, decreased endurance, decreased mobility, difficulty walking, decreased ROM, decreased strength, decreased safety awareness, impaired flexibility, impaired sensation, postural dysfunction, and pain.    ACTIVITY LIMITATIONS carrying, lifting, bending, sitting, standing, squatting, stairs, and transfers   PARTICIPATION LIMITATIONS: cleaning, laundry, driving,  shopping, community activity, occupation, and yard work   PERSONAL FACTORS Time since onset of injury/illness/exacerbation and 1-2 comorbidities: schizophrenia and anxiety  are also affecting patient's functional outcome.    REHAB POTENTIAL: Good   CLINICAL DECISION MAKING: Evolving/moderate complexity   EVALUATION COMPLEXITY: Moderate     GOALS: Goals reviewed with patient? Yes   SHORT TERM GOALS: Target date: 01/21/2022   Patient will be independent in home exercise program to improve strength/mobility for better functional independence with ADLs. Baseline: Goal status: INITIAL   2.  Pt will ambulate 200+ feet using RW without LOB or LLE buckling to demonstrate progression towards improved ambulatory endurance.  Baseline: 154ft  Goal status: INITIAL   3.   Patient will deny any falls over past 4 weeks to demonstrate improved safety at home.  Baseline: 4 falls since surgery  Goal status: INITIAL     LONG TERM GOALS: Target date: 03/04/2022   Patient will increase FOTO score to equal to or greater than 41 to demonstrate statistically significant improvement in mobility and quality of life.  Baseline: 01/07/22: 23/41 Goal status: INITIAL   2.   Patient (< 79 years old) will complete five times sit to stand test in < 10 seconds indicating an increased LE strength and improved balance. Baseline: 01/07/22: 43 seconds Goal status: INITIAL   3.  Patient will increase 10 meter walk test to >1.21m/s as to improve gait speed for better community ambulation and to reduce fall risk. Baseline: 01/07/22: 0.41 m/s Goal status: INITIAL   4.  Patient will increase six minute walk test distance to >1000 for progression to community ambulator and improve gait ability. Baseline: next session Goal status: INITIAL   5.  Patient will increase LLE gross strength to 4/5 as to improve functional strength for independent gait, increased standing tolerance and increased ADL ability. Baseline: 01/07/22:  ranging 3-4/5, please see objective  Goal status: INITIAL         PLAN: PT FREQUENCY: 2x/week   PT DURATION: 8 weeks   PLANNED INTERVENTIONS: Therapeutic exercises, Therapeutic activity, Neuromuscular re-education, Balance training, Gait training, Patient/Family education, Joint mobilization, Stair training, DME instructions, Dry Needling, Cryotherapy, Moist heat, and Manual therapy.   PLAN FOR NEXT SESSION: 6-minute walk test next visit. LE desensitization, global LE strengthening, core/lumbar strengthening, gait training, modalities for pain modulation as needed           Gertie Exon, PT 01/09/2022, 10:51 AM

## 2022-01-14 ENCOUNTER — Encounter: Payer: Self-pay | Admitting: Physical Therapy

## 2022-01-14 ENCOUNTER — Ambulatory Visit: Payer: Medicare Other | Admitting: Physical Therapy

## 2022-01-14 DIAGNOSIS — M545 Low back pain, unspecified: Secondary | ICD-10-CM

## 2022-01-14 DIAGNOSIS — R269 Unspecified abnormalities of gait and mobility: Secondary | ICD-10-CM

## 2022-01-14 DIAGNOSIS — Z981 Arthrodesis status: Secondary | ICD-10-CM

## 2022-01-14 DIAGNOSIS — R2681 Unsteadiness on feet: Secondary | ICD-10-CM

## 2022-01-14 DIAGNOSIS — M6281 Muscle weakness (generalized): Secondary | ICD-10-CM

## 2022-01-14 NOTE — Therapy (Signed)
OUTPATIENT PHYSICAL THERAPY TREATMENT NOTE   Patient Name: Justin Nixon MRN: [id number] DOB:1975-12-31, 46 y.o., male Today's Date: 01/14/2022  PCP: Baxter Hire, MD REFERRING PROVIDER: Meade Maw, MD  END OF SESSION:   PT End of Session - 01/14/22 1103     Visit Number 3    Number of Visits 17    Date for PT Re-Evaluation 03/04/22    Progress Note Due on Visit 10    PT Start Time 0819    PT Stop Time 0906    PT Time Calculation (min) 47 min    Equipment Utilized During Treatment Gait belt    Activity Tolerance Patient limited by fatigue;Patient limited by pain    Behavior During Therapy WFL for tasks assessed/performed             Past Medical History:  Diagnosis Date   Anxiety    Arthritis    GERD (gastroesophageal reflux disease)    H/O   Hyperlipidemia    Schizophrenia ([place])    Past Surgical History:  Procedure Laterality Date   ANTERIOR LATERAL LUMBAR FUSION WITH PERCUTANEOUS SCREW 1 LEVEL N/A 11/18/2021   Procedure: L4-5 LATERAL LUMBAR INTERBODY FUSION WITH POSTERIOR SPINAL FUSION;  Surgeon: Meade Maw, MD;  Location: [facility];  Service: Neurosurgery;  Laterality: N/A;   APPLICATION OF INTRAOPERATIVE CT SCAN N/A 11/18/2021   Procedure: APPLICATION OF INTRAOPERATIVE CT SCAN;  Surgeon: Meade Maw, MD;  Location: [facility];  Service: Neurosurgery;  Laterality: N/A;   COLONOSCOPY     CYST EXCISION     ELBOW ARTHROSCOPY Left 10/10/2020   Procedure: LEFT ELBOW ARTHROSCOPY WITH DEBRIDEMENT AND EXCISION OF LOOSE BODIES;  Surgeon: Corky Mull, MD;  Location: [facility];  Service: Orthopedics;  Laterality: Left;   MYRINGOTOMY WITH TUBE PLACEMENT     ORIF ELBOW FRACTURE Left 08/28/2021   Procedure: OPEN REDUCTION INTERNAL FIXATION (ORIF) ELBOW/OLECRANON FRACTURE;  Surgeon: Corky Mull, MD;  Location: [facility];  Service: Orthopedics;  Laterality: Left;   SHOULDER ARTHROSCOPY Left 03/18/2017   Procedure: ARTHROSCOPY SHOULDER with debridement  decompression and distal clavicle excision;  Surgeon: Corky Mull, MD;  Location: [place];  Service: Orthopedics;  Laterality: Left;   SHOULDER ARTHROSCOPY WITH OPEN ROTATOR CUFF REPAIR Right 09/23/2016   Procedure: SHOULDER ARTHROSCOPY WITH OPEN ROTATOR CUFF REPAIR;  Surgeon: Corky Mull, MD;  Location: [facility];  Service: Orthopedics;  Laterality: Right;   SHOULDER ARTHROSCOPY WITH SUBACROMIAL DECOMPRESSION Right 09/23/2016   Procedure: SHOULDER ARTHROSCOPY WITH SUBACROMIAL DECOMPRESSION AND DEBRIDEMENT;  Surgeon: Corky Mull, MD;  Location: [facility];  Service: Orthopedics;  Laterality: Right;   Patient Active Problem List   Diagnosis Date Noted   S/P spinal fusion 11/18/2021   Olecranon fracture, left, open type I or II, initial encounter 08/28/2021      REFERRING DIAG: Z98.1 (ICD-10-CM) - History of lumbar fusion   THERAPY DIAG:  Muscle weakness (generalized)   S/P lumbar fusion   Abnormality of gait and mobility   Unsteadiness on feet   Rationale for Evaluation and Treatment Rehabilitation   PERTINENT HISTORY: Pt presents to physical therapy for evaluation of low back and LE pain and weakness L>R, following L4-L5 XLIF/PSF on 11/18/21. Pt states he was using a cane before surgery due to onset of LLE weakness. States he has now been using a RW since surgery. Pain ranges from a 5/10 to a 10/10. When aggravated pain is described as sharp and shooting into LLE; at baseline, pain  is achy. Wearing clothes such as jeans is painful due to sensitivity of skin; at home wears pajamas. Pt has not returned to driving; he is limited to household distance ambulation or maximum into a doctors office. He feels as though his LLE is going to "give out" when he is walking or weightbearing. 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 Reports frequent falling since surgery due to LLE  giving out; no injuries associated with falls.    PRECAUTIONS: Back - no bending, limited twisting, no lifting > 25#     OBJECTIVE: (objective measures completed at initial evaluation unless otherwise dated)   DIAGNOSTIC FINDINGS:  "Intraoperative images during L4-L5 anterior and posterior fusion. No evidence of immediate complication"   PATIENT SURVEYS:  Modified Oswestry (updated 01/09/22 due to incomplete form): 62% FOTO 23/41   SCREENING FOR RED FLAGS: Bowel or bladder incontinence: No Spinal tumors: No Cauda equina syndrome: No Compression fracture: No Abdominal aneurysm: No   COGNITION:           Overall cognitive status: Within functional limits for tasks assessed                          SENSATION: Light touch: Impaired  - increased sensitivity in left L2 and L3 dermatomes    MUSCLE LENGTH: Hamstrings: unable to test due to increase in sharp/shooting pain in hip flexors prior to HS stretch being felt  Thomas test: NT   POSTURE: decreased lumbar lordosis and posterior pelvic tilt   PALPATION: Right: hip flexors and lateral hip, glute med, piriformis, deep external rotators, QL Left: hip flexors, lateral hip and adductors (from proximal hip to mid-thigh), proximal hamstrings, L4-distal paraspinals, QL, all gluteals     LUMBAR ROM: not assessed due to back precautions    Active  A/PROM  eval  Flexion -  Extension -  Right lateral flexion -  Left lateral flexion -  Right rotation -  Left rotation -   (Blank rows = not tested)   LOWER EXTREMITY ROM:      Passive  Right eval Left eval  Hip flexion 100 90  Hip extension NT NT  Hip abduction 20 20  Hip adduction      Hip internal rotation      Hip external rotation      Knee flexion [facility] WFL  Knee extension [facility]  Ankle dorsiflexion [facility] WFL  Ankle plantarflexion      Ankle inversion      Ankle eversion       (Blank rows = not tested)   LOWER EXTREMITY MMT:     MMT Right eval Left eval  Hip  flexion 4-* 4-*  Hip extension      Hip abduction 4+* 4-*  Hip adduction 4* 4*  Hip internal rotation 5 3*  Hip external rotation 5 4*  Knee flexion 5 4*  Knee extension 5 4+  Ankle dorsiflexion 4+ 3+  Ankle plantarflexion       (Blank rows = not tested)     FUNCTIONAL TESTS:  5 times sit to stand: 43.37 seconds using arms 6 minute walk test (performed 01/14/22): 280 feet 10 meter walk test: 24.3 seconds (comfortable) = 0.41 m/s using RW; terminated fast-pace for safety    STS mechanics: heavy use of triceps, posterior lean with frequent posterior LOB when performed w/o RW.  GAIT: Distance walked: <143ft at eval Assistive device utilized: Environmental consultant - 2 wheeled Level of assistance: CGA Comments: increased reliance on UE support and decreased stance time during stance phase of LLE; bilateral trendelenburg; one LOB during ambulation while using RW due to LLE weakness with controlled descent to chair.        TODAY'S TREATMENT    SUBJECTIVE: Patient reports having some intermittent swelling since his last visit. Patient reports "a little bit" of pain at arrival to PT. Patient reports ongoing sensitivity affecting his L thigh/L calf at arrival to PT. Patient reports continued use of his walker - he reports intermittently stumbling when attempting to get up without his walker. Pt reports doing okay with his HEP given last visit. He reports using Q-tip for desensitization along L lateral thigh - it feels like "sting."    PAIN:  Are you having pain? Yes: NPRS scale: 4/10 Pain location: Pain in L thigh and knee, L         Manual Therapy - for symptom modulation, soft tissue sensitivity and mobility, joint mobility, ROM    (Patient in right sidelying) STM and IASTM with Hypervolt, level 1 intensity; L gluteal musculature and L lateral thigh (lateral quads/hamstrings) for desensitization     Therapeutic Exercise - for improved soft tissue flexibility and extensibility as needed for ROM,  improved strength as needed to improve performance of CKC activities/functional movements   For HEP review: Piriformis stretch; 20x, 1 sec Abdominal brace/TrA contraction; x10, 5 sec Abdominal brace/TrA contraction with alternating adductor ball squeeze/hip ABD iso against belt; x10, 5 sec  Abdominal brace with Bridge c neutral spine, partial range; 2x10  6-minute walk with front-wheel walker  Patient education: reviewed parameters for desensitization training and encouraged using varying textures on affected body region.        *not today* Glute set; 2x10, 5 sec Cold pack (unbilled) - for anti-inflammatory and analgesic effect as needed for reduced pain and improved ability to participate in active PT intervention, along L lateral thigh and L buttock in R sidelying, x 5 minutes     PATIENT EDUCATION:  Education details: Discussed at length with patient desensitization program to utilize at home and initiated HEP with baseline isometric drills with neutral spine. Provided handout for desensitization home program and HEP (Access Code [email or web address]) Person educated: Patient Education method: Explanation Education comprehension: verbalized understanding     HOME EXERCISE PROGRAM: Access Code [email or web address]       ASSESSMENT:   CLINICAL IMPRESSION: Patient is able to perform 6-minute walk test up to 280 feet, short of community-level ambulation, but significantly greater distance than that obtained on his eval date. Patient has ongoing hypersensitivity affecting L lateral hip and L lateral thigh and L lower limb weakness with intermittent buckling. Patient is able to significantly progress with unloaded exercise without net increase in pain. He is still notably functionally limited given post-operative pain status and LLE weakness. Pt will benefit from skilled physical therapy to address above deficits in order to increase safety with functional activities and allow pt to return to work and home  duties.      OBJECTIVE IMPAIRMENTS Abnormal gait, decreased activity tolerance, decreased balance, decreased endurance, decreased mobility, difficulty walking, decreased ROM, decreased strength, decreased safety awareness, impaired flexibility, impaired sensation, postural dysfunction, and pain.    ACTIVITY LIMITATIONS carrying, lifting, bending, sitting, standing, squatting, stairs, and transfers   PARTICIPATION LIMITATIONS: cleaning, laundry, driving, shopping, community activity, occupation, and yard work   PERSONAL FACTORS  Time since onset of injury/illness/exacerbation and 1-2 comorbidities: schizophrenia and anxiety  are also affecting patient's functional outcome.    REHAB POTENTIAL: Good   CLINICAL DECISION MAKING: Evolving/moderate complexity   EVALUATION COMPLEXITY: Moderate     GOALS: Goals reviewed with patient? Yes   SHORT TERM GOALS: Target date: 01/21/2022   Patient will be independent in home exercise program to improve strength/mobility for better functional independence with ADLs. Baseline: Goal status: INITIAL   2.  Pt will ambulate 200+ feet using RW without LOB or LLE buckling to demonstrate progression towards improved ambulatory endurance.  Baseline: 123ft  Goal status: INITIAL   3.   Patient will deny any falls over past 4 weeks to demonstrate improved safety at home.  Baseline: 4 falls since surgery  Goal status: INITIAL     LONG TERM GOALS: Target date: 03/04/2022   Patient will increase FOTO score to equal to or greater than 41 to demonstrate statistically significant improvement in mobility and quality of life.  Baseline: 01/07/22: 23/41 Goal status: INITIAL   2.   Patient (< 61 years old) will complete five times sit to stand test in < 10 seconds indicating an increased LE strength and improved balance. Baseline: 01/07/22: 43 seconds Goal status: INITIAL   3.  Patient will increase 10 meter walk test to >1.34m/s as to improve gait speed for better  community ambulation and to reduce fall risk. Baseline: 01/07/22: 0.41 m/s Goal status: INITIAL   4.  Patient will increase six minute walk test distance to >1000 for progression to community ambulator and improve gait ability. Baseline: next session Goal status: INITIAL   5.  Patient will increase LLE gross strength to 4/5 as to improve functional strength for independent gait, increased standing tolerance and increased ADL ability. Baseline: 01/07/22: ranging 3-4/5, please see objective  Goal status: INITIAL         PLAN: PT FREQUENCY: 2x/week   PT DURATION: 8 weeks   PLANNED INTERVENTIONS: Therapeutic exercises, Therapeutic activity, Neuromuscular re-education, Balance training, Gait training, Patient/Family education, Joint mobilization, Stair training, DME instructions, Dry Needling, Cryotherapy, Moist heat, and Manual therapy.   PLAN FOR NEXT SESSION: LE desensitization, global LE strengthening, core/lumbar strengthening, gait training, modalities for pain modulation as needed          Valentina Gu, PT, DPT [id number]  Eilleen Kempf, PT 01/14/2022, 3:03 PM

## 2022-01-16 ENCOUNTER — Ambulatory Visit: Payer: Medicare Other | Admitting: Physical Therapy

## 2022-01-16 DIAGNOSIS — M6281 Muscle weakness (generalized): Secondary | ICD-10-CM

## 2022-01-16 DIAGNOSIS — R2681 Unsteadiness on feet: Secondary | ICD-10-CM

## 2022-01-16 DIAGNOSIS — Z981 Arthrodesis status: Secondary | ICD-10-CM

## 2022-01-16 DIAGNOSIS — M545 Low back pain, unspecified: Secondary | ICD-10-CM

## 2022-01-16 DIAGNOSIS — R269 Unspecified abnormalities of gait and mobility: Secondary | ICD-10-CM

## 2022-01-16 NOTE — Therapy (Signed)
OUTPATIENT PHYSICAL THERAPY TREATMENT NOTE   Patient Name: Justin Nixon MRN: [id number] DOB:29-Jan-1976, 46 y.o., male Today's Date: 01/18/2022  PCP: Gracelyn Nurse, MD REFERRING PROVIDER: Gracelyn Nurse, MD  END OF SESSION:   PT End of Session - 01/18/22 2341     Visit Number 4    Number of Visits 17    Date for PT Re-Evaluation 03/04/22    Progress Note Due on Visit 10    PT Start Time 0817    PT Stop Time 0906    PT Time Calculation (min) 49 min    Equipment Utilized During Treatment Gait belt    Activity Tolerance Patient limited by fatigue;Patient limited by pain    Behavior During Therapy WFL for tasks assessed/performed             Past Medical History:  Diagnosis Date   Anxiety    Arthritis    GERD (gastroesophageal reflux disease)    H/O   Hyperlipidemia    Schizophrenia (HCC)    Past Surgical History:  Procedure Laterality Date   ANTERIOR LATERAL LUMBAR FUSION WITH PERCUTANEOUS SCREW 1 LEVEL N/A 11/18/2021   Procedure: L4-5 LATERAL LUMBAR INTERBODY FUSION WITH POSTERIOR SPINAL FUSION;  Surgeon: Venetia Night, MD;  Location: [facility];  Service: Neurosurgery;  Laterality: N/A;   APPLICATION OF INTRAOPERATIVE CT SCAN N/A 11/18/2021   Procedure: APPLICATION OF INTRAOPERATIVE CT SCAN;  Surgeon: Venetia Night, MD;  Location: [facility];  Service: Neurosurgery;  Laterality: N/A;   COLONOSCOPY     CYST EXCISION     ELBOW ARTHROSCOPY Left 10/10/2020   Procedure: LEFT ELBOW ARTHROSCOPY WITH DEBRIDEMENT AND EXCISION OF LOOSE BODIES;  Surgeon: Christena Flake, MD;  Location: [facility];  Service: Orthopedics;  Laterality: Left;   MYRINGOTOMY WITH TUBE PLACEMENT     ORIF ELBOW FRACTURE Left 08/28/2021   Procedure: OPEN REDUCTION INTERNAL FIXATION (ORIF) ELBOW/OLECRANON FRACTURE;  Surgeon: Christena Flake, MD;  Location: [facility];  Service: Orthopedics;  Laterality: Left;   SHOULDER ARTHROSCOPY Left 03/18/2017   Procedure: ARTHROSCOPY SHOULDER with debridement  decompression and distal clavicle excision;  Surgeon: Christena Flake, MD;  Location: [facility];  Service: Orthopedics;  Laterality: Left;   SHOULDER ARTHROSCOPY WITH OPEN ROTATOR CUFF REPAIR Right 09/23/2016   Procedure: SHOULDER ARTHROSCOPY WITH OPEN ROTATOR CUFF REPAIR;  Surgeon: Christena Flake, MD;  Location: [facility];  Service: Orthopedics;  Laterality: Right;   SHOULDER ARTHROSCOPY WITH SUBACROMIAL DECOMPRESSION Right 09/23/2016   Procedure: SHOULDER ARTHROSCOPY WITH SUBACROMIAL DECOMPRESSION AND DEBRIDEMENT;  Surgeon: Christena Flake, MD;  Location: [facility];  Service: Orthopedics;  Laterality: Right;   Patient Active Problem List   Diagnosis Date Noted   S/P spinal fusion 11/18/2021   Olecranon fracture, left, open type I or II, initial encounter 08/28/2021     REFERRING DIAG: Z98.1 (ICD-10-CM) - History of lumbar fusion   THERAPY DIAG:  Muscle weakness (generalized)   S/P lumbar fusion   Abnormality of gait and mobility   Unsteadiness on feet   Rationale for Evaluation and Treatment Rehabilitation   PERTINENT HISTORY: Pt presents to physical therapy for evaluation of low back and LE pain and weakness L>R, following L4-L5 XLIF/PSF on 11/18/21. Pt states he was using a cane before surgery due to onset of LLE weakness. States he has now been using a RW since surgery. Pain ranges from a 5/10 to a 10/10. When aggravated pain is described as sharp and shooting into LLE; at baseline, pain  is achy. Wearing clothes such as jeans is painful due to sensitivity of skin; at home wears pajamas. Pt has not returned to driving; he is limited to household distance ambulation or maximum into a doctors office. He feels as though his LLE is going to "give out" when he is walking or weightbearing. 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 Reports frequent falling since surgery due to LLE giving  out; no injuries associated with falls.    PRECAUTIONS: Back - no bending, limited twisting, no lifting > 25#     OBJECTIVE: (objective measures completed at initial evaluation unless otherwise dated)   DIAGNOSTIC FINDINGS:  "Intraoperative images during L4-L5 anterior and posterior fusion. No evidence of immediate complication"   PATIENT SURVEYS:  Modified Oswestry (updated 01/09/22 due to incomplete form): 62% FOTO 23/41   SCREENING FOR RED FLAGS: Bowel or bladder incontinence: No Spinal tumors: No Cauda equina syndrome: No Compression fracture: No Abdominal aneurysm: No   COGNITION:           Overall cognitive status: Within functional limits for tasks assessed                          SENSATION: Light touch: Impaired  - increased sensitivity in left L2 and L3 dermatomes    MUSCLE LENGTH: Hamstrings: unable to test due to increase in sharp/shooting pain in hip flexors prior to HS stretch being felt  Thomas test: NT   POSTURE: decreased lumbar lordosis and posterior pelvic tilt   PALPATION: Right: hip flexors and lateral hip, glute med, piriformis, deep external rotators, QL Left: hip flexors, lateral hip and adductors (from proximal hip to mid-thigh), proximal hamstrings, L4-distal paraspinals, QL, all gluteals     LUMBAR ROM: not assessed due to back precautions    Active  A/PROM  eval  Flexion -  Extension -  Right lateral flexion -  Left lateral flexion -  Right rotation -  Left rotation -   (Blank rows = not tested)   LOWER EXTREMITY ROM:      Passive  Right eval Left eval  Hip flexion 100 90  Hip extension NT NT  Hip abduction 20 20  Hip adduction      Hip internal rotation      Hip external rotation      Knee flexion [facility] WFL  Knee extension [facility]  Ankle dorsiflexion [facility] WFL  Ankle plantarflexion      Ankle inversion      Ankle eversion       (Blank rows = not tested)   LOWER EXTREMITY MMT:     MMT Right eval Left eval  Hip flexion 4-*  4-*  Hip extension      Hip abduction 4+* 4-*  Hip adduction 4* 4*  Hip internal rotation 5 3*  Hip external rotation 5 4*  Knee flexion 5 4*  Knee extension 5 4+  Ankle dorsiflexion 4+ 3+  Ankle plantarflexion       (Blank rows = not tested)     FUNCTIONAL TESTS:  5 times sit to stand: 43.37 seconds using arms 6 minute walk test (performed 01/14/22): 280 feet 10 meter walk test: 24.3 seconds (comfortable) = 0.41 m/s using RW; terminated fast-pace for safety    STS mechanics: heavy use of triceps, posterior lean with frequent posterior LOB when performed w/o RW.  GAIT: Distance walked: <186ft at eval Assistive device utilized: Environmental consultant - 2 wheeled Level of assistance: CGA Comments: increased reliance on UE support and decreased stance time during stance phase of LLE; bilateral trendelenburg; one LOB during ambulation while using RW due to LLE weakness with controlled descent to chair.        TODAY'S TREATMENT    SUBJECTIVE: Patient reports pain and sensitivity mainly affecting L lateral hip and down to his lateral L knee. Patient reports working on desensitization at home.    PAIN:  Are you having pain? Yes: NPRS scale: 4/10 Pain location: Pain in L thigh and knee, L         Manual Therapy - for symptom modulation, soft tissue sensitivity and mobility, joint mobility, ROM    (Patient in right sidelying) Desensitization with varying textures, 2 minutes with each texture; utilized facial tissue, paper towel, bath towel, and vibration from [employer]      Therapeutic Exercise - for improved soft tissue flexibility and extensibility as needed for ROM, improved strength as needed to improve performance of CKC activities/functional movements   For HEP review: Piriformis stretch; 20x, 1 sec  Abdominal brace/TrA contraction with alternating adductor ball squeeze/hip ABD iso against belt; 2x10, 5 sec  Abdominal brace with Bridge c neutral spine, partial range; 2x10 Abdominal  brace with alternating march; 1x10 alternating R/L  Sit to stand from raised table; maintaining neutral spine; 2x5 (approx 55 deg knee flexion)    Patient education: reviewed parameters for desensitization training and encouraged using varying textures on affected body region.      Cold pack (unbilled) - for anti-inflammatory and analgesic effect as needed for reduced pain and improved ability to participate in active PT intervention, along L lateral thigh and L buttock in R sidelying, x 5 minutes     *not today* 6-minute walk with front-wheel walker Abdominal brace/TrA contraction; x10, 5 sec Glute set; 2x10, 5 sec        PATIENT EDUCATION:  Education details: Discussed at length with patient desensitization program to utilize at home and initiated HEP with baseline isometric drills with neutral spine. Provided handout for desensitization home program and HEP (Access Code [email or web address]) Person educated: Patient Education method: Explanation Education comprehension: verbalized understanding     HOME EXERCISE PROGRAM: Access Code [email or web address]       ASSESSMENT:   CLINICAL IMPRESSION: Patient is able to perform 6-minute walk test up to 280 feet, short of community-level ambulation, but significantly greater distance than that obtained on his eval date. Patient has ongoing hypersensitivity affecting L lateral hip and L lateral thigh and L lower limb weakness with intermittent buckling. Patient is able to significantly progress with unloaded exercise without net increase in pain. He is still notably functionally limited given post-operative pain status and LLE weakness. Pt will benefit from skilled physical therapy to address above deficits in order to increase safety with functional activities and allow pt to return to work and home duties.      OBJECTIVE IMPAIRMENTS Abnormal gait, decreased activity tolerance, decreased balance, decreased endurance, decreased mobility, difficulty walking,  decreased ROM, decreased strength, decreased safety awareness, impaired flexibility, impaired sensation, postural dysfunction, and pain.    ACTIVITY LIMITATIONS carrying, lifting, bending, sitting, standing, squatting, stairs, and transfers   PARTICIPATION LIMITATIONS: cleaning, laundry, driving, shopping, community activity, occupation, and yard work   PERSONAL FACTORS Time since onset of injury/illness/exacerbation and 1-2 comorbidities: schizophrenia and anxiety  are also affecting patient's functional outcome.    REHAB  POTENTIAL: Good   CLINICAL DECISION MAKING: Evolving/moderate complexity   EVALUATION COMPLEXITY: Moderate     GOALS: Goals reviewed with patient? Yes   SHORT TERM GOALS: Target date: 01/21/2022   Patient will be independent in home exercise program to improve strength/mobility for better functional independence with ADLs. Baseline: Goal status: INITIAL   2.  Pt will ambulate 200+ feet using RW without LOB or LLE buckling to demonstrate progression towards improved ambulatory endurance.  Baseline: 13ft  Goal status: INITIAL   3.   Patient will deny any falls over past 4 weeks to demonstrate improved safety at home.  Baseline: 4 falls since surgery  Goal status: INITIAL     LONG TERM GOALS: Target date: 03/04/2022   Patient will increase FOTO score to equal to or greater than 41 to demonstrate statistically significant improvement in mobility and quality of life.  Baseline: 01/07/22: 23/41 Goal status: INITIAL   2.   Patient (< 14 years old) will complete five times sit to stand test in < 10 seconds indicating an increased LE strength and improved balance. Baseline: 01/07/22: 43 seconds Goal status: INITIAL   3.  Patient will increase 10 meter walk test to >1.14m/s as to improve gait speed for better community ambulation and to reduce fall risk. Baseline: 01/07/22: 0.41 m/s Goal status: INITIAL   4.  Patient will increase six minute walk test distance to >1000  for progression to community ambulator and improve gait ability. Baseline: 01/14/22: 280 feet Goal status: INITIAL   5.  Patient will increase LLE gross strength to 4/5 as to improve functional strength for independent gait, increased standing tolerance and increased ADL ability. Baseline: 01/07/22: ranging 3-4/5, please see objective  Goal status: INITIAL         PLAN: PT FREQUENCY: 2x/week   PT DURATION: 8 weeks   PLANNED INTERVENTIONS: Therapeutic exercises, Therapeutic activity, Neuromuscular re-education, Balance training, Gait training, Patient/Family education, Joint mobilization, Stair training, DME instructions, Dry Needling, Cryotherapy, Moist heat, and Manual therapy.   PLAN FOR NEXT SESSION: LE desensitization, global LE strengthening, core/lumbar strengthening, gait training, modalities for pain modulation as ne  THIS NOTE IS INCOMPLETE, PLEASE DO NOT REFERENCE FOR INFORMATION  Consuela Mimes, PT, DPT #[id number]  Gertie Exon, PT 01/18/2022, 11:41 PM

## 2022-01-21 ENCOUNTER — Ambulatory Visit: Payer: Medicare Other | Admitting: Physical Therapy

## 2022-01-21 DIAGNOSIS — Z981 Arthrodesis status: Secondary | ICD-10-CM

## 2022-01-21 DIAGNOSIS — M545 Low back pain, unspecified: Secondary | ICD-10-CM

## 2022-01-21 DIAGNOSIS — R269 Unspecified abnormalities of gait and mobility: Secondary | ICD-10-CM

## 2022-01-21 DIAGNOSIS — M6281 Muscle weakness (generalized): Secondary | ICD-10-CM

## 2022-01-21 DIAGNOSIS — R2681 Unsteadiness on feet: Secondary | ICD-10-CM

## 2022-01-21 NOTE — Therapy (Signed)
OUTPATIENT PHYSICAL THERAPY TREATMENT NOTE   Patient Name: Justin Nixon MRN: [id number] DOB:Jan 12, 1976, 46 y.o., male Today's Date: 01/22/2022  PCP: Gracelyn Nurse, MD REFERRING PROVIDER: Gracelyn Nurse, MD  END OF SESSION:   PT End of Session - 01/22/22 1314     Visit Number 5    Number of Visits 17    Date for PT Re-Evaluation 03/04/22    Progress Note Due on Visit 10    PT Start Time 0902    PT Stop Time 0945    PT Time Calculation (min) 43 min    Equipment Utilized During Treatment Gait belt    Activity Tolerance Patient limited by fatigue;Patient limited by pain    Behavior During Therapy WFL for tasks assessed/performed             Past Medical History:  Diagnosis Date   Anxiety    Arthritis    GERD (gastroesophageal reflux disease)    H/O   Hyperlipidemia    Schizophrenia (HCC)    Past Surgical History:  Procedure Laterality Date   ANTERIOR LATERAL LUMBAR FUSION WITH PERCUTANEOUS SCREW 1 LEVEL N/A 11/18/2021   Procedure: L4-5 LATERAL LUMBAR INTERBODY FUSION WITH POSTERIOR SPINAL FUSION;  Surgeon: Venetia Night, MD;  Location: [facility];  Service: Neurosurgery;  Laterality: N/A;   APPLICATION OF INTRAOPERATIVE CT SCAN N/A 11/18/2021   Procedure: APPLICATION OF INTRAOPERATIVE CT SCAN;  Surgeon: Venetia Night, MD;  Location: [facility];  Service: Neurosurgery;  Laterality: N/A;   COLONOSCOPY     CYST EXCISION     ELBOW ARTHROSCOPY Left 10/10/2020   Procedure: LEFT ELBOW ARTHROSCOPY WITH DEBRIDEMENT AND EXCISION OF LOOSE BODIES;  Surgeon: Christena Flake, MD;  Location: [facility];  Service: Orthopedics;  Laterality: Left;   MYRINGOTOMY WITH TUBE PLACEMENT     ORIF ELBOW FRACTURE Left 08/28/2021   Procedure: OPEN REDUCTION INTERNAL FIXATION (ORIF) ELBOW/OLECRANON FRACTURE;  Surgeon: Christena Flake, MD;  Location: [facility];  Service: Orthopedics;  Laterality: Left;   SHOULDER ARTHROSCOPY Left 03/18/2017   Procedure: ARTHROSCOPY SHOULDER with debridement  decompression and distal clavicle excision;  Surgeon: Christena Flake, MD;  Location: [facility];  Service: Orthopedics;  Laterality: Left;   SHOULDER ARTHROSCOPY WITH OPEN ROTATOR CUFF REPAIR Right 09/23/2016   Procedure: SHOULDER ARTHROSCOPY WITH OPEN ROTATOR CUFF REPAIR;  Surgeon: Christena Flake, MD;  Location: [facility];  Service: Orthopedics;  Laterality: Right;   SHOULDER ARTHROSCOPY WITH SUBACROMIAL DECOMPRESSION Right 09/23/2016   Procedure: SHOULDER ARTHROSCOPY WITH SUBACROMIAL DECOMPRESSION AND DEBRIDEMENT;  Surgeon: Christena Flake, MD;  Location: [facility];  Service: Orthopedics;  Laterality: Right;   Patient Active Problem List   Diagnosis Date Noted   S/P spinal fusion 11/18/2021   Olecranon fracture, left, open type I or II, initial encounter 08/28/2021     REFERRING DIAG: Z98.1 (ICD-10-CM) - History of lumbar fusion   THERAPY DIAG:  Muscle weakness (generalized)   S/P lumbar fusion   Abnormality of gait and mobility   Unsteadiness on feet   Rationale for Evaluation and Treatment Rehabilitation   PERTINENT HISTORY: Pt presents to physical therapy for evaluation of low back and LE pain and weakness L>R, following L4-L5 XLIF/PSF on 11/18/21. Pt states he was using a cane before surgery due to onset of LLE weakness. States he has now been using a RW since surgery. Pain ranges from a 5/10 to a 10/10. When aggravated pain is described as sharp and shooting into LLE; at baseline, pain  is achy. Wearing clothes such as jeans is painful due to sensitivity of skin; at home wears pajamas. Pt has not returned to driving; he is limited to household distance ambulation or maximum into a doctors office. He feels as though his LLE is going to "give out" when he is walking or weightbearing. 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 Reports frequent falling since surgery due to LLE giving  out; no injuries associated with falls.    PRECAUTIONS: Back - no bending, limited twisting, no lifting > 25#     OBJECTIVE: (objective measures completed at initial evaluation unless otherwise dated)   DIAGNOSTIC FINDINGS:  "Intraoperative images during L4-L5 anterior and posterior fusion. No evidence of immediate complication"   PATIENT SURVEYS:  Modified Oswestry (updated 01/09/22 due to incomplete form): 62% FOTO 23/41   SCREENING FOR RED FLAGS: Bowel or bladder incontinence: No Spinal tumors: No Cauda equina syndrome: No Compression fracture: No Abdominal aneurysm: No   COGNITION:           Overall cognitive status: Within functional limits for tasks assessed                          SENSATION: Light touch: Impaired  - increased sensitivity in left L2 and L3 dermatomes    MUSCLE LENGTH: Hamstrings: unable to test due to increase in sharp/shooting pain in hip flexors prior to HS stretch being felt  Thomas test: NT   POSTURE: decreased lumbar lordosis and posterior pelvic tilt   PALPATION: Right: hip flexors and lateral hip, glute med, piriformis, deep external rotators, QL Left: hip flexors, lateral hip and adductors (from proximal hip to mid-thigh), proximal hamstrings, L4-distal paraspinals, QL, all gluteals   OBSERVATION 01/21/22: No bruising, ecchymosis, increased tissue temperature, or erythema observed along L flank, L gluteal region, or adjacent to surgical incisions. No sign of infection or delayed healing   LUMBAR ROM: not assessed due to back precautions    Active  A/PROM  eval  Flexion -  Extension -  Right lateral flexion -  Left lateral flexion -  Right rotation -  Left rotation -   (Blank rows = not tested)   LOWER EXTREMITY ROM:      Passive  Right eval Left eval  Hip flexion 100 90  Hip extension NT NT  Hip abduction 20 20  Hip adduction      Hip internal rotation      Hip external rotation      Knee flexion [facility]  Knee extension [facility]  Ankle dorsiflexion [facility] WFL  Ankle plantarflexion      Ankle inversion      Ankle eversion       (Blank rows = not tested)   LOWER EXTREMITY MMT:     MMT Right eval Left eval  Hip flexion 4-* 4-*  Hip extension      Hip abduction 4+* 4-*  Hip adduction 4* 4*  Hip internal rotation 5 3*  Hip external rotation 5 4*  Knee flexion 5 4*  Knee extension 5 4+  Ankle dorsiflexion 4+ 3+  Ankle plantarflexion       (Blank rows = not tested)     FUNCTIONAL TESTS:  5 times sit to stand: 43.37 seconds using arms 6 minute walk test (performed 01/14/22): 280 feet 10 meter walk test: 24.3 seconds (comfortable) = 0.41  m/s using RW; terminated fast-pace for safety    STS mechanics: heavy use of triceps, posterior lean with frequent posterior LOB when performed w/o RW.    GAIT: Distance walked: <169ft at eval Assistive device utilized: Environmental consultant - 2 wheeled Level of assistance: CGA Comments: increased reliance on UE support and decreased stance time during stance phase of LLE; bilateral trendelenburg; one LOB during ambulation while using RW due to LLE weakness with controlled descent to chair.        TODAY'S TREATMENT    SUBJECTIVE: Patient reports compliance with home program/desensitization work. He reports one time bending and twisting when reaching to floor and aggravating L flank region. He took Tylenol to mitigate pain. He reports increase in swelling after this.    PAIN:  Are you having pain? Yes: NPRS scale: 6/10 Pain location: Pain in L thigh and knee, L       Cold pack (unbilled) - utilized pre-treatment for anti-inflammatory and analgesic effect as needed for reduced pain and improved ability to participate in active PT intervention, along L lateral thigh and L buttock in R sidelying, x 5 minutes      Manual Therapy - for symptom modulation, soft tissue sensitivity and mobility, joint mobility, ROM    STM and IASTM with Hypervolt: L quadratus lumborum, L erector  spinae L3-5, L gluteal musculature       Therapeutic Exercise - for improved soft tissue flexibility and extensibility as needed for ROM, improved strength as needed to improve performance of CKC activities/functional movements   In hooklying: Piriformis stretch; 20x, 1 sec Abdominal brace with Bridge c neutral spine, partial range; 2x10 Abdominal brace with alternating march; 1x10 alternating R/L   Sit to stand from raised table; maintaining neutral spine; 2x5 (approx 70 deg knee flexion)   Standing toe tap onto Airex pad while standing in front-wheel walker; 2x10 alternating R/L for weight shifting and weight acceptance onto L lower limb   Standing heel raise; 2x10 with bilat support on front-wheeled walker   Patient education: reviewed BLT activity restrictions and discussed no significant integumentary findings along L iliolumbar region.            *not today* Abdominal brace/TrA contraction with alternating adductor ball squeeze/hip ABD iso against belt; 2x10, 5 sec  6-minute walk with front-wheel walker Abdominal brace/TrA contraction; x10, 5 sec Glute set; 2x10, 5 sec         PATIENT EDUCATION:  Education details: Discussed at length with patient desensitization program to utilize at home and initiated HEP with baseline isometric drills with neutral spine. Provided handout for desensitization home program and HEP (Access Code [email or web address]) Person educated: Patient Education method: Explanation Education comprehension: verbalized understanding     HOME EXERCISE PROGRAM: Access Code [email or web address]       ASSESSMENT:   CLINICAL IMPRESSION: Patient has ongoing L thigh hypersensitivity and is continuing with desensitization program - reiterated principles of desensitization today including use of various textures along affected region. Patient demonstrates ongoing LLE weakness as demonstrated by intermittent buckling during sit to stand from raised table and during toe tapping  drill. Pt needs significant work on strengthening and progressive LLE weightbearing for return to independent gait and to reduce fall risk. Pt will benefit from skilled physical therapy to address above deficits in order to increase safety with functional activities and allow pt to return to work and home duties.      OBJECTIVE IMPAIRMENTS Abnormal gait, decreased activity tolerance, decreased balance, decreased endurance, decreased mobility,  difficulty walking, decreased ROM, decreased strength, decreased safety awareness, impaired flexibility, impaired sensation, postural dysfunction, and pain.    ACTIVITY LIMITATIONS carrying, lifting, bending, sitting, standing, squatting, stairs, and transfers   PARTICIPATION LIMITATIONS: cleaning, laundry, driving, shopping, community activity, occupation, and yard work   PERSONAL FACTORS Time since onset of injury/illness/exacerbation and 1-2 comorbidities: schizophrenia and anxiety  are also affecting patient's functional outcome.    REHAB POTENTIAL: Good   CLINICAL DECISION MAKING: Evolving/moderate complexity   EVALUATION COMPLEXITY: Moderate     GOALS: Goals reviewed with patient? Yes   SHORT TERM GOALS: Target date: 01/21/2022   Patient will be independent in home exercise program to improve strength/mobility for better functional independence with ADLs. Baseline: Goal status: INITIAL   2.  Pt will ambulate 200+ feet using RW without LOB or LLE buckling to demonstrate progression towards improved ambulatory endurance.  Baseline: 118ft  Goal status: INITIAL   3.   Patient will deny any falls over past 4 weeks to demonstrate improved safety at home.  Baseline: 4 falls since surgery  Goal status: INITIAL     LONG TERM GOALS: Target date: 03/04/2022   Patient will increase FOTO score to equal to or greater than 41 to demonstrate statistically significant improvement in mobility and quality of life.  Baseline: 01/07/22: 23/41 Goal status:  INITIAL   2.   Patient (< 17 years old) will complete five times sit to stand test in < 10 seconds indicating an increased LE strength and improved balance. Baseline: 01/07/22: 43 seconds Goal status: INITIAL   3.  Patient will increase 10 meter walk test to >1.33m/s as to improve gait speed for better community ambulation and to reduce fall risk. Baseline: 01/07/22: 0.41 m/s Goal status: INITIAL   4.  Patient will increase six minute walk test distance to >1000 for progression to community ambulator and improve gait ability. Baseline: 01/14/22: 280 feet Goal status: INITIAL   5.  Patient will increase LLE gross strength to 4/5 as to improve functional strength for independent gait, increased standing tolerance and increased ADL ability. Baseline: 01/07/22: ranging 3-4/5, please see objective  Goal status: INITIAL         PLAN: PT FREQUENCY: 2x/week   PT DURATION: 8 weeks   PLANNED INTERVENTIONS: Therapeutic exercises, Therapeutic activity, Neuromuscular re-education, Balance training, Gait training, Patient/Family education, Joint mobilization, Stair training, DME instructions, Dry Needling, Cryotherapy, Moist heat, and Manual therapy.   PLAN FOR NEXT SESSION: LE desensitization, global LE strengthening, core/lumbar strengthening, gait training with progressive weightbearing activity and gradual AD wean as able once weightbearing improves. Modalities for pain modulation as needed    Consuela Mimes, PT, DPT #[id number]  Gertie Exon, PT 01/22/2022, 1:15 PM

## 2022-01-22 ENCOUNTER — Encounter: Payer: Self-pay | Admitting: Physical Therapy

## 2022-01-23 ENCOUNTER — Ambulatory Visit: Payer: Medicare Other | Admitting: Physical Therapy

## 2022-01-23 DIAGNOSIS — M6281 Muscle weakness (generalized): Secondary | ICD-10-CM | POA: Diagnosis not present

## 2022-01-23 DIAGNOSIS — R2681 Unsteadiness on feet: Secondary | ICD-10-CM

## 2022-01-23 DIAGNOSIS — Z981 Arthrodesis status: Secondary | ICD-10-CM

## 2022-01-23 DIAGNOSIS — R269 Unspecified abnormalities of gait and mobility: Secondary | ICD-10-CM

## 2022-01-23 DIAGNOSIS — M545 Low back pain, unspecified: Secondary | ICD-10-CM

## 2022-01-23 NOTE — Therapy (Signed)
OUTPATIENT PHYSICAL THERAPY TREATMENT NOTE   Patient Name: Justin Nixon MRN: [id number] DOB:[birth year], 46 y.o., male Today's Date: 01/27/2022  PCP: Gracelyn Nurse, MD REFERRING PROVIDER: Gracelyn Nurse, MD  END OF SESSION:   PT End of Session - 01/27/22 0753     Visit Number 6    Number of Visits 17    Date for PT Re-Evaluation 03/04/22    Progress Note Due on Visit 10    PT Start Time 0900    PT Stop Time 0944    PT Time Calculation (min) 44 min    Equipment Utilized During Treatment Gait belt    Activity Tolerance Patient limited by fatigue;Patient limited by pain    Behavior During Therapy WFL for tasks assessed/performed             Past Medical History:  Diagnosis Date   Anxiety    Arthritis    GERD (gastroesophageal reflux disease)    H/O   Hyperlipidemia    Schizophrenia (HCC)    Past Surgical History:  Procedure Laterality Date   ANTERIOR LATERAL LUMBAR FUSION WITH PERCUTANEOUS SCREW 1 LEVEL N/A 11/18/2021   Procedure: L4-5 LATERAL LUMBAR INTERBODY FUSION WITH POSTERIOR SPINAL FUSION;  Surgeon: Venetia Night, MD;  Location: [facility];  Service: Neurosurgery;  Laterality: N/A;   APPLICATION OF INTRAOPERATIVE CT SCAN N/A 11/18/2021   Procedure: APPLICATION OF INTRAOPERATIVE CT SCAN;  Surgeon: Venetia Night, MD;  Location: [facility];  Service: Neurosurgery;  Laterality: N/A;   COLONOSCOPY     CYST EXCISION     ELBOW ARTHROSCOPY Left 10/10/2020   Procedure: LEFT ELBOW ARTHROSCOPY WITH DEBRIDEMENT AND EXCISION OF LOOSE BODIES;  Surgeon: Christena Flake, MD;  Location: [facility];  Service: Orthopedics;  Laterality: Left;   MYRINGOTOMY WITH TUBE PLACEMENT     ORIF ELBOW FRACTURE Left 08/28/2021   Procedure: OPEN REDUCTION INTERNAL FIXATION (ORIF) ELBOW/OLECRANON FRACTURE;  Surgeon: Christena Flake, MD;  Location: [facility];  Service: Orthopedics;  Laterality: Left;   SHOULDER ARTHROSCOPY Left 03/18/2017   Procedure: ARTHROSCOPY SHOULDER with debridement  decompression and distal clavicle excision;  Surgeon: Christena Flake, MD;  Location: [facility];  Service: Orthopedics;  Laterality: Left;   SHOULDER ARTHROSCOPY WITH OPEN ROTATOR CUFF REPAIR Right 09/23/2016   Procedure: SHOULDER ARTHROSCOPY WITH OPEN ROTATOR CUFF REPAIR;  Surgeon: Christena Flake, MD;  Location: [facility];  Service: Orthopedics;  Laterality: Right;   SHOULDER ARTHROSCOPY WITH SUBACROMIAL DECOMPRESSION Right 09/23/2016   Procedure: SHOULDER ARTHROSCOPY WITH SUBACROMIAL DECOMPRESSION AND DEBRIDEMENT;  Surgeon: Christena Flake, MD;  Location: [facility];  Service: Orthopedics;  Laterality: Right;   Patient Active Problem List   Diagnosis Date Noted   S/P spinal fusion 11/18/2021   Olecranon fracture, left, open type I or II, initial encounter 08/28/2021      REFERRING DIAG: Z98.1 (ICD-10-CM) - History of lumbar fusion   THERAPY DIAG:  Muscle weakness (generalized)   S/P lumbar fusion   Abnormality of gait and mobility   Unsteadiness on feet   Rationale for Evaluation and Treatment Rehabilitation   PERTINENT HISTORY: Pt presents to physical therapy for evaluation of low back and LE pain and weakness L>R, following L4-L5 XLIF/PSF on 11/18/21. Pt states he was using a cane before surgery due to onset of LLE weakness. States he has now been using a RW since surgery. Pain ranges from a 5/10 to a 10/10. When aggravated pain is described as sharp and shooting into LLE; at baseline,  pain is achy. Wearing clothes such as jeans is painful due to sensitivity of skin; at home wears pajamas. Pt has not returned to driving; he is limited to household distance ambulation or maximum into a doctors office. He feels as though his LLE is going to "give out" when he is walking or weightbearing. 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 Reports frequent falling since surgery due to LLE  giving out; no injuries associated with falls.    PRECAUTIONS: Back - no bending, limited twisting, no lifting > 25#     OBJECTIVE: (objective measures completed at initial evaluation unless otherwise dated)   DIAGNOSTIC FINDINGS:  "Intraoperative images during L4-L5 anterior and posterior fusion. No evidence of immediate complication"   PATIENT SURVEYS:  Modified Oswestry (updated 01/09/22 due to incomplete form): 62% FOTO 23/41   SCREENING FOR RED FLAGS: Bowel or bladder incontinence: No Spinal tumors: No Cauda equina syndrome: No Compression fracture: No Abdominal aneurysm: No   COGNITION:           Overall cognitive status: Within functional limits for tasks assessed                          SENSATION: Light touch: Impaired  - increased sensitivity in left L2 and L3 dermatomes    MUSCLE LENGTH: Hamstrings: unable to test due to increase in sharp/shooting pain in hip flexors prior to HS stretch being felt  Thomas test: NT   POSTURE: decreased lumbar lordosis and posterior pelvic tilt   PALPATION: Right: hip flexors and lateral hip, glute med, piriformis, deep external rotators, QL Left: hip flexors, lateral hip and adductors (from proximal hip to mid-thigh), proximal hamstrings, L4-distal paraspinals, QL, all gluteals   OBSERVATION 01/21/22: No bruising, ecchymosis, increased tissue temperature, or erythema observed along L flank, L gluteal region, or adjacent to surgical incisions. No sign of infection or delayed healing   LUMBAR ROM: not assessed due to back precautions    Active  A/PROM  eval  Flexion -  Extension -  Right lateral flexion -  Left lateral flexion -  Right rotation -  Left rotation -   (Blank rows = not tested)   LOWER EXTREMITY ROM:      Passive  Right eval Left eval  Hip flexion 100 90  Hip extension NT NT  Hip abduction 20 20  Hip adduction      Hip internal rotation      Hip external rotation      Knee flexion [facility]  Knee  extension [facility]  Ankle dorsiflexion [facility] WFL  Ankle plantarflexion      Ankle inversion      Ankle eversion       (Blank rows = not tested)   LOWER EXTREMITY MMT:     MMT Right eval Left eval  Hip flexion 4-* 4-*  Hip extension      Hip abduction 4+* 4-*  Hip adduction 4* 4*  Hip internal rotation 5 3*  Hip external rotation 5 4*  Knee flexion 5 4*  Knee extension 5 4+  Ankle dorsiflexion 4+ 3+  Ankle plantarflexion       (Blank rows = not tested)     FUNCTIONAL TESTS:  5 times sit to stand: 43.37 seconds using arms 6 minute walk test (performed 01/14/22): 280 feet 10 meter walk test: 24.3 seconds (comfortable) =  0.41 m/s using RW; terminated fast-pace for safety    STS mechanics: heavy use of triceps, posterior lean with frequent posterior LOB when performed w/o RW.    GAIT: Distance walked: <141ft at eval Assistive device utilized: Environmental consultant - 2 wheeled Level of assistance: CGA Comments: increased reliance on UE support and decreased stance time during stance phase of LLE; bilateral trendelenburg; one LOB during ambulation while using RW due to LLE weakness with controlled descent to chair.        TODAY'S TREATMENT    SUBJECTIVE: Patient reports compliance with home program/desensitization work. He reports one time bending and twisting when reaching to floor and aggravating L flank region. He took Tylenol to mitigate pain. He reports increase in swelling after this.    PAIN:  Are you having pain? Yes: NPRS 5/10  Pain location: Pain in L thigh and knee, L          Cold pack (unbilled) - utilized with e-stim at beginning of session for anti-inflammatory and analgesic effect as needed for reduced pain and improved ability to participate in active PT intervention, along L lateral thigh and L buttock in R sidelying, x 8 minutes  Premodulated E-stim - utilized for pain control to improve active participation in physical therapy;  2 channels - 1 along L3-S1 L paraspinal  and 1 along L thigh from greater trochanter to distal ITB. Continuous cycle time, beat low 80 Hz, beat high 150 Hz, intensity: 18 V. Duration: 8 minutes       Manual Therapy - for symptom modulation, soft tissue sensitivity and mobility, joint mobility, ROM    *Manual therapy not performed today* STM and IASTM with Hypervolt: L quadratus lumborum, L erector spinae L3-5, L gluteal musculature       Therapeutic Exercise - for improved soft tissue flexibility and extensibility as needed for ROM, improved strength as needed to improve performance of CKC activities/functional movements   In hooklying: Piriformis stretch; 20x, 1 sec Abdominal brace with Bridge c neutral spine, 3/4 range; 2x10 Abdominal brace with alternating march; 1x10 alternating R/L   Sit to stand from raised table; maintaining neutral spine; 2x8 (approx 65 deg knee flexion)   In // bars:  Standing toe tap onto Airex pad while standing in front-wheel walker; 2x10 alternating R/L for weight shifting and weight acceptance onto L lower limb   Standing heel raise; 2x10 with bilat support on front-wheeled walker    Patient education: reviewed BLT activity restrictions and discussed no significant integumentary findings along L iliolumbar region.             *not today* Abdominal brace/TrA contraction with alternating adductor ball squeeze/hip ABD iso against belt; 2x10, 5 sec  6-minute walk with front-wheel walker Abdominal brace/TrA contraction; x10, 5 sec Glute set; 2x10, 5 sec         PATIENT EDUCATION:  Education details: Discussed at length with patient desensitization program to utilize at home and initiated HEP with baseline isometric drills with neutral spine. Provided handout for desensitization home program and HEP (Access Code [email or web address]) Person educated: Patient Education method: Explanation Education comprehension: verbalized understanding     HOME EXERCISE PROGRAM: Access Code [email or web address]        ASSESSMENT:   CLINICAL IMPRESSION: Patient has ongoing moderate-level low back and L hip/thigh pain with hypersensitvity along L lateral thigh and L posterolateral gluteal region. Utilized e-stim today to modulate pain and improve participation in active PT given persistent symptoms with ongoing use of desensitization, cryotherapy,  and STM/IASTM. He has participated well with therapy and has been able to notably progress with standing activities performed in office. Patient still has marked LE weakness, especially LLE with intermittent L knee buckling with standing drills. Pt will benefit from skilled physical therapy to address above deficits in order to increase safety with functional activities and allow pt to return to work and home duties.      OBJECTIVE IMPAIRMENTS Abnormal gait, decreased activity tolerance, decreased balance, decreased endurance, decreased mobility, difficulty walking, decreased ROM, decreased strength, decreased safety awareness, impaired flexibility, impaired sensation, postural dysfunction, and pain.    ACTIVITY LIMITATIONS carrying, lifting, bending, sitting, standing, squatting, stairs, and transfers   PARTICIPATION LIMITATIONS: cleaning, laundry, driving, shopping, community activity, occupation, and yard work   PERSONAL FACTORS Time since onset of injury/illness/exacerbation and 1-2 comorbidities: schizophrenia and anxiety  are also affecting patient's functional outcome.    REHAB POTENTIAL: Good   CLINICAL DECISION MAKING: Evolving/moderate complexity   EVALUATION COMPLEXITY: Moderate     GOALS: Goals reviewed with patient? Yes   SHORT TERM GOALS: Target date: 01/21/2022   Patient will be independent in home exercise program to improve strength/mobility for better functional independence with ADLs. Baseline: Goal status: INITIAL   2.  Pt will ambulate 200+ feet using RW without LOB or LLE buckling to demonstrate progression towards improved ambulatory  endurance.  Baseline: 170ft  Goal status: INITIAL   3.   Patient will deny any falls over past 4 weeks to demonstrate improved safety at home.  Baseline: 4 falls since surgery  Goal status: INITIAL     LONG TERM GOALS: Target date: 03/04/2022   Patient will increase FOTO score to equal to or greater than 41 to demonstrate statistically significant improvement in mobility and quality of life.  Baseline: 01/07/22: 23/41 Goal status: INITIAL   2.   Patient (< 77 years old) will complete five times sit to stand test in < 10 seconds indicating an increased LE strength and improved balance. Baseline: 01/07/22: 43 seconds Goal status: INITIAL   3.  Patient will increase 10 meter walk test to >1.64m/s as to improve gait speed for better community ambulation and to reduce fall risk. Baseline: 01/07/22: 0.41 m/s Goal status: INITIAL   4.  Patient will increase six minute walk test distance to >1000 for progression to community ambulator and improve gait ability. Baseline: 01/14/22: 280 feet Goal status: INITIAL   5.  Patient will increase LLE gross strength to 4/5 as to improve functional strength for independent gait, increased standing tolerance and increased ADL ability. Baseline: 01/07/22: ranging 3-4/5, please see objective  Goal status: INITIAL         PLAN: PT FREQUENCY: 2x/week   PT DURATION: 8 weeks   PLANNED INTERVENTIONS: Therapeutic exercises, Therapeutic activity, Neuromuscular re-education, Balance training, Gait training, Patient/Family education, Joint mobilization, Stair training, DME instructions, Dry Needling, Cryotherapy, Moist heat, and Manual therapy.   PLAN FOR NEXT SESSION: LE desensitization, global LE strengthening, core/lumbar strengthening, gait training with progressive weightbearing activity and gradual AD wean as able once weightbearing improves. Modalities for pain modulation as needed    Consuela Mimes, PT, DPT #[id number]  Gertie Exon, PT 01/27/2022, 7:53  AM

## 2022-01-27 ENCOUNTER — Encounter: Payer: Self-pay | Admitting: Physical Therapy

## 2022-01-28 ENCOUNTER — Ambulatory Visit: Payer: Medicare Other | Admitting: Physical Therapy

## 2022-01-28 ENCOUNTER — Encounter: Payer: Self-pay | Admitting: Physical Therapy

## 2022-01-28 DIAGNOSIS — M545 Low back pain, unspecified: Secondary | ICD-10-CM

## 2022-01-28 DIAGNOSIS — R2681 Unsteadiness on feet: Secondary | ICD-10-CM

## 2022-01-28 DIAGNOSIS — M6281 Muscle weakness (generalized): Secondary | ICD-10-CM | POA: Diagnosis not present

## 2022-01-28 DIAGNOSIS — R269 Unspecified abnormalities of gait and mobility: Secondary | ICD-10-CM

## 2022-01-28 DIAGNOSIS — Z981 Arthrodesis status: Secondary | ICD-10-CM

## 2022-01-28 NOTE — Therapy (Signed)
OUTPATIENT PHYSICAL THERAPY TREATMENT NOTE   Patient Name: Justin Nixon MRN: [id number] DOB:1975-08-26, 46 y.o., male Today's Date: 01/28/2022  PCP: Gracelyn Nurse, MD REFERRING PROVIDER: Gracelyn Nurse, MD  END OF SESSION:   PT End of Session - 01/28/22 0912     Visit Number 7    Number of Visits 17    Date for PT Re-Evaluation 03/04/22    Progress Note Due on Visit 10    PT Start Time 0857    PT Stop Time 0940    PT Time Calculation (min) 43 min    Equipment Utilized During Treatment Gait belt    Activity Tolerance Patient limited by fatigue;Patient limited by pain    Behavior During Therapy WFL for tasks assessed/performed              Past Medical History:  Diagnosis Date   Anxiety    Arthritis    GERD (gastroesophageal reflux disease)    H/O   Hyperlipidemia    Schizophrenia (HCC)    Past Surgical History:  Procedure Laterality Date   ANTERIOR LATERAL LUMBAR FUSION WITH PERCUTANEOUS SCREW 1 LEVEL N/A 11/18/2021   Procedure: L4-5 LATERAL LUMBAR INTERBODY FUSION WITH POSTERIOR SPINAL FUSION;  Surgeon: Venetia Night, MD;  Location: [facility];  Service: Neurosurgery;  Laterality: N/A;   APPLICATION OF INTRAOPERATIVE CT SCAN N/A 11/18/2021   Procedure: APPLICATION OF INTRAOPERATIVE CT SCAN;  Surgeon: Venetia Night, MD;  Location: [facility];  Service: Neurosurgery;  Laterality: N/A;   COLONOSCOPY     CYST EXCISION     ELBOW ARTHROSCOPY Left 10/10/2020   Procedure: LEFT ELBOW ARTHROSCOPY WITH DEBRIDEMENT AND EXCISION OF LOOSE BODIES;  Surgeon: Christena Flake, MD;  Location: [facility];  Service: Orthopedics;  Laterality: Left;   MYRINGOTOMY WITH TUBE PLACEMENT     ORIF ELBOW FRACTURE Left 08/28/2021   Procedure: OPEN REDUCTION INTERNAL FIXATION (ORIF) ELBOW/OLECRANON FRACTURE;  Surgeon: Christena Flake, MD;  Location: [facility];  Service: Orthopedics;  Laterality: Left;   SHOULDER ARTHROSCOPY Left 03/18/2017   Procedure: ARTHROSCOPY SHOULDER with debridement  decompression and distal clavicle excision;  Surgeon: Christena Flake, MD;  Location: [facility];  Service: Orthopedics;  Laterality: Left;   SHOULDER ARTHROSCOPY WITH OPEN ROTATOR CUFF REPAIR Right 09/23/2016   Procedure: SHOULDER ARTHROSCOPY WITH OPEN ROTATOR CUFF REPAIR;  Surgeon: Christena Flake, MD;  Location: [facility];  Service: Orthopedics;  Laterality: Right;   SHOULDER ARTHROSCOPY WITH SUBACROMIAL DECOMPRESSION Right 09/23/2016   Procedure: SHOULDER ARTHROSCOPY WITH SUBACROMIAL DECOMPRESSION AND DEBRIDEMENT;  Surgeon: Christena Flake, MD;  Location: [facility];  Service: Orthopedics;  Laterality: Right;   Patient Active Problem List   Diagnosis Date Noted   S/P spinal fusion 11/18/2021   Olecranon fracture, left, open type I or II, initial encounter 08/28/2021     REFERRING DIAG: Z98.1 (ICD-10-CM) - History of lumbar fusion   THERAPY DIAG:  Muscle weakness (generalized)   S/P lumbar fusion   Abnormality of gait and mobility   Unsteadiness on feet   Rationale for Evaluation and Treatment Rehabilitation   PERTINENT HISTORY: Pt presents to physical therapy for evaluation of low back and LE pain and weakness L>R, following L4-L5 XLIF/PSF on 11/18/21. Pt states he was using a cane before surgery due to onset of LLE weakness. States he has now been using a RW since surgery. Pain ranges from a 5/10 to a 10/10. When aggravated pain is described as sharp and shooting into LLE; at baseline,  pain is achy. Wearing clothes such as jeans is painful due to sensitivity of skin; at home wears pajamas. Pt has not returned to driving; he is limited to household distance ambulation or maximum into a doctors office. He feels as though his LLE is going to "give out" when he is walking or weightbearing. 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 Reports frequent falling since surgery due to LLE  giving out; no injuries associated with falls.    PRECAUTIONS: Back - no bending, limited twisting, no lifting > 25#     OBJECTIVE: (objective measures completed at initial evaluation unless otherwise dated)   DIAGNOSTIC FINDINGS:  "Intraoperative images during L4-L5 anterior and posterior fusion. No evidence of immediate complication"   PATIENT SURVEYS:  Modified Oswestry (updated 01/09/22 due to incomplete form): 62% FOTO 23/41   SCREENING FOR RED FLAGS: Bowel or bladder incontinence: No Spinal tumors: No Cauda equina syndrome: No Compression fracture: No Abdominal aneurysm: No   COGNITION:           Overall cognitive status: Within functional limits for tasks assessed                          SENSATION: Light touch: Impaired  - increased sensitivity in left L2 and L3 dermatomes    MUSCLE LENGTH: Hamstrings: unable to test due to increase in sharp/shooting pain in hip flexors prior to HS stretch being felt.  (01/28/22): R lacking 30 deg, L lacking 40 deg.   Thomas test: NT   POSTURE: decreased lumbar lordosis and posterior pelvic tilt   PALPATION: Right: hip flexors and lateral hip, glute med, piriformis, deep external rotators, QL Left: hip flexors, lateral hip and adductors (from proximal hip to mid-thigh), proximal hamstrings, L4-distal paraspinals, QL, all gluteals   OBSERVATION 01/21/22: No bruising, ecchymosis, increased tissue temperature, or erythema observed along L flank, L gluteal region, or adjacent to surgical incisions. No sign of infection or delayed healing   LUMBAR ROM: not assessed due to back precautions    Active  A/PROM  eval  Flexion -  Extension -  Right lateral flexion -  Left lateral flexion -  Right rotation -  Left rotation -   (Blank rows = not tested)   LOWER EXTREMITY ROM:      Passive  Right eval Left eval  Hip flexion 100 90  Hip extension NT NT  Hip abduction 20 20  Hip adduction      Hip internal rotation      Hip external  rotation      Knee flexion [facility] WFL  Knee extension [facility]  Ankle dorsiflexion [facility] WFL  Ankle plantarflexion      Ankle inversion      Ankle eversion       (Blank rows = not tested)   LOWER EXTREMITY MMT:     MMT Right eval Left eval  Hip flexion 4-* 4-*  Hip extension      Hip abduction 4+* 4-*  Hip adduction 4* 4*  Hip internal rotation 5 3*  Hip external rotation 5 4*  Knee flexion 5 4*  Knee extension 5 4+  Ankle dorsiflexion 4+ 3+  Ankle plantarflexion       (Blank rows = not tested)     FUNCTIONAL TESTS:  5 times sit to stand: 43.37 seconds using arms 6 minute walk test (performed  01/14/22): 280 feet 10 meter walk test: 24.3 seconds (comfortable) = 0.41 m/s using RW; terminated fast-pace for safety    STS mechanics: heavy use of triceps, posterior lean with frequent posterior LOB when performed w/o RW.    GAIT: Distance walked: <138ft at eval Assistive device utilized: Environmental consultant - 2 wheeled Level of assistance: CGA Comments: increased reliance on UE support and decreased stance time during stance phase of LLE; bilateral trendelenburg; one LOB during ambulation while using RW due to LLE weakness with controlled descent to chair.        TODAY'S TREATMENT    SUBJECTIVE: Patient reports HEP and desensitization is going well. He reports using microfiber cloth and massage gun along L lateral thigh and doing okay with these textures along L hip/thigh after desensitization work. Patient reports having cousin pass away over the weekend and this has notably affected him emotionally over last few days.    PAIN:  Are you having pain? Yes: NPRS 4/10  Pain location: Pain in L thigh and knee, L          Cold pack (unbilled) - utilized with e-stim at beginning of session for anti-inflammatory and analgesic effect as needed for reduced pain and improved ability to participate in active PT intervention, along L lateral thigh and L buttock in R sidelying, x 8 minutes    Premodulated E-stim - utilized for pain control to improve active participation in physical therapy;  2 channels - 1 along L3-S1 L paraspinal and 1 along L thigh from greater trochanter to distal ITB. Continuous cycle time, beat low 80 Hz, beat high 150 Hz, intensity: 24 V. Duration: 8 minutes        Manual Therapy - for symptom modulation, soft tissue sensitivity and mobility, joint mobility, ROM    *Manual therapy not performed today* STM and IASTM with Hypervolt: L quadratus lumborum, L erector spinae L3-5, L gluteal musculature       Therapeutic Exercise - for improved soft tissue flexibility and extensibility as needed for ROM, improved strength as needed to improve performance of CKC activities/functional movements   In hooklying: Active hamstring stretch (glider technique): 2x10 on each LE  Abdominal brace with Bridge c neutral spine,  full range;  Abdominal brace with alternating march; 2x10 alternating R/L   Sit to stand from raised table; maintaining neutral spine; 2x6 (approx 70 deg knee flexion)   In // bars:  Standing toe tap onto Airex pad while standing in front-wheel walker; 2x10 alternating R/L for weight shifting and weight acceptance onto L lower limb   Standing heel raise; 2x10 with bilat support on front-wheeled walker  Sidestep with minimal upper limb support, careful guarding with intermittent LLE buckling; x3 D/B length of bars                *not today* Piriformis stretch; 20x, 1 sec Abdominal brace/TrA contraction with alternating adductor ball squeeze/hip ABD iso against belt; 2x10, 5 sec  6-minute walk with front-wheel walker Abdominal brace/TrA contraction; x10, 5 sec Glute set; 2x10, 5 sec          HOME EXERCISE PROGRAM: Access Code [email or web address]       ASSESSMENT:   CLINICAL IMPRESSION: Patient has ongoing 4-5/10 pain s/p L4-L5 XLIF/PSF on 11/18/21. He is continuing with desensitization program that is largely home-based and is  continuing with isometric core strengthening and hip isotonics and gradual exposure to standing drills. Patient needs further work on weight acceptance on LLE and strengthening to enable safe  wean from use of walker. Patient still has marked LE weakness, especially LLE with intermittent L knee buckling with standing drills. Pt will benefit from skilled physical therapy to address above deficits in order to increase safety with functional activities and allow pt to return to work and home duties.      OBJECTIVE IMPAIRMENTS Abnormal gait, decreased activity tolerance, decreased balance, decreased endurance, decreased mobility, difficulty walking, decreased ROM, decreased strength, decreased safety awareness, impaired flexibility, impaired sensation, postural dysfunction, and pain.    ACTIVITY LIMITATIONS carrying, lifting, bending, sitting, standing, squatting, stairs, and transfers   PARTICIPATION LIMITATIONS: cleaning, laundry, driving, shopping, community activity, occupation, and yard work   PERSONAL FACTORS Time since onset of injury/illness/exacerbation and 1-2 comorbidities: schizophrenia and anxiety  are also affecting patient's functional outcome.    REHAB POTENTIAL: Good   CLINICAL DECISION MAKING: Evolving/moderate complexity   EVALUATION COMPLEXITY: Moderate     GOALS: Goals reviewed with patient? Yes   SHORT TERM GOALS: Target date: 01/21/2022   Patient will be independent in home exercise program to improve strength/mobility for better functional independence with ADLs. Baseline: Goal status: INITIAL   2.  Pt will ambulate 200+ feet using RW without LOB or LLE buckling to demonstrate progression towards improved ambulatory endurance.  Baseline: 149ft  Goal status: INITIAL   3.   Patient will deny any falls over past 4 weeks to demonstrate improved safety at home.  Baseline: 4 falls since surgery  Goal status: INITIAL     LONG TERM GOALS: Target date: 03/04/2022    Patient will increase FOTO score to equal to or greater than 41 to demonstrate statistically significant improvement in mobility and quality of life.  Baseline: 01/07/22: 23/41 Goal status: INITIAL   2.   Patient (< 8 years old) will complete five times sit to stand test in < 10 seconds indicating an increased LE strength and improved balance. Baseline: 01/07/22: 43 seconds Goal status: INITIAL   3.  Patient will increase 10 meter walk test to >1.37m/s as to improve gait speed for better community ambulation and to reduce fall risk. Baseline: 01/07/22: 0.41 m/s Goal status: INITIAL   4.  Patient will increase six minute walk test distance to >1000 for progression to community ambulator and improve gait ability. Baseline: 01/14/22: 280 feet Goal status: INITIAL   5.  Patient will increase LLE gross strength to 4/5 as to improve functional strength for independent gait, increased standing tolerance and increased ADL ability. Baseline: 01/07/22: ranging 3-4/5, please see objective  Goal status: INITIAL         PLAN: PT FREQUENCY: 2x/week   PT DURATION: 8 weeks   PLANNED INTERVENTIONS: Therapeutic exercises, Therapeutic activity, Neuromuscular re-education, Balance training, Gait training, Patient/Family education, Joint mobilization, Stair training, DME instructions, Dry Needling, Cryotherapy, Moist heat, and Manual therapy.   PLAN FOR NEXT SESSION: LE desensitization, global LE strengthening, core/lumbar strengthening, gait training with progressive weightbearing activity and gradual AD wean as able once weightbearing improves. Modalities for pain modulation as needed     Consuela Mimes, PT, DPT #[id number]  Gertie Exon, PT 01/28/2022, 9:25 AM

## 2022-01-30 ENCOUNTER — Ambulatory Visit: Payer: Medicare Other | Admitting: Physical Therapy

## 2022-01-30 ENCOUNTER — Encounter: Payer: Self-pay | Admitting: Physical Therapy

## 2022-01-30 DIAGNOSIS — M6281 Muscle weakness (generalized): Secondary | ICD-10-CM

## 2022-01-30 DIAGNOSIS — M545 Low back pain, unspecified: Secondary | ICD-10-CM

## 2022-01-30 DIAGNOSIS — Z981 Arthrodesis status: Secondary | ICD-10-CM

## 2022-01-30 DIAGNOSIS — R2681 Unsteadiness on feet: Secondary | ICD-10-CM

## 2022-01-30 DIAGNOSIS — R269 Unspecified abnormalities of gait and mobility: Secondary | ICD-10-CM

## 2022-01-30 NOTE — Therapy (Signed)
OUTPATIENT PHYSICAL THERAPY TREATMENT NOTE   Patient Name: Justin Nixon MRN: [id number] DOB:17-Mar-1976, 46 y.o., male Today's Date: 01/30/2022  PCP: Gracelyn Nurse, MD REFERRING PROVIDER: Gracelyn Nurse, MD  END OF SESSION:   PT End of Session - 01/30/22 0854     Visit Number 8    Number of Visits 17    Date for PT Re-Evaluation 03/04/22    Progress Note Due on Visit 10    PT Start Time 0854    PT Stop Time 0938    PT Time Calculation (min) 44 min    Equipment Utilized During Treatment Gait belt    Activity Tolerance Patient limited by fatigue;Patient limited by pain    Behavior During Therapy WFL for tasks assessed/performed             Past Medical History:  Diagnosis Date   Anxiety    Arthritis    GERD (gastroesophageal reflux disease)    H/O   Hyperlipidemia    Schizophrenia (HCC)    Past Surgical History:  Procedure Laterality Date   ANTERIOR LATERAL LUMBAR FUSION WITH PERCUTANEOUS SCREW 1 LEVEL N/A 11/18/2021   Procedure: L4-5 LATERAL LUMBAR INTERBODY FUSION WITH POSTERIOR SPINAL FUSION;  Surgeon: Venetia Night, MD;  Location: [facility];  Service: Neurosurgery;  Laterality: N/A;   APPLICATION OF INTRAOPERATIVE CT SCAN N/A 11/18/2021   Procedure: APPLICATION OF INTRAOPERATIVE CT SCAN;  Surgeon: Venetia Night, MD;  Location: [facility];  Service: Neurosurgery;  Laterality: N/A;   COLONOSCOPY     CYST EXCISION     ELBOW ARTHROSCOPY Left 10/10/2020   Procedure: LEFT ELBOW ARTHROSCOPY WITH DEBRIDEMENT AND EXCISION OF LOOSE BODIES;  Surgeon: Christena Flake, MD;  Location: [facility];  Service: Orthopedics;  Laterality: Left;   MYRINGOTOMY WITH TUBE PLACEMENT     ORIF ELBOW FRACTURE Left 08/28/2021   Procedure: OPEN REDUCTION INTERNAL FIXATION (ORIF) ELBOW/OLECRANON FRACTURE;  Surgeon: Christena Flake, MD;  Location: [facility];  Service: Orthopedics;  Laterality: Left;   SHOULDER ARTHROSCOPY Left 03/18/2017   Procedure: ARTHROSCOPY SHOULDER with debridement  decompression and distal clavicle excision;  Surgeon: Christena Flake, MD;  Location: [facility];  Service: Orthopedics;  Laterality: Left;   SHOULDER ARTHROSCOPY WITH OPEN ROTATOR CUFF REPAIR Right 09/23/2016   Procedure: SHOULDER ARTHROSCOPY WITH OPEN ROTATOR CUFF REPAIR;  Surgeon: Christena Flake, MD;  Location: [facility];  Service: Orthopedics;  Laterality: Right;   SHOULDER ARTHROSCOPY WITH SUBACROMIAL DECOMPRESSION Right 09/23/2016   Procedure: SHOULDER ARTHROSCOPY WITH SUBACROMIAL DECOMPRESSION AND DEBRIDEMENT;  Surgeon: Christena Flake, MD;  Location: [facility];  Service: Orthopedics;  Laterality: Right;   Patient Active Problem List   Diagnosis Date Noted   S/P spinal fusion 11/18/2021   Olecranon fracture, left, open type I or II, initial encounter 08/28/2021      REFERRING DIAG: Z98.1 (ICD-10-CM) - History of lumbar fusion   THERAPY DIAG:  Muscle weakness (generalized)   S/P lumbar fusion   Abnormality of gait and mobility   Unsteadiness on feet   Rationale for Evaluation and Treatment Rehabilitation   PERTINENT HISTORY: Pt presents to physical therapy for evaluation of low back and LE pain and weakness L>R, following L4-L5 XLIF/PSF on 11/18/21. Pt states he was using a cane before surgery due to onset of LLE weakness. States he has now been using a RW since surgery. Pain ranges from a 5/10 to a 10/10. When aggravated pain is described as sharp and shooting into LLE; at baseline,  pain is achy. Wearing clothes such as jeans is painful due to sensitivity of skin; at home wears pajamas. Pt has not returned to driving; he is limited to household distance ambulation or maximum into a doctors office. He feels as though his LLE is going to "give out" when he is walking or weightbearing. 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 Reports frequent falling since surgery due to LLE  giving out; no injuries associated with falls.    PRECAUTIONS: Back - no bending, limited twisting, no lifting > 25#     OBJECTIVE: (objective measures completed at initial evaluation unless otherwise dated)   DIAGNOSTIC FINDINGS:  "Intraoperative images during L4-L5 anterior and posterior fusion. No evidence of immediate complication"   PATIENT SURVEYS:  Modified Oswestry (updated 01/09/22 due to incomplete form): 62% FOTO 23/41   SCREENING FOR RED FLAGS: Bowel or bladder incontinence: No Spinal tumors: No Cauda equina syndrome: No Compression fracture: No Abdominal aneurysm: No   COGNITION:           Overall cognitive status: Within functional limits for tasks assessed                          SENSATION: Light touch: Impaired  - increased sensitivity in left L2 and L3 dermatomes    MUSCLE LENGTH: Hamstrings: unable to test due to increase in sharp/shooting pain in hip flexors prior to HS stretch being felt.  (01/28/22): R lacking 30 deg, L lacking 40 deg.   Thomas test: NT   POSTURE: decreased lumbar lordosis and posterior pelvic tilt   PALPATION: Right: hip flexors and lateral hip, glute med, piriformis, deep external rotators, QL Left: hip flexors, lateral hip and adductors (from proximal hip to mid-thigh), proximal hamstrings, L4-distal paraspinals, QL, all gluteals   OBSERVATION 01/21/22: No bruising, ecchymosis, increased tissue temperature, or erythema observed along L flank, L gluteal region, or adjacent to surgical incisions. No sign of infection or delayed healing   LUMBAR ROM: not assessed due to back precautions    Active  A/PROM  eval  Flexion -  Extension -  Right lateral flexion -  Left lateral flexion -  Right rotation -  Left rotation -   (Blank rows = not tested)   LOWER EXTREMITY ROM:      Passive  Right eval Left eval  Hip flexion 100 90  Hip extension NT NT  Hip abduction 20 20  Hip adduction      Hip internal rotation      Hip external  rotation      Knee flexion [facility] WFL  Knee extension [facility]  Ankle dorsiflexion [facility] WFL  Ankle plantarflexion      Ankle inversion      Ankle eversion       (Blank rows = not tested)   LOWER EXTREMITY MMT:     MMT Right eval Left eval  Hip flexion 4-* 4-*  Hip extension      Hip abduction 4+* 4-*  Hip adduction 4* 4*  Hip internal rotation 5 3*  Hip external rotation 5 4*  Knee flexion 5 4*  Knee extension 5 4+  Ankle dorsiflexion 4+ 3+  Ankle plantarflexion       (Blank rows = not tested)     FUNCTIONAL TESTS:  5 times sit to stand: 43.37 seconds using arms 6 minute walk test (performed  01/14/22): 280 feet 10 meter walk test: 24.3 seconds (comfortable) = 0.41 m/s using RW; terminated fast-pace for safety    STS mechanics: heavy use of triceps, posterior lean with frequent posterior LOB when performed w/o RW.    GAIT: Distance walked: <132ft at eval Assistive device utilized: Environmental consultant - 2 wheeled Level of assistance: CGA Comments: increased reliance on UE support and decreased stance time during stance phase of LLE; bilateral trendelenburg; one LOB during ambulation while using RW due to LLE weakness with controlled descent to chair.        TODAY'S TREATMENT    SUBJECTIVE: Patient reports continued compliance with his HEP and using more-coarse textures with his desensitization work. Pt reports pain is never below 4/10 and he reports no significant flare-ups on upper end of pain rating scale. Pt is still using front-wheeled walker for primary means of functional mobility and has intermittent LLE buckling during weightbearing.    PAIN:  Are you having pain? Yes: NPRS 4/10  Pain location: Pain in L thigh and knee, L          Cold pack (unbilled) - utilized with e-stim at beginning of session for anti-inflammatory and analgesic effect as needed for reduced pain and improved ability to participate in active PT intervention, along L lateral thigh and L buttock in R  sidelying, x 8 minutes   Premodulated E-stim - (in R sidelying) utilized for pain control to improve active participation in physical therapy;  2 channels - 1 along L3-S1 L paraspinal and 1 along L lateral thigh from greater trochanter to distal ITB. Continuous cycle time, beat low 80 Hz, beat high 150 Hz, intensity: 24 V. Duration: 8 minutes        Manual Therapy - for symptom modulation, soft tissue sensitivity and mobility, joint mobility, ROM    *Manual therapy not performed today* STM and IASTM with Hypervolt: L quadratus lumborum, L erector spinae L3-5, L gluteal musculature       Therapeutic Exercise - for improved soft tissue flexibility and extensibility as needed for ROM, improved strength as needed to improve performance of CKC activities/functional movements   In hooklying: Active hamstring stretch (glider technique): 2x10 on each LE   Abdominal brace with Bridge c neutral spine,  full range; 2x10 Abdominal brace with alternating march and shoulder flexion; 2x10 alternating R/L  Edge of low mat, seated: Seated hip abduction with Grn Tband, with abdominal brace and upright posture/neutral spine; 2x10 Sit to stand from raised table; maintaining neutral spine; 2x8 (approx 70 deg knee flexion)    In // bars:  Standing toe tap onto 6-inch step; 2x10 alternating R/L for weight shifting and weight acceptance onto L lower limb Sidestep with minimal upper limb support, careful guarding with intermittent LLE buckling; x3 D/B length of bars   Forward/backward stepping with therapist guarding patient posteriorly in case of LLE buckling, x 3 D/B length of bars        *not today* Standing heel raise; 2x10 with bilat support on front-wheeled walker Piriformis stretch; 20x, 1 sec Abdominal brace/TrA contraction with alternating adductor ball squeeze/hip ABD iso against belt; 2x10, 5 sec  6-minute walk with front-wheel walker Abdominal brace/TrA contraction; x10, 5 sec Glute set;  2x10, 5 sec           HOME EXERCISE PROGRAM: Access Code [email or web address]       ASSESSMENT:   CLINICAL IMPRESSION: Patient does have consistent 4-5/10 pain level post-operatively at this time and is continuing with desensitization  program for allodynia along L lateral thigh/L lateral hip; he is tolerating slightly more coarse textures along this region at home.  He has been able to increase volume of weightbearing activity over the previous week and demonstrates improved ability to perform sit to stand (though this is still markedly limited and requires careful guarding). Intermittent LLE buckling during stepping drills in bars with pt re-gaining his balance independently. Pt will benefit from skilled physical therapy to address above deficits in order to increase safety with functional activities and allow pt to return to work and home duties.      OBJECTIVE IMPAIRMENTS Abnormal gait, decreased activity tolerance, decreased balance, decreased endurance, decreased mobility, difficulty walking, decreased ROM, decreased strength, decreased safety awareness, impaired flexibility, impaired sensation, postural dysfunction, and pain.    ACTIVITY LIMITATIONS carrying, lifting, bending, sitting, standing, squatting, stairs, and transfers   PARTICIPATION LIMITATIONS: cleaning, laundry, driving, shopping, community activity, occupation, and yard work   PERSONAL FACTORS Time since onset of injury/illness/exacerbation and 1-2 comorbidities: schizophrenia and anxiety  are also affecting patient's functional outcome.    REHAB POTENTIAL: Good   CLINICAL DECISION MAKING: Evolving/moderate complexity   EVALUATION COMPLEXITY: Moderate     GOALS: Goals reviewed with patient? Yes   SHORT TERM GOALS: Target date: 01/21/2022   Patient will be independent in home exercise program to improve strength/mobility for better functional independence with ADLs. Baseline: Goal status: INITIAL   2.  Pt will ambulate  200+ feet using RW without LOB or LLE buckling to demonstrate progression towards improved ambulatory endurance.  Baseline: 151ft  Goal status: INITIAL   3.   Patient will deny any falls over past 4 weeks to demonstrate improved safety at home.  Baseline: 4 falls since surgery  Goal status: INITIAL     LONG TERM GOALS: Target date: 03/04/2022   Patient will increase FOTO score to equal to or greater than 41 to demonstrate statistically significant improvement in mobility and quality of life.  Baseline: 01/07/22: 23/41 Goal status: INITIAL   2.   Patient (< 46 years old) will complete five times sit to stand test in < 10 seconds indicating an increased LE strength and improved balance. Baseline: 01/07/22: 43 seconds Goal status: INITIAL   3.  Patient will increase 10 meter walk test to >1.60m/s as to improve gait speed for better community ambulation and to reduce fall risk. Baseline: 01/07/22: 0.41 m/s Goal status: INITIAL   4.  Patient will increase six minute walk test distance to >1000 for progression to community ambulator and improve gait ability. Baseline: 01/14/22: 280 feet Goal status: INITIAL   5.  Patient will increase LLE gross strength to 4/5 as to improve functional strength for independent gait, increased standing tolerance and increased ADL ability. Baseline: 01/07/22: ranging 3-4/5, please see objective  Goal status: INITIAL         PLAN: PT FREQUENCY: 2x/week   PT DURATION: 8 weeks   PLANNED INTERVENTIONS: Therapeutic exercises, Therapeutic activity, Neuromuscular re-education, Balance training, Gait training, Patient/Family education, Joint mobilization, Stair training, DME instructions, Dry Needling, Cryotherapy, Moist heat, and Manual therapy.   PLAN FOR NEXT SESSION: LE desensitization, global LE strengthening, core/lumbar strengthening, gait training with progressive weightbearing activity and gradual AD wean as able once weightbearing improves. Modalities for pain  modulation as needed      Consuela Mimes, PT, DPT #[id number]  Gertie Exon, PT 01/30/2022, 8:57 AM

## 2022-02-06 ENCOUNTER — Ambulatory Visit: Payer: Medicare Other | Attending: Neurosurgery | Admitting: Physical Therapy

## 2022-02-06 DIAGNOSIS — M6281 Muscle weakness (generalized): Secondary | ICD-10-CM | POA: Diagnosis present

## 2022-02-06 DIAGNOSIS — Z981 Arthrodesis status: Secondary | ICD-10-CM | POA: Insufficient documentation

## 2022-02-06 DIAGNOSIS — R269 Unspecified abnormalities of gait and mobility: Secondary | ICD-10-CM | POA: Diagnosis present

## 2022-02-06 DIAGNOSIS — R2681 Unsteadiness on feet: Secondary | ICD-10-CM | POA: Insufficient documentation

## 2022-02-06 DIAGNOSIS — M545 Low back pain, unspecified: Secondary | ICD-10-CM | POA: Diagnosis present

## 2022-02-06 NOTE — Therapy (Signed)
OUTPATIENT PHYSICAL THERAPY TREATMENT NOTE   Patient Name: Justin Nixon MRN: [id number] DOB:Jun 26, 1976, 46 y.o., male Today's Date: 02/09/2022  PCP: Gracelyn Nurse, MD REFERRING PROVIDER: Venetia Night, MD  END OF SESSION:   PT End of Session - 02/09/22 2159     Visit Number 9    Number of Visits 17    Date for PT Re-Evaluation 03/04/22    Progress Note Due on Visit 10    PT Start Time 0911    PT Stop Time 0951    PT Time Calculation (min) 40 min    Equipment Utilized During Treatment Gait belt    Activity Tolerance Patient limited by fatigue;Patient limited by pain    Behavior During Therapy WFL for tasks assessed/performed             Past Medical History:  Diagnosis Date   Anxiety    Arthritis    GERD (gastroesophageal reflux disease)    H/O   Hyperlipidemia    Schizophrenia (HCC)    Past Surgical History:  Procedure Laterality Date   ANTERIOR LATERAL LUMBAR FUSION WITH PERCUTANEOUS SCREW 1 LEVEL N/A 11/18/2021   Procedure: L4-5 LATERAL LUMBAR INTERBODY FUSION WITH POSTERIOR SPINAL FUSION;  Surgeon: Venetia Night, MD;  Location: [facility];  Service: Neurosurgery;  Laterality: N/A;   APPLICATION OF INTRAOPERATIVE CT SCAN N/A 11/18/2021   Procedure: APPLICATION OF INTRAOPERATIVE CT SCAN;  Surgeon: Venetia Night, MD;  Location: [facility];  Service: Neurosurgery;  Laterality: N/A;   COLONOSCOPY     CYST EXCISION     ELBOW ARTHROSCOPY Left 10/10/2020   Procedure: LEFT ELBOW ARTHROSCOPY WITH DEBRIDEMENT AND EXCISION OF LOOSE BODIES;  Surgeon: Christena Flake, MD;  Location: [facility];  Service: Orthopedics;  Laterality: Left;   MYRINGOTOMY WITH TUBE PLACEMENT     ORIF ELBOW FRACTURE Left 08/28/2021   Procedure: OPEN REDUCTION INTERNAL FIXATION (ORIF) ELBOW/OLECRANON FRACTURE;  Surgeon: Christena Flake, MD;  Location: [facility];  Service: Orthopedics;  Laterality: Left;   SHOULDER ARTHROSCOPY Left 03/18/2017   Procedure: ARTHROSCOPY SHOULDER with debridement  decompression and distal clavicle excision;  Surgeon: Christena Flake, MD;  Location: [facility];  Service: Orthopedics;  Laterality: Left;   SHOULDER ARTHROSCOPY WITH OPEN ROTATOR CUFF REPAIR Right 09/23/2016   Procedure: SHOULDER ARTHROSCOPY WITH OPEN ROTATOR CUFF REPAIR;  Surgeon: Christena Flake, MD;  Location: [facility];  Service: Orthopedics;  Laterality: Right;   SHOULDER ARTHROSCOPY WITH SUBACROMIAL DECOMPRESSION Right 09/23/2016   Procedure: SHOULDER ARTHROSCOPY WITH SUBACROMIAL DECOMPRESSION AND DEBRIDEMENT;  Surgeon: Christena Flake, MD;  Location: [facility];  Service: Orthopedics;  Laterality: Right;   Patient Active Problem List   Diagnosis Date Noted   S/P spinal fusion 11/18/2021   Olecranon fracture, left, open type I or II, initial encounter 08/28/2021      REFERRING DIAG: Z98.1 (ICD-10-CM) - History of lumbar fusion   THERAPY DIAG:  Muscle weakness (generalized)   S/P lumbar fusion   Abnormality of gait and mobility   Unsteadiness on feet   Rationale for Evaluation and Treatment Rehabilitation   PERTINENT HISTORY: Pt presents to physical therapy for evaluation of low back and LE pain and weakness L>R, following L4-L5 XLIF/PSF on 11/18/21. Pt states he was using a cane before surgery due to onset of LLE weakness. States he has now been using a RW since surgery. Pain ranges from a 5/10 to a 10/10. When aggravated pain is described as sharp and shooting into LLE; at baseline, pain  is achy. Wearing clothes such as jeans is painful due to sensitivity of skin; at home wears pajamas. Pt has not returned to driving; he is limited to household distance ambulation or maximum into a doctors office. He feels as though his LLE is going to "give out" when he is walking or weightbearing. 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 Reports frequent falling since surgery due to LLE  giving out; no injuries associated with falls.    PRECAUTIONS: Back - no bending, limited twisting, no lifting > 25#     OBJECTIVE: (objective measures completed at initial evaluation unless otherwise dated)   DIAGNOSTIC FINDINGS:  "Intraoperative images during L4-L5 anterior and posterior fusion. No evidence of immediate complication"   PATIENT SURVEYS:  Modified Oswestry (updated 01/09/22 due to incomplete form): 62% FOTO 23/41   SCREENING FOR RED FLAGS: Bowel or bladder incontinence: No Spinal tumors: No Cauda equina syndrome: No Compression fracture: No Abdominal aneurysm: No   COGNITION:           Overall cognitive status: Within functional limits for tasks assessed                          SENSATION: Light touch: Impaired  - increased sensitivity in left L2 and L3 dermatomes    MUSCLE LENGTH: Hamstrings: unable to test due to increase in sharp/shooting pain in hip flexors prior to HS stretch being felt.  (01/28/22): R lacking 30 deg, L lacking 40 deg.   Thomas test: NT   POSTURE: decreased lumbar lordosis and posterior pelvic tilt   PALPATION: Right: hip flexors and lateral hip, glute med, piriformis, deep external rotators, QL Left: hip flexors, lateral hip and adductors (from proximal hip to mid-thigh), proximal hamstrings, L4-distal paraspinals, QL, all gluteals   OBSERVATION 01/21/22: No bruising, ecchymosis, increased tissue temperature, or erythema observed along L flank, L gluteal region, or adjacent to surgical incisions. No sign of infection or delayed healing   LUMBAR ROM: not assessed due to back precautions    Active  A/PROM  eval  Flexion -  Extension -  Right lateral flexion -  Left lateral flexion -  Right rotation -  Left rotation -   (Blank rows = not tested)   LOWER EXTREMITY ROM:      Passive  Right eval Left eval  Hip flexion 100 90  Hip extension NT NT  Hip abduction 20 20  Hip adduction      Hip internal rotation      Hip external  rotation      Knee flexion [facility]  Knee extension [facility]  Ankle dorsiflexion [facility] WFL  Ankle plantarflexion      Ankle inversion      Ankle eversion       (Blank rows = not tested)   LOWER EXTREMITY MMT:     MMT Right eval Left eval  Hip flexion 4-* 4-*  Hip extension      Hip abduction 4+* 4-*  Hip adduction 4* 4*  Hip internal rotation 5 3*  Hip external rotation 5 4*  Knee flexion 5 4*  Knee extension 5 4+  Ankle dorsiflexion 4+ 3+  Ankle plantarflexion       (Blank rows = not tested)     FUNCTIONAL TESTS:  5 times sit to stand: 43.37 seconds using arms 6 minute walk test (performed 01/14/22):  280 feet 10 meter walk test: 24.3 seconds (comfortable) = 0.41 m/s using RW; terminated fast-pace for safety    STS mechanics: heavy use of triceps, posterior lean with frequent posterior LOB when performed w/o RW.    GAIT: Distance walked: <164ft at eval Assistive device utilized: Environmental consultant - 2 wheeled Level of assistance: CGA Comments: increased reliance on UE support and decreased stance time during stance phase of LLE; bilateral trendelenburg; one LOB during ambulation while using RW due to LLE weakness with controlled descent to chair.        TODAY'S TREATMENT    SUBJECTIVE: Patient reports  sensitivity along L hip/thigh is improving, but slowly. Patient reports 3/10 pain at arrival. Patient reports ongoing LLE buckling that he noted with stair use. Pt reports no recent falls.    PAIN:  Are you having pain? No  Pain location: Pain in L thigh and knee, L         Manual Therapy - for symptom modulation, soft tissue sensitivity and mobility, joint mobility, ROM    *Manual therapy not performed today* STM and IASTM with Hypervolt: L quadratus lumborum, L erector spinae L3-5, L gluteal musculature       Therapeutic Exercise - for improved soft tissue flexibility and extensibility as needed for ROM, improved strength as needed to improve performance of CKC  activities/functional movements   In hooklying: Active hamstring stretch (glider technique): 2x10 on each LE Piriformis stretch; 20x, 1 sec   Abdominal brace with Bridge c neutral spine,  full range; 2x10 Abdominal brace with alternating march and shoulder flexion; 2x10 alternating R/L   Edge of low mat, seated: Seated hip abduction with Grn Tband, with abdominal brace and upright posture/neutral spine; 2x10 Sit to stand from raised table; maintaining neutral spine; 2x10 (approx 86 deg knee flexion)     In // bars:  Sidestep with minimal upper limb support, careful guarding with intermittent LLE buckling; x4 D/B length of bars              Forward/backward stepping with therapist guarding patient posteriorly in case of LLE buckling, x 4 D/B length of bars  Standing march with bilateral light touch on single parallel bar for complete weight shift to each lower limb; x10 alternating   PATIENT EDUCATION: HEP update and review       *not today* Standing toe tap onto 6-inch step; 2x10 alternating R/L for weight shifting and weight acceptance onto L lower limb Standing heel raise; 2x10 with bilat support on front-wheeled walker Abdominal brace/TrA contraction with alternating adductor ball squeeze/hip ABD iso against belt; 2x10, 5 sec  6-minute walk with front-wheel walker Abdominal brace/TrA contraction; x10, 5 sec Glute set; 2x10, 5 sec           HOME EXERCISE PROGRAM: Access Code [email or web address]       ASSESSMENT:   CLINICAL IMPRESSION: Patient is able to perform sit to stand at higher volume from lower sitting surface, indicative of improving functional LE strength needed for performing transferring. Pt has relatively stable pain at this time post-operatively and he feels that he has been improving slowly to date and feels that he is doing well with desensitization program at home. Emphasized active intervention today and progressive weight-bearing work versus manual therapy and use  of passive modalities to work toward improved pt function c completion of daily weightbearing tasks. Pt will benefit from skilled physical therapy to address above deficits in order to increase safety with functional activities and allow pt  to return to work and home duties.      OBJECTIVE IMPAIRMENTS Abnormal gait, decreased activity tolerance, decreased balance, decreased endurance, decreased mobility, difficulty walking, decreased ROM, decreased strength, decreased safety awareness, impaired flexibility, impaired sensation, postural dysfunction, and pain.    ACTIVITY LIMITATIONS carrying, lifting, bending, sitting, standing, squatting, stairs, and transfers   PARTICIPATION LIMITATIONS: cleaning, laundry, driving, shopping, community activity, occupation, and yard work   PERSONAL FACTORS Time since onset of injury/illness/exacerbation and 1-2 comorbidities: schizophrenia and anxiety  are also affecting patient's functional outcome.    REHAB POTENTIAL: Good   CLINICAL DECISION MAKING: Evolving/moderate complexity   EVALUATION COMPLEXITY: Moderate     GOALS: Goals reviewed with patient? Yes   SHORT TERM GOALS: Target date: 01/21/2022   Patient will be independent in home exercise program to improve strength/mobility for better functional independence with ADLs. Baseline: Goal status: INITIAL   2.  Pt will ambulate 200+ feet using RW without LOB or LLE buckling to demonstrate progression towards improved ambulatory endurance.  Baseline: 160ft  Goal status: INITIAL   3.   Patient will deny any falls over past 4 weeks to demonstrate improved safety at home.  Baseline: 4 falls since surgery  Goal status: INITIAL     LONG TERM GOALS: Target date: 03/04/2022   Patient will increase FOTO score to equal to or greater than 41 to demonstrate statistically significant improvement in mobility and quality of life.  Baseline: 01/07/22: 23/41 Goal status: INITIAL   2.   Patient (< 69 years old)  will complete five times sit to stand test in < 10 seconds indicating an increased LE strength and improved balance. Baseline: 01/07/22: 43 seconds Goal status: INITIAL   3.  Patient will increase 10 meter walk test to >1.20m/s as to improve gait speed for better community ambulation and to reduce fall risk. Baseline: 01/07/22: 0.41 m/s Goal status: INITIAL   4.  Patient will increase six minute walk test distance to >1000 for progression to community ambulator and improve gait ability. Baseline: 01/14/22: 280 feet Goal status: INITIAL   5.  Patient will increase LLE gross strength to 4/5 as to improve functional strength for independent gait, increased standing tolerance and increased ADL ability. Baseline: 01/07/22: ranging 3-4/5, please see objective  Goal status: INITIAL         PLAN: PT FREQUENCY: 2x/week   PT DURATION: 8 weeks   PLANNED INTERVENTIONS: Therapeutic exercises, Therapeutic activity, Neuromuscular re-education, Balance training, Gait training, Patient/Family education, Joint mobilization, Stair training, DME instructions, Dry Needling, Cryotherapy, Moist heat, and Manual therapy.   PLAN FOR NEXT SESSION: LE desensitization, global LE strengthening, core/lumbar strengthening, gait training with progressive weightbearing activity and gradual AD wean as able once weightbearing improves. Modalities for pain modulation as needed. Re-assessment next visit.      Consuela Mimes, PT, DPT #[id number]  Gertie Exon, PT 02/09/2022, 10:00 PM

## 2022-02-09 ENCOUNTER — Encounter: Payer: Self-pay | Admitting: Physical Therapy

## 2022-02-11 ENCOUNTER — Ambulatory Visit: Payer: Medicare Other | Admitting: Physical Therapy

## 2022-02-11 ENCOUNTER — Encounter: Payer: Self-pay | Admitting: Physical Therapy

## 2022-02-11 DIAGNOSIS — M6281 Muscle weakness (generalized): Secondary | ICD-10-CM

## 2022-02-11 DIAGNOSIS — R2681 Unsteadiness on feet: Secondary | ICD-10-CM

## 2022-02-11 DIAGNOSIS — M545 Low back pain, unspecified: Secondary | ICD-10-CM

## 2022-02-11 DIAGNOSIS — R269 Unspecified abnormalities of gait and mobility: Secondary | ICD-10-CM

## 2022-02-11 DIAGNOSIS — Z981 Arthrodesis status: Secondary | ICD-10-CM

## 2022-02-11 NOTE — Therapy (Signed)
OUTPATIENT PHYSICAL THERAPY TREATMENT AND PROGRESS NOTE   Dates of reporting period  01/07/22   to   02/11/22    Patient Name: Justin Nixon MRN: [id number] DOB:Mar 16, 1976, 46 y.o., male Today's Date: 02/11/2022  PCP: Baxter Hire, MD REFERRING PROVIDER: Meade Maw, MD  END OF SESSION:   PT End of Session - 02/11/22 0859     Visit Number 10    Number of Visits 17    Date for PT Re-Evaluation 03/04/22    Progress Note Due on Visit 10    PT Start Time 0900    PT Stop Time 0944    PT Time Calculation (min) 44 min    Equipment Utilized During Treatment Gait belt    Activity Tolerance Patient limited by fatigue;Patient limited by pain    Behavior During Therapy WFL for tasks assessed/performed             Past Medical History:  Diagnosis Date   Anxiety    Arthritis    GERD (gastroesophageal reflux disease)    H/O   Hyperlipidemia    Schizophrenia ([place])    Past Surgical History:  Procedure Laterality Date   ANTERIOR LATERAL LUMBAR FUSION WITH PERCUTANEOUS SCREW 1 LEVEL N/A 11/18/2021   Procedure: L4-5 LATERAL LUMBAR INTERBODY FUSION WITH POSTERIOR SPINAL FUSION;  Surgeon: Meade Maw, MD;  Location: [facility];  Service: Neurosurgery;  Laterality: N/A;   APPLICATION OF INTRAOPERATIVE CT SCAN N/A 11/18/2021   Procedure: APPLICATION OF INTRAOPERATIVE CT SCAN;  Surgeon: Meade Maw, MD;  Location: [facility];  Service: Neurosurgery;  Laterality: N/A;   COLONOSCOPY     CYST EXCISION     ELBOW ARTHROSCOPY Left 10/10/2020   Procedure: LEFT ELBOW ARTHROSCOPY WITH DEBRIDEMENT AND EXCISION OF LOOSE BODIES;  Surgeon: Corky Mull, MD;  Location: [facility];  Service: Orthopedics;  Laterality: Left;   MYRINGOTOMY WITH TUBE PLACEMENT     ORIF ELBOW FRACTURE Left 08/28/2021   Procedure: OPEN REDUCTION INTERNAL FIXATION (ORIF) ELBOW/OLECRANON FRACTURE;  Surgeon: Corky Mull, MD;  Location: [facility];  Service: Orthopedics;  Laterality: Left;   SHOULDER ARTHROSCOPY Left  03/18/2017   Procedure: ARTHROSCOPY SHOULDER with debridement decompression and distal clavicle excision;  Surgeon: Corky Mull, MD;  Location: [facility];  Service: Orthopedics;  Laterality: Left;   SHOULDER ARTHROSCOPY WITH OPEN ROTATOR CUFF REPAIR Right 09/23/2016   Procedure: SHOULDER ARTHROSCOPY WITH OPEN ROTATOR CUFF REPAIR;  Surgeon: Corky Mull, MD;  Location: [facility];  Service: Orthopedics;  Laterality: Right;   SHOULDER ARTHROSCOPY WITH SUBACROMIAL DECOMPRESSION Right 09/23/2016   Procedure: SHOULDER ARTHROSCOPY WITH SUBACROMIAL DECOMPRESSION AND DEBRIDEMENT;  Surgeon: Corky Mull, MD;  Location: [facility];  Service: Orthopedics;  Laterality: Right;   Patient Active Problem List   Diagnosis Date Noted   S/P spinal fusion 11/18/2021   Olecranon fracture, left, open type I or II, initial encounter 08/28/2021        REFERRING DIAG: Z98.1 (ICD-10-CM) - History of lumbar fusion   THERAPY DIAG:  Muscle weakness (generalized)   S/P lumbar fusion   Abnormality of gait and mobility   Unsteadiness on feet   Rationale for Evaluation and Treatment Rehabilitation   PERTINENT HISTORY: Pt presents to physical therapy for evaluation of low back and LE pain and weakness L>R, following L4-L5 XLIF/PSF on 11/18/21. Pt states he was using a cane before surgery due to onset of LLE weakness. States he has now been using a RW since surgery. Pain ranges from  a 5/10 to a 10/10. When aggravated pain is described as sharp and shooting into LLE; at baseline, pain is achy. Wearing clothes such as jeans is painful due to sensitivity of skin; at home wears pajamas. Pt has not returned to driving; he is limited to household distance ambulation or maximum into a doctors office. He feels as though his LLE is going to "give out" when he is walking or weightbearing. 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 Reports frequent falling since surgery due to LLE giving out; no injuries associated with falls.    PRECAUTIONS: Back - no bending, limited twisting, no lifting > 25#     OBJECTIVE: (objective measures completed at initial evaluation unless otherwise dated)   DIAGNOSTIC FINDINGS:  "Intraoperative images during L4-L5 anterior and posterior fusion. No evidence of immediate complication"   PATIENT SURVEYS:  Modified Oswestry (updated 01/09/22 due to incomplete form): 62%.   02/11/22:  FOTO 23/41   SCREENING FOR RED FLAGS: Bowel or bladder incontinence: No Spinal tumors: No Cauda equina syndrome: No Compression fracture: No Abdominal aneurysm: No   COGNITION:           Overall cognitive status: Within functional limits for tasks assessed                          SENSATION: Light touch: Impaired  - increased sensitivity in left L2 and L3 dermatomes    MUSCLE LENGTH: Hamstrings: unable to test due to increase in sharp/shooting pain in hip flexors prior to HS stretch being felt.  (01/28/22): R lacking 30 deg, L lacking 40 deg.   Thomas test: NT   POSTURE: decreased lumbar lordosis and posterior pelvic tilt   PALPATION: Right: hip flexors and lateral hip, glute med, piriformis, deep external rotators, QL Left: hip flexors, lateral hip and adductors (from proximal hip to mid-thigh), proximal hamstrings, L4-distal paraspinals, QL, all gluteals   OBSERVATION 01/21/22: No bruising, ecchymosis, increased tissue temperature, or erythema observed along L flank, L gluteal region, or adjacent to surgical incisions. No sign of infection or delayed healing   LUMBAR ROM: not assessed due to back precautions    Active  A/PROM  eval  Flexion -  Extension -  Right lateral flexion -  Left lateral flexion -  Right rotation -  Left rotation -   (Blank rows = not tested)   LOWER EXTREMITY ROM:      Passive  Right eval Left eval  Hip flexion 100 90  Hip extension NT NT  Hip  abduction 20 20  Hip adduction      Hip internal rotation      Hip external rotation      Knee flexion [facility]  Knee extension [facility]  Ankle dorsiflexion [facility]  Ankle plantarflexion      Ankle inversion      Ankle eversion       (Blank rows = not tested)   LOWER EXTREMITY MMT:     MMT Right eval Left eval Right 02/11/22 Left 02/11/22  Hip flexion 4-* 4-* 4+ 4*  Hip extension        Hip abduction 4+* 4-* 4+ 4+  Hip adduction 4* 4* 4+ 4+  Hip internal rotation 5 3* 5 4*  Hip external rotation 5 4* 5 4*  Knee flexion 5 4* 5 4+  Knee extension 5 4+  5 4  Ankle dorsiflexion 4+ 3+ 5 4  Ankle plantarflexion         (Blank rows = not tested)     FUNCTIONAL TESTS:  5 times sit to stand: 43.37 seconds using arms.    02/11/22: 36.1 seconds 6 minute walk test (performed 01/14/22): 280 feet.     02/11/22: 600 feet 10 meter walk test: 24.3 seconds (comfortable) = 0.41 m/s using RW; terminated fast-pace for safety.   02/11/22: 0.64 m/s comfortable speed, 0.65 m/s fast speed   STS mechanics: no UE support, use of momentum with rapid hip hinge to initiate sit to stand. Intermittent LLE buckling and varus/valgus immediately following initiation of sit to stand   GAIT: Distance walked: 600 feet Assistive device utilized: Walker - 2 wheeled Level of assistance: standby assist Comments: good heel to toe pattern, bilateral UE support on FWW, intermittent LLE uncontrolled knee flexion/buckling requiring heavy UE support and pause in gait prior to continuing c walking       TODAY'S TREATMENT    SUBJECTIVE: Patient reports pain up to 6/10 with cooking yesterday. He reports some bruising affecting thoracolumbar junction region. Patient reports 5/10 pain at arrival to PT. Patient reports good progress to date with return of function. Patient reports 60% SANE score presently. Patient reports some difficulty with prolonged standing and walking. He has intermittent LLE buckling. Patient reports  remaining burning sensation in his L hip and L thigh. Patient reports he needs improved walking. 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    PAIN:  Are you having pain? No  Pain location: Pain in L thigh and knee, L         Manual Therapy - for symptom modulation, soft tissue sensitivity and mobility, joint mobility, ROM    *Manual therapy not performed today* STM and IASTM with [employer]: L quadratus lumborum, L erector spinae L3-5, L gluteal musculature        Therapeutic Exercise - for improved soft tissue flexibility and extensibility as needed for ROM, improved strength as needed to improve performance of CKC activities/functional movements   *Goal update performed*    In // bars:  Forward/backward stepping with therapist guarding patient posteriorly in case of LLE buckling, x 4 D/B length of bars Standing march with bilateral light touch on single parallel bar for complete weight shift to each lower limb; x10 alternating Dynamic march 3x D/B length of // bars               *not today* Sidestep with minimal upper limb support, careful guarding with intermittent LLE buckling; x4 D/B length of bars Seated hip abduction with Grn Tband, with abdominal brace and upright posture/neutral spine; 2x10 Sit to stand from raised table; maintaining neutral spine; 2x10 (approx 86 deg knee flexion) Active hamstring stretch (glider technique): 2x10 on each LE Piriformis stretch; 20x, 1 sec Abdominal brace with Bridge c neutral spine,  full range; 2x10 Abdominal brace with alternating march and shoulder flexion; 2x10 alternating R/L Standing toe tap onto 6-inch step; 2x10 alternating R/L for weight shifting and weight acceptance onto L lower limb Standing heel raise; 2x10 with bilat support on front-wheeled walker Abdominal brace/TrA contraction  with alternating adductor ball squeeze/hip ABD iso against belt; 2x10, 5 sec  6-minute walk with front-wheel walker Abdominal brace/TrA contraction; x10, 5 sec Glute set; 2x10, 5 sec  HOME EXERCISE PROGRAM: Access Code [email or web address]       ASSESSMENT:   CLINICAL IMPRESSION: Patient has met his FOTO goal and strength goal based on MMTs performed today. He is compliant with his home exercise and desensitization program. He is able to ambulate further with his FWW without LLE buckling. His 6-minute walk test, gait speed, and 5-times sit to stand have improved; however, he has not yet met long-term goals for these performance-based measures. Pain is still around 4-5/10 without significant flare-ups post-operatively. He does demonstrate improving weight acceptance onto L lower limb and is able to perform stepping drills and full weight shifting with no UE support. He has made good progress to date, but he still has remaining post-operative pain, weakness, and gait deficits necessitating continued PT intervention. Pt will benefit from skilled physical therapy to address the noted deficits in order to increase safety with functional activities and allow pt to return to work and home duties.      OBJECTIVE IMPAIRMENTS Abnormal gait, decreased activity tolerance, decreased balance, decreased endurance, decreased mobility, difficulty walking, decreased ROM, decreased strength, decreased safety awareness, impaired flexibility, impaired sensation, postural dysfunction, and pain.    ACTIVITY LIMITATIONS carrying, lifting, bending, sitting, standing, squatting, stairs, and transfers   PARTICIPATION LIMITATIONS: cleaning, laundry, driving, shopping, community activity, occupation, and yard work   PERSONAL FACTORS Time since onset of injury/illness/exacerbation and 1-2 comorbidities: schizophrenia and anxiety  are also affecting patient's functional outcome.    REHAB POTENTIAL: Good   CLINICAL  DECISION MAKING: Evolving/moderate complexity   EVALUATION COMPLEXITY: Moderate     GOALS: Goals reviewed with patient? Yes   SHORT TERM GOALS: Target date: 01/21/2022   Patient will be independent in home exercise program to improve strength/mobility for better functional independence with ADLs. Baseline:   02/11/22: Pt is compliant with his HEP  Goal status: ACHIEVED   2.  Pt will ambulate 200+ feet using RW without LOB or LLE buckling to demonstrate progression towards improved ambulatory endurance.  Baseline: 142f .    02/11/22: Pt able to ambulate > 200 feet without LLE buckling Goal status: ACHIEVED   3.   Patient will deny any falls over past 4 weeks to demonstrate improved safety at home.  Baseline: 4 falls since surgery.   02/11/22: 2 falls in June  Goal status: NOT MET     LONG TERM GOALS: Target date: 03/04/2022   Patient will increase FOTO score to equal to or greater than 41 to demonstrate statistically significant improvement in mobility and quality of life.  Baseline: 01/07/22: 23/41.  7/11/2: 44/41 Goal status: ACHIEVED   2.   Patient (< 674years old) will complete five times sit to stand test in < 10 seconds indicating an increased LE strength and improved balance. Baseline: 01/07/22: 43 seconds.   02/11/22: 36.1 sec Goal status: IN PROGRESS   3.  Patient will increase 10 meter walk test to >1.036m as to improve gait speed for better community ambulation and to reduce fall risk. Baseline: 01/07/22: 0.41 m/s.   02/11/22: 0.65 m/s Goal status: IN PROGRESS   4.  Patient will increase six minute walk test distance to >1000 for progression to community ambulator and improve gait ability. Baseline: 01/14/22: 280 feet.  02/11/22: 600 feet.  Goal status: IN PROGRESS   5.  Patient will increase LLE gross strength to 4/5 as to improve functional strength for independent gait, increased standing tolerance and increased ADL ability. Baseline: 01/07/22: ranging 3-4/5, please see  objective.  02/11/22: 4 to 4+/5 MMTs, see above.  Goal status: ACHIEVED         PLAN: PT FREQUENCY: 2x/week   PT DURATION: 8 weeks   PLANNED INTERVENTIONS: Therapeutic exercises, Therapeutic activity, Neuromuscular re-education, Balance training, Gait training, Patient/Family education, Joint mobilization, Stair training, DME instructions, Dry Needling, Cryotherapy, Moist heat, and Manual therapy.   PLAN FOR NEXT SESSION: LE desensitization, global LE strengthening, core/lumbar strengthening, gait training with progressive weightbearing activity and gradual AD wean as able once weightbearing improves. Modalities for pain modulation as needed.         Valentina Gu, PT, DPT #[id number]  Eilleen Kempf, PT 02/11/2022, 8:59 AM

## 2022-02-13 ENCOUNTER — Encounter: Payer: Self-pay | Admitting: Physical Therapy

## 2022-02-13 ENCOUNTER — Ambulatory Visit: Payer: Medicare Other | Admitting: Physical Therapy

## 2022-02-13 DIAGNOSIS — Z981 Arthrodesis status: Secondary | ICD-10-CM

## 2022-02-13 DIAGNOSIS — M6281 Muscle weakness (generalized): Secondary | ICD-10-CM | POA: Diagnosis not present

## 2022-02-13 DIAGNOSIS — R2681 Unsteadiness on feet: Secondary | ICD-10-CM

## 2022-02-13 DIAGNOSIS — M545 Low back pain, unspecified: Secondary | ICD-10-CM

## 2022-02-13 DIAGNOSIS — R269 Unspecified abnormalities of gait and mobility: Secondary | ICD-10-CM

## 2022-02-13 NOTE — Therapy (Signed)
OUTPATIENT PHYSICAL THERAPY TREATMENT NOTE   Patient Name: Justin Nixon MRN: [id number] DOB:1975/10/31, 46 y.o., male Today's Date: 02/13/2022  PCP: Baxter Hire, MD REFERRING PROVIDER: Meade Maw, MD  END OF SESSION:   PT End of Session - 02/13/22 0857     Visit Number 11    Number of Visits 17    Date for PT Re-Evaluation 03/04/22    Progress Note Due on Visit 10    PT Start Time 0858    PT Stop Time 0942    PT Time Calculation (min) 44 min    Equipment Utilized During Treatment Gait belt    Activity Tolerance Patient limited by fatigue;Patient limited by pain    Behavior During Therapy WFL for tasks assessed/performed             Past Medical History:  Diagnosis Date   Anxiety    Arthritis    GERD (gastroesophageal reflux disease)    H/O   Hyperlipidemia    Schizophrenia ([place])    Past Surgical History:  Procedure Laterality Date   ANTERIOR LATERAL LUMBAR FUSION WITH PERCUTANEOUS SCREW 1 LEVEL N/A 11/18/2021   Procedure: L4-5 LATERAL LUMBAR INTERBODY FUSION WITH POSTERIOR SPINAL FUSION;  Surgeon: Meade Maw, MD;  Location: [facility];  Service: Neurosurgery;  Laterality: N/A;   APPLICATION OF INTRAOPERATIVE CT SCAN N/A 11/18/2021   Procedure: APPLICATION OF INTRAOPERATIVE CT SCAN;  Surgeon: Meade Maw, MD;  Location: [facility];  Service: Neurosurgery;  Laterality: N/A;   COLONOSCOPY     CYST EXCISION     ELBOW ARTHROSCOPY Left 10/10/2020   Procedure: LEFT ELBOW ARTHROSCOPY WITH DEBRIDEMENT AND EXCISION OF LOOSE BODIES;  Surgeon: Corky Mull, MD;  Location: [facility];  Service: Orthopedics;  Laterality: Left;   MYRINGOTOMY WITH TUBE PLACEMENT     ORIF ELBOW FRACTURE Left 08/28/2021   Procedure: OPEN REDUCTION INTERNAL FIXATION (ORIF) ELBOW/OLECRANON FRACTURE;  Surgeon: Corky Mull, MD;  Location: [facility];  Service: Orthopedics;  Laterality: Left;   SHOULDER ARTHROSCOPY Left 03/18/2017   Procedure: ARTHROSCOPY SHOULDER with debridement  decompression and distal clavicle excision;  Surgeon: Corky Mull, MD;  Location: [place];  Service: Orthopedics;  Laterality: Left;   SHOULDER ARTHROSCOPY WITH OPEN ROTATOR CUFF REPAIR Right 09/23/2016   Procedure: SHOULDER ARTHROSCOPY WITH OPEN ROTATOR CUFF REPAIR;  Surgeon: Corky Mull, MD;  Location: [facility];  Service: Orthopedics;  Laterality: Right;   SHOULDER ARTHROSCOPY WITH SUBACROMIAL DECOMPRESSION Right 09/23/2016   Procedure: SHOULDER ARTHROSCOPY WITH SUBACROMIAL DECOMPRESSION AND DEBRIDEMENT;  Surgeon: Corky Mull, MD;  Location: [facility];  Service: Orthopedics;  Laterality: Right;   Patient Active Problem List   Diagnosis Date Noted   S/P spinal fusion 11/18/2021   Olecranon fracture, left, open type I or II, initial encounter 08/28/2021       REFERRING DIAG: Z98.1 (ICD-10-CM) - History of lumbar fusion   THERAPY DIAG:  Muscle weakness (generalized)   S/P lumbar fusion   Abnormality of gait and mobility   Unsteadiness on feet   Rationale for Evaluation and Treatment Rehabilitation   PERTINENT HISTORY: Pt presents to physical therapy for evaluation of low back and LE pain and weakness L>R, following L4-L5 XLIF/PSF on 11/18/21. Pt states he was using a cane before surgery due to onset of LLE weakness. States he has now been using a RW since surgery. Pain ranges from a 5/10 to a 10/10. When aggravated pain is described as sharp and shooting into LLE; at baseline,  pain is achy. Wearing clothes such as jeans is painful due to sensitivity of skin; at home wears pajamas. Pt has not returned to driving; he is limited to household distance ambulation or maximum into a doctors office. He feels as though his LLE is going to "give out" when he is walking or weightbearing. 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 Reports frequent falling since surgery due to LLE  giving out; no injuries associated with falls.    PRECAUTIONS: Back - no bending, limited twisting, no lifting > 25#. Per physician, after 12 weeks post-op, progress activity as tolerated.      OBJECTIVE: (objective measures completed at initial evaluation unless otherwise dated)   DIAGNOSTIC FINDINGS:  "Intraoperative images during L4-L5 anterior and posterior fusion. No evidence of immediate complication"   PATIENT SURVEYS:  Modified Oswestry (updated 01/09/22 due to incomplete form): 62%.   02/11/22: 48% FOTO 23/41   SCREENING FOR RED FLAGS: Bowel or bladder incontinence: No Spinal tumors: No Cauda equina syndrome: No Compression fracture: No Abdominal aneurysm: No   COGNITION:           Overall cognitive status: Within functional limits for tasks assessed                          SENSATION: Light touch: Impaired  - increased sensitivity in left L2 and L3 dermatomes    MUSCLE LENGTH: Hamstrings: unable to test due to increase in sharp/shooting pain in hip flexors prior to HS stretch being felt.  (01/28/22): R lacking 30 deg, L lacking 40 deg.   Thomas test: NT   POSTURE: decreased lumbar lordosis and posterior pelvic tilt   PALPATION: Right: hip flexors and lateral hip, glute med, piriformis, deep external rotators, QL Left: hip flexors, lateral hip and adductors (from proximal hip to mid-thigh), proximal hamstrings, L4-distal paraspinals, QL, all gluteals   OBSERVATION 01/21/22: No bruising, ecchymosis, increased tissue temperature, or erythema observed along L flank, L gluteal region, or adjacent to surgical incisions. No sign of infection or delayed healing   LUMBAR ROM: not assessed due to back precautions    Active  A/PROM  eval  Flexion -  Extension -  Right lateral flexion -  Left lateral flexion -  Right rotation -  Left rotation -   (Blank rows = not tested)   LOWER EXTREMITY ROM:      Passive  Right eval Left eval  Hip flexion 100 90  Hip extension NT  NT  Hip abduction 20 20  Hip adduction      Hip internal rotation      Hip external rotation      Knee flexion [facility]  Knee extension [facility]  Ankle dorsiflexion [facility]  Ankle plantarflexion      Ankle inversion      Ankle eversion       (Blank rows = not tested)   LOWER EXTREMITY MMT:     MMT Right eval Left eval Right 02/11/22 Left 02/11/22  Hip flexion 4-* 4-* 4+ 4*  Hip extension          Hip abduction 4+* 4-* 4+ 4+  Hip adduction 4* 4* 4+ 4+  Hip internal rotation 5 3* 5 4*  Hip external rotation 5 4* 5 4*  Knee flexion 5 4* 5 4+  Knee extension 5 4+ 5 4  Ankle dorsiflexion  4+ 3+ 5 4  Ankle plantarflexion           (Blank rows = not tested)     FUNCTIONAL TESTS:  5 times sit to stand: 43.37 seconds using arms.    02/11/22: 36.1 seconds 6 minute walk test (performed 01/14/22): 280 feet.     02/11/22: 600 feet 10 meter walk test: 24.3 seconds (comfortable) = 0.41 m/s using RW; terminated fast-pace for safety.   02/11/22: 0.64 m/s comfortable speed, 0.65 m/s fast speed   STS mechanics: no UE support, use of momentum with rapid hip hinge to initiate sit to stand. Intermittent LLE buckling and varus/valgus immediately following initiation of sit to stand   GAIT: Distance walked: 600 feet Assistive device utilized: Walker - 2 wheeled Level of assistance: standby assist Comments: good heel to toe pattern, bilateral UE support on FWW, intermittent LLE uncontrolled knee flexion/buckling requiring heavy UE support and pause in gait prior to continuing c walking       TODAY'S TREATMENT    SUBJECTIVE: Patient reports doing well with updated HEP. He reports getting about 3-4 hours of sleep the previous night due to frequently tossing/turning. Patient reports 4/10 pain at arrival PT. Pt reports difficulty with standing/walking and cooking in his home. Patient reports notable burning along L posterolateral hip and thigh. He feels that sensitivity in L thigh is improving.     PAIN:  Are you having pain? Yes, 4/10 pain at arrival.  Pain location: Pain in L thigh and knee, L hip        Manual Therapy - for symptom modulation, soft tissue sensitivity and mobility, joint mobility, ROM    *Manual therapy not performed today* STM and IASTM with Hypervolt: L quadratus lumborum, L erector spinae L3-5, L gluteal musculature         Therapeutic Exercise - for improved soft tissue flexibility and extensibility as needed for ROM, improved strength as needed to improve performance of CKC activities/functional movements   NuStep, seat at 11 and arms at 11; Level 2 resistance; 5 minutes   Gait with SPC following demo from PT; ambulate around gym x 3 laps with VC for cane/foot sequencing   In Hallway with no AD:  Forward/backward stepping with therapist guarding patient posteriorly in case of LLE buckling, x 4 D/B 30-ft course Sidestep with minimal upper limb support, careful guarding with intermittent LLE buckling; x4 D/B 30-ft course  Adjacent to treadmill handrail for R-sided UE support prn: Forward step-up to Airex pad; x10 on each LE Romberg stand on Airex pad  Supine dead bug with alternating knee extension/shoulder flexion; x15 alternating  Active hamstring stretch (glider technique): 2x10 on each LE              *not today* Dynamic march 3x D/B length of // bars Standing march with bilateral light touch on single parallel bar for complete weight shift to each lower limb; x10 alternating Seated hip abduction with Grn Tband, with abdominal brace and upright posture/neutral spine; 2x10 Sit to stand from raised table; maintaining neutral spine; 2x10 (approx 86 deg knee flexion) Piriformis stretch; 20x, 1 sec Abdominal brace with Bridge c neutral spine,  full range; 2x10 Abdominal brace with alternating march and shoulder flexion; 2x10 alternating R/L Standing toe tap onto 6-inch step; 2x10 alternating R/L for weight shifting and weight acceptance onto L  lower limb Standing heel raise; 2x10 with bilat support on front-wheeled walker Abdominal brace/TrA contraction with alternating adductor ball squeeze/hip ABD iso against belt;  2x10, 5 sec  6-minute walk with front-wheel walker Abdominal brace/TrA contraction; x10, 5 sec Glute set; 2x10, 5 sec           HOME EXERCISE PROGRAM: Access Code [email or web address]       ASSESSMENT:   CLINICAL IMPRESSION: Patient has made clinically significant improvement in modified ODI. He has remaining moderate-level pain that has minimally changed; however his activity tolerance, capacity for performing ambulatory activity, and ability to perform sit to stand has remarkably improved. Patient has participated very well with PT to date and demonstrates improving gait pattern with less occurrence of LLE buckling during stance phase with less reliance on AD. He has made good progress to date, but he still has remaining post-operative pain, weakness, and gait deficits necessitating continued PT intervention. Pt will benefit from skilled physical therapy to address the noted deficits in order to increase safety with functional activities and allow pt to return to work and home duties.      OBJECTIVE IMPAIRMENTS Abnormal gait, decreased activity tolerance, decreased balance, decreased endurance, decreased mobility, difficulty walking, decreased ROM, decreased strength, decreased safety awareness, impaired flexibility, impaired sensation, postural dysfunction, and pain.    ACTIVITY LIMITATIONS carrying, lifting, bending, sitting, standing, squatting, stairs, and transfers   PARTICIPATION LIMITATIONS: cleaning, laundry, driving, shopping, community activity, occupation, and yard work   PERSONAL FACTORS Time since onset of injury/illness/exacerbation and 1-2 comorbidities: schizophrenia and anxiety  are also affecting patient's functional outcome.    REHAB POTENTIAL: Good   CLINICAL DECISION MAKING: Evolving/moderate  complexity   EVALUATION COMPLEXITY: Moderate     GOALS: Goals reviewed with patient? Yes   SHORT TERM GOALS: Target date: 01/21/2022   Patient will be independent in home exercise program to improve strength/mobility for better functional independence with ADLs. Baseline:   02/11/22: Pt is compliant with his HEP  Goal status: ACHIEVED   2.  Pt will ambulate 200+ feet using RW without LOB or LLE buckling to demonstrate progression towards improved ambulatory endurance.  Baseline: 185ft .    02/11/22: Pt able to ambulate > 200 feet without LLE buckling Goal status: ACHIEVED   3.   Patient will deny any falls over past 4 weeks to demonstrate improved safety at home.  Baseline: 4 falls since surgery.   02/11/22: 2 falls in June  Goal status: NOT MET     LONG TERM GOALS: Target date: 03/04/2022   Patient will increase FOTO score to equal to or greater than 41 to demonstrate statistically significant improvement in mobility and quality of life.  Baseline: 01/07/22: 23/41.  7/11/2: 44/41 Goal status: ACHIEVED   2.   Patient (< 18 years old) will complete five times sit to stand test in < 10 seconds indicating an increased LE strength and improved balance. Baseline: 01/07/22: 43 seconds.   02/11/22: 36.1 sec Goal status: IN PROGRESS   3.  Patient will increase 10 meter walk test to >1.78m/s as to improve gait speed for better community ambulation and to reduce fall risk. Baseline: 01/07/22: 0.41 m/s.   02/11/22: 0.65 m/s Goal status: IN PROGRESS   4.  Patient will increase six minute walk test distance to >1000 for progression to community ambulator and improve gait ability. Baseline: 01/14/22: 280 feet.  02/11/22: 600 feet.  Goal status: IN PROGRESS   5.  Patient will increase LLE gross strength to 4/5 as to improve functional strength for independent gait, increased standing tolerance and increased ADL ability. Baseline: 01/07/22: ranging 3-4/5, please see objective.  02/11/22: 4 to 4+/5 MMTs, see  above.  Goal status: ACHIEVED         PLAN: PT FREQUENCY: 2x/week   PT DURATION: 8 weeks   PLANNED INTERVENTIONS: Therapeutic exercises, Therapeutic activity, Neuromuscular re-education, Balance training, Gait training, Patient/Family education, Joint mobilization, Stair training, DME instructions, Dry Needling, Cryotherapy, Moist heat, and Manual therapy.   PLAN FOR NEXT SESSION: LE desensitization, global LE strengthening, core/lumbar strengthening, gait training with progressive weightbearing activity and gradual AD wean as able once weightbearing improves. Modalities for pain modulation as needed.          Valentina Gu, PT, DPT #[id number]  Eilleen Kempf, PT 02/13/2022, 8:58 AM

## 2022-02-18 ENCOUNTER — Encounter: Payer: Self-pay | Admitting: Physical Therapy

## 2022-02-18 ENCOUNTER — Ambulatory Visit: Payer: Medicare Other | Admitting: Physical Therapy

## 2022-02-18 DIAGNOSIS — M6281 Muscle weakness (generalized): Secondary | ICD-10-CM

## 2022-02-18 DIAGNOSIS — Z981 Arthrodesis status: Secondary | ICD-10-CM

## 2022-02-18 DIAGNOSIS — R269 Unspecified abnormalities of gait and mobility: Secondary | ICD-10-CM

## 2022-02-18 DIAGNOSIS — M545 Low back pain, unspecified: Secondary | ICD-10-CM

## 2022-02-18 DIAGNOSIS — R2681 Unsteadiness on feet: Secondary | ICD-10-CM

## 2022-02-18 NOTE — Therapy (Signed)
OUTPATIENT PHYSICAL THERAPY TREATMENT NOTE   Patient Name: Justin Nixon MRN: [id number] DOB:[birth year], 46 y.o., male Today's Date: 02/18/2022  PCP: Baxter Hire, MD REFERRING PROVIDER: Meade Maw, MD  END OF SESSION:   PT End of Session - 02/18/22 0913     Visit Number 12    Number of Visits 17    Date for PT Re-Evaluation 03/04/22    Progress Note Due on Visit 10    PT Start Time 0906    PT Stop Time 0948    PT Time Calculation (min) 42 min    Equipment Utilized During Treatment Gait belt    Activity Tolerance Patient limited by fatigue;Patient limited by pain    Behavior During Therapy WFL for tasks assessed/performed             Past Medical History:  Diagnosis Date   Anxiety    Arthritis    GERD (gastroesophageal reflux disease)    H/O   Hyperlipidemia    Schizophrenia ([place])    Past Surgical History:  Procedure Laterality Date   ANTERIOR LATERAL LUMBAR FUSION WITH PERCUTANEOUS SCREW 1 LEVEL N/A 11/18/2021   Procedure: L4-5 LATERAL LUMBAR INTERBODY FUSION WITH POSTERIOR SPINAL FUSION;  Surgeon: Meade Maw, MD;  Location: [facility];  Service: Neurosurgery;  Laterality: N/A;   APPLICATION OF INTRAOPERATIVE CT SCAN N/A 11/18/2021   Procedure: APPLICATION OF INTRAOPERATIVE CT SCAN;  Surgeon: Meade Maw, MD;  Location: [facility];  Service: Neurosurgery;  Laterality: N/A;   COLONOSCOPY     CYST EXCISION     ELBOW ARTHROSCOPY Left 10/10/2020   Procedure: LEFT ELBOW ARTHROSCOPY WITH DEBRIDEMENT AND EXCISION OF LOOSE BODIES;  Surgeon: Corky Mull, MD;  Location: [facility];  Service: Orthopedics;  Laterality: Left;   MYRINGOTOMY WITH TUBE PLACEMENT     ORIF ELBOW FRACTURE Left 08/28/2021   Procedure: OPEN REDUCTION INTERNAL FIXATION (ORIF) ELBOW/OLECRANON FRACTURE;  Surgeon: Corky Mull, MD;  Location: [facility];  Service: Orthopedics;  Laterality: Left;   SHOULDER ARTHROSCOPY Left 03/18/2017   Procedure: ARTHROSCOPY SHOULDER with debridement  decompression and distal clavicle excision;  Surgeon: Corky Mull, MD;  Location: [facility];  Service: Orthopedics;  Laterality: Left;   SHOULDER ARTHROSCOPY WITH OPEN ROTATOR CUFF REPAIR Right 09/23/2016   Procedure: SHOULDER ARTHROSCOPY WITH OPEN ROTATOR CUFF REPAIR;  Surgeon: Corky Mull, MD;  Location: [facility];  Service: Orthopedics;  Laterality: Right;   SHOULDER ARTHROSCOPY WITH SUBACROMIAL DECOMPRESSION Right 09/23/2016   Procedure: SHOULDER ARTHROSCOPY WITH SUBACROMIAL DECOMPRESSION AND DEBRIDEMENT;  Surgeon: Corky Mull, MD;  Location: [facility];  Service: Orthopedics;  Laterality: Right;   Patient Active Problem List   Diagnosis Date Noted   S/P spinal fusion 11/18/2021   Olecranon fracture, left, open type I or II, initial encounter 08/28/2021       REFERRING DIAG: Z98.1 (ICD-10-CM) - History of lumbar fusion   THERAPY DIAG:  Muscle weakness (generalized)   S/P lumbar fusion   Abnormality of gait and mobility   Unsteadiness on feet   Rationale for Evaluation and Treatment Rehabilitation   PERTINENT HISTORY: Pt presents to physical therapy for evaluation of low back and LE pain and weakness L>R, following L4-L5 XLIF/PSF on 11/18/21. Pt states he was using a cane before surgery due to onset of LLE weakness. States he has now been using a RW since surgery. Pain ranges from a 5/10 to a 10/10. When aggravated pain is described as sharp and shooting into LLE; at baseline,  pain is achy. Wearing clothes such as jeans is painful due to sensitivity of skin; at home wears pajamas. Pt has not returned to driving; he is limited to household distance ambulation or maximum into a doctors office. He feels as though his LLE is going to "give out" when he is walking or weightbearing. 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 Reports frequent falling since surgery due to LLE  giving out; no injuries associated with falls.    PRECAUTIONS: Back - no bending, limited twisting, no lifting > 25#. Per physician, after 12 weeks post-op, progress activity as tolerated.      OBJECTIVE: (objective measures completed at initial evaluation unless otherwise dated)   DIAGNOSTIC FINDINGS:  "Intraoperative images during L4-L5 anterior and posterior fusion. No evidence of immediate complication"   PATIENT SURVEYS:  Modified Oswestry (updated 01/09/22 due to incomplete form): 62%.   02/11/22: 48% FOTO 23/41   SCREENING FOR RED FLAGS: Bowel or bladder incontinence: No Spinal tumors: No Cauda equina syndrome: No Compression fracture: No Abdominal aneurysm: No   COGNITION:           Overall cognitive status: Within functional limits for tasks assessed                          SENSATION: Light touch: Impaired  - increased sensitivity in left L2 and L3 dermatomes    MUSCLE LENGTH: Hamstrings: unable to test due to increase in sharp/shooting pain in hip flexors prior to HS stretch being felt.  (01/28/22): R lacking 30 deg, L lacking 40 deg.   Thomas test: NT   POSTURE: decreased lumbar lordosis and posterior pelvic tilt   PALPATION: Right: hip flexors and lateral hip, glute med, piriformis, deep external rotators, QL Left: hip flexors, lateral hip and adductors (from proximal hip to mid-thigh), proximal hamstrings, L4-distal paraspinals, QL, all gluteals   OBSERVATION 01/21/22: No bruising, ecchymosis, increased tissue temperature, or erythema observed along L flank, L gluteal region, or adjacent to surgical incisions. No sign of infection or delayed healing   LUMBAR ROM: not assessed due to back precautions    Active  A/PROM  eval  Flexion -  Extension -  Right lateral flexion -  Left lateral flexion -  Right rotation -  Left rotation -   (Blank rows = not tested)   LOWER EXTREMITY ROM:      Passive  Right eval Left eval  Hip flexion 100 90  Hip extension NT  NT  Hip abduction 20 20  Hip adduction      Hip internal rotation      Hip external rotation      Knee flexion [facility]  Knee extension [facility]  Ankle dorsiflexion [facility]  Ankle plantarflexion      Ankle inversion      Ankle eversion       (Blank rows = not tested)   LOWER EXTREMITY MMT:     MMT Right eval Left eval Right 02/11/22 Left 02/11/22  Hip flexion 4-* 4-* 4+ 4*  Hip extension          Hip abduction 4+* 4-* 4+ 4+  Hip adduction 4* 4* 4+ 4+  Hip internal rotation 5 3* 5 4*  Hip external rotation 5 4* 5 4*  Knee flexion 5 4* 5 4+  Knee extension 5 4+ 5 4  Ankle dorsiflexion  4+ 3+ 5 4  Ankle plantarflexion           (Blank rows = not tested)     FUNCTIONAL TESTS:  5 times sit to stand: 43.37 seconds using arms.    02/11/22: 36.1 seconds 6 minute walk test (performed 01/14/22): 280 feet.     02/11/22: 600 feet 10 meter walk test: 24.3 seconds (comfortable) = 0.41 m/s using RW; terminated fast-pace for safety.   02/11/22: 0.64 m/s comfortable speed, 0.65 m/s fast speed   STS mechanics: no UE support, use of momentum with rapid hip hinge to initiate sit to stand. Intermittent LLE buckling and varus/valgus immediately following initiation of sit to stand   GAIT: Distance walked: 600 feet Assistive device utilized: Walker - 2 wheeled Level of assistance: standby assist Comments: good heel to toe pattern, bilateral UE support on FWW, intermittent LLE uncontrolled knee flexion/buckling requiring heavy UE support and pause in gait prior to continuing c walking       TODAY'S TREATMENT    SUBJECTIVE: Patient reports continuing to work on self-massage with massage gun and desensitization at home. Patient reports pain around 3-4/10 range recently. Pt arrives using SPC RUE.    PAIN:  Are you having pain? Yes, 3/10 pain at arrival.  Pain location: Pain in L thigh and knee, L hip        Manual Therapy - for symptom modulation, soft tissue sensitivity and mobility, joint  mobility, ROM    *Manual therapy not performed today* STM and IASTM with Hypervolt: L quadratus lumborum, L erector spinae L3-5, L gluteal musculature         Therapeutic Exercise - for improved soft tissue flexibility and extensibility as needed for ROM, improved strength as needed to improve performance of CKC activities/functional movements   NuStep, seat at 10 and arms at 11; Level 3 resistance; 5 minutes    Gait with SPC following demo from PT; ambulate around gym x 1 lap with VC for cane/foot sequencing   In Hallway with no AD:  Forward/backward stepping with therapist guarding patient posteriorly in case of LLE buckling, x 4 D/B 30-ft course Sidestep with minimal upper limb support, careful guarding with intermittent LLE buckling; x4 D/B 30-ft course   In // bars, dynamic forward march with contralateral knee touch; 4x D/B Adjacent to single parallel bar, seated on blue physioball, alternating march with upright posture; 1x10 alternating Forward step-up to Airex pad; x10 on each LE Romberg stand on Airex pad; 2x30sec    Supine on treatment table:  Supine dead bug with alternating knee extension/shoulder flexion; 2x10 alternating Active hamstring stretch (glider technique): 2x10 on each LE           Piriformis stretch; 20x, 1 sec      *not today* Dynamic march 3x D/B length of // bars Standing march with bilateral light touch on single parallel bar for complete weight shift to each lower limb; x10 alternating Seated hip abduction with Grn Tband, with abdominal brace and upright posture/neutral spine; 2x10 Sit to stand from raised table; maintaining neutral spine; 2x10 (approx 86 deg knee flexion)  Abdominal brace with Bridge c neutral spine,  full range; 2x10 Abdominal brace with alternating march and shoulder flexion; 2x10 alternating R/L Standing toe tap onto 6-inch step; 2x10 alternating R/L for weight shifting and weight acceptance onto L lower limb Standing heel  raise; 2x10 with bilat support on front-wheeled walker Abdominal brace/TrA contraction with alternating adductor ball squeeze/hip ABD iso against belt; 2x10,  5 sec  6-minute walk with front-wheel walker Abdominal brace/TrA contraction; x10, 5 sec Glute set; 2x10, 5 sec           HOME EXERCISE PROGRAM: Access Code [email or web address]       ASSESSMENT:   CLINICAL IMPRESSION: Patient demonstrates markedly improved gait pattern with symmetrical stance time on each LE with LLE buckling only after substantial volume of walking in clinic. He does have remaining burning and moderate L hip/thigh pain. He is continuing with stretching program and desensitization work at home. PT has increased emphasis on functional activities over the previous week and pt has made notable progress with gait and transfers. Pt will benefit from skilled physical therapy to address the noted deficits in order to increase safety with functional activities and allow pt to return to work and home duties.      OBJECTIVE IMPAIRMENTS Abnormal gait, decreased activity tolerance, decreased balance, decreased endurance, decreased mobility, difficulty walking, decreased ROM, decreased strength, decreased safety awareness, impaired flexibility, impaired sensation, postural dysfunction, and pain.    ACTIVITY LIMITATIONS carrying, lifting, bending, sitting, standing, squatting, stairs, and transfers   PARTICIPATION LIMITATIONS: cleaning, laundry, driving, shopping, community activity, occupation, and yard work   PERSONAL FACTORS Time since onset of injury/illness/exacerbation and 1-2 comorbidities: schizophrenia and anxiety  are also affecting patient's functional outcome.    REHAB POTENTIAL: Good   CLINICAL DECISION MAKING: Evolving/moderate complexity   EVALUATION COMPLEXITY: Moderate     GOALS: Goals reviewed with patient? Yes   SHORT TERM GOALS: Target date: 01/21/2022   Patient will be independent in home exercise program to  improve strength/mobility for better functional independence with ADLs. Baseline:   02/11/22: Pt is compliant with his HEP  Goal status: ACHIEVED   2.  Pt will ambulate 200+ feet using RW without LOB or LLE buckling to demonstrate progression towards improved ambulatory endurance.  Baseline: 173ft .    02/11/22: Pt able to ambulate > 200 feet without LLE buckling Goal status: ACHIEVED   3.   Patient will deny any falls over past 4 weeks to demonstrate improved safety at home.  Baseline: 4 falls since surgery.   02/11/22: 2 falls in June  Goal status: NOT MET     LONG TERM GOALS: Target date: 03/04/2022   Patient will increase FOTO score to equal to or greater than 41 to demonstrate statistically significant improvement in mobility and quality of life.  Baseline: 01/07/22: 23/41.  7/11/2: 44/41 Goal status: ACHIEVED   2.   Patient (< 29 years old) will complete five times sit to stand test in < 10 seconds indicating an increased LE strength and improved balance. Baseline: 01/07/22: 43 seconds.   02/11/22: 36.1 sec Goal status: IN PROGRESS   3.  Patient will increase 10 meter walk test to >1.88m/s as to improve gait speed for better community ambulation and to reduce fall risk. Baseline: 01/07/22: 0.41 m/s.   02/11/22: 0.65 m/s Goal status: IN PROGRESS   4.  Patient will increase six minute walk test distance to >1000 for progression to community ambulator and improve gait ability. Baseline: 01/14/22: 280 feet.  02/11/22: 600 feet.  Goal status: IN PROGRESS   5.  Patient will increase LLE gross strength to 4/5 as to improve functional strength for independent gait, increased standing tolerance and increased ADL ability. Baseline: 01/07/22: ranging 3-4/5, please see objective.   02/11/22: 4 to 4+/5 MMTs, see above.  Goal status: ACHIEVED         PLAN: PT  FREQUENCY: 2x/week   PT DURATION: 8 weeks   PLANNED INTERVENTIONS: Therapeutic exercises, Therapeutic activity, Neuromuscular re-education,  Balance training, Gait training, Patient/Family education, Joint mobilization, Stair training, DME instructions, Dry Needling, Cryotherapy, Moist heat, and Manual therapy.   PLAN FOR NEXT SESSION: LE strengthening, trunk stabilization, LE flexibility. Progress with increasing emphasis on standing/weightbearing activity and continued AD wean as able from [facility] to no AD. Modalities for pain modulation as needed.      Valentina Gu, PT, DPT #[id number]  Eilleen Kempf, PT 02/18/2022, 9:14 AM

## 2022-02-20 ENCOUNTER — Ambulatory Visit: Payer: Medicare Other | Admitting: Physical Therapy

## 2022-02-20 DIAGNOSIS — M6281 Muscle weakness (generalized): Secondary | ICD-10-CM | POA: Diagnosis not present

## 2022-02-20 DIAGNOSIS — M545 Low back pain, unspecified: Secondary | ICD-10-CM

## 2022-02-20 DIAGNOSIS — R2681 Unsteadiness on feet: Secondary | ICD-10-CM

## 2022-02-20 DIAGNOSIS — R269 Unspecified abnormalities of gait and mobility: Secondary | ICD-10-CM

## 2022-02-20 DIAGNOSIS — Z981 Arthrodesis status: Secondary | ICD-10-CM

## 2022-02-20 NOTE — Therapy (Signed)
OUTPATIENT PHYSICAL THERAPY TREATMENT NOTE   Patient Name: Justin Nixon MRN: [id number] DOB:1975/09/21, 46 y.o., male Today's Date: 02/24/2022  PCP: Baxter Hire, MD REFERRING PROVIDER: Meade Maw, MD  END OF SESSION:   PT End of Session - 02/24/22 1142     Visit Number 13    Number of Visits 17    Date for PT Re-Evaluation 03/04/22    Progress Note Due on Visit 10    PT Start Time 0858    PT Stop Time 0942    PT Time Calculation (min) 44 min    Equipment Utilized During Treatment Gait belt    Activity Tolerance Patient limited by fatigue;Patient limited by pain    Behavior During Therapy WFL for tasks assessed/performed             Past Medical History:  Diagnosis Date   Anxiety    Arthritis    GERD (gastroesophageal reflux disease)    H/O   Hyperlipidemia    Schizophrenia ([place])    Past Surgical History:  Procedure Laterality Date   ANTERIOR LATERAL LUMBAR FUSION WITH PERCUTANEOUS SCREW 1 LEVEL N/A 11/18/2021   Procedure: L4-5 LATERAL LUMBAR INTERBODY FUSION WITH POSTERIOR SPINAL FUSION;  Surgeon: Meade Maw, MD;  Location: [facility];  Service: Neurosurgery;  Laterality: N/A;   APPLICATION OF INTRAOPERATIVE CT SCAN N/A 11/18/2021   Procedure: APPLICATION OF INTRAOPERATIVE CT SCAN;  Surgeon: Meade Maw, MD;  Location: [facility];  Service: Neurosurgery;  Laterality: N/A;   COLONOSCOPY     CYST EXCISION     ELBOW ARTHROSCOPY Left 10/10/2020   Procedure: LEFT ELBOW ARTHROSCOPY WITH DEBRIDEMENT AND EXCISION OF LOOSE BODIES;  Surgeon: Corky Mull, MD;  Location: [facility];  Service: Orthopedics;  Laterality: Left;   MYRINGOTOMY WITH TUBE PLACEMENT     ORIF ELBOW FRACTURE Left 08/28/2021   Procedure: OPEN REDUCTION INTERNAL FIXATION (ORIF) ELBOW/OLECRANON FRACTURE;  Surgeon: Corky Mull, MD;  Location: [facility];  Service: Orthopedics;  Laterality: Left;   SHOULDER ARTHROSCOPY Left 03/18/2017   Procedure: ARTHROSCOPY SHOULDER with debridement  decompression and distal clavicle excision;  Surgeon: Corky Mull, MD;  Location: [place];  Service: Orthopedics;  Laterality: Left;   SHOULDER ARTHROSCOPY WITH OPEN ROTATOR CUFF REPAIR Right 09/23/2016   Procedure: SHOULDER ARTHROSCOPY WITH OPEN ROTATOR CUFF REPAIR;  Surgeon: Corky Mull, MD;  Location: [facility];  Service: Orthopedics;  Laterality: Right;   SHOULDER ARTHROSCOPY WITH SUBACROMIAL DECOMPRESSION Right 09/23/2016   Procedure: SHOULDER ARTHROSCOPY WITH SUBACROMIAL DECOMPRESSION AND DEBRIDEMENT;  Surgeon: Corky Mull, MD;  Location: [facility];  Service: Orthopedics;  Laterality: Right;   Patient Active Problem List   Diagnosis Date Noted   S/P spinal fusion 11/18/2021   Olecranon fracture, left, open type I or II, initial encounter 08/28/2021      REFERRING DIAG: Z98.1 (ICD-10-CM) - History of lumbar fusion   THERAPY DIAG:  Muscle weakness (generalized)   S/P lumbar fusion   Abnormality of gait and mobility   Unsteadiness on feet   Rationale for Evaluation and Treatment Rehabilitation   PERTINENT HISTORY: Pt presents to physical therapy for evaluation of low back and LE pain and weakness L>R, following L4-L5 XLIF/PSF on 11/18/21. Pt states he was using a cane before surgery due to onset of LLE weakness. States he has now been using a RW since surgery. Pain ranges from a 5/10 to a 10/10. When aggravated pain is described as sharp and shooting into LLE; at baseline, pain  is achy. Wearing clothes such as jeans is painful due to sensitivity of skin; at home wears pajamas. Pt has not returned to driving; he is limited to household distance ambulation or maximum into a doctors office. He feels as though his LLE is going to "give out" when he is walking or weightbearing. 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 Reports frequent falling since surgery due to LLE  giving out; no injuries associated with falls.    PRECAUTIONS: Back - no bending, limited twisting, no lifting > 25#. Per physician, after 12 weeks post-op, progress activity as tolerated.      OBJECTIVE: (objective measures completed at initial evaluation unless otherwise dated)   DIAGNOSTIC FINDINGS:  "Intraoperative images during L4-L5 anterior and posterior fusion. No evidence of immediate complication"   PATIENT SURVEYS:  Modified Oswestry (updated 01/09/22 due to incomplete form): 62%.   02/11/22: 48% FOTO 23/41   SCREENING FOR RED FLAGS: Bowel or bladder incontinence: No Spinal tumors: No Cauda equina syndrome: No Compression fracture: No Abdominal aneurysm: No   COGNITION:           Overall cognitive status: Within functional limits for tasks assessed                          SENSATION: Light touch: Impaired  - increased sensitivity in left L2 and L3 dermatomes    MUSCLE LENGTH: Hamstrings: unable to test due to increase in sharp/shooting pain in hip flexors prior to HS stretch being felt.  (01/28/22): R lacking 30 deg, L lacking 40 deg.   Thomas test: NT   POSTURE: decreased lumbar lordosis and posterior pelvic tilt   PALPATION: Right: hip flexors and lateral hip, glute med, piriformis, deep external rotators, QL Left: hip flexors, lateral hip and adductors (from proximal hip to mid-thigh), proximal hamstrings, L4-distal paraspinals, QL, all gluteals   OBSERVATION 01/21/22: No bruising, ecchymosis, increased tissue temperature, or erythema observed along L flank, L gluteal region, or adjacent to surgical incisions. No sign of infection or delayed healing   LUMBAR ROM: not assessed due to back precautions    Active  A/PROM  eval  Flexion -  Extension -  Right lateral flexion -  Left lateral flexion -  Right rotation -  Left rotation -   (Blank rows = not tested)   LOWER EXTREMITY ROM:      Passive  Right eval Left eval  Hip flexion 100 90  Hip extension NT  NT  Hip abduction 20 20  Hip adduction      Hip internal rotation      Hip external rotation      Knee flexion [facility]  Knee extension [facility]  Ankle dorsiflexion [facility]  Ankle plantarflexion      Ankle inversion      Ankle eversion       (Blank rows = not tested)   LOWER EXTREMITY MMT:     MMT Right eval Left eval Right 02/11/22 Left 02/11/22  Hip flexion 4-* 4-* 4+ 4*  Hip extension          Hip abduction 4+* 4-* 4+ 4+  Hip adduction 4* 4* 4+ 4+  Hip internal rotation 5 3* 5 4*  Hip external rotation 5 4* 5 4*  Knee flexion 5 4* 5 4+  Knee extension 5 4+ 5 4  Ankle dorsiflexion 4+  3+ 5 4  Ankle plantarflexion           (Blank rows = not tested)     FUNCTIONAL TESTS:  5 times sit to stand: 43.37 seconds using arms.    02/11/22: 36.1 seconds 6 minute walk test (performed 01/14/22): 280 feet.     02/11/22: 600 feet 10 meter walk test: 24.3 seconds (comfortable) = 0.41 m/s using RW; terminated fast-pace for safety.   02/11/22: 0.64 m/s comfortable speed, 0.65 m/s fast speed   STS mechanics: no UE support, use of momentum with rapid hip hinge to initiate sit to stand. Intermittent LLE buckling and varus/valgus immediately following initiation of sit to stand   GAIT: Distance walked: 600 feet Assistive device utilized: Environmental consultant - 2 wheeled Level of assistance: standby assist Comments: good heel to toe pattern, bilateral UE support on FWW, intermittent LLE uncontrolled knee flexion/buckling requiring heavy UE support and pause in gait prior to continuing c walking       TODAY'S TREATMENT    SUBJECTIVE: Patient reports intermittent pain affecting L lower abdominal quadrant, referred to as "pain in intestines." Patient reports this is intermittent. He states this did begin about 30 mins after eating. Patient reports L thigh pain has improved.    PAIN:  Are you having pain? Yes, 3/10 pain at arrival.  Pain location: Pain in L thigh and knee, L hip        Manual Therapy -  for symptom modulation, soft tissue sensitivity and mobility, joint mobility, ROM    *Manual therapy not performed today* STM and IASTM with Hypervolt: L quadratus lumborum, L erector spinae L3-5, L gluteal musculature         Therapeutic Exercise - for improved soft tissue flexibility and extensibility as needed for ROM, improved strength as needed to improve performance of CKC activities/functional movements    In Hallway with no AD:  Forward/backward stepping with therapist guarding patient posteriorly in case of LLE buckling, x 4 D/B 30-ft course Sidestep with minimal upper limb support, careful guarding with intermittent LLE buckling; x4 D/B 30-ft course   Sit to stand from standard chair; maintaining neutral spine; 2x8    In parallel bars: Dynamic forward march with contralateral knee touch; 4x D/B length of bars Adjacent to single parallel bar, seated on blue physioball, alternating march with upright posture; 2x10 alternating Consecutive forward step-up to Airex pad, 3 Airex pads in // bars; x10 on each LE Semitandem stand on Airex pad; 2x30sec     Supine on treatment table:             Supine dead bug with alternating knee extension/shoulder flexion; 2x10 alternating  Piriformis stretch; 20x, 1 sec         *not today* Active hamstring stretch (glider technique): 2x10 on each LE Gait with SPC following demo from PT; ambulate around gym x 1 lap with VC for cane/foot sequencing NuStep, seat at 10 and arms at 11; Level 3 resistance; 5 minutes  Dynamic march 3x D/B length of // bars Standing march with bilateral light touch on single parallel bar for complete weight shift to each lower limb; x10 alternating Seated hip abduction with Grn Tband, with abdominal brace and upright posture/neutral spine; 2x10 Abdominal brace with Bridge c neutral spine,  full range; 2x10 Abdominal brace with alternating march and shoulder flexion; 2x10 alternating R/L Standing toe tap onto  6-inch step; 2x10 alternating R/L for weight shifting and weight acceptance onto L lower limb Standing heel raise;  2x10 with bilat support on front-wheeled walker Abdominal brace/TrA contraction with alternating adductor ball squeeze/hip ABD iso against belt; 2x10, 5 sec  6-minute walk with front-wheel walker Abdominal brace/TrA contraction; x10, 5 sec Glute set; 2x10, 5 sec           HOME EXERCISE PROGRAM: Access Code [email or web address]       ASSESSMENT:   CLINICAL IMPRESSION: Patient reports pain along R lower abdominal quadrant that is not present at arrival or during session today. This may be L lumbar flank pain associated with post-operative low back pain and possible persistent pain; however, pt does report this occurred within 30 minutes after eating. Discussed with pt discussing abdominal pain with MD if this persists to check for non-musculoskeletal cause of this pain. No tenderness with deep palpation of R lower abdomen today or rebound tenderness. Patient is progressing substantially with volume of standing exercise and ambulatory work tolerated in clinic. Pt will benefit from skilled physical therapy to address the noted deficits in order to increase safety with functional activities and allow pt to return to work and home duties.      OBJECTIVE IMPAIRMENTS Abnormal gait, decreased activity tolerance, decreased balance, decreased endurance, decreased mobility, difficulty walking, decreased ROM, decreased strength, decreased safety awareness, impaired flexibility, impaired sensation, postural dysfunction, and pain.    ACTIVITY LIMITATIONS carrying, lifting, bending, sitting, standing, squatting, stairs, and transfers   PARTICIPATION LIMITATIONS: cleaning, laundry, driving, shopping, community activity, occupation, and yard work   PERSONAL FACTORS Time since onset of injury/illness/exacerbation and 1-2 comorbidities: schizophrenia and anxiety  are also affecting patient's functional  outcome.    REHAB POTENTIAL: Good   CLINICAL DECISION MAKING: Evolving/moderate complexity   EVALUATION COMPLEXITY: Moderate     GOALS: Goals reviewed with patient? Yes   SHORT TERM GOALS: Target date: 01/21/2022   Patient will be independent in home exercise program to improve strength/mobility for better functional independence with ADLs. Baseline:   02/11/22: Pt is compliant with his HEP  Goal status: ACHIEVED   2.  Pt will ambulate 200+ feet using RW without LOB or LLE buckling to demonstrate progression towards improved ambulatory endurance.  Baseline: 130f .    02/11/22: Pt able to ambulate > 200 feet without LLE buckling Goal status: ACHIEVED   3.   Patient will deny any falls over past 4 weeks to demonstrate improved safety at home.  Baseline: 4 falls since surgery.   02/11/22: 2 falls in June  Goal status: NOT MET     LONG TERM GOALS: Target date: 03/04/2022   Patient will increase FOTO score to equal to or greater than 41 to demonstrate statistically significant improvement in mobility and quality of life.  Baseline: 01/07/22: 23/41.  7/11/2: 44/41 Goal status: ACHIEVED   2.   Patient (< 638years old) will complete five times sit to stand test in < 10 seconds indicating an increased LE strength and improved balance. Baseline: 01/07/22: 43 seconds.   02/11/22: 36.1 sec Goal status: IN PROGRESS   3.  Patient will increase 10 meter walk test to >1.092m as to improve gait speed for better community ambulation and to reduce fall risk. Baseline: 01/07/22: 0.41 m/s.   02/11/22: 0.65 m/s Goal status: IN PROGRESS   4.  Patient will increase six minute walk test distance to >1000 for progression to community ambulator and improve gait ability. Baseline: 01/14/22: 280 feet.  02/11/22: 600 feet.  Goal status: IN PROGRESS   5.  Patient will increase LLE gross strength  to 4/5 as to improve functional strength for independent gait, increased standing tolerance and increased ADL  ability. Baseline: 01/07/22: ranging 3-4/5, please see objective.   02/11/22: 4 to 4+/5 MMTs, see above.  Goal status: ACHIEVED         PLAN: PT FREQUENCY: 2x/week   PT DURATION: 8 weeks   PLANNED INTERVENTIONS: Therapeutic exercises, Therapeutic activity, Neuromuscular re-education, Balance training, Gait training, Patient/Family education, Joint mobilization, Stair training, DME instructions, Dry Needling, Cryotherapy, Moist heat, and Manual therapy.   PLAN FOR NEXT SESSION: LE strengthening, trunk stabilization, LE flexibility. Progress with increasing emphasis on standing/weightbearing activity and continued AD wean as able from [facility] to no AD. Modalities for pain modulation as needed.       Valentina Gu, PT, DPT #[id number]  Eilleen Kempf, PT 02/24/2022, 11:42 AM

## 2022-02-24 ENCOUNTER — Encounter: Payer: Self-pay | Admitting: Physical Therapy

## 2022-02-25 ENCOUNTER — Ambulatory Visit: Payer: Medicare Other | Admitting: Physical Therapy

## 2022-02-25 ENCOUNTER — Encounter: Payer: Self-pay | Admitting: Physical Therapy

## 2022-02-25 DIAGNOSIS — M6281 Muscle weakness (generalized): Secondary | ICD-10-CM

## 2022-02-25 DIAGNOSIS — R269 Unspecified abnormalities of gait and mobility: Secondary | ICD-10-CM

## 2022-02-25 DIAGNOSIS — R2681 Unsteadiness on feet: Secondary | ICD-10-CM

## 2022-02-25 DIAGNOSIS — Z981 Arthrodesis status: Secondary | ICD-10-CM

## 2022-02-25 DIAGNOSIS — M545 Low back pain, unspecified: Secondary | ICD-10-CM

## 2022-02-25 NOTE — Therapy (Signed)
OUTPATIENT PHYSICAL THERAPY TREATMENT NOTE   Patient Name: Justin Nixon MRN: [id number] DOB:September 27, 1975, 46 y.o., male Today's Date: 02/25/2022  PCP: Baxter Hire, MD REFERRING PROVIDER: Meade Maw, MD  END OF SESSION:   PT End of Session - 02/25/22 0856     Visit Number 14    Number of Visits 17    Date for PT Re-Evaluation 03/04/22    Progress Note Due on Visit 10    PT Start Time 0857    PT Stop Time 0946    PT Time Calculation (min) 49 min    Equipment Utilized During Treatment Gait belt    Activity Tolerance Patient limited by fatigue;Patient limited by pain    Behavior During Therapy WFL for tasks assessed/performed             Past Medical History:  Diagnosis Date   Anxiety    Arthritis    GERD (gastroesophageal reflux disease)    H/O   Hyperlipidemia    Schizophrenia ([place])    Past Surgical History:  Procedure Laterality Date   ANTERIOR LATERAL LUMBAR FUSION WITH PERCUTANEOUS SCREW 1 LEVEL N/A 11/18/2021   Procedure: L4-5 LATERAL LUMBAR INTERBODY FUSION WITH POSTERIOR SPINAL FUSION;  Surgeon: Meade Maw, MD;  Location: [facility];  Service: Neurosurgery;  Laterality: N/A;   APPLICATION OF INTRAOPERATIVE CT SCAN N/A 11/18/2021   Procedure: APPLICATION OF INTRAOPERATIVE CT SCAN;  Surgeon: Meade Maw, MD;  Location: [facility];  Service: Neurosurgery;  Laterality: N/A;   COLONOSCOPY     CYST EXCISION     ELBOW ARTHROSCOPY Left 10/10/2020   Procedure: LEFT ELBOW ARTHROSCOPY WITH DEBRIDEMENT AND EXCISION OF LOOSE BODIES;  Surgeon: Corky Mull, MD;  Location: [facility];  Service: Orthopedics;  Laterality: Left;   MYRINGOTOMY WITH TUBE PLACEMENT     ORIF ELBOW FRACTURE Left 08/28/2021   Procedure: OPEN REDUCTION INTERNAL FIXATION (ORIF) ELBOW/OLECRANON FRACTURE;  Surgeon: Corky Mull, MD;  Location: [facility];  Service: Orthopedics;  Laterality: Left;   SHOULDER ARTHROSCOPY Left 03/18/2017   Procedure: ARTHROSCOPY SHOULDER with debridement  decompression and distal clavicle excision;  Surgeon: Corky Mull, MD;  Location: [place];  Service: Orthopedics;  Laterality: Left;   SHOULDER ARTHROSCOPY WITH OPEN ROTATOR CUFF REPAIR Right 09/23/2016   Procedure: SHOULDER ARTHROSCOPY WITH OPEN ROTATOR CUFF REPAIR;  Surgeon: Corky Mull, MD;  Location: [facility];  Service: Orthopedics;  Laterality: Right;   SHOULDER ARTHROSCOPY WITH SUBACROMIAL DECOMPRESSION Right 09/23/2016   Procedure: SHOULDER ARTHROSCOPY WITH SUBACROMIAL DECOMPRESSION AND DEBRIDEMENT;  Surgeon: Corky Mull, MD;  Location: [facility];  Service: Orthopedics;  Laterality: Right;   Patient Active Problem List   Diagnosis Date Noted   S/P spinal fusion 11/18/2021   Olecranon fracture, left, open type I or II, initial encounter 08/28/2021      REFERRING DIAG: Z98.1 (ICD-10-CM) - History of lumbar fusion   THERAPY DIAG:  Muscle weakness (generalized)   S/P lumbar fusion   Abnormality of gait and mobility   Unsteadiness on feet   Rationale for Evaluation and Treatment Rehabilitation   PERTINENT HISTORY: Pt presents to physical therapy for evaluation of low back and LE pain and weakness L>R, following L4-L5 XLIF/PSF on 11/18/21. Pt states he was using a cane before surgery due to onset of LLE weakness. States he has now been using a RW since surgery. Pain ranges from a 5/10 to a 10/10. When aggravated pain is described as sharp and shooting into LLE; at baseline, pain  is achy. Wearing clothes such as jeans is painful due to sensitivity of skin; at home wears pajamas. Pt has not returned to driving; he is limited to household distance ambulation or maximum into a doctors office. He feels as though his LLE is going to "give out" when he is walking or weightbearing. 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 Reports frequent falling since surgery due to LLE  giving out; no injuries associated with falls.    PRECAUTIONS: Back - no bending, limited twisting, no lifting > 25#. Per physician, after 12 weeks post-op, progress activity as tolerated.      OBJECTIVE: (objective measures completed at initial evaluation unless otherwise dated)   DIAGNOSTIC FINDINGS:  "Intraoperative images during L4-L5 anterior and posterior fusion. No evidence of immediate complication"   PATIENT SURVEYS:  Modified Oswestry (updated 01/09/22 due to incomplete form): 62%.   02/11/22: 48% FOTO 23/41   SCREENING FOR RED FLAGS: Bowel or bladder incontinence: No Spinal tumors: No Cauda equina syndrome: No Compression fracture: No Abdominal aneurysm: No   COGNITION:           Overall cognitive status: Within functional limits for tasks assessed                          SENSATION: Light touch: Impaired  - increased sensitivity in left L2 and L3 dermatomes    MUSCLE LENGTH: Hamstrings: unable to test due to increase in sharp/shooting pain in hip flexors prior to HS stretch being felt.  (01/28/22): R lacking 30 deg, L lacking 40 deg.   Thomas test: NT   POSTURE: decreased lumbar lordosis and posterior pelvic tilt   PALPATION: Right: hip flexors and lateral hip, glute med, piriformis, deep external rotators, QL Left: hip flexors, lateral hip and adductors (from proximal hip to mid-thigh), proximal hamstrings, L4-distal paraspinals, QL, all gluteals   OBSERVATION 01/21/22: No bruising, ecchymosis, increased tissue temperature, or erythema observed along L flank, L gluteal region, or adjacent to surgical incisions. No sign of infection or delayed healing   LUMBAR ROM: not assessed due to back precautions    Active  A/PROM  eval  Flexion -  Extension -  Right lateral flexion -  Left lateral flexion -  Right rotation -  Left rotation -   (Blank rows = not tested)   LOWER EXTREMITY ROM:      Passive  Right eval Left eval  Hip flexion 100 90  Hip extension NT  NT  Hip abduction 20 20  Hip adduction      Hip internal rotation      Hip external rotation      Knee flexion [facility]  Knee extension [facility]  Ankle dorsiflexion [facility]  Ankle plantarflexion      Ankle inversion      Ankle eversion       (Blank rows = not tested)   LOWER EXTREMITY MMT:     MMT Right eval Left eval Right 02/11/22 Left 02/11/22  Hip flexion 4-* 4-* 4+ 4*  Hip extension          Hip abduction 4+* 4-* 4+ 4+  Hip adduction 4* 4* 4+ 4+  Hip internal rotation 5 3* 5 4*  Hip external rotation 5 4* 5 4*  Knee flexion 5 4* 5 4+  Knee extension 5 4+ 5 4  Ankle dorsiflexion 4+  3+ 5 4  Ankle plantarflexion           (Blank rows = not tested)     FUNCTIONAL TESTS:  5 times sit to stand: 43.37 seconds using arms.    02/11/22: 36.1 seconds 6 minute walk test (performed 01/14/22): 280 feet.     02/11/22: 600 feet 10 meter walk test: 24.3 seconds (comfortable) = 0.41 m/s using RW; terminated fast-pace for safety.   02/11/22: 0.64 m/s comfortable speed, 0.65 m/s fast speed   STS mechanics: no UE support, use of momentum with rapid hip hinge to initiate sit to stand. Intermittent LLE buckling and varus/valgus immediately following initiation of sit to stand   GAIT: Distance walked: 600 feet Assistive device utilized: Environmental consultant - 2 wheeled Level of assistance: standby assist Comments: good heel to toe pattern, bilateral UE support on FWW, intermittent LLE uncontrolled knee flexion/buckling requiring heavy UE support and pause in gait prior to continuing c walking       TODAY'S TREATMENT    SUBJECTIVE: Patient reports his L hip is not bothering him at arrival. He reports pain in his L lower abdominal quadrant and L flank that is worse 30 min after eating. Patient reports pain in L lower lumbar paraspinal region with prolonged sitting on his couch last night. He reports still having some buckling of his LLE; he reports no major issues after his last visit. Pt reports having  regular BM, sometimes some stinging on L side during BM.     PAIN:  Are you having pain? Yes, 5/10 pain at arrival.  Pain location: Pain in L thigh and knee, L hip        Manual Therapy - for symptom modulation, soft tissue sensitivity and mobility, joint mobility, ROM    *Manual therapy not performed today* STM and IASTM with [employer]: L quadratus lumborum, L erector spinae L3-5, L gluteal musculature         Therapeutic Exercise - for improved soft tissue flexibility and extensibility as needed for ROM, improved strength as needed to improve performance of CKC activities/functional movements   PATIENT EDUCATION: Recommended follow up with physician/urgent care for possible GI issue that is outside of PT scope. Discussed pt seeking immediate medical attention if experiencing severe debilitating abdominal pain.     NuStep; Level 3; seat at 11, 5 minutes - subjective information gathered periodically during this time, 2 minutes not billed    In [place] with no AD:  Forward/backward stepping with therapist guarding patient posteriorly in case of LLE buckling, x 4 D/B 30-ft course Sidestep with minimal upper limb support, careful guarding with intermittent LLE buckling; x4 D/B 30-ft course   Sit to stand from standard chair; maintaining neutral spine; 1x10 and 1x8     In parallel bars: Dynamic forward march with contralateral knee touch; 4x D/B length of bars Adjacent to single parallel bar, seated on blue physioball, alternating shoulder and hip flexion (opposite sides) with upright posture; 2x10 alternating Consecutive forward step-up to Airex pad, 3 Airex pads in // bars; 4x D/B Full Tandem stand on Airex pad; 2x30sec     Supine on treatment table:             Supine dead bug with alternating knee extension/shoulder flexion; 2x10 alternating                   *not today*      Piriformis stretch; 20x, 1 sec Active hamstring stretch (glider technique): 2x10 on each  LE Gait with SPC following demo from PT; ambulate around gym x 1 lap with VC for cane/foot sequencing NuStep, seat at 10 and arms at 11; Level 3 resistance; 5 minutes  Dynamic march 3x D/B length of // bars Standing march with bilateral light touch on single parallel bar for complete weight shift to each lower limb; x10 alternating Seated hip abduction with Grn Tband, with abdominal brace and upright posture/neutral spine; 2x10 Abdominal brace with Bridge c neutral spine,  full range; 2x10 Abdominal brace with alternating march and shoulder flexion; 2x10 alternating R/L Standing toe tap onto 6-inch step; 2x10 alternating R/L for weight shifting and weight acceptance onto L lower limb Standing heel raise; 2x10 with bilat support on front-wheeled walker Abdominal brace/TrA contraction with alternating adductor ball squeeze/hip ABD iso against belt; 2x10, 5 sec  6-minute walk with front-wheel walker Abdominal brace/TrA contraction; x10, 5 sec Glute set; 2x10, 5 sec           HOME EXERCISE PROGRAM: Access Code [email or web address]       ASSESSMENT:   CLINICAL IMPRESSION: Patient has ongoing R lower abdominal quadrant pain that is worse about a half hour after eating and does hurt some with BM. Pt is still having regular BM and does experience this intermittently. This has bothered him over the weekend and has not been self-limiting. Recommended follow up with his physician and urgent care to assess for possible GI involvement. Patient has substantially increased volume of standing/walking and demonstrates good gait pattern at this time with symmetrical stance time and normal heel strike at initial contact bilaterally. Pt will benefit from skilled physical therapy to address the noted deficits in order to increase safety with functional activities and allow pt to return to work and home duties.      OBJECTIVE IMPAIRMENTS Abnormal gait, decreased activity tolerance, decreased balance, decreased  endurance, decreased mobility, difficulty walking, decreased ROM, decreased strength, decreased safety awareness, impaired flexibility, impaired sensation, postural dysfunction, and pain.    ACTIVITY LIMITATIONS carrying, lifting, bending, sitting, standing, squatting, stairs, and transfers   PARTICIPATION LIMITATIONS: cleaning, laundry, driving, shopping, community activity, occupation, and yard work   PERSONAL FACTORS Time since onset of injury/illness/exacerbation and 1-2 comorbidities: schizophrenia and anxiety  are also affecting patient's functional outcome.    REHAB POTENTIAL: Good   CLINICAL DECISION MAKING: Evolving/moderate complexity   EVALUATION COMPLEXITY: Moderate     GOALS: Goals reviewed with patient? Yes   SHORT TERM GOALS: Target date: 01/21/2022   Patient will be independent in home exercise program to improve strength/mobility for better functional independence with ADLs. Baseline:   02/11/22: Pt is compliant with his HEP  Goal status: ACHIEVED   2.  Pt will ambulate 200+ feet using RW without LOB or LLE buckling to demonstrate progression towards improved ambulatory endurance.  Baseline: 187f .    02/11/22: Pt able to ambulate > 200 feet without LLE buckling Goal status: ACHIEVED   3.   Patient will deny any falls over past 4 weeks to demonstrate improved safety at home.  Baseline: 4 falls since surgery.   02/11/22: 2 falls in June  Goal status: NOT MET     LONG TERM GOALS: Target date: 03/04/2022   Patient will increase FOTO score to equal to or greater than 41 to demonstrate statistically significant improvement in mobility and quality of life.  Baseline: 01/07/22: 23/41.  7/11/2: 44/41 Goal status: ACHIEVED   2.   Patient (< 669years old) will complete five times sit  to stand test in < 10 seconds indicating an increased LE strength and improved balance. Baseline: 01/07/22: 43 seconds.   02/11/22: 36.1 sec Goal status: IN PROGRESS   3.  Patient will increase 10  meter walk test to >1.44ms as to improve gait speed for better community ambulation and to reduce fall risk. Baseline: 01/07/22: 0.41 m/s.   02/11/22: 0.65 m/s Goal status: IN PROGRESS   4.  Patient will increase six minute walk test distance to >1000 for progression to community ambulator and improve gait ability. Baseline: 01/14/22: 280 feet.  02/11/22: 600 feet.  Goal status: IN PROGRESS   5.  Patient will increase LLE gross strength to 4/5 as to improve functional strength for independent gait, increased standing tolerance and increased ADL ability. Baseline: 01/07/22: ranging 3-4/5, please see objective.   02/11/22: 4 to 4+/5 MMTs, see above.  Goal status: ACHIEVED         PLAN: PT FREQUENCY: 2x/week   PT DURATION: 8 weeks   PLANNED INTERVENTIONS: Therapeutic exercises, Therapeutic activity, Neuromuscular re-education, Balance training, Gait training, Patient/Family education, Joint mobilization, Stair training, DME instructions, Dry Needling, Cryotherapy, Moist heat, and Manual therapy.   PLAN FOR NEXT SESSION: LE strengthening, trunk stabilization, LE flexibility. Progress with increasing emphasis on standing/weightbearing activity and continued AD [facility]. Modalities for pain modulation as needed.     JValentina Gu PT, DPT ##[id number] JEilleen Kempf PT 02/25/2022, 9:55 AM

## 2022-02-27 ENCOUNTER — Ambulatory Visit: Payer: Medicare Other | Admitting: Physical Therapy

## 2022-02-27 DIAGNOSIS — R2681 Unsteadiness on feet: Secondary | ICD-10-CM

## 2022-02-27 DIAGNOSIS — R269 Unspecified abnormalities of gait and mobility: Secondary | ICD-10-CM

## 2022-02-27 DIAGNOSIS — M6281 Muscle weakness (generalized): Secondary | ICD-10-CM | POA: Diagnosis not present

## 2022-02-27 DIAGNOSIS — Z981 Arthrodesis status: Secondary | ICD-10-CM

## 2022-02-27 DIAGNOSIS — M545 Low back pain, unspecified: Secondary | ICD-10-CM

## 2022-02-27 NOTE — Therapy (Signed)
OUTPATIENT PHYSICAL THERAPY TREATMENT NOTE   Patient Name: Justin Nixon MRN: [id number] DOB:1975-10-09, 46 y.o., male Today's Date: 02/27/2022  PCP: Baxter Hire, MD  REFERRING PROVIDER: Meade Maw, MD  END OF SESSION:   PT End of Session - 02/27/22 0858     Visit Number 15    Number of Visits 17    Date for PT Re-Evaluation 03/04/22    Progress Note Due on Visit 10    PT Start Time 0859    PT Stop Time 0944    PT Time Calculation (min) 45 min    Equipment Utilized During Treatment Gait belt    Activity Tolerance Patient limited by fatigue;Patient limited by pain    Behavior During Therapy WFL for tasks assessed/performed             Past Medical History:  Diagnosis Date   Anxiety    Arthritis    GERD (gastroesophageal reflux disease)    H/O   Hyperlipidemia    Schizophrenia ([place])    Past Surgical History:  Procedure Laterality Date   ANTERIOR LATERAL LUMBAR FUSION WITH PERCUTANEOUS SCREW 1 LEVEL N/A 11/18/2021   Procedure: L4-5 LATERAL LUMBAR INTERBODY FUSION WITH POSTERIOR SPINAL FUSION;  Surgeon: Meade Maw, MD;  Location: [facility];  Service: Neurosurgery;  Laterality: N/A;   APPLICATION OF INTRAOPERATIVE CT SCAN N/A 11/18/2021   Procedure: APPLICATION OF INTRAOPERATIVE CT SCAN;  Surgeon: Meade Maw, MD;  Location: [facility];  Service: Neurosurgery;  Laterality: N/A;   COLONOSCOPY     CYST EXCISION     ELBOW ARTHROSCOPY Left 10/10/2020   Procedure: LEFT ELBOW ARTHROSCOPY WITH DEBRIDEMENT AND EXCISION OF LOOSE BODIES;  Surgeon: Corky Mull, MD;  Location: [facility];  Service: Orthopedics;  Laterality: Left;   MYRINGOTOMY WITH TUBE PLACEMENT     ORIF ELBOW FRACTURE Left 08/28/2021   Procedure: OPEN REDUCTION INTERNAL FIXATION (ORIF) ELBOW/OLECRANON FRACTURE;  Surgeon: Corky Mull, MD;  Location: [facility];  Service: Orthopedics;  Laterality: Left;   SHOULDER ARTHROSCOPY Left 03/18/2017   Procedure: ARTHROSCOPY SHOULDER with debridement  decompression and distal clavicle excision;  Surgeon: Corky Mull, MD;  Location: [facility];  Service: Orthopedics;  Laterality: Left;   SHOULDER ARTHROSCOPY WITH OPEN ROTATOR CUFF REPAIR Right 09/23/2016   Procedure: SHOULDER ARTHROSCOPY WITH OPEN ROTATOR CUFF REPAIR;  Surgeon: Corky Mull, MD;  Location: [facility];  Service: Orthopedics;  Laterality: Right;   SHOULDER ARTHROSCOPY WITH SUBACROMIAL DECOMPRESSION Right 09/23/2016   Procedure: SHOULDER ARTHROSCOPY WITH SUBACROMIAL DECOMPRESSION AND DEBRIDEMENT;  Surgeon: Corky Mull, MD;  Location: [facility];  Service: Orthopedics;  Laterality: Right;   Patient Active Problem List   Diagnosis Date Noted   S/P spinal fusion 11/18/2021   Olecranon fracture, left, open type I or II, initial encounter 08/28/2021        REFERRING DIAG: Z98.1 (ICD-10-CM) - History of lumbar fusion   THERAPY DIAG:  Muscle weakness (generalized)   S/P lumbar fusion   Abnormality of gait and mobility   Unsteadiness on feet   Rationale for Evaluation and Treatment Rehabilitation   PERTINENT HISTORY: Pt presents to physical therapy for evaluation of low back and LE pain and weakness L>R, following L4-L5 XLIF/PSF on 11/18/21. Pt states he was using a cane before surgery due to onset of LLE weakness. States he has now been using a RW since surgery. Pain ranges from a 5/10 to a 10/10. When aggravated pain is described as sharp and shooting into LLE;  at baseline, pain is achy. Wearing clothes such as jeans is painful due to sensitivity of skin; at home wears pajamas. Pt has not returned to driving; he is limited to household distance ambulation or maximum into a doctors office. He feels as though his LLE is going to "give out" when he is walking or weightbearing. 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 Reports frequent falling since surgery due to LLE  giving out; no injuries associated with falls.    PRECAUTIONS: Back - no bending, limited twisting, no lifting > 25#. Per physician, after 12 weeks post-op, progress activity as tolerated.      OBJECTIVE: (objective measures completed at initial evaluation unless otherwise dated)   DIAGNOSTIC FINDINGS:  "Intraoperative images during L4-L5 anterior and posterior fusion. No evidence of immediate complication"   PATIENT SURVEYS:  Modified Oswestry (updated 01/09/22 due to incomplete form): 62%.   02/11/22: 48% FOTO 23/41   SCREENING FOR RED FLAGS: Bowel or bladder incontinence: No Spinal tumors: No Cauda equina syndrome: No Compression fracture: No Abdominal aneurysm: No   COGNITION:           Overall cognitive status: Within functional limits for tasks assessed                          SENSATION: Light touch: Impaired  - increased sensitivity in left L2 and L3 dermatomes    MUSCLE LENGTH: Hamstrings: unable to test due to increase in sharp/shooting pain in hip flexors prior to HS stretch being felt.  (01/28/22): R lacking 30 deg, L lacking 40 deg.   Thomas test: NT   POSTURE: decreased lumbar lordosis and posterior pelvic tilt   PALPATION: Right: hip flexors and lateral hip, glute med, piriformis, deep external rotators, QL Left: hip flexors, lateral hip and adductors (from proximal hip to mid-thigh), proximal hamstrings, L4-distal paraspinals, QL, all gluteals   OBSERVATION 01/21/22: No bruising, ecchymosis, increased tissue temperature, or erythema observed along L flank, L gluteal region, or adjacent to surgical incisions. No sign of infection or delayed healing   LUMBAR ROM: not assessed due to back precautions    Active  A/PROM  eval  Flexion -  Extension -  Right lateral flexion -  Left lateral flexion -  Right rotation -  Left rotation -   (Blank rows = not tested)   LOWER EXTREMITY ROM:      Passive  Right eval Left eval  Hip flexion 100 90  Hip extension NT  NT  Hip abduction 20 20  Hip adduction      Hip internal rotation      Hip external rotation      Knee flexion [facility]  Knee extension [facility]  Ankle dorsiflexion [facility]  Ankle plantarflexion      Ankle inversion      Ankle eversion       (Blank rows = not tested)   LOWER EXTREMITY MMT:     MMT Right eval Left eval Right 02/11/22 Left 02/11/22  Hip flexion 4-* 4-* 4+ 4*  Hip extension          Hip abduction 4+* 4-* 4+ 4+  Hip adduction 4* 4* 4+ 4+  Hip internal rotation 5 3* 5 4*  Hip external rotation 5 4* 5 4*  Knee flexion 5 4* 5 4+  Knee extension 5 4+ 5 4  Ankle dorsiflexion 4+ 3+ 5 4  Ankle plantarflexion           (Blank rows = not tested)     FUNCTIONAL TESTS:  5 times sit to stand: 43.37 seconds using arms.    02/11/22: 36.1 seconds 6 minute walk test (performed 01/14/22): 280 feet.     02/11/22: 600 feet 10 meter walk test: 24.3 seconds (comfortable) = 0.41 m/s using RW; terminated fast-pace for safety.   02/11/22: 0.64 m/s comfortable speed, 0.65 m/s fast speed   STS mechanics: no UE support, use of momentum with rapid hip hinge to initiate sit to stand. Intermittent LLE buckling and varus/valgus immediately following initiation of sit to stand   GAIT: Distance walked: 600 feet Assistive device utilized: Environmental consultant - 2 wheeled Level of assistance: standby assist Comments: good heel to toe pattern, bilateral UE support on FWW, intermittent LLE uncontrolled knee flexion/buckling requiring heavy UE support and pause in gait prior to continuing c walking       TODAY'S TREATMENT    SUBJECTIVE: Patient states he has not yet been seen for R lower abdominal pain. He reports having pain after eating still at this time. He reports minimal pain in back/hip at arrival. He reports that symptoms at waist and L hip are intermittent. He reports ongoing sensitivity affecting L hip/proximal lateral thigh mainly - he reports minimal sensitivity affecting distal L lateral thigh.       PAIN:  Are you having pain? No Pain location: Pain in L thigh and knee, L hip        Manual Therapy - for symptom modulation, soft tissue sensitivity and mobility, joint mobility, ROM    *Manual therapy not performed today* STM and IASTM with Hypervolt: L quadratus lumborum, L erector spinae L3-5, L gluteal musculature         Therapeutic Exercise - for improved soft tissue flexibility and extensibility as needed for ROM, improved strength as needed to improve performance of CKC activities/functional movements     PATIENT EDUCATION: Recommended follow up with physician/urgent care for possible GI issue that is outside of PT scope.     SciFit seated elliptical; Level 4; seat at 8, 5 minutes - subjective information gathered periodically during this time, 3 minutes not billed     In [place] with no AD:  Forward/backward stepping with therapist guarding patient posteriorly in case of LLE buckling, x 4 D/B 30-ft course Sidestep with minimal upper limb support, careful guarding with intermittent LLE buckling; x4 D/B 30-ft course   Sit to stand from standard chair; maintaining neutral spine; 2x10     In parallel bars: Dynamic forward march with contralateral knee touch; 4x D/B length of bars 3 6-inch hurdles consecutive step-over; step-to pattern x 2 D/B, reciprocal pattern x2 D/B Adjacent to single parallel bar, seated on blue physioball, alternating shoulder and hip flexion (opposite sides) with upright posture; 2x10 alternating  Full Tandem stand on Airex pad; 2x30sec     Supine on treatment table:             Supine dead bug with alternating knee extension/shoulder flexion; 2x10 alternating Bird dog; quadruped; 1x8 alternating                   *not today* Consecutive forward step-up to Airex pad, 3 Airex pads in // bars; 4x D/B                 Piriformis stretch; 20x, 1 sec Active hamstring stretch (glider  technique): 2x10 on each LE Gait with SPC following demo  from PT; ambulate around gym x 1 lap with VC for cane/foot sequencing NuStep, seat at 10 and arms at 11; Level 3 resistance; 5 minutes  Dynamic march 3x D/B length of // bars Standing march with bilateral light touch on single parallel bar for complete weight shift to each lower limb; x10 alternating Seated hip abduction with Grn Tband, with abdominal brace and upright posture/neutral spine; 2x10 Abdominal brace with Bridge c neutral spine,  full range; 2x10 Abdominal brace with alternating march and shoulder flexion; 2x10 alternating R/L Standing toe tap onto 6-inch step; 2x10 alternating R/L for weight shifting and weight acceptance onto L lower limb Standing heel raise; 2x10 with bilat support on front-wheeled walker Abdominal brace/TrA contraction with alternating adductor ball squeeze/hip ABD iso against belt; 2x10, 5 sec  6-minute walk with front-wheel walker Abdominal brace/TrA contraction; x10, 5 sec Glute set; 2x10, 5 sec           HOME EXERCISE PROGRAM: Access Code [email or web address]       ASSESSMENT:   CLINICAL IMPRESSION: Patient has ongoing R lower abdominal quadrant pain that is worse about a half hour after eating and does hurt some with BM. Pt is still having regular BM and does experience this intermittently. This has bothered him over the weekend and has not been self-limiting. Recommended follow up with his physician and urgent care to assess for possible GI involvement. Patient has substantially increased volume of standing/walking and demonstrates good gait pattern at this time with symmetrical stance time and normal heel strike at initial contact bilaterally. Pt will benefit from skilled physical therapy to address the noted deficits in order to increase safety with functional activities and allow pt to return to work and home duties.      OBJECTIVE IMPAIRMENTS Abnormal gait, decreased activity tolerance, decreased balance, decreased endurance, decreased mobility, difficulty  walking, decreased ROM, decreased strength, decreased safety awareness, impaired flexibility, impaired sensation, postural dysfunction, and pain.    ACTIVITY LIMITATIONS carrying, lifting, bending, sitting, standing, squatting, stairs, and transfers   PARTICIPATION LIMITATIONS: cleaning, laundry, driving, shopping, community activity, occupation, and yard work   PERSONAL FACTORS Time since onset of injury/illness/exacerbation and 1-2 comorbidities: schizophrenia and anxiety  are also affecting patient's functional outcome.    REHAB POTENTIAL: Good   CLINICAL DECISION MAKING: Evolving/moderate complexity   EVALUATION COMPLEXITY: Moderate     GOALS: Goals reviewed with patient? Yes   SHORT TERM GOALS: Target date: 01/21/2022   Patient will be independent in home exercise program to improve strength/mobility for better functional independence with ADLs. Baseline:   02/11/22: Pt is compliant with his HEP  Goal status: ACHIEVED   2.  Pt will ambulate 200+ feet using RW without LOB or LLE buckling to demonstrate progression towards improved ambulatory endurance.  Baseline: 122ft .    02/11/22: Pt able to ambulate > 200 feet without LLE buckling Goal status: ACHIEVED   3.   Patient will deny any falls over past 4 weeks to demonstrate improved safety at home.  Baseline: 4 falls since surgery.   02/11/22: 2 falls in June  Goal status: NOT MET     LONG TERM GOALS: Target date: 03/04/2022   Patient will increase FOTO score to equal to or greater than 41 to demonstrate statistically significant improvement in mobility and quality of life.  Baseline: 01/07/22: 23/41.  7/11/2: 44/41 Goal status: ACHIEVED   2.   Patient (< 38 years old)  will complete five times sit to stand test in < 10 seconds indicating an increased LE strength and improved balance. Baseline: 01/07/22: 43 seconds.   02/11/22: 36.1 sec Goal status: IN PROGRESS   3.  Patient will increase 10 meter walk test to >1.77m/s as to improve  gait speed for better community ambulation and to reduce fall risk. Baseline: 01/07/22: 0.41 m/s.   02/11/22: 0.65 m/s Goal status: IN PROGRESS   4.  Patient will increase six minute walk test distance to >1000 for progression to community ambulator and improve gait ability. Baseline: 01/14/22: 280 feet.  02/11/22: 600 feet.  Goal status: IN PROGRESS   5.  Patient will increase LLE gross strength to 4/5 as to improve functional strength for independent gait, increased standing tolerance and increased ADL ability. Baseline: 01/07/22: ranging 3-4/5, please see objective.   02/11/22: 4 to 4+/5 MMTs, see above.  Goal status: ACHIEVED         PLAN: PT FREQUENCY: 2x/week   PT DURATION: 8 weeks   PLANNED INTERVENTIONS: Therapeutic exercises, Therapeutic activity, Neuromuscular re-education, Balance training, Gait training, Patient/Family education, Joint mobilization, Stair training, DME instructions, Dry Needling, Cryotherapy, Moist heat, and Manual therapy.   PLAN FOR NEXT SESSION: LE strengthening, trunk stabilization, LE flexibility. Progress with increasing emphasis on standing/weightbearing activity and continued AD wean as able from [facility] to no AD. Modalities for pain modulation as needed.       THIS NOTE IS INCOMPLETE, PLEASE DO NOT REFERENCE FOR INFORMATION Valentina Gu, PT, DPT #[id number]  Eilleen Kempf, PT 02/27/2022, 8:59 AM

## 2022-03-04 ENCOUNTER — Ambulatory Visit: Payer: Medicare Other | Attending: Neurosurgery | Admitting: Physical Therapy

## 2022-03-04 DIAGNOSIS — M545 Low back pain, unspecified: Secondary | ICD-10-CM | POA: Diagnosis present

## 2022-03-04 DIAGNOSIS — R2681 Unsteadiness on feet: Secondary | ICD-10-CM | POA: Diagnosis present

## 2022-03-04 DIAGNOSIS — M6281 Muscle weakness (generalized): Secondary | ICD-10-CM | POA: Insufficient documentation

## 2022-03-04 DIAGNOSIS — Z981 Arthrodesis status: Secondary | ICD-10-CM | POA: Diagnosis present

## 2022-03-04 DIAGNOSIS — R269 Unspecified abnormalities of gait and mobility: Secondary | ICD-10-CM | POA: Diagnosis present

## 2022-03-04 NOTE — Therapy (Unsigned)
OUTPATIENT PHYSICAL THERAPY TREATMENT NOTE/GOAL UPDATE AND RE-CERTIFICATION   Patient Name: Justin Nixon MRN: [id number] DOB:November 06, 1975, 46 y.o., male Today's Date: 03/05/2022  PCP: Baxter Hire, MD REFERRING PROVIDER: Meade Maw, MD  END OF SESSION:   PT End of Session - 03/04/22 0909     Visit Number 16    Number of Visits 25    Date for PT Re-Evaluation 04/03/22    Progress Note Due on Visit 10    PT Start Time 0903    PT Stop Time 0945    PT Time Calculation (min) 42 min    Equipment Utilized During Treatment Gait belt    Activity Tolerance Patient limited by fatigue;Patient limited by pain    Behavior During Therapy WFL for tasks assessed/performed             Past Medical History:  Diagnosis Date   Anxiety    Arthritis    GERD (gastroesophageal reflux disease)    H/O   Hyperlipidemia    Schizophrenia ([place])    Past Surgical History:  Procedure Laterality Date   ANTERIOR LATERAL LUMBAR FUSION WITH PERCUTANEOUS SCREW 1 LEVEL N/A 11/18/2021   Procedure: L4-5 LATERAL LUMBAR INTERBODY FUSION WITH POSTERIOR SPINAL FUSION;  Surgeon: Meade Maw, MD;  Location: [facility];  Service: Neurosurgery;  Laterality: N/A;   APPLICATION OF INTRAOPERATIVE CT SCAN N/A 11/18/2021   Procedure: APPLICATION OF INTRAOPERATIVE CT SCAN;  Surgeon: Meade Maw, MD;  Location: [facility];  Service: Neurosurgery;  Laterality: N/A;   COLONOSCOPY     CYST EXCISION     ELBOW ARTHROSCOPY Left 10/10/2020   Procedure: LEFT ELBOW ARTHROSCOPY WITH DEBRIDEMENT AND EXCISION OF LOOSE BODIES;  Surgeon: Corky Mull, MD;  Location: [facility];  Service: Orthopedics;  Laterality: Left;   MYRINGOTOMY WITH TUBE PLACEMENT     ORIF ELBOW FRACTURE Left 08/28/2021   Procedure: OPEN REDUCTION INTERNAL FIXATION (ORIF) ELBOW/OLECRANON FRACTURE;  Surgeon: Corky Mull, MD;  Location: [facility];  Service: Orthopedics;  Laterality: Left;   SHOULDER ARTHROSCOPY Left 03/18/2017   Procedure: ARTHROSCOPY  SHOULDER with debridement decompression and distal clavicle excision;  Surgeon: Corky Mull, MD;  Location: [place];  Service: Orthopedics;  Laterality: Left;   SHOULDER ARTHROSCOPY WITH OPEN ROTATOR CUFF REPAIR Right 09/23/2016   Procedure: SHOULDER ARTHROSCOPY WITH OPEN ROTATOR CUFF REPAIR;  Surgeon: Corky Mull, MD;  Location: [facility];  Service: Orthopedics;  Laterality: Right;   SHOULDER ARTHROSCOPY WITH SUBACROMIAL DECOMPRESSION Right 09/23/2016   Procedure: SHOULDER ARTHROSCOPY WITH SUBACROMIAL DECOMPRESSION AND DEBRIDEMENT;  Surgeon: Corky Mull, MD;  Location: [facility];  Service: Orthopedics;  Laterality: Right;   Patient Active Problem List   Diagnosis Date Noted   S/P spinal fusion 11/18/2021   Olecranon fracture, left, open type I or II, initial encounter 08/28/2021       REFERRING DIAG: Z98.1 (ICD-10-CM) - History of lumbar fusion   THERAPY DIAG:  Muscle weakness (generalized)   S/P lumbar fusion   Abnormality of gait and mobility   Unsteadiness on feet   Rationale for Evaluation and Treatment Rehabilitation   PERTINENT HISTORY: Pt presents to physical therapy for evaluation of low back and LE pain and weakness L>R, following L4-L5 XLIF/PSF on 11/18/21. Pt states he was using a cane before surgery due to onset of LLE weakness. States he has now been using a RW since surgery. Pain ranges from a 5/10 to a 10/10. When aggravated pain is described as sharp and shooting into  LLE; at baseline, pain is achy. Wearing clothes such as jeans is painful due to sensitivity of skin; at home wears pajamas. Pt has not returned to driving; he is limited to household distance ambulation or maximum into a doctors office. He feels as though his LLE is going to "give out" when he is walking or weightbearing. 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 Reports frequent falling  since surgery due to LLE giving out; no injuries associated with falls.    PRECAUTIONS: Back - no bending, limited twisting, no lifting > 25#. Per physician, after 12 weeks post-op, progress activity as tolerated.      OBJECTIVE: (objective measures completed at initial evaluation unless otherwise dated)   DIAGNOSTIC FINDINGS:  "Intraoperative images during L4-L5 anterior and posterior fusion. No evidence of immediate complication"   PATIENT SURVEYS:  Modified Oswestry (updated 01/09/22 due to incomplete form): 62%.   02/11/22: 48%.  03/04/22: 28% FOTO: eval 23/41,   02/11/22: 44/41   SCREENING FOR RED FLAGS: Bowel or bladder incontinence: No Spinal tumors: No Cauda equina syndrome: No Compression fracture: No Abdominal aneurysm: No   COGNITION:           Overall cognitive status: Within functional limits for tasks assessed                          SENSATION: Light touch: Impaired  - increased sensitivity in left L2 and L3 dermatomes    MUSCLE LENGTH: Hamstrings: unable to test due to increase in sharp/shooting pain in hip flexors prior to HS stretch being felt.  (01/28/22): R lacking 30 deg, L lacking 40 deg.   Thomas test: NT   POSTURE: decreased lumbar lordosis and posterior pelvic tilt   PALPATION: Right: hip flexors and lateral hip, glute med, piriformis, deep external rotators, QL Left: hip flexors, lateral hip and adductors (from proximal hip to mid-thigh), proximal hamstrings, L4-distal paraspinals, QL, all gluteals   OBSERVATION 01/21/22: No bruising, ecchymosis, increased tissue temperature, or erythema observed along L flank, L gluteal region, or adjacent to surgical incisions. No sign of infection or delayed healing   LUMBAR ROM: not assessed due to back precautions    Active  A/PROM  eval  Flexion -  Extension -  Right lateral flexion -  Left lateral flexion -  Right rotation -  Left rotation -   (Blank rows = not tested)   LOWER EXTREMITY ROM:      Passive   Right eval Left eval  Hip flexion 100 90  Hip extension NT NT  Hip abduction 20 20  Hip adduction      Hip internal rotation      Hip external rotation      Knee flexion [facility]  Knee extension [facility]  Ankle dorsiflexion [facility]  Ankle plantarflexion      Ankle inversion      Ankle eversion       (Blank rows = not tested)   LOWER EXTREMITY MMT:     MMT Right eval Left eval Right 02/11/22 Left 02/11/22  Hip flexion 4-* 4-* 4+ 4*  Hip extension          Hip abduction 4+* 4-* 4+ 4+  Hip adduction 4* 4* 4+ 4+  Hip internal rotation 5 3* 5 4*  Hip external rotation 5 4* 5 4*  Knee flexion 5 4* 5  4+  Knee extension 5 4+ 5 4  Ankle dorsiflexion 4+ 3+ 5 4  Ankle plantarflexion           (Blank rows = not tested)     FUNCTIONAL TESTS:  5 times sit to stand: 43.37 seconds using arms.    02/11/22: 36.1 seconds.  03/04/22: 27.4 seconds.  6 minute walk test (performed 01/14/22): 280 feet.     02/11/22: 600 feet.  03/04/22: 735 feet. 10 meter walk test: 24.3 seconds (comfortable) = 0.41 m/s using RW; terminated fast-pace for safety.   02/11/22: 0.64 m/s comfortable speed, 0.65 m/s fast speed.  03/04/22: 0.65 m/s fast speed   STS mechanics: no UE support, use of momentum with rapid hip hinge to initiate sit to stand. Intermittent LLE buckling and varus/valgus immediately following initiation of sit to stand  -03/04/22: After 2 attempts pt able to perform sit to stand with no UE support, upward velocity of transfers slows down halfway through transfer with intermittent LLE varus/valgus   GAIT: Distance walked: 735 feet Assistive device utilized: SPC Level of assistance: standby assist Comments: good heel to toe pattern, intermittent LLE buckling requiring pt holding wall and intermittently placing hands on his L thigh for support; no assist required from PT to prevent fall        TODAY'S TREATMENT    SUBJECTIVE: Patient reports experiencing some R-sided low back pain Thursday night. He  reports pain along R side that feels similar to his pre-operative low back pain. He has not yet followed up with urgent care or primary physician regarding abdominal pain - he states this is improving. He reports L side is "light" at arrival. He reports intermittent feeling of "lightening strike" affecting his L leg/thigh. Pt reports 65% SANE score. He reports he needs continued improvement in L thigh/hip pain as well as LLE strength.      PAIN:  Are you having pain? Yes, 4/10 pain affecting his R flank, "light" left-sided back/hip pain Pain location: Pain in L thigh and knee, L hip        Manual Therapy - for symptom modulation, soft tissue sensitivity and mobility, joint mobility, ROM     STM and DTM: R and L quadratus lumborum, R>L iliocostalis lumborum x 8 minutes    Moist Hot Pack (unbilled) utilized  after manual therapy for analgesic effect and improved soft tissue extensibility, along low back in sitting x 5 minutes       Therapeutic Exercise - for improved soft tissue flexibility and extensibility as needed for ROM, improved strength as needed to improve performance of CKC activities/functional movements    SciFit seated elliptical; Level 4; seat at 8, 5 minutes - subjective information gathered periodically during this time, 3 minutes not billed   Lower trunk rotation; 2x10 alternating R/L   *Goal update performed*    *next visit* In Hallway with no AD:  Forward/backward stepping with therapist guarding patient posteriorly in case of LLE buckling, x 4 D/B 30-ft course Sidestep with minimal upper limb support, careful guarding with intermittent LLE buckling; x4 D/B 30-ft course  Sit to stand from standard chair; maintaining neutral spine; 2x10  In parallel bars: Dynamic forward march with contralateral knee touch; 4x D/B length of bars 3 6-inch hurdles consecutive step-over; step-to pattern x 2 D/B, reciprocal pattern x2 D/B Adjacent to single parallel bar, seated on  blue physioball, alternating shoulder and hip flexion (opposite sides) with upright posture; 2x10 alternating  Full Tandem stand on Airex pad;  2x30sec  On treatment table:             Supine dead bug with alternating knee extension/shoulder flexion; 2x10 alternating Bird dog; quadruped; 1x8 alternating                   *not today* Consecutive forward step-up to Airex pad, 3 Airex pads in // bars; 4x D/B                 Piriformis stretch; 20x, 1 sec Active hamstring stretch (glider technique): 2x10 on each LE Gait with SPC following demo from PT; ambulate around gym x 1 lap with VC for cane/foot sequencing NuStep, seat at 10 and arms at 11; Level 3 resistance; 5 minutes  Dynamic march 3x D/B length of // bars Standing march with bilateral light touch on single parallel bar for complete weight shift to each lower limb; x10 alternating Seated hip abduction with Grn Tband, with abdominal brace and upright posture/neutral spine; 2x10 Abdominal brace with Bridge c neutral spine,  full range; 2x10 Abdominal brace with alternating march and shoulder flexion; 2x10 alternating R/L Standing toe tap onto 6-inch step; 2x10 alternating R/L for weight shifting and weight acceptance onto L lower limb Standing heel raise; 2x10 with bilat support on front-wheeled walker Abdominal brace/TrA contraction with alternating adductor ball squeeze/hip ABD iso against belt; 2x10, 5 sec  6-minute walk with front-wheel walker Abdominal brace/TrA contraction; x10, 5 sec Glute set; 2x10, 5 sec           HOME EXERCISE PROGRAM: Access Code [email or web address]       ASSESSMENT:   CLINICAL IMPRESSION: Patient has yet to follow up with MD regarding L lower abdominal pain; this has improved, but follow-up is recommended to address possible gastrointestinal issue outside of scope of physical therapy. Patient has now met all short-term goals; no falls over the previous month. He has ongoing improvement in 5-time sit to  stand and 6-minute walk test; however, his gait speed remains unchanged. Pt does have ongoing mild to moderate pain affecting L flank and L hip; sensitivity along distal lateral L thigh has improved, but he still reports hypersensitivity along L hip. He is improving with independence with ambulation c LRAD. Pt needs continued work on full recovery of function and improved independence with community-level mobility. Pt will benefit from skilled physical therapy to address the noted deficits in order to increase safety with functional activities and allow pt to return to work and home duties.      OBJECTIVE IMPAIRMENTS Abnormal gait, decreased activity tolerance, decreased balance, decreased endurance, decreased mobility, difficulty walking, decreased ROM, decreased strength, decreased safety awareness, impaired flexibility, impaired sensation, postural dysfunction, and pain.    ACTIVITY LIMITATIONS carrying, lifting, bending, sitting, standing, squatting, stairs, and transfers   PARTICIPATION LIMITATIONS: cleaning, laundry, driving, shopping, community activity, occupation, and yard work   PERSONAL FACTORS Time since onset of injury/illness/exacerbation and 1-2 comorbidities: schizophrenia and anxiety  are also affecting patient's functional outcome.    REHAB POTENTIAL: Good   CLINICAL DECISION MAKING: Evolving/moderate complexity   EVALUATION COMPLEXITY: Moderate     GOALS: Goals reviewed with patient? Yes   SHORT TERM GOALS: Target date: 01/21/2022   Patient will be independent in home exercise program to improve strength/mobility for better functional independence with ADLs. Baseline:   02/11/22: Pt is compliant with his HEP  Goal status: ACHIEVED   2.  Pt will ambulate 200+ feet using RW without LOB or LLE buckling to demonstrate  progression towards improved ambulatory endurance.  Baseline: 187ft .    02/11/22: Pt able to ambulate > 200 feet without LLE buckling Goal status: ACHIEVED    3.   Patient will deny any falls over past 4 weeks to demonstrate improved safety at home.  Baseline: 4 falls since surgery.   02/11/22: 2 falls in June.   03/04/22: No falls in previous month.  Goal status: ACHIEVED     LONG TERM GOALS: Target date: 03/04/2022   Patient will increase FOTO score to equal to or greater than 41 to demonstrate statistically significant improvement in mobility and quality of life.  Baseline: 01/07/22: 23/41.  7/11/2: 44/41 Goal status: ACHIEVED   2.   Patient (< 10 years old) will complete five times sit to stand test in < 10 seconds indicating an increased LE strength and improved balance. Baseline: 01/07/22: 43 seconds.   02/11/22: 36.1 sec.  03/04/22: 27.4 seconds.  Goal status: IN PROGRESS   3.  Patient will increase 10 meter walk test to >1.29m/s as to improve gait speed for better community ambulation and to reduce fall risk. Baseline: 01/07/22: 0.41 m/s.   02/11/22: 0.65 m/s.    03/04/22: 0.65 m/s fast speed Goal status: IN PROGRESS/NO CHANGE FROM PREVIOUS RE-ASSESSMENT   4.  Patient will increase six minute walk test distance to >1000 for progression to community ambulator and improve gait ability. Baseline: 01/14/22: 280 feet.  02/11/22: 600 feet.   03/04/22: 735 feet. Goal status: IN PROGRESS   5.  Patient will increase LLE gross strength to 4/5 as to improve functional strength for independent gait, increased standing tolerance and increased ADL ability. Baseline: 01/07/22: ranging 3-4/5, please see objective.   02/11/22: 4 to 4+/5 MMTs, see above.  Goal status: ACHIEVED         PLAN: PT FREQUENCY: 2x/week   PT DURATION: 4-6 weeks   PLANNED INTERVENTIONS: Therapeutic exercises, Therapeutic activity, Neuromuscular re-education, Balance training, Gait training, Patient/Family education, Joint mobilization, Stair training, DME instructions, Dry Needling, Cryotherapy, Moist heat, and Manual therapy.   PLAN FOR NEXT SESSION: LE strengthening, trunk stabilization, LE  flexibility. Progress with increasing emphasis on standing/weightbearing activity and continued AD wean as able from [facility] to no AD. Modalities and manual therapy for pain modulation as needed. Recommend continued PT 2x/week for 4-6 weeks    Valentina Gu, PT, DPT #[id number]  Eilleen Kempf, PT 03/05/2022, 12:52 PM

## 2022-03-05 ENCOUNTER — Encounter: Payer: Self-pay | Admitting: Physical Therapy

## 2022-03-06 ENCOUNTER — Ambulatory Visit: Payer: Medicare Other | Admitting: Physical Therapy

## 2022-03-06 DIAGNOSIS — Z981 Arthrodesis status: Secondary | ICD-10-CM

## 2022-03-06 DIAGNOSIS — M6281 Muscle weakness (generalized): Secondary | ICD-10-CM

## 2022-03-06 DIAGNOSIS — R2681 Unsteadiness on feet: Secondary | ICD-10-CM

## 2022-03-06 DIAGNOSIS — M545 Low back pain, unspecified: Secondary | ICD-10-CM

## 2022-03-06 DIAGNOSIS — R269 Unspecified abnormalities of gait and mobility: Secondary | ICD-10-CM

## 2022-03-06 NOTE — Therapy (Signed)
OUTPATIENT PHYSICAL THERAPY TREATMENT NOTE   Patient Name: Justin Nixon MRN: [id number] DOB:03/23/1976, 46 y.o., male Today's Date: 03/06/2022  PCP: Gracelyn NurseJohn D Johnston, MD REFERRING PROVIDER: Venetia Nighthester Yarbrough, MD  END OF SESSION:   PT End of Session - 03/06/22 0905     Visit Number 17    Number of Visits 25    Date for PT Re-Evaluation 04/03/22    Progress Note Due on Visit 10    PT Start Time 0857    PT Stop Time 0938    PT Time Calculation (min) 41 min    Equipment Utilized During Treatment Gait belt    Activity Tolerance Patient limited by fatigue;Patient limited by pain    Behavior During Therapy WFL for tasks assessed/performed             Past Medical History:  Diagnosis Date   Anxiety    Arthritis    GERD (gastroesophageal reflux disease)    H/O   Hyperlipidemia    Schizophrenia (HCC)    Past Surgical History:  Procedure Laterality Date   ANTERIOR LATERAL LUMBAR FUSION WITH PERCUTANEOUS SCREW 1 LEVEL N/A 11/18/2021   Procedure: L4-5 LATERAL LUMBAR INTERBODY FUSION WITH POSTERIOR SPINAL FUSION;  Surgeon: Venetia NightYarbrough, Chester, MD;  Location: [facility];  Service: Neurosurgery;  Laterality: N/A;   APPLICATION OF INTRAOPERATIVE CT SCAN N/A 11/18/2021   Procedure: APPLICATION OF INTRAOPERATIVE CT SCAN;  Surgeon: Venetia NightYarbrough, Chester, MD;  Location: [facility];  Service: Neurosurgery;  Laterality: N/A;   COLONOSCOPY     CYST EXCISION     ELBOW ARTHROSCOPY Left 10/10/2020   Procedure: LEFT ELBOW ARTHROSCOPY WITH DEBRIDEMENT AND EXCISION OF LOOSE BODIES;  Surgeon: Christena FlakePoggi, John J, MD;  Location: [facility];  Service: Orthopedics;  Laterality: Left;   MYRINGOTOMY WITH TUBE PLACEMENT     ORIF ELBOW FRACTURE Left 08/28/2021   Procedure: OPEN REDUCTION INTERNAL FIXATION (ORIF) ELBOW/OLECRANON FRACTURE;  Surgeon: Christena FlakePoggi, John J, MD;  Location: [facility];  Service: Orthopedics;  Laterality: Left;   SHOULDER ARTHROSCOPY Left 03/18/2017   Procedure: ARTHROSCOPY SHOULDER with debridement  decompression and distal clavicle excision;  Surgeon: Christena FlakePoggi, John J, MD;  [facility];  Service: Orthopedics;  Laterality: Left;   SHOULDER ARTHROSCOPY WITH OPEN ROTATOR CUFF REPAIR Right 09/23/2016   Procedure: SHOULDER ARTHROSCOPY WITH OPEN ROTATOR CUFF REPAIR;  Surgeon: Christena FlakeJohn J Poggi, MD;  Location: [facility];  Service: Orthopedics;  Laterality: Right;   SHOULDER ARTHROSCOPY WITH SUBACROMIAL DECOMPRESSION Right 09/23/2016   Procedure: SHOULDER ARTHROSCOPY WITH SUBACROMIAL DECOMPRESSION AND DEBRIDEMENT;  Surgeon: Christena FlakeJohn J Poggi, MD;  Location: [facility];  Service: Orthopedics;  Laterality: Right;   Patient Active Problem List   Diagnosis Date Noted   S/P spinal fusion 11/18/2021   Olecranon fracture, left, open type I or II, initial encounter 08/28/2021      REFERRING DIAG: Z98.1 (ICD-10-CM) - History of lumbar fusion   THERAPY DIAG:  Muscle weakness (generalized)   S/P lumbar fusion   Abnormality of gait and mobility   Unsteadiness on feet   Rationale for Evaluation and Treatment Rehabilitation   PERTINENT HISTORY: Pt presents to physical therapy for evaluation of low back and LE pain and weakness L>R, following L4-L5 XLIF/PSF on 11/18/21. Pt states he was using a cane before surgery due to onset of LLE weakness. States he has now been using a RW since surgery. Pain ranges from a 5/10 to a 10/10. When aggravated pain is described as sharp and shooting into LLE; at baseline, pain  is achy. Wearing clothes such as jeans is painful due to sensitivity of skin; at home wears pajamas. Pt has not returned to driving; he is limited to household distance ambulation or maximum into a doctors office. He feels as though his LLE is going to "give out" when he is walking or weightbearing. 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 Reports frequent falling since surgery due to LLE  giving out; no injuries associated with falls.    PRECAUTIONS: Back - no bending, limited twisting, no lifting > 25#. Per physician, after 12 weeks post-op, progress activity as tolerated.      OBJECTIVE: (objective measures completed at initial evaluation unless otherwise dated)   DIAGNOSTIC FINDINGS:  "Intraoperative images during L4-L5 anterior and posterior fusion. No evidence of immediate complication"   PATIENT SURVEYS:  Modified Oswestry (updated 01/09/22 due to incomplete form): 62%.   02/11/22: 48%.  03/04/22: 28% FOTO: eval 23/41,   02/11/22: 44/41   SCREENING FOR RED FLAGS: Bowel or bladder incontinence: No Spinal tumors: No Cauda equina syndrome: No Compression fracture: No Abdominal aneurysm: No   COGNITION:           Overall cognitive status: Within functional limits for tasks assessed                          SENSATION: Light touch: Impaired  - increased sensitivity in left L2 and L3 dermatomes    MUSCLE LENGTH: Hamstrings: unable to test due to increase in sharp/shooting pain in hip flexors prior to HS stretch being felt.  (01/28/22): R lacking 30 deg, L lacking 40 deg.   Thomas test: NT   POSTURE: decreased lumbar lordosis and posterior pelvic tilt   PALPATION: Right: hip flexors and lateral hip, glute med, piriformis, deep external rotators, QL Left: hip flexors, lateral hip and adductors (from proximal hip to mid-thigh), proximal hamstrings, L4-distal paraspinals, QL, all gluteals   OBSERVATION 01/21/22: No bruising, ecchymosis, increased tissue temperature, or erythema observed along L flank, L gluteal region, or adjacent to surgical incisions. No sign of infection or delayed healing   LUMBAR ROM: not assessed due to back precautions    Active  A/PROM  eval  Flexion -  Extension -  Right lateral flexion -  Left lateral flexion -  Right rotation -  Left rotation -   (Blank rows = not tested)   LOWER EXTREMITY ROM:      Passive  Right eval Left eval   Hip flexion 100 90  Hip extension NT NT  Hip abduction 20 20  Hip adduction      Hip internal rotation      Hip external rotation      Knee flexion [facility]  Knee extension [facility]  Ankle dorsiflexion [facility]  Ankle plantarflexion      Ankle inversion      Ankle eversion       (Blank rows = not tested)   LOWER EXTREMITY MMT:     MMT Right eval Left eval Right 02/11/22 Left 02/11/22  Hip flexion 4-* 4-* 4+ 4*  Hip extension          Hip abduction 4+* 4-* 4+ 4+  Hip adduction 4* 4* 4+ 4+  Hip internal rotation 5 3* 5 4*  Hip external rotation 5 4* 5 4*  Knee flexion 5 4* 5 4+  Knee extension  5 4+ 5 4  Ankle dorsiflexion 4+ 3+ 5 4  Ankle plantarflexion           (Blank rows = not tested)     FUNCTIONAL TESTS:  5 times sit to stand: 43.37 seconds using arms.    02/11/22: 36.1 seconds.  03/04/22: 27.4 seconds.  6 minute walk test (performed 01/14/22): 280 feet.     02/11/22: 600 feet.  03/04/22: 735 feet. 10 meter walk test: 24.3 seconds (comfortable) = 0.41 m/s using RW; terminated fast-pace for safety.   02/11/22: 0.64 m/s comfortable speed, 0.65 m/s fast speed.  03/04/22: 0.65 m/s fast speed   STS mechanics: no UE support, use of momentum with rapid hip hinge to initiate sit to stand. Intermittent LLE buckling and varus/valgus immediately following initiation of sit to stand             -03/04/22: After 2 attempts pt able to perform sit to stand with no UE support, upward velocity of transfers slows down halfway through transfer with intermittent LLE varus/valgus   GAIT: Distance walked: 735 feet Assistive device utilized: SPC Level of assistance: standby assist Comments: good heel to toe pattern, intermittent LLE buckling requiring pt holding wall and intermittently placing hands on his L thigh for support; no assist required from PT to prevent fall         TODAY'S TREATMENT    SUBJECTIVE: Patient reports having remaining R-sided low back pain at 3/10 intensity. Patient reports  having stumble in his kitchen in which his flank ran into counter. Patient reports not falling onto the floor at the time. He does plan on discussing abdominal pain with his physician, but has not done this yet - this discomfort has improved.      PAIN:  Are you having pain? Yes, 3/10 pain affecting his R flank, "light" left-sided back/hip pain Pain location: Pain in L thigh and knee, L hip          Moist Hot Pack (unbilled) utilized  during warm-up on SciFit for analgesic effect and improved soft tissue extensibility, along low back x 5 minutes        Therapeutic Exercise - for improved soft tissue flexibility and extensibility as needed for ROM, improved strength as needed to improve performance of CKC activities/functional movements     SciFit seated elliptical; Level 4; seat at 8, 5 minutes - subjective information gathered periodically during this time, 3 minutes not billed    In Hallway with no AD:  Forward/backward stepping with therapist guarding patient posteriorly in case of LLE buckling, x 4 D/B 30-ft course Sidestep with minimal upper limb support, careful guarding with intermittent LLE buckling; x4 D/B 30-ft course *Intermittent LLE buckling with forward stepping in hallway today with pt independently recovering balance with brief touch on adjacent wall    Sit to stand from standard chair; maintaining neutral spine; 2x10   In parallel bars: Dynamic forward march with contralateral knee touch; 4x D/B length of bars  -significant LOB with LLE buckling requiring ModA to prevent fall and pt abruptly grab into bilateral parallel bars Adjacent to single parallel bar, seated on blue physioball, alternating shoulder and hip flexion (opposite sides) with upright posture; 2x10 alternating  Full Tandem stand on Airex pad; 2x30sec Forward step up to single Airex pad; 1x10 and 1x8 with LLE     On treatment table:             Lower trunk rotations in hooklying; 2x10 alternating  R/L  Bird dog; quadruped; 1x8 alternating                   *not today* Supine dead bug with alternating knee extension/shoulder flexion; 2x10 alternating 3 6-inch hurdles consecutive step-over; step-to pattern x 2 D/B, reciprocal pattern x2 D/B Consecutive forward step-up to Airex pad, 3 Airex pads in // bars; 4x D/B                 Piriformis stretch; 20x, 1 sec Active hamstring stretch (glider technique): 2x10 on each LE Gait with SPC following demo from PT; ambulate around gym x 1 lap with VC for cane/foot sequencing NuStep, seat at 10 and arms at 11; Level 3 resistance; 5 minutes  Dynamic march 3x D/B length of // bars Standing march with bilateral light touch on single parallel bar for complete weight shift to each lower limb; x10 alternating Seated hip abduction with Grn Tband, with abdominal brace and upright posture/neutral spine; 2x10 Abdominal brace with Bridge c neutral spine,  full range; 2x10 Abdominal brace with alternating march and shoulder flexion; 2x10 alternating R/L Standing toe tap onto 6-inch step; 2x10 alternating R/L for weight shifting and weight acceptance onto L lower limb Standing heel raise; 2x10 with bilat support on front-wheeled walker Abdominal brace/TrA contraction with alternating adductor ball squeeze/hip ABD iso against belt; 2x10, 5 sec  6-minute walk with front-wheel walker Abdominal brace/TrA contraction; x10, 5 sec Glute set; 2x10, 5 sec           HOME EXERCISE PROGRAM: Access Code [email or web address]       ASSESSMENT:   CLINICAL IMPRESSION: Patient has has significant buckling of LLE during marching activity in // bars requiring brief rest period; ModA from PT with hands around ribcage and trunk to prevent fall, but pt is able to support majority of body weight on bars with bilateral upper limbs. He demonstrates good gait pattern when not experiencing significant episode of LLE buckling. He does have "light" L lower quarter pain at this time, 3/10  pain affecting R flank which does respond well acutely to use of moist heat. PT is focusing on recovery of function at this time and core/hip strengthening versus symptom modulation given relatively stable pain scale presently. Did recommend continued use of SPC for home-level mobility in case of LOB. Pt needs continued work on full recovery of function and improved independence with community-level mobility. Pt will benefit from skilled physical therapy to address the noted deficits in order to increase safety with functional activities and allow pt to return to work and home duties.      OBJECTIVE IMPAIRMENTS Abnormal gait, decreased activity tolerance, decreased balance, decreased endurance, decreased mobility, difficulty walking, decreased ROM, decreased strength, decreased safety awareness, impaired flexibility, impaired sensation, postural dysfunction, and pain.    ACTIVITY LIMITATIONS carrying, lifting, bending, sitting, standing, squatting, stairs, and transfers   PARTICIPATION LIMITATIONS: cleaning, laundry, driving, shopping, community activity, occupation, and yard work   PERSONAL FACTORS Time since onset of injury/illness/exacerbation and 1-2 comorbidities: schizophrenia and anxiety  are also affecting patient's functional outcome.    REHAB POTENTIAL: Good   CLINICAL DECISION MAKING: Evolving/moderate complexity   EVALUATION COMPLEXITY: Moderate     GOALS: Goals reviewed with patient? Yes   SHORT TERM GOALS: Target date: 01/21/2022   Patient will be independent in home exercise program to improve strength/mobility for better functional independence with ADLs. Baseline:   02/11/22: Pt is compliant with his HEP  Goal status: ACHIEVED   2.  Pt will ambulate 200+ feet using RW without LOB or LLE buckling to demonstrate progression towards improved ambulatory endurance.  Baseline: 134ft .    02/11/22: Pt able to ambulate > 200 feet without LLE buckling Goal status: ACHIEVED   3.    Patient will deny any falls over past 4 weeks to demonstrate improved safety at home.  Baseline: 4 falls since surgery.   02/11/22: 2 falls in June.   03/04/22: No falls in previous month.  Goal status: ACHIEVED     LONG TERM GOALS: Target date: 03/04/2022   Patient will increase FOTO score to equal to or greater than 41 to demonstrate statistically significant improvement in mobility and quality of life.  Baseline: 01/07/22: 23/41.  7/11/2: 44/41 Goal status: ACHIEVED   2.   Patient (< 38 years old) will complete five times sit to stand test in < 10 seconds indicating an increased LE strength and improved balance. Baseline: 01/07/22: 43 seconds.   02/11/22: 36.1 sec.  03/04/22: 27.4 seconds.  Goal status: IN PROGRESS   3.  Patient will increase 10 meter walk test to >1.12m/s as to improve gait speed for better community ambulation and to reduce fall risk. Baseline: 01/07/22: 0.41 m/s.   02/11/22: 0.65 m/s.    03/04/22: 0.65 m/s fast speed Goal status: IN PROGRESS/NO CHANGE FROM PREVIOUS RE-ASSESSMENT   4.  Patient will increase six minute walk test distance to >1000 for progression to community ambulator and improve gait ability. Baseline: 01/14/22: 280 feet.  02/11/22: 600 feet.   03/04/22: 735 feet. Goal status: IN PROGRESS   5.  Patient will increase LLE gross strength to 4/5 as to improve functional strength for independent gait, increased standing tolerance and increased ADL ability. Baseline: 01/07/22: ranging 3-4/5, please see objective.   02/11/22: 4 to 4+/5 MMTs, see above.  Goal status: ACHIEVED         PLAN: PT FREQUENCY: 2x/week   PT DURATION: 4-6 weeks   PLANNED INTERVENTIONS: Therapeutic exercises, Therapeutic activity, Neuromuscular re-education, Balance training, Gait training, Patient/Family education, Joint mobilization, Stair training, DME instructions, Dry Needling, Cryotherapy, Moist heat, and Manual therapy.   PLAN FOR NEXT SESSION: LE strengthening, trunk stabilization, LE  flexibility. Progress with increasing emphasis on standing/weightbearing activity and continued AD wean as able from [facility] to no AD. Modalities and manual therapy for pain modulation as needed. Recommend continued PT 2x/week for 4-6 weeks      Consuela Mimes, PT, DPT #[id number]  Gertie Exon, PT 03/06/2022, 9:05 AM

## 2022-03-10 ENCOUNTER — Encounter: Payer: Self-pay | Admitting: Physical Therapy

## 2022-03-10 ENCOUNTER — Ambulatory Visit: Payer: Medicare Other | Admitting: Physical Therapy

## 2022-03-10 DIAGNOSIS — Z981 Arthrodesis status: Secondary | ICD-10-CM

## 2022-03-10 DIAGNOSIS — M545 Low back pain, unspecified: Secondary | ICD-10-CM

## 2022-03-10 DIAGNOSIS — M6281 Muscle weakness (generalized): Secondary | ICD-10-CM

## 2022-03-10 DIAGNOSIS — R2681 Unsteadiness on feet: Secondary | ICD-10-CM

## 2022-03-10 DIAGNOSIS — R269 Unspecified abnormalities of gait and mobility: Secondary | ICD-10-CM

## 2022-03-10 NOTE — Therapy (Signed)
OUTPATIENT PHYSICAL THERAPY TREATMENT NOTE   Patient Name: Justin Nixon: [id number] DOB:09/30/1975, 46 y.o., male Today's Date: 03/10/2022  PCP: Gracelyn NurseJohn D Johnston, MD REFERRING PROVIDER: Venetia Nighthester Yarbrough, MD  END OF SESSION:   PT End of Session - 03/10/22 0848     Visit Number 18    Number of Visits 25    Date for PT Re-Evaluation 04/03/22    Progress Note Due on Visit 10    PT Start Time 0844    PT Stop Time 0927    PT Time Calculation (min) 43 min    Equipment Utilized During Treatment Gait belt    Activity Tolerance Patient limited by fatigue;Patient limited by pain    Behavior During Therapy WFL for tasks assessed/performed             Past Medical History:  Diagnosis Date   Anxiety    Arthritis    GERD (gastroesophageal reflux disease)    H/O   Hyperlipidemia    Schizophrenia (HCC)    Past Surgical History:  Procedure Laterality Date   ANTERIOR LATERAL LUMBAR FUSION WITH PERCUTANEOUS SCREW 1 LEVEL N/A 11/18/2021   Procedure: L4-5 LATERAL LUMBAR INTERBODY FUSION WITH POSTERIOR SPINAL FUSION;  Surgeon: Venetia NightYarbrough, Chester, MD;  Location: [facility];  Service: Neurosurgery;  Laterality: N/A;   APPLICATION OF INTRAOPERATIVE CT SCAN N/A 11/18/2021   Procedure: APPLICATION OF INTRAOPERATIVE CT SCAN;  Surgeon: Venetia NightYarbrough, Chester, MD;  Location: [facility];  Service: Neurosurgery;  Laterality: N/A;   COLONOSCOPY     CYST EXCISION     ELBOW ARTHROSCOPY Left 10/10/2020   Procedure: LEFT ELBOW ARTHROSCOPY WITH DEBRIDEMENT AND EXCISION OF LOOSE BODIES;  Surgeon: Christena FlakePoggi, John J, MD;  Location: [facility];  Service: Orthopedics;  Laterality: Left;   MYRINGOTOMY WITH TUBE PLACEMENT     ORIF ELBOW FRACTURE Left 08/28/2021   Procedure: OPEN REDUCTION INTERNAL FIXATION (ORIF) ELBOW/OLECRANON FRACTURE;  Surgeon: Christena FlakePoggi, John J, MD;  Location: [facility];  Service: Orthopedics;  Laterality: Left;   SHOULDER ARTHROSCOPY Left 03/18/2017   Procedure: ARTHROSCOPY SHOULDER with debridement  decompression and distal clavicle excision;  Surgeon: Christena FlakePoggi, John J, MD;  [facility];  Service: Orthopedics;  Laterality: Left;   SHOULDER ARTHROSCOPY WITH OPEN ROTATOR CUFF REPAIR Right 09/23/2016   Procedure: SHOULDER ARTHROSCOPY WITH OPEN ROTATOR CUFF REPAIR;  Surgeon: Christena FlakeJohn J Poggi, MD;  Location: [facility];  Service: Orthopedics;  Laterality: Right;   SHOULDER ARTHROSCOPY WITH SUBACROMIAL DECOMPRESSION Right 09/23/2016   Procedure: SHOULDER ARTHROSCOPY WITH SUBACROMIAL DECOMPRESSION AND DEBRIDEMENT;  Surgeon: Christena FlakeJohn J Poggi, MD;  Location: [facility];  Service: Orthopedics;  Laterality: Right;   Patient Active Problem List   Diagnosis Date Noted   S/P spinal fusion 11/18/2021   Olecranon fracture, left, open type I or II, initial encounter 08/28/2021    REFERRING DIAG: Z98.1 (ICD-10-CM) - History of lumbar fusion   THERAPY DIAG:  Muscle weakness (generalized)   S/P lumbar fusion   Abnormality of gait and mobility   Unsteadiness on feet   Rationale for Evaluation and Treatment Rehabilitation   PERTINENT HISTORY: Pt presents to physical therapy for evaluation of low back and LE pain and weakness L>R, following L4-L5 XLIF/PSF on 11/18/21. Pt states he was using a cane before surgery due to onset of LLE weakness. States he has now been using a RW since surgery. Pain ranges from a 5/10 to a 10/10. When aggravated pain is described as sharp and shooting into LLE; at baseline, pain is achy.  Wearing clothes such as jeans is painful due to sensitivity of skin; at home wears pajamas. Pt has not returned to driving; he is limited to household distance ambulation or maximum into a doctors office. He feels as though his LLE is going to "give out" when he is walking or weightbearing. 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 Reports frequent falling since surgery due to LLE giving  out; no injuries associated with falls.    PRECAUTIONS: Back - no bending, limited twisting, no lifting > 25#. Per physician, after 12 weeks post-op, progress activity as tolerated.      OBJECTIVE: (objective measures completed at initial evaluation unless otherwise dated)   DIAGNOSTIC FINDINGS:  "Intraoperative images during L4-L5 anterior and posterior fusion. No evidence of immediate complication"   PATIENT SURVEYS:  Modified Oswestry (updated 01/09/22 due to incomplete form): 62%.   02/11/22: 48%.  03/04/22: 28% FOTO: eval 23/41,   02/11/22: 44/41   SCREENING FOR RED FLAGS: Bowel or bladder incontinence: No Spinal tumors: No Cauda equina syndrome: No Compression fracture: No Abdominal aneurysm: No   COGNITION:           Overall cognitive status: Within functional limits for tasks assessed                          SENSATION: Light touch: Impaired  - increased sensitivity in left L2 and L3 dermatomes    MUSCLE LENGTH: Hamstrings: unable to test due to increase in sharp/shooting pain in hip flexors prior to HS stretch being felt.  (01/28/22): R lacking 30 deg, L lacking 40 deg.   Thomas test: NT   POSTURE: decreased lumbar lordosis and posterior pelvic tilt   PALPATION: Right: hip flexors and lateral hip, glute med, piriformis, deep external rotators, QL Left: hip flexors, lateral hip and adductors (from proximal hip to mid-thigh), proximal hamstrings, L4-distal paraspinals, QL, all gluteals   OBSERVATION 01/21/22: No bruising, ecchymosis, increased tissue temperature, or erythema observed along L flank, L gluteal region, or adjacent to surgical incisions. No sign of infection or delayed healing   LUMBAR ROM: not assessed due to back precautions    Active  A/PROM  eval  Flexion -  Extension -  Right lateral flexion -  Left lateral flexion -  Right rotation -  Left rotation -   (Blank rows = not tested)   LOWER EXTREMITY ROM:      Passive  Right eval Left eval  Hip  flexion 100 90  Hip extension NT NT  Hip abduction 20 20  Hip adduction      Hip internal rotation      Hip external rotation      Knee flexion [facility]  Knee extension [facility]  Ankle dorsiflexion [facility]  Ankle plantarflexion      Ankle inversion      Ankle eversion       (Blank rows = not tested)   LOWER EXTREMITY MMT:     MMT Right eval Left eval Right 02/11/22 Left 02/11/22  Hip flexion 4-* 4-* 4+ 4*  Hip extension          Hip abduction 4+* 4-* 4+ 4+  Hip adduction 4* 4* 4+ 4+  Hip internal rotation 5 3* 5 4*  Hip external rotation 5 4* 5 4*  Knee flexion 5 4* 5 4+  Knee extension 5 4+  5 4  Ankle dorsiflexion 4+ 3+ 5 4  Ankle plantarflexion           (Blank rows = not tested)     FUNCTIONAL TESTS:  5 times sit to stand: 43.37 seconds using arms.    02/11/22: 36.1 seconds.  03/04/22: 27.4 seconds.  6 minute walk test (performed 01/14/22): 280 feet.     02/11/22: 600 feet.  03/04/22: 735 feet. 10 meter walk test: 24.3 seconds (comfortable) = 0.41 m/s using RW; terminated fast-pace for safety.   02/11/22: 0.64 m/s comfortable speed, 0.65 m/s fast speed.  03/04/22: 0.65 m/s fast speed   STS mechanics: no UE support, use of momentum with rapid hip hinge to initiate sit to stand. Intermittent LLE buckling and varus/valgus immediately following initiation of sit to stand             -03/04/22: After 2 attempts pt able to perform sit to stand with no UE support, upward velocity of transfers slows down halfway through transfer with intermittent LLE varus/valgus   GAIT: Distance walked: 735 feet Assistive device utilized: SPC Level of assistance: standby assist Comments: good heel to toe pattern, intermittent LLE buckling requiring pt holding wall and intermittently placing hands on his L thigh for support; no assist required from PT to prevent fall         TODAY'S TREATMENT    SUBJECTIVE: Patient reports having 4/10 pain affecting L flank at arrival. He reports minor pain affecting  R side. He states that L side of trunk feels "tight" with performing standing activity. He feels that he is going right direction with PT at this time. Patient reports he did have LOB due to puppy in home running between his legs and he caught himself on wall without falling.      PAIN:  Are you having pain? Yes, 3/10  Pain location: Pain in L thigh and knee, L hip         Therapeutic Exercise - for improved soft tissue flexibility and extensibility as needed for ROM, improved strength as needed to improve performance of CKC activities/functional movements     SciFit seated elliptical; Level 4; seat at 8, 5 minutes - subjective information gathered periodically during this time, 3 minutes not billed     In Hallway with no AD:  Forward/backward stepping with therapist guarding patient posteriorly in case of LLE buckling, x 4 D/B 30-ft course     Sit to stand from standard chair; maintaining neutral spine; 2x10    In parallel bars: Hurdle steps, consecutive; mixed height hurdles, 3 6-inch and 2 12-inch hurdles; 5x D/B 3-way Hip with Red Tband at ankles; 2x5 each LE Adjacent to single parallel bar, seated on blue physioball, alternating shoulder and hip flexion (opposite sides) with upright posture; 2x10 alternating  Full Tandem stand on Airex pad; 2x30sec - more challenging with LLE in back with intermittent UE touch required Forward step up to single Airex pad; 2x10 with LLE     Pt edu: HEP update and review to include sit to stand with adjacent counter support      *not today*             Lower trunk rotations in hooklying; 2x10 alternating R/L Bird dog; quadruped; 1x8 alternating Dynamic forward march with contralateral knee touch; 4x D/B length of bars Sidestep with minimal upper limb support, careful guarding with intermittent LLE buckling; x4 D/B 30-ft course Supine dead bug with alternating knee extension/shoulder flexion; 2x10 alternating 3  6-inch hurdles consecutive  step-over; step-to pattern x 2 D/B, reciprocal pattern x2 D/B Consecutive forward step-up to Airex pad, 3 Airex pads in // bars; 4x D/B                 Piriformis stretch; 20x, 1 sec Active hamstring stretch (glider technique): 2x10 on each LE Gait with SPC following demo from PT; ambulate around gym x 1 lap with VC for cane/foot sequencing NuStep, seat at 10 and arms at 11; Level 3 resistance; 5 minutes  Dynamic march 3x D/B length of // bars Standing march with bilateral light touch on single parallel bar for complete weight shift to each lower limb; x10 alternating Seated hip abduction with Grn Tband, with abdominal brace and upright posture/neutral spine; 2x10 Abdominal brace with Bridge c neutral spine,  full range; 2x10 Abdominal brace with alternating march and shoulder flexion; 2x10 alternating R/L Standing toe tap onto 6-inch step; 2x10 alternating R/L for weight shifting and weight acceptance onto L lower limb Standing heel raise; 2x10 with bilat support on front-wheeled walker Abdominal brace/TrA contraction with alternating adductor ball squeeze/hip ABD iso against belt; 2x10, 5 sec  6-minute walk with front-wheel walker Abdominal brace/TrA contraction; x10, 5 sec Glute set; 2x10, 5 sec           HOME EXERCISE PROGRAM: Access Code [email or web address]       ASSESSMENT:   CLINICAL IMPRESSION: Patient has one episode of significant LLE buckling requiring heavy upper limb support on parallel bars to prevent fall - no assist from PT needed at the time. Patient has remaining LLE weakness and residual bilateral iliolumbar pain and L hip sensitivity limiting his current condition. Pt is continuing with desensitization and mobility exercise at home; PT in the clinic is focusing on functional recovery and improving standing and gait. Pt needs continued work on full recovery of function and improved independence with community-level mobility. Pt will benefit from skilled physical therapy to  address the noted deficits in order to increase safety with functional activities and allow pt to return to work and home duties.      OBJECTIVE IMPAIRMENTS Abnormal gait, decreased activity tolerance, decreased balance, decreased endurance, decreased mobility, difficulty walking, decreased ROM, decreased strength, decreased safety awareness, impaired flexibility, impaired sensation, postural dysfunction, and pain.    ACTIVITY LIMITATIONS carrying, lifting, bending, sitting, standing, squatting, stairs, and transfers   PARTICIPATION LIMITATIONS: cleaning, laundry, driving, shopping, community activity, occupation, and yard work   PERSONAL FACTORS Time since onset of injury/illness/exacerbation and 1-2 comorbidities: schizophrenia and anxiety  are also affecting patient's functional outcome.    REHAB POTENTIAL: Good   CLINICAL DECISION MAKING: Evolving/moderate complexity   EVALUATION COMPLEXITY: Moderate     GOALS: Goals reviewed with patient? Yes   SHORT TERM GOALS: Target date: 01/21/2022   Patient will be independent in home exercise program to improve strength/mobility for better functional independence with ADLs. Baseline:   02/11/22: Pt is compliant with his HEP  Goal status: ACHIEVED   2.  Pt will ambulate 200+ feet using RW without LOB or LLE buckling to demonstrate progression towards improved ambulatory endurance.  Baseline: 120ft .    02/11/22: Pt able to ambulate > 200 feet without LLE buckling Goal status: ACHIEVED   3.   Patient will deny any falls over past 4 weeks to demonstrate improved safety at home.  Baseline: 4 falls since surgery.   02/11/22: 2 falls in June.   03/04/22: No falls in previous month.  Goal status:  ACHIEVED     LONG TERM GOALS: Target date: 03/04/2022   Patient will increase FOTO score to equal to or greater than 41 to demonstrate statistically significant improvement in mobility and quality of life.  Baseline: 01/07/22: 23/41.  7/11/2: 44/41 Goal  status: ACHIEVED   2.   Patient (< 12 years old) will complete five times sit to stand test in < 10 seconds indicating an increased LE strength and improved balance. Baseline: 01/07/22: 43 seconds.   02/11/22: 36.1 sec.  03/04/22: 27.4 seconds.  Goal status: IN PROGRESS   3.  Patient will increase 10 meter walk test to >1.25m/s as to improve gait speed for better community ambulation and to reduce fall risk. Baseline: 01/07/22: 0.41 m/s.   02/11/22: 0.65 m/s.    03/04/22: 0.65 m/s fast speed Goal status: IN PROGRESS/NO CHANGE FROM PREVIOUS RE-ASSESSMENT   4.  Patient will increase six minute walk test distance to >1000 for progression to community ambulator and improve gait ability. Baseline: 01/14/22: 280 feet.  02/11/22: 600 feet.   03/04/22: 735 feet. Goal status: IN PROGRESS   5.  Patient will increase LLE gross strength to 4/5 as to improve functional strength for independent gait, increased standing tolerance and increased ADL ability. Baseline: 01/07/22: ranging 3-4/5, please see objective.   02/11/22: 4 to 4+/5 MMTs, see above.  Goal status: ACHIEVED         PLAN: PT FREQUENCY: 2x/week   PT DURATION: 4-6 weeks   PLANNED INTERVENTIONS: Therapeutic exercises, Therapeutic activity, Neuromuscular re-education, Balance training, Gait training, Patient/Family education, Joint mobilization, Stair training, DME instructions, Dry Needling, Cryotherapy, Moist heat, and Manual therapy.   PLAN FOR NEXT SESSION: LE strengthening, trunk stabilization, LE flexibility. Progress with increasing emphasis on standing/weightbearing activity and continued AD wean as able from [facility] to no AD. Modalities and manual therapy for pain modulation as needed.     Consuela Mimes, PT, DPT #[id number]  Gertie Exon, PT 03/10/2022, 8:49 AM

## 2022-03-11 ENCOUNTER — Encounter: Payer: Self-pay | Admitting: Physical Therapy

## 2022-03-11 ENCOUNTER — Ambulatory Visit: Payer: Medicare Other | Admitting: Physical Therapy

## 2022-03-11 DIAGNOSIS — M6281 Muscle weakness (generalized): Secondary | ICD-10-CM

## 2022-03-11 DIAGNOSIS — Z981 Arthrodesis status: Secondary | ICD-10-CM

## 2022-03-11 DIAGNOSIS — R2681 Unsteadiness on feet: Secondary | ICD-10-CM

## 2022-03-11 DIAGNOSIS — R269 Unspecified abnormalities of gait and mobility: Secondary | ICD-10-CM

## 2022-03-11 DIAGNOSIS — M545 Low back pain, unspecified: Secondary | ICD-10-CM

## 2022-03-11 NOTE — Therapy (Unsigned)
OUTPATIENT PHYSICAL THERAPY TREATMENT NOTE   Patient Name: Justin Nixon MRN: [id number] DOB:September 21, 1975, 46 y.o., male Today's Date: 03/12/2022  PCP: Gracelyn Nurse, MD REFERRING PROVIDER: Venetia Night, MD  END OF SESSION:   PT End of Session - 03/12/22 1505     Visit Number 19    Number of Visits 25    Date for PT Re-Evaluation 04/03/22    Progress Note Due on Visit 10    PT Start Time 0900    PT Stop Time 0943    PT Time Calculation (min) 43 min    Equipment Utilized During Treatment Gait belt    Activity Tolerance Patient limited by fatigue;Patient limited by pain    Behavior During Therapy WFL for tasks assessed/performed             Past Medical History:  Diagnosis Date   Anxiety    Arthritis    GERD (gastroesophageal reflux disease)    H/O   Hyperlipidemia    Schizophrenia (HCC)    Past Surgical History:  Procedure Laterality Date   ANTERIOR LATERAL LUMBAR FUSION WITH PERCUTANEOUS SCREW 1 LEVEL N/A 11/18/2021   Procedure: L4-5 LATERAL LUMBAR INTERBODY FUSION WITH POSTERIOR SPINAL FUSION;  Surgeon: Venetia Night, MD;  Location: [facility];  Service: Neurosurgery;  Laterality: N/A;   APPLICATION OF INTRAOPERATIVE CT SCAN N/A 11/18/2021   Procedure: APPLICATION OF INTRAOPERATIVE CT SCAN;  Surgeon: Venetia Night, MD;  Location: [facility];  Service: Neurosurgery;  Laterality: N/A;   COLONOSCOPY     CYST EXCISION     ELBOW ARTHROSCOPY Left 10/10/2020   Procedure: LEFT ELBOW ARTHROSCOPY WITH DEBRIDEMENT AND EXCISION OF LOOSE BODIES;  Surgeon: Christena Flake, MD;  Location: [facility];  Service: Orthopedics;  Laterality: Left;   MYRINGOTOMY WITH TUBE PLACEMENT     ORIF ELBOW FRACTURE Left 08/28/2021   Procedure: OPEN REDUCTION INTERNAL FIXATION (ORIF) ELBOW/OLECRANON FRACTURE;  Surgeon: Christena Flake, MD;  Location: [facility];  Service: Orthopedics;  Laterality: Left;   SHOULDER ARTHROSCOPY Left 03/18/2017   Procedure: ARTHROSCOPY SHOULDER with debridement  decompression and distal clavicle excision;  Surgeon: Christena Flake, MD;  Location: [facility];  Service: Orthopedics;  Laterality: Left;   SHOULDER ARTHROSCOPY WITH OPEN ROTATOR CUFF REPAIR Right 09/23/2016   Procedure: SHOULDER ARTHROSCOPY WITH OPEN ROTATOR CUFF REPAIR;  Surgeon: Christena Flake, MD;  Location: [facility];  Service: Orthopedics;  Laterality: Right;   SHOULDER ARTHROSCOPY WITH SUBACROMIAL DECOMPRESSION Right 09/23/2016   Procedure: SHOULDER ARTHROSCOPY WITH SUBACROMIAL DECOMPRESSION AND DEBRIDEMENT;  Surgeon: Christena Flake, MD;  Location: [facility];  Service: Orthopedics;  Laterality: Right;   Patient Active Problem List   Diagnosis Date Noted   S/P spinal fusion 11/18/2021   Olecranon fracture, left, open type I or II, initial encounter 08/28/2021        REFERRING DIAG: Z98.1 (ICD-10-CM) - History of lumbar fusion   THERAPY DIAG:  Muscle weakness (generalized)   S/P lumbar fusion   Abnormality of gait and mobility   Unsteadiness on feet   Rationale for Evaluation and Treatment Rehabilitation   PERTINENT HISTORY: Pt presents to physical therapy for evaluation of low back and LE pain and weakness L>R, following L4-L5 XLIF/PSF on 11/18/21. Pt states he was using a cane before surgery due to onset of LLE weakness. States he has now been using a RW since surgery. Pain ranges from a 5/10 to a 10/10. When aggravated pain is described as sharp and shooting into LLE; at  baseline, pain is achy. Wearing clothes such as jeans is painful due to sensitivity of skin; at home wears pajamas. Pt has not returned to driving; he is limited to household distance ambulation or maximum into a doctors office. He feels as though his LLE is going to "give out" when he is walking or weightbearing. 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 Reports frequent falling since surgery due to LLE  giving out; no injuries associated with falls.    PRECAUTIONS: Back - no bending, limited twisting, no lifting > 25#. Per physician, after 12 weeks post-op, progress activity as tolerated.      OBJECTIVE: (objective measures completed at initial evaluation unless otherwise dated)   DIAGNOSTIC FINDINGS:  "Intraoperative images during L4-L5 anterior and posterior fusion. No evidence of immediate complication"   PATIENT SURVEYS:  Modified Oswestry (updated 01/09/22 due to incomplete form): 62%.   02/11/22: 48%.  03/04/22: 28% FOTO: eval 23/41,   02/11/22: 44/41   SCREENING FOR RED FLAGS: Bowel or bladder incontinence: No Spinal tumors: No Cauda equina syndrome: No Compression fracture: No Abdominal aneurysm: No   COGNITION:           Overall cognitive status: Within functional limits for tasks assessed                          SENSATION: Light touch: Impaired  - increased sensitivity in left L2 and L3 dermatomes    MUSCLE LENGTH: Hamstrings: unable to test due to increase in sharp/shooting pain in hip flexors prior to HS stretch being felt.  (01/28/22): R lacking 30 deg, L lacking 40 deg.   Thomas test: NT   POSTURE: decreased lumbar lordosis and posterior pelvic tilt   PALPATION: Right: hip flexors and lateral hip, glute med, piriformis, deep external rotators, QL Left: hip flexors, lateral hip and adductors (from proximal hip to mid-thigh), proximal hamstrings, L4-distal paraspinals, QL, all gluteals   OBSERVATION 01/21/22: No bruising, ecchymosis, increased tissue temperature, or erythema observed along L flank, L gluteal region, or adjacent to surgical incisions. No sign of infection or delayed healing   LUMBAR ROM: not assessed due to back precautions    Active  A/PROM  eval  Flexion -  Extension -  Right lateral flexion -  Left lateral flexion -  Right rotation -  Left rotation -   (Blank rows = not tested)   LOWER EXTREMITY ROM:      Passive  Right eval Left eval   Hip flexion 100 90  Hip extension NT NT  Hip abduction 20 20  Hip adduction      Hip internal rotation      Hip external rotation      Knee flexion [facility]  Knee extension [facility]  Ankle dorsiflexion [facility]  Ankle plantarflexion      Ankle inversion      Ankle eversion       (Blank rows = not tested)   LOWER EXTREMITY MMT:     MMT Right eval Left eval Right 02/11/22 Left 02/11/22  Hip flexion 4-* 4-* 4+ 4*  Hip extension          Hip abduction 4+* 4-* 4+ 4+  Hip adduction 4* 4* 4+ 4+  Hip internal rotation 5 3* 5 4*  Hip external rotation 5 4* 5 4*  Knee flexion 5 4* 5 4+  Knee extension 5 4+ 5 4  Ankle dorsiflexion 4+ 3+ 5 4  Ankle plantarflexion           (Blank rows = not tested)     FUNCTIONAL TESTS:  5 times sit to stand: 43.37 seconds using arms.    02/11/22: 36.1 seconds.  03/04/22: 27.4 seconds.  6 minute walk test (performed 01/14/22): 280 feet.     02/11/22: 600 feet.  03/04/22: 735 feet. 10 meter walk test: 24.3 seconds (comfortable) = 0.41 m/s using RW; terminated fast-pace for safety.   02/11/22: 0.64 m/s comfortable speed, 0.65 m/s fast speed.  03/04/22: 0.65 m/s fast speed   STS mechanics: no UE support, use of momentum with rapid hip hinge to initiate sit to stand. Intermittent LLE buckling and varus/valgus immediately following initiation of sit to stand             -03/04/22: After 2 attempts pt able to perform sit to stand with no UE support, upward velocity of transfers slows down halfway through transfer with intermittent LLE varus/valgus   GAIT: Distance walked: 735 feet Assistive device utilized: SPC Level of assistance: standby assist Comments: good heel to toe pattern, intermittent LLE buckling requiring pt holding wall and intermittently placing hands on his L thigh for support; no assist required from PT to prevent fall         TODAY'S TREATMENT    SUBJECTIVE: Patient reports having 2/10 pain at arrival to PT. He reports difficulty with getting  full night's sleep Sunday night and last night. He reports difficulty sleeping since his surgery. Patient reports being sore and fatigued after PT yesterday and taking 2 naps due to this. Patient reports pain affecting L side of low back and L hip primarily. He feels that his pain is relatively mild this AM, but he does report stinging along L iliolumbar region sometimes bothering him when lying down at night and when he is trying to sleep.      PAIN:  Are you having pain? Yes, 2/10  Pain location: Pain in L thigh and knee, L hip          Therapeutic Exercise - for improved soft tissue flexibility and extensibility as needed for ROM, improved strength as needed to improve performance of CKC activities/functional movements     SciFit seated elliptical; Level 4; seat at 10, 5 minutes - subjective information gathered periodically during this time, 1 minute not billed     In Hallway with no AD:  Forward/backward stepping with therapist guarding patient posteriorly in case of LLE buckling, x 2 D/B 70-ft course Gait with side-to-side ball toss; 3x D/B 70-ft hallway     Sit to stand from standard chair; maintaining neutral spine; 2x10    In parallel bars: Hurdle steps, consecutive; mixed height hurdles, 3 6-inch and 2 12-inch hurdles; 5x D/B 3-way Hip with Red Tband at ankles; 2x5 each LE Adjacent to single parallel bar, seated on blue physioball, alternating shoulder and hip flexion (opposite sides) with upright posture; 2x10 alternating  Full Tandem stand on Airex pad; 2x30sec - more challenging with LLE in back with intermittent UE touch required Forward step up to 6-inch step, staircase in center of clinic ; 2x10 with LLE           *not today*             Lower trunk rotations in hooklying; 2x10 alternating R/L Bird dog; quadruped; 1x8 alternating Dynamic forward march with contralateral knee touch; 4x  D/B length of bars Sidestep with minimal upper limb support, careful guarding  with intermittent LLE buckling; x4 D/B 30-ft course Supine dead bug with alternating knee extension/shoulder flexion; 2x10 alternating 3 6-inch hurdles consecutive step-over; step-to pattern x 2 D/B, reciprocal pattern x2 D/B Consecutive forward step-up to Airex pad, 3 Airex pads in // bars; 4x D/B                 Piriformis stretch; 20x, 1 sec Active hamstring stretch (glider technique): 2x10 on each LE Gait with SPC following demo from PT; ambulate around gym x 1 lap with VC for cane/foot sequencing NuStep, seat at 10 and arms at 11; Level 3 resistance; 5 minutes  Dynamic march 3x D/B length of // bars Standing march with bilateral light touch on single parallel bar for complete weight shift to each lower limb; x10 alternating Seated hip abduction with Grn Tband, with abdominal brace and upright posture/neutral spine; 2x10 Abdominal brace with Bridge c neutral spine,  full range; 2x10 Abdominal brace with alternating march and shoulder flexion; 2x10 alternating R/L Standing toe tap onto 6-inch step; 2x10 alternating R/L for weight shifting and weight acceptance onto L lower limb Standing heel raise; 2x10 with bilat support on front-wheeled walker Abdominal brace/TrA contraction with alternating adductor ball squeeze/hip ABD iso against belt; 2x10, 5 sec  6-minute walk with front-wheel walker Abdominal brace/TrA contraction; x10, 5 sec Glute set; 2x10, 5 sec           HOME EXERCISE PROGRAM: Access Code [email or web address]       ASSESSMENT:   CLINICAL IMPRESSION: Patient has relatively low NPRS at this time that is significantly lower than pain reported over first month of PT. He has been progressing with ability to perform load-bearing activity on LLE, but he still has intermittent buckling of L knee intermittently during gait and has remaining hip and knee weakness on L side. Pt needs continued work on strengthening and restoration of gait stability to get to his PLOF. Pt will benefit from  skilled physical therapy to address the noted deficits in order to increase safety with functional activities and allow pt to return to work and home duties.      OBJECTIVE IMPAIRMENTS Abnormal gait, decreased activity tolerance, decreased balance, decreased endurance, decreased mobility, difficulty walking, decreased ROM, decreased strength, decreased safety awareness, impaired flexibility, impaired sensation, postural dysfunction, and pain.    ACTIVITY LIMITATIONS carrying, lifting, bending, sitting, standing, squatting, stairs, and transfers   PARTICIPATION LIMITATIONS: cleaning, laundry, driving, shopping, community activity, occupation, and yard work   PERSONAL FACTORS Time since onset of injury/illness/exacerbation and 1-2 comorbidities: schizophrenia and anxiety  are also affecting patient's functional outcome.    REHAB POTENTIAL: Good   CLINICAL DECISION MAKING: Evolving/moderate complexity   EVALUATION COMPLEXITY: Moderate     GOALS: Goals reviewed with patient? Yes   SHORT TERM GOALS: Target date: 01/21/2022   Patient will be independent in home exercise program to improve strength/mobility for better functional independence with ADLs. Baseline:   02/11/22: Pt is compliant with his HEP  Goal status: ACHIEVED   2.  Pt will ambulate 200+ feet using RW without LOB or LLE buckling to demonstrate progression towards improved ambulatory endurance.  Baseline: 171ft .    02/11/22: Pt able to ambulate > 200 feet without LLE buckling Goal status: ACHIEVED   3.   Patient will deny any falls over past 4 weeks to demonstrate improved safety at home.  Baseline: 4 falls since surgery.  02/11/22: 2 falls in June.   03/04/22: No falls in previous month.  Goal status: ACHIEVED     LONG TERM GOALS: Target date: 03/04/2022   Patient will increase FOTO score to equal to or greater than 41 to demonstrate statistically significant improvement in mobility and quality of life.  Baseline: 01/07/22:  23/41.  7/11/2: 44/41 Goal status: ACHIEVED   2.   Patient (< 59 years old) will complete five times sit to stand test in < 10 seconds indicating an increased LE strength and improved balance. Baseline: 01/07/22: 43 seconds.   02/11/22: 36.1 sec.  03/04/22: 27.4 seconds.  Goal status: IN PROGRESS   3.  Patient will increase 10 meter walk test to >1.86m/s as to improve gait speed for better community ambulation and to reduce fall risk. Baseline: 01/07/22: 0.41 m/s.   02/11/22: 0.65 m/s.    03/04/22: 0.65 m/s fast speed Goal status: IN PROGRESS/NO CHANGE FROM PREVIOUS RE-ASSESSMENT   4.  Patient will increase six minute walk test distance to >1000 for progression to community ambulator and improve gait ability. Baseline: 01/14/22: 280 feet.  02/11/22: 600 feet.   03/04/22: 735 feet. Goal status: IN PROGRESS   5.  Patient will increase LLE gross strength to 4/5 as to improve functional strength for independent gait, increased standing tolerance and increased ADL ability. Baseline: 01/07/22: ranging 3-4/5, please see objective.   02/11/22: 4 to 4+/5 MMTs, see above.  Goal status: ACHIEVED         PLAN: PT FREQUENCY: 2x/week   PT DURATION: 4-6 weeks   PLANNED INTERVENTIONS: Therapeutic exercises, Therapeutic activity, Neuromuscular re-education, Balance training, Gait training, Patient/Family education, Joint mobilization, Stair training, DME instructions, Dry Needling, Cryotherapy, Moist heat, and Manual therapy.   PLAN FOR NEXT SESSION: LE strengthening, trunk stabilization, LE flexibility. Progress with increasing emphasis on standing/weightbearing activity and continued AD wean as able from [employer] to no AD. Modalities and manual therapy for pain modulation as needed.     Consuela Mimes, PT, DPT #[id number]  Gertie Exon, PT 03/12/2022, 3:07 PM

## 2022-03-12 ENCOUNTER — Other Ambulatory Visit: Payer: Self-pay

## 2022-03-12 DIAGNOSIS — Z981 Arthrodesis status: Secondary | ICD-10-CM

## 2022-03-13 ENCOUNTER — Encounter: Payer: Self-pay | Admitting: Neurosurgery

## 2022-03-13 ENCOUNTER — Ambulatory Visit
Admission: RE | Admit: 2022-03-13 | Discharge: 2022-03-13 | Disposition: A | Discharge status: Home / Self Care | Payer: Medicare Other | Source: Ambulatory Visit | Attending: Internal Medicine | Admitting: Internal Medicine

## 2022-03-13 ENCOUNTER — Ambulatory Visit
Admission: RE | Admit: 2022-03-13 | Discharge: 2022-03-13 | Disposition: A | Discharge status: Home / Self Care | Payer: Medicare Other | Source: Ambulatory Visit | Attending: Neurosurgery | Admitting: Neurosurgery

## 2022-03-13 ENCOUNTER — Ambulatory Visit: Payer: Medicare Other | Admitting: Neurosurgery

## 2022-03-13 ENCOUNTER — Encounter: Payer: Medicare Other | Admitting: Physical Therapy

## 2022-03-13 VITALS — BP 145/98 | HR 77 | Ht 72.0 in | Wt 149.2 lb

## 2022-03-13 DIAGNOSIS — Z09 Encounter for follow-up examination after completed treatment for conditions other than malignant neoplasm: Secondary | ICD-10-CM | POA: Diagnosis not present

## 2022-03-13 DIAGNOSIS — Z981 Arthrodesis status: Secondary | ICD-10-CM

## 2022-03-13 DIAGNOSIS — M5416 Radiculopathy, lumbar region: Secondary | ICD-10-CM | POA: Diagnosis not present

## 2022-03-13 DIAGNOSIS — M4310 Spondylolisthesis, site unspecified: Secondary | ICD-10-CM | POA: Diagnosis not present

## 2022-03-13 NOTE — Progress Notes (Signed)
   DOS: 11/18/21 (L4-5 XLIF/PSF)  HISTORY OF PRESENT ILLNESS: 03/13/2022 [name] is status post the above surgery.  He is doing well, though he still has some numbness in his left lateral thigh.  He is much better than he was prior to surgery.  Unfortunately he did start back smoking in late May 2023.  He is currently doing physical therapy and getting good benefit from this.  PHYSICAL EXAMINATION:   Vitals:   03/13/22 0941  BP: (!) 145/98  Pulse: 77   General: Patient is well developed, well nourished, calm, collected, and in no apparent distress.  NEUROLOGICAL:  General: In no acute distress.  Awake, alert, oriented to person, place, and time. Pupils equal round and reactive to light.   Strength:  Side Iliopsoas Quads Hamstring PF DF EHL  R 5 5 5 5 5 5   L 5 5 5 5 5 5    Incision c/d/i   ROS (Neurologic): Negative except as noted above  IMAGING: No complications noted  ASSESSMENT/PLAN:  Justin Nixon is doing well after lumbar spine surgery.  I would like to continue his physical therapy.  I strongly advised him to discontinue smoking for general health and for the healing of the surgery.  I would like to see him back in approximately 6 months.  I spent a total of 15 minutes in face-to-face and non-face-to-face activities related to this patient's care today.   MD, [facility] Department of Neurosurgery

## 2022-03-17 ENCOUNTER — Ambulatory Visit: Payer: Medicare Other | Admitting: Physical Therapy

## 2022-03-18 ENCOUNTER — Ambulatory Visit: Payer: Medicare Other | Admitting: Physical Therapy

## 2022-03-18 DIAGNOSIS — Z981 Arthrodesis status: Secondary | ICD-10-CM

## 2022-03-18 DIAGNOSIS — M545 Low back pain, unspecified: Secondary | ICD-10-CM

## 2022-03-18 DIAGNOSIS — R2681 Unsteadiness on feet: Secondary | ICD-10-CM

## 2022-03-18 DIAGNOSIS — M6281 Muscle weakness (generalized): Secondary | ICD-10-CM | POA: Diagnosis not present

## 2022-03-18 DIAGNOSIS — R269 Unspecified abnormalities of gait and mobility: Secondary | ICD-10-CM

## 2022-03-18 NOTE — Therapy (Signed)
OUTPATIENT PHYSICAL THERAPY TREATMENT AND PROGRESS NOTE   Dates of reporting period  02/11/22   to   03/18/22    Patient Name: Justin Nixon MRN: [id number] DOB:January 03, 1976, 46 y.o., male Today's Date: 03/18/2022  PCP: Baxter Hire, MD REFERRING PROVIDER: Meade Maw, MD  END OF SESSION:   PT End of Session - 03/18/22 1034     Visit Number 20    Number of Visits 25    Date for PT Re-Evaluation 04/03/22    Progress Note Due on Visit 10    PT Start Time 0901    PT Stop Time 0945    PT Time Calculation (min) 44 min    Equipment Utilized During Treatment Gait belt    Activity Tolerance Patient limited by fatigue;Patient limited by pain    Behavior During Therapy WFL for tasks assessed/performed             Past Medical History:  Diagnosis Date   Anxiety    Arthritis    GERD (gastroesophageal reflux disease)    H/O   Hyperlipidemia    Schizophrenia ([place])    Past Surgical History:  Procedure Laterality Date   ANTERIOR LATERAL LUMBAR FUSION WITH PERCUTANEOUS SCREW 1 LEVEL N/A 11/18/2021   Procedure: L4-5 LATERAL LUMBAR INTERBODY FUSION WITH POSTERIOR SPINAL FUSION;  Surgeon: Meade Maw, MD;  Location: [facility];  Service: Neurosurgery;  Laterality: N/A;   APPLICATION OF INTRAOPERATIVE CT SCAN N/A 11/18/2021   Procedure: APPLICATION OF INTRAOPERATIVE CT SCAN;  Surgeon: Meade Maw, MD;  Location: [facility];  Service: Neurosurgery;  Laterality: N/A;   COLONOSCOPY     CYST EXCISION     ELBOW ARTHROSCOPY Left 10/10/2020   Procedure: LEFT ELBOW ARTHROSCOPY WITH DEBRIDEMENT AND EXCISION OF LOOSE BODIES;  Surgeon: Corky Mull, MD;  Location: [facility];  Service: Orthopedics;  Laterality: Left;   MYRINGOTOMY WITH TUBE PLACEMENT     ORIF ELBOW FRACTURE Left 08/28/2021   Procedure: OPEN REDUCTION INTERNAL FIXATION (ORIF) ELBOW/OLECRANON FRACTURE;  Surgeon: Corky Mull, MD;  Location: [facility];  Service: Orthopedics;  Laterality: Left;   SHOULDER ARTHROSCOPY  Left 03/18/2017   Procedure: ARTHROSCOPY SHOULDER with debridement decompression and distal clavicle excision;  Surgeon: Corky Mull, MD;  Location: [place];  Service: Orthopedics;  Laterality: Left;   SHOULDER ARTHROSCOPY WITH OPEN ROTATOR CUFF REPAIR Right 09/23/2016   Procedure: SHOULDER ARTHROSCOPY WITH OPEN ROTATOR CUFF REPAIR;  Surgeon: Corky Mull, MD;  Location: [facility];  Service: Orthopedics;  Laterality: Right;   SHOULDER ARTHROSCOPY WITH SUBACROMIAL DECOMPRESSION Right 09/23/2016   Procedure: SHOULDER ARTHROSCOPY WITH SUBACROMIAL DECOMPRESSION AND DEBRIDEMENT;  Surgeon: Corky Mull, MD;  Location: [facility];  Service: Orthopedics;  Laterality: Right;   Patient Active Problem List   Diagnosis Date Noted   S/P spinal fusion 11/18/2021   Olecranon fracture, left, open type I or II, initial encounter 08/28/2021       REFERRING DIAG: Z98.1 (ICD-10-CM) - History of lumbar fusion   THERAPY DIAG:  Muscle weakness (generalized)   S/P lumbar fusion   Abnormality of gait and mobility   Unsteadiness on feet   Rationale for Evaluation and Treatment Rehabilitation   PERTINENT HISTORY: Pt presents to physical therapy for evaluation of low back and LE pain and weakness L>R, following L4-L5 XLIF/PSF on 11/18/21. Pt states he was using a cane before surgery due to onset of LLE weakness. States he has now been using a RW since surgery. Pain ranges from a  5/10 to a 10/10. When aggravated pain is described as sharp and shooting into LLE; at baseline, pain is achy. Wearing clothes such as jeans is painful due to sensitivity of skin; at home wears pajamas. Pt has not returned to driving; he is limited to household distance ambulation or maximum into a doctors office. He feels as though his LLE is going to "give out" when he is walking or weightbearing. 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 Reports frequent falling since surgery due to LLE giving out; no injuries associated with falls.    PRECAUTIONS: None at this time following 03/13/22 visit with Dr. Izora Ribas     OBJECTIVE: (objective measures completed at initial evaluation unless otherwise dated)   DIAGNOSTIC FINDINGS:  "Intraoperative images during L4-L5 anterior and posterior fusion. No evidence of immediate complication"   PATIENT SURVEYS:  Modified Oswestry (updated 01/09/22 due to incomplete form): 62%.   02/11/22: 48%.  03/04/22: 28% FOTO: eval 23/41,   02/11/22: 44/41   SCREENING FOR RED FLAGS: Bowel or bladder incontinence: No Spinal tumors: No Cauda equina syndrome: No Compression fracture: No Abdominal aneurysm: No   COGNITION:           Overall cognitive status: Within functional limits for tasks assessed                          SENSATION: Light touch: Impaired  - increased sensitivity in left L2 and L3 dermatomes    MUSCLE LENGTH: Hamstrings: unable to test due to increase in sharp/shooting pain in hip flexors prior to HS stretch being felt.  (01/28/22): R lacking 30 deg, L lacking 40 deg.   Thomas test: NT   POSTURE: decreased lumbar lordosis and posterior pelvic tilt   PALPATION: Right: hip flexors and lateral hip, glute med, piriformis, deep external rotators, QL Left: hip flexors, lateral hip and adductors (from proximal hip to mid-thigh), proximal hamstrings, L4-distal paraspinals, QL, all gluteals   OBSERVATION 01/21/22: No bruising, ecchymosis, increased tissue temperature, or erythema observed along L flank, L gluteal region, or adjacent to surgical incisions. No sign of infection or delayed healing   LUMBAR ROM: not assessed due to back precautions    Active  A/PROM  eval  Flexion -  Extension -  Right lateral flexion -  Left lateral flexion -  Right rotation -  Left rotation -   (Blank rows = not tested)   LOWER EXTREMITY ROM:      Passive  Right eval  Left eval  Hip flexion 100 90  Hip extension NT NT  Hip abduction 20 20  Hip adduction      Hip internal rotation      Hip external rotation      Knee flexion [facility]  Knee extension [facility]  Ankle dorsiflexion [facility]  Ankle plantarflexion      Ankle inversion      Ankle eversion       (Blank rows = not tested)   LOWER EXTREMITY MMT:     MMT Right eval Left eval Right 02/11/22 Left 02/11/22  Hip flexion 4-* 4-* 4+ 4*  Hip extension          Hip abduction 4+* 4-* 4+ 4+  Hip adduction 4* 4* 4+ 4+  Hip internal rotation 5 3* 5 4*  Hip external rotation 5 4* 5 4*  Knee flexion  5 4* 5 4+  Knee extension 5 4+ 5 4  Ankle dorsiflexion 4+ 3+ 5 4  Ankle plantarflexion           (Blank rows = not tested)     FUNCTIONAL TESTS:  5 times sit to stand: 43.37 seconds using arms.    02/11/22: 36.1 seconds.  03/04/22: 27.4 seconds.  6 minute walk test (performed 01/14/22): 280 feet.     02/11/22: 600 feet.  03/04/22: 735 feet. 10 meter walk test: 24.3 seconds (comfortable) = 0.41 m/s using RW; terminated fast-pace for safety.   02/11/22: 0.64 m/s comfortable speed, 0.65 m/s fast speed.  03/04/22: 0.65 m/s fast speed   STS mechanics: no UE support, use of momentum with rapid hip hinge to initiate sit to stand. Intermittent LLE buckling and varus/valgus immediately following initiation of sit to stand             -03/04/22: After 2 attempts pt able to perform sit to stand with no UE support, upward velocity of transfers slows down halfway through transfer with intermittent LLE varus/valgus   GAIT: Distance walked: 735 feet Assistive device utilized: SPC Level of assistance: standby assist Comments: good heel to toe pattern, intermittent LLE buckling requiring pt holding wall and intermittently placing hands on his L thigh for support; no assist required from PT to prevent fall         TODAY'S TREATMENT    SUBJECTIVE: Patient reports no significant pain at arrival to PT. He reports  intermittently having pain in L lower abdominal quadrant, which he discussed with his MD and was told to use aspirin. Patient reports he feels that he is making "pretty good" progress, but he does still have intermittent LLE buckling. Patient reports 70% SANE score presently. Patient reports he needs to walk better and be able to stand on LLE without it buckling; he also wants to be able to bend without significant pain.      PAIN:  Are you having pain? No Pain location: Pain in L thigh and knee, L hip          Therapeutic Exercise - for improved soft tissue flexibility and extensibility as needed for ROM, improved strength as needed to improve performance of CKC activities/functional movements   *Goal update performed and performance-based outcome measures assessed (see long-term goals)    SciFit seated elliptical; Level 4; seat at 10, 5 minutes - subjective information gathered periodically during this time, 1 minute not billed    Adjacent to treadmill armrest: 3-way Hip with Red Tband at ankles;1x8 ea dir each LE  Full Tandem stand on Airex pad; 2x30sec - more challenging with LLE in back with intermittent UE touch required Forward step up to 6-inch step, staircase in center of clinic ; 2x10 with LLE    Straight leg raise, supine in low mat; 2x10   PATIENT EDUCATION: Discussed current progress in PT and educated patient on PT plan of care and prognosis      *next visit* Forward/backward stepping with therapist guarding patient posteriorly in case of LLE buckling, x 2 D/B 70-ft course Hurdle steps, consecutive; mixed height hurdles, 3 6-inch and 2 12-inch hurdles; 5x D/B Gait with side-to-side ball toss; 3x D/B 70-ft hallway   Sit to stand from standard chair; maintaining neutral spine; 2x10     *not today* Adjacent to single parallel bar, seated on blue physioball, alternating shoulder and hip flexion (opposite sides) with upright posture; 2x10 alternating  Lower  trunk rotations in hooklying; 2x10 alternating R/L Bird dog; quadruped; 1x8 alternating Dynamic forward march with contralateral knee touch; 4x D/B length of bars Sidestep with minimal upper limb support, careful guarding with intermittent LLE buckling; x4 D/B 30-ft course Supine dead bug with alternating knee extension/shoulder flexion; 2x10 alternating 3 6-inch hurdles consecutive step-over; step-to pattern x 2 D/B, reciprocal pattern x2 D/B Consecutive forward step-up to Airex pad, 3 Airex pads in // bars; 4x D/B                 Piriformis stretch; 20x, 1 sec Active hamstring stretch (glider technique): 2x10 on each LE Gait with SPC following demo from PT; ambulate around gym x 1 lap with VC for cane/foot sequencing NuStep, seat at 10 and arms at 11; Level 3 resistance; 5 minutes  Dynamic march 3x D/B length of // bars Standing march with bilateral light touch on single parallel bar for complete weight shift to each lower limb; x10 alternating Seated hip abduction with Grn Tband, with abdominal brace and upright posture/neutral spine; 2x10 Abdominal brace with Bridge c neutral spine,  full range; 2x10 Abdominal brace with alternating march and shoulder flexion; 2x10 alternating R/L Standing toe tap onto 6-inch step; 2x10 alternating R/L for weight shifting and weight acceptance onto L lower limb Standing heel raise; 2x10 with bilat support on front-wheeled walker Abdominal brace/TrA contraction with alternating adductor ball squeeze/hip ABD iso against belt; 2x10, 5 sec  6-minute walk with front-wheel walker Abdominal brace/TrA contraction; x10, 5 sec Glute set; 2x10, 5 sec           HOME EXERCISE PROGRAM: Access Code [email or web address]       ASSESSMENT:   CLINICAL IMPRESSION: Pt had good post-operative follow-up with Dr. Izora Ribas this past week; good post-op radiographs. Patient has no specific restrictions or precautions at this time. Patient reports minimal pain at arrival today, but  he does experience intermittent back pain with bending. He has been able to wean from his walker and can negotiate home distance with minimal reliance on AD; however, he can experience sudden buckling when performing standing activities (e.g. cooking in kitchen). Pt is currently using his cane for most standing/ambulatory activity as precaution. He has continued to improve his 5-times sit to stand, gait speed, and 6-minute walk test. Pt has met FOTO goal per previous goal update. He needs continued work on strengthening and restoration of walking endurance. Pt needs continued work on strengthening and restoration of gait stability to get to his PLOF. Pt will benefit from skilled physical therapy to address the noted deficits in order to increase safety with functional activities and allow pt to return to work and home duties.      OBJECTIVE IMPAIRMENTS Abnormal gait, decreased activity tolerance, decreased balance, decreased endurance, decreased mobility, difficulty walking, decreased ROM, decreased strength, decreased safety awareness, impaired flexibility, impaired sensation, postural dysfunction, and pain.    ACTIVITY LIMITATIONS carrying, lifting, bending, sitting, standing, squatting, stairs, and transfers   PARTICIPATION LIMITATIONS: cleaning, laundry, driving, shopping, community activity, occupation, and yard work   PERSONAL FACTORS Time since onset of injury/illness/exacerbation and 1-2 comorbidities: schizophrenia and anxiety  are also affecting patient's functional outcome.    REHAB POTENTIAL: Good   CLINICAL DECISION MAKING: Evolving/moderate complexity   EVALUATION COMPLEXITY: Moderate     GOALS: Goals reviewed with patient? Yes   SHORT TERM GOALS: Target date: 01/21/2022   Patient will be independent in home exercise program to improve strength/mobility for better functional independence  with ADLs. Baseline:   02/11/22: Pt is compliant with his HEP  Goal status: ACHIEVED   2.   Pt will ambulate 200+ feet using RW without LOB or LLE buckling to demonstrate progression towards improved ambulatory endurance.  Baseline: 185ft .    02/11/22: Pt able to ambulate > 200 feet without LLE buckling Goal status: ACHIEVED   3.   Patient will deny any falls over past 4 weeks to demonstrate improved safety at home.  Baseline: 4 falls since surgery.   02/11/22: 2 falls in June.   03/04/22: No falls in previous month.  Goal status: ACHIEVED     LONG TERM GOALS: Target date: 03/04/2022   Patient will increase FOTO score to equal to or greater than 41 to demonstrate statistically significant improvement in mobility and quality of life.  Baseline: 01/07/22: 23/41.  7/11/2: 44/41 Goal status: ACHIEVED   2.   Patient (< 42 years old) will complete five times sit to stand test in < 10 seconds indicating an increased LE strength and improved balance. Baseline: 01/07/22: 43 seconds.   02/11/22: 36.1 sec.  03/04/22: 27.4 seconds.    03/18/22: 21.6 seconds.  Goal status: IN PROGRESS   3.  Patient will increase 10 meter walk test to >1.31m/s as to improve gait speed for better community ambulation and to reduce fall risk. Baseline: 01/07/22: 0.41 m/s.   02/11/22: 0.65 m/s.    03/04/22: 0.65 m/s fast speed   03/18/22: 0.90 m/s fast speed Goal status: IN PROGRESS   4.  Patient will increase six minute walk test distance to >1000 for progression to community ambulator and improve gait ability. Baseline: 01/14/22: 280 feet.  02/11/22: 600 feet.   03/04/22: 735 feet.   03/18/22: 840 feet.  Goal status: IN PROGRESS   5.  Patient will increase LLE gross strength to 4/5 as to improve functional strength for independent gait, increased standing tolerance and increased ADL ability. Baseline: 01/07/22: ranging 3-4/5, please see objective.   02/11/22: 4 to 4+/5 MMTs, see above.  Goal status: ACHIEVED         PLAN: PT FREQUENCY: 2x/week   PT DURATION: 4-6 weeks   PLANNED INTERVENTIONS: Therapeutic exercises, Therapeutic  activity, Neuromuscular re-education, Balance training, Gait training, Patient/Family education, Joint mobilization, Stair training, DME instructions, Dry Needling, Cryotherapy, Moist heat, and Manual therapy.   PLAN FOR NEXT SESSION: LE strengthening, trunk stabilization, LE flexibility. Progress with increasing emphasis on standing/weightbearing activity and continued AD wean as able from [facility] to no AD. Modalities and manual therapy for pain modulation as needed.  Recommend continued PT 2x/week for 4 weeks.        Valentina Gu, PT, DPT #[id number]  Eilleen Kempf, PT 03/18/2022, 10:35 AM

## 2022-03-20 ENCOUNTER — Ambulatory Visit: Payer: Medicare Other | Admitting: Physical Therapy

## 2022-03-20 DIAGNOSIS — M545 Low back pain, unspecified: Secondary | ICD-10-CM

## 2022-03-20 DIAGNOSIS — Z981 Arthrodesis status: Secondary | ICD-10-CM

## 2022-03-20 DIAGNOSIS — M6281 Muscle weakness (generalized): Secondary | ICD-10-CM | POA: Diagnosis not present

## 2022-03-20 DIAGNOSIS — R2681 Unsteadiness on feet: Secondary | ICD-10-CM

## 2022-03-20 DIAGNOSIS — R269 Unspecified abnormalities of gait and mobility: Secondary | ICD-10-CM

## 2022-03-20 NOTE — Therapy (Signed)
OUTPATIENT PHYSICAL THERAPY TREATMENT NOTE   Patient Name: Justin Nixon MRN: [id number] DOB:07-31-1976, 46 y.o., male Today's Date: 03/20/2022  PCP: Gracelyn Nurse, MD REFERRING PROVIDER: Venetia Night, MD  END OF SESSION:   PT End of Session - 03/20/22 1027     Visit Number 21    Number of Visits 28    Date for PT Re-Evaluation 04/24/22    Progress Note Due on Visit 28    PT Start Time 1028    PT Stop Time 1111    PT Time Calculation (min) 43 min    Equipment Utilized During Treatment Gait belt    Activity Tolerance Patient limited by fatigue;Patient limited by pain    Behavior During Therapy WFL for tasks assessed/performed             Past Medical History:  Diagnosis Date   Anxiety    Arthritis    GERD (gastroesophageal reflux disease)    H/O   Hyperlipidemia    Schizophrenia (HCC)    Past Surgical History:  Procedure Laterality Date   ANTERIOR LATERAL LUMBAR FUSION WITH PERCUTANEOUS SCREW 1 LEVEL N/A 11/18/2021   Procedure: L4-5 LATERAL LUMBAR INTERBODY FUSION WITH POSTERIOR SPINAL FUSION;  Surgeon: Venetia Night, MD;  Location: [facility];  Service: Neurosurgery;  Laterality: N/A;   APPLICATION OF INTRAOPERATIVE CT SCAN N/A 11/18/2021   Procedure: APPLICATION OF INTRAOPERATIVE CT SCAN;  Surgeon: Venetia Night, MD;  Location: [facility];  Service: Neurosurgery;  Laterality: N/A;   COLONOSCOPY     CYST EXCISION     ELBOW ARTHROSCOPY Left 10/10/2020   Procedure: LEFT ELBOW ARTHROSCOPY WITH DEBRIDEMENT AND EXCISION OF LOOSE BODIES;  Surgeon: Christena Flake, MD;  Location: [facility];  Service: Orthopedics;  Laterality: Left;   MYRINGOTOMY WITH TUBE PLACEMENT     ORIF ELBOW FRACTURE Left 08/28/2021   Procedure: OPEN REDUCTION INTERNAL FIXATION (ORIF) ELBOW/OLECRANON FRACTURE;  Surgeon: Christena Flake, MD;  Location: [facility];  Service: Orthopedics;  Laterality: Left;   SHOULDER ARTHROSCOPY Left 03/18/2017   Procedure: ARTHROSCOPY SHOULDER with debridement  decompression and distal clavicle excision;  Surgeon: Christena Flake, MD;  Location: [facility];  Service: Orthopedics;  Laterality: Left;   SHOULDER ARTHROSCOPY WITH OPEN ROTATOR CUFF REPAIR Right 09/23/2016   Procedure: SHOULDER ARTHROSCOPY WITH OPEN ROTATOR CUFF REPAIR;  Surgeon: Christena Flake, MD;  Location: [facility];  Service: Orthopedics;  Laterality: Right;   SHOULDER ARTHROSCOPY WITH SUBACROMIAL DECOMPRESSION Right 09/23/2016   Procedure: SHOULDER ARTHROSCOPY WITH SUBACROMIAL DECOMPRESSION AND DEBRIDEMENT;  Surgeon: Christena Flake, MD;  Location: [facility];  Service: Orthopedics;  Laterality: Right;   Patient Active Problem List   Diagnosis Date Noted   S/P spinal fusion 11/18/2021   Olecranon fracture, left, open type I or II, initial encounter 08/28/2021    REFERRING DIAG: Z98.1 (ICD-10-CM) - History of lumbar fusion   THERAPY DIAG:  Muscle weakness (generalized)   S/P lumbar fusion   Abnormality of gait and mobility   Unsteadiness on feet   Rationale for Evaluation and Treatment Rehabilitation   PERTINENT HISTORY: Pt presents to physical therapy for evaluation of low back and LE pain and weakness L>R, following L4-L5 XLIF/PSF on 11/18/21. Pt states he was using a cane before surgery due to onset of LLE weakness. States he has now been using a RW since surgery. Pain ranges from a 5/10 to a 10/10. When aggravated pain is described as sharp and shooting into LLE; at baseline, pain is achy.  Wearing clothes such as jeans is painful due to sensitivity of skin; at home wears pajamas. Pt has not returned to driving; he is limited to household distance ambulation or maximum into a doctors office. He feels as though his LLE is going to "give out" when he is walking or weightbearing. 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 Reports frequent falling since surgery due to LLE giving  out; no injuries associated with falls.    PRECAUTIONS: None at this time following 03/13/22 visit with Dr. Myer HaffYarbrough     OBJECTIVE: (objective measures completed at initial evaluation unless otherwise dated)   DIAGNOSTIC FINDINGS:  "Intraoperative images during L4-L5 anterior and posterior fusion. No evidence of immediate complication"   PATIENT SURVEYS:  Modified Oswestry (updated 01/09/22 due to incomplete form): 62%.   02/11/22: 48%.  03/04/22: 28% FOTO: eval 23/41,   02/11/22: 44/41   SCREENING FOR RED FLAGS: Bowel or bladder incontinence: No Spinal tumors: No Cauda equina syndrome: No Compression fracture: No Abdominal aneurysm: No   COGNITION:           Overall cognitive status: Within functional limits for tasks assessed                          SENSATION: Light touch: Impaired  - increased sensitivity in left L2 and L3 dermatomes    MUSCLE LENGTH: Hamstrings: unable to test due to increase in sharp/shooting pain in hip flexors prior to HS stretch being felt.  (01/28/22): R lacking 30 deg, L lacking 40 deg.   Thomas test: NT   POSTURE: decreased lumbar lordosis and posterior pelvic tilt   PALPATION: Right: hip flexors and lateral hip, glute med, piriformis, deep external rotators, QL Left: hip flexors, lateral hip and adductors (from proximal hip to mid-thigh), proximal hamstrings, L4-distal paraspinals, QL, all gluteals   OBSERVATION 01/21/22: No bruising, ecchymosis, increased tissue temperature, or erythema observed along L flank, L gluteal region, or adjacent to surgical incisions. No sign of infection or delayed healing   LUMBAR ROM: not assessed due to back precautions    Active  A/PROM  eval  Flexion -  Extension -  Right lateral flexion -  Left lateral flexion -  Right rotation -  Left rotation -   (Blank rows = not tested)   LOWER EXTREMITY ROM:      Passive  Right eval Left eval  Hip flexion 100 90  Hip extension NT NT  Hip abduction 20 20  Hip  adduction      Hip internal rotation      Hip external rotation      Knee flexion [facility]  Knee extension [facility]  Ankle dorsiflexion [facility]  Ankle plantarflexion      Ankle inversion      Ankle eversion       (Blank rows = not tested)   LOWER EXTREMITY MMT:     MMT Right eval Left eval Right 02/11/22 Left 02/11/22  Hip flexion 4-* 4-* 4+ 4*  Hip extension          Hip abduction 4+* 4-* 4+ 4+  Hip adduction 4* 4* 4+ 4+  Hip internal rotation 5 3* 5 4*  Hip external rotation 5 4* 5 4*  Knee flexion 5 4* 5 4+  Knee extension 5 4+ 5 4  Ankle dorsiflexion 4+ 3+ 5 4  Ankle  plantarflexion           (Blank rows = not tested)     FUNCTIONAL TESTS:  5 times sit to stand: 43.37 seconds using arms.    02/11/22: 36.1 seconds.  03/04/22: 27.4 seconds.  6 minute walk test (performed 01/14/22): 280 feet.     02/11/22: 600 feet.  03/04/22: 735 feet. 10 meter walk test: 24.3 seconds (comfortable) = 0.41 m/s using RW; terminated fast-pace for safety.   02/11/22: 0.64 m/s comfortable speed, 0.65 m/s fast speed.  03/04/22: 0.65 m/s fast speed   STS mechanics: no UE support, use of momentum with rapid hip hinge to initiate sit to stand. Intermittent LLE buckling and varus/valgus immediately following initiation of sit to stand             -03/04/22: After 2 attempts pt able to perform sit to stand with no UE support, upward velocity of transfers slows down halfway through transfer with intermittent LLE varus/valgus   GAIT: Distance walked: 735 feet Assistive device utilized: SPC Level of assistance: standby assist Comments: good heel to toe pattern, intermittent LLE buckling requiring pt holding wall and intermittently placing hands on his L thigh for support; no assist required from PT to prevent fall         TODAY'S TREATMENT    SUBJECTIVE: Patient reports notable cramping in his L foot/toes about 2 hours after last session and he states this still feels sore today. He reports abdominal pain and  stinging in his back with attempted riding on routes with his wife to [place]. He reports shooting pain down to his L thigh presently. He reports remaining thigh pain on his L side with warm-up today.      PAIN:  Are you having pain? Yes, 4/10 pain at arrival Pain location: Pain in L thigh and knee, L hip    Pt has negative SLR in LLE today    Manual Therapy - for symptom modulation, soft tissue sensitivity and mobility as needed for improved tolerance of loading LLE and improved ability to participate in active PT  IASTM with Theraband roller L vastus lateralis and rectus femoris        Therapeutic Exercise - for improved soft tissue flexibility and extensibility as needed for ROM, improved strength as needed to improve performance of CKC activities/functional movements     SciFit seated elliptical; Level 4; seat at 10, 5 minutes - subjective information gathered periodically during this time, 1 minute not billed   Standing quad stretch; 2x30 sec, in standing, stance on R foot, R hand holding onto table   Supine piriformis stretch, dynamic; 1 sec on, 1 sec off; x20 in figure-4 position for LLE  LLE sciatic flossing; x20, 1 sec; in hooklying     Parallel bars: 3-way Hip with Red Tband at ankles;1x8 ea dir each LE  Staircase in center of gym: Forward step up to 6-inch step, staircase in center of clinic ; 2x10 with LLE     Gait with side-to-side ball toss; 3x D/B 70-ft hallway      *next visit* Hurdle steps, consecutive; mixed height hurdles, 3 6-inch and 2 12-inch hurdles; 5x D/B Tandem walk on long Airex pad; 4x D/B intermittent touch with LLE weakness     *not today* Sit to stand from standard chair; maintaining neutral spine; 2x10   Straight leg raise, supine in low mat; 2x10 Forward/backward stepping with therapist guarding patient posteriorly in case of LLE buckling, x 2 D/B 70-ft course Adjacent  to single parallel bar, seated on blue physioball, alternating  shoulder and hip flexion (opposite sides) with upright posture; 2x10 alternating             Lower trunk rotations in hooklying; 2x10 alternating R/L Bird dog; quadruped; 1x8 alternating Dynamic forward march with contralateral knee touch; 4x D/B length of bars Sidestep with minimal upper limb support, careful guarding with intermittent LLE buckling; x4 D/B 30-ft course Supine dead bug with alternating knee extension/shoulder flexion; 2x10 alternating 3 6-inch hurdles consecutive step-over; step-to pattern x 2 D/B, reciprocal pattern x2 D/B Consecutive forward step-up to Airex pad, 3 Airex pads in // bars; 4x D/B                 Piriformis stretch; 20x, 1 sec Active hamstring stretch (glider technique): 2x10 on each LE Gait with SPC following demo from PT; ambulate around gym x 1 lap with VC for cane/foot sequencing NuStep, seat at 10 and arms at 11; Level 3 resistance; 5 minutes  Dynamic march 3x D/B length of // bars Standing march with bilateral light touch on single parallel bar for complete weight shift to each lower limb; x10 alternating Seated hip abduction with Grn Tband, with abdominal brace and upright posture/neutral spine; 2x10 Abdominal brace with Bridge c neutral spine,  full range; 2x10 Abdominal brace with alternating march and shoulder flexion; 2x10 alternating R/L Standing toe tap onto 6-inch step; 2x10 alternating R/L for weight shifting and weight acceptance onto L lower limb Standing heel raise; 2x10 with bilat support on front-wheeled walker Abdominal brace/TrA contraction with alternating adductor ball squeeze/hip ABD iso against belt; 2x10, 5 sec  6-minute walk with front-wheel walker Abdominal brace/TrA contraction; x10, 5 sec Glute set; 2x10, 5 sec           HOME EXERCISE PROGRAM: Access Code [email or web address]       ASSESSMENT:   CLINICAL IMPRESSION: Pt progressed with quadriceps strengthening and closed-chain drills last visit. He reports L lateral foot and toes  cramping after last session; he reports adequate hydration and nutrition that morning. This is likely associated with significant weakness and muscular fatigue. He has made good progress to date, but he still has intermittent LLE buckling during walking and prolonged weightbearing activity. He needs continued work on strengthening and restoration of walking endurance. Pt needs continued work on strengthening and restoration of gait stability to get to his PLOF. Pt will benefit from skilled physical therapy to address the noted deficits in order to increase safety with functional activities and allow pt to return to work and home duties.      OBJECTIVE IMPAIRMENTS Abnormal gait, decreased activity tolerance, decreased balance, decreased endurance, decreased mobility, difficulty walking, decreased ROM, decreased strength, decreased safety awareness, impaired flexibility, impaired sensation, postural dysfunction, and pain.    ACTIVITY LIMITATIONS carrying, lifting, bending, sitting, standing, squatting, stairs, and transfers   PARTICIPATION LIMITATIONS: cleaning, laundry, driving, shopping, community activity, occupation, and yard work   PERSONAL FACTORS Time since onset of injury/illness/exacerbation and 1-2 comorbidities: schizophrenia and anxiety  are also affecting patient's functional outcome.    REHAB POTENTIAL: Good   CLINICAL DECISION MAKING: Evolving/moderate complexity   EVALUATION COMPLEXITY: Moderate     GOALS: Goals reviewed with patient? Yes   SHORT TERM GOALS: Target date: 01/21/2022   Patient will be independent in home exercise program to improve strength/mobility for better functional independence with ADLs. Baseline:   02/11/22: Pt is compliant with his HEP  Goal status: ACHIEVED   2.  Pt will ambulate 200+ feet using RW without LOB or LLE buckling to demonstrate progression towards improved ambulatory endurance.  Baseline: 140ft .    02/11/22: Pt able to ambulate > 200 feet  without LLE buckling Goal status: ACHIEVED   3.   Patient will deny any falls over past 4 weeks to demonstrate improved safety at home.  Baseline: 4 falls since surgery.   02/11/22: 2 falls in June.   03/04/22: No falls in previous month.  Goal status: ACHIEVED     LONG TERM GOALS: Target date: 03/04/2022   Patient will increase FOTO score to equal to or greater than 41 to demonstrate statistically significant improvement in mobility and quality of life.  Baseline: 01/07/22: 23/41.  7/11/2: 44/41 Goal status: ACHIEVED   2.   Patient (< 18 years old) will complete five times sit to stand test in < 10 seconds indicating an increased LE strength and improved balance. Baseline: 01/07/22: 43 seconds.   02/11/22: 36.1 sec.  03/04/22: 27.4 seconds.    03/18/22: 21.6 seconds.  Goal status: IN PROGRESS   3.  Patient will increase 10 meter walk test to >1.36m/s as to improve gait speed for better community ambulation and to reduce fall risk. Baseline: 01/07/22: 0.41 m/s.   02/11/22: 0.65 m/s.    03/04/22: 0.65 m/s fast speed   03/18/22: 0.90 m/s fast speed Goal status: IN PROGRESS   4.  Patient will increase six minute walk test distance to >1000 for progression to community ambulator and improve gait ability. Baseline: 01/14/22: 280 feet.  02/11/22: 600 feet.   03/04/22: 735 feet.   03/18/22: 840 feet.  Goal status: IN PROGRESS   5.  Patient will increase LLE gross strength to 4/5 as to improve functional strength for independent gait, increased standing tolerance and increased ADL ability. Baseline: 01/07/22: ranging 3-4/5, please see objective.   02/11/22: 4 to 4+/5 MMTs, see above.  Goal status: ACHIEVED         PLAN: PT FREQUENCY: 2x/week   PT DURATION: 4-6 weeks   PLANNED INTERVENTIONS: Therapeutic exercises, Therapeutic activity, Neuromuscular re-education, Balance training, Gait training, Patient/Family education, Joint mobilization, Stair training, DME instructions, Dry Needling, Cryotherapy, Moist heat,  and Manual therapy.   PLAN FOR NEXT SESSION: LE strengthening, trunk stabilization, LE flexibility. Progress with increasing emphasis on standing/weightbearing activity and continued AD wean as able from [facility] to no AD. Modalities and manual therapy for pain modulation as needed.            Consuela Mimes, PT, DPT #[id number]  Gertie Exon, PT 03/20/2022, 10:27 AM

## 2022-03-24 ENCOUNTER — Encounter: Payer: Self-pay | Admitting: Physical Therapy

## 2022-03-25 ENCOUNTER — Ambulatory Visit: Payer: Medicare Other | Admitting: Physical Therapy

## 2022-03-25 DIAGNOSIS — R2681 Unsteadiness on feet: Secondary | ICD-10-CM

## 2022-03-25 DIAGNOSIS — M6281 Muscle weakness (generalized): Secondary | ICD-10-CM

## 2022-03-25 DIAGNOSIS — R269 Unspecified abnormalities of gait and mobility: Secondary | ICD-10-CM

## 2022-03-25 DIAGNOSIS — Z981 Arthrodesis status: Secondary | ICD-10-CM

## 2022-03-25 DIAGNOSIS — M545 Low back pain, unspecified: Secondary | ICD-10-CM

## 2022-03-25 NOTE — Therapy (Signed)
OUTPATIENT PHYSICAL THERAPY TREATMENT NOTE   Patient Name: Justin Nixon MRN: [id number] DOB:02/06/1976, 46 y.o., male Today's Date: 03/26/2022   END OF SESSION:   PT End of Session - 03/25/22 1033     Visit Number 22    Number of Visits 28    Date for PT Re-Evaluation 04/24/22    Progress Note Due on Visit 28    PT Start Time 1031    PT Stop Time 1115    PT Time Calculation (min) 44 min    Equipment Utilized During Treatment Gait belt    Activity Tolerance Patient limited by fatigue;Patient limited by pain    Behavior During Therapy WFL for tasks assessed/performed             Past Medical History:  Diagnosis Date   Anxiety    Arthritis    GERD (gastroesophageal reflux disease)    H/O   Hyperlipidemia    Schizophrenia ([place])    Past Surgical History:  Procedure Laterality Date   ANTERIOR LATERAL LUMBAR FUSION WITH PERCUTANEOUS SCREW 1 LEVEL N/A 11/18/2021   Procedure: L4-5 LATERAL LUMBAR INTERBODY FUSION WITH POSTERIOR SPINAL FUSION;  Surgeon: Meade Maw, MD;  Location: [facility];  Service: Neurosurgery;  Laterality: N/A;   APPLICATION OF INTRAOPERATIVE CT SCAN N/A 11/18/2021   Procedure: APPLICATION OF INTRAOPERATIVE CT SCAN;  Surgeon: Meade Maw, MD;  Location: [facility];  Service: Neurosurgery;  Laterality: N/A;   COLONOSCOPY     CYST EXCISION     ELBOW ARTHROSCOPY Left 10/10/2020   Procedure: LEFT ELBOW ARTHROSCOPY WITH DEBRIDEMENT AND EXCISION OF LOOSE BODIES;  Surgeon: Corky Mull, MD;  Location: [facility];  Service: Orthopedics;  Laterality: Left;   MYRINGOTOMY WITH TUBE PLACEMENT     ORIF ELBOW FRACTURE Left 08/28/2021   Procedure: OPEN REDUCTION INTERNAL FIXATION (ORIF) ELBOW/OLECRANON FRACTURE;  Surgeon: Corky Mull, MD;  Location: [facility];  Service: Orthopedics;  Laterality: Left;   SHOULDER ARTHROSCOPY Left 03/18/2017   Procedure: ARTHROSCOPY SHOULDER with debridement decompression and distal clavicle excision;  Surgeon: Corky Mull, MD;   Location: [place];  Service: Orthopedics;  Laterality: Left;   SHOULDER ARTHROSCOPY WITH OPEN ROTATOR CUFF REPAIR Right 09/23/2016   Procedure: SHOULDER ARTHROSCOPY WITH OPEN ROTATOR CUFF REPAIR;  Surgeon: Corky Mull, MD;  Location: [facility];  Service: Orthopedics;  Laterality: Right;   SHOULDER ARTHROSCOPY WITH SUBACROMIAL DECOMPRESSION Right 09/23/2016   Procedure: SHOULDER ARTHROSCOPY WITH SUBACROMIAL DECOMPRESSION AND DEBRIDEMENT;  Surgeon: Corky Mull, MD;  Location: [facility];  Service: Orthopedics;  Laterality: Right;   Patient Active Problem List   Diagnosis Date Noted   S/P spinal fusion 11/18/2021   Olecranon fracture, left, open type I or II, initial encounter 08/28/2021   PCP: Baxter Hire MD REFERRING PROVIDER: Meade Maw, MD    REFERRING DIAG: Z98.1 (ICD-10-CM) - History of lumbar fusion   THERAPY DIAG:  Muscle weakness (generalized)   S/P lumbar fusion   Abnormality of gait and mobility   Unsteadiness on feet   Rationale for Evaluation and Treatment Rehabilitation   PERTINENT HISTORY: Pt presents to physical therapy for evaluation of low back and LE pain and weakness L>R, following L4-L5 XLIF/PSF on 11/18/21. Pt states he was using a cane before surgery due to onset of LLE weakness. States he has now been using a RW since surgery. Pain ranges from a 5/10 to a 10/10. When aggravated pain is described as sharp and shooting into LLE; at baseline, pain  is achy. Wearing clothes such as jeans is painful due to sensitivity of skin; at home wears pajamas. Pt has not returned to driving; he is limited to household distance ambulation or maximum into a doctors office. He feels as though his LLE is going to "give out" when he is walking or weightbearing. 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 Reports frequent falling since surgery due to LLE giving  out; no injuries associated with falls.    PRECAUTIONS: None at this time following 03/13/22 visit with Dr. Izora Ribas     OBJECTIVE: (objective measures completed at initial evaluation unless otherwise dated)   DIAGNOSTIC FINDINGS:  "Intraoperative images during L4-L5 anterior and posterior fusion. No evidence of immediate complication"   PATIENT SURVEYS:  Modified Oswestry (updated 01/09/22 due to incomplete form): 62%.   02/11/22: 48%.  03/04/22: 28% FOTO: eval 23/41,   02/11/22: 44/41   SCREENING FOR RED FLAGS: Bowel or bladder incontinence: No Spinal tumors: No Cauda equina syndrome: No Compression fracture: No Abdominal aneurysm: No   COGNITION:           Overall cognitive status: Within functional limits for tasks assessed                          SENSATION: Light touch: Impaired  - increased sensitivity in left L2 and L3 dermatomes    MUSCLE LENGTH: Hamstrings: unable to test due to increase in sharp/shooting pain in hip flexors prior to HS stretch being felt.  (01/28/22): R lacking 30 deg, L lacking 40 deg.   Thomas test: NT   POSTURE: decreased lumbar lordosis and posterior pelvic tilt   PALPATION: Right: hip flexors and lateral hip, glute med, piriformis, deep external rotators, QL Left: hip flexors, lateral hip and adductors (from proximal hip to mid-thigh), proximal hamstrings, L4-distal paraspinals, QL, all gluteals   OBSERVATION 01/21/22: No bruising, ecchymosis, increased tissue temperature, or erythema observed along L flank, L gluteal region, or adjacent to surgical incisions. No sign of infection or delayed healing   LUMBAR ROM: not assessed due to back precautions    Active  A/PROM  eval  Flexion -  Extension -  Right lateral flexion -  Left lateral flexion -  Right rotation -  Left rotation -   (Blank rows = not tested)   LOWER EXTREMITY ROM:      Passive  Right eval Left eval  Hip flexion 100 90  Hip extension NT NT  Hip abduction 20 20  Hip  adduction      Hip internal rotation      Hip external rotation      Knee flexion [facility]  Knee extension [facility]  Ankle dorsiflexion [facility]  Ankle plantarflexion      Ankle inversion      Ankle eversion       (Blank rows = not tested)   LOWER EXTREMITY MMT:     MMT Right eval Left eval Right 02/11/22 Left 02/11/22  Hip flexion 4-* 4-* 4+ 4*  Hip extension          Hip abduction 4+* 4-* 4+ 4+  Hip adduction 4* 4* 4+ 4+  Hip internal rotation 5 3* 5 4*  Hip external rotation 5 4* 5 4*  Knee flexion 5 4* 5 4+  Knee extension 5 4+ 5 4  Ankle dorsiflexion 4+ 3+ 5 4  Ankle plantarflexion           (Blank rows = not tested)     FUNCTIONAL TESTS:  5 times sit to stand: 43.37 seconds using arms.    02/11/22: 36.1 seconds.  03/04/22: 27.4 seconds.  6 minute walk test (performed 01/14/22): 280 feet.     02/11/22: 600 feet.  03/04/22: 735 feet. 10 meter walk test: 24.3 seconds (comfortable) = 0.41 m/s using RW; terminated fast-pace for safety.   02/11/22: 0.64 m/s comfortable speed, 0.65 m/s fast speed.  03/04/22: 0.65 m/s fast speed   STS mechanics: no UE support, use of momentum with rapid hip hinge to initiate sit to stand. Intermittent LLE buckling and varus/valgus immediately following initiation of sit to stand             -03/04/22: After 2 attempts pt able to perform sit to stand with no UE support, upward velocity of transfers slows down halfway through transfer with intermittent LLE varus/valgus   GAIT: Distance walked: 735 feet Assistive device utilized: SPC Level of assistance: standby assist Comments: good heel to toe pattern, intermittent LLE buckling requiring pt holding wall and intermittently placing hands on his L thigh for support; no assist required from PT to prevent fall         TODAY'S TREATMENT    SUBJECTIVE: Patient reports falling onto his L knee at home when dog ran between his legs. He had mild abrasion on L knee, but no apparent skin injury there now. He reports  poor sleep last night with his back hurting all weekend. He reports mild sensitivity along his L thigh. R lumbar paraspinal region has bothered him over the weekend. Pt reports disturbed sleep secondary to pain - he states this can be alleviated with change in position, but he has to change positions frequently (tossing and turning).      PAIN:  Are you having pain? Yes, 3/10 pain at arrival Pain location: Pain in L thigh and knee, L hip      Cold pack (unbilled) - for anti-inflammatory and analgesic effect as needed for reduced pain and improved ability to participate in active PT intervention, along R iliolumbar region in left sidelying, x 5 minutes      Thoracolumbar AROM Flexion 75%* Extension 100%* Lateral flexion: R 75%*, L 100% Rotation: R 100%, L 100% (No aberrant motion noted with AROM, pain with return to neutral from flexion and with trunk extension)    Manual Therapy - for symptom modulation, soft tissue sensitivity and mobility as needed for improved tolerance of loading LLE and improved ability to participate in active PT    STM R gluteal musculature and R iliocostalis lumborum and QL x 10 minutes    Therapeutic Exercise - for improved soft tissue flexibility and extensibility as needed for ROM, improved strength as needed to improve performance of CKC activities/functional movements     SciFit seated elliptical; Level 4; seat at 10, 5 minutes - subjective information gathered during this time   Lower trunk rotations in hooklying; 2x10 alternating R/L   Supine piriformis stretch, dynamic; 1 sec on, 1 sec off; x20 in figure-4 position for LLE   Ambulate laps in gym x 3 with SPC in R hand       In hallway: Gait with side-to-side ball toss; 3x D/B 70-ft hallway, SBA c PT  -patient has one episode of significant LLE buckling and abruptly leans into wall and assumes mini-lunge position with PT rapidly holding onto pt around  his trunk, brief rest break was allowed  prior to resumption of walking with dual task    In parallel bars: Hurdle steps, consecutive; mixed height hurdles, 3 6-inch and 2 12-inch hurdles; 5x D/B     *next visit* Forward step up to 6-inch step, staircase in center of clinic ; 2x10 with LLE 3-way Hip with Red Tband at ankles;1x8 ea dir each LE Tandem walk on long Airex pad; 4x D/B intermittent touch with LLE weakness      *not today* LLE sciatic flossing; x20, 1 sec; in hooklying  Standing quad stretch; 2x30 sec, in standing, stance on R foot, R hand holding onto table  Sit to stand from standard chair; maintaining neutral spine; 2x10              Straight leg raise, supine in low mat; 2x10 Forward/backward stepping with therapist guarding patient posteriorly in case of LLE buckling, x 2 D/B 70-ft course Adjacent to single parallel bar, seated on blue physioball, alternating shoulder and hip flexion (opposite sides) with upright posture; 2x10 alternating Bird dog; quadruped; 1x8 alternating Dynamic forward march with contralateral knee touch; 4x D/B length of bars Sidestep with minimal upper limb support, careful guarding with intermittent LLE buckling; x4 D/B 30-ft course Supine dead bug with alternating knee extension/shoulder flexion; 2x10 alternating 3 6-inch hurdles consecutive step-over; step-to pattern x 2 D/B, reciprocal pattern x2 D/B Consecutive forward step-up to Airex pad, 3 Airex pads in // bars; 4x D/B                 Piriformis stretch; 20x, 1 sec Active hamstring stretch (glider technique): 2x10 on each LE Gait with SPC following demo from PT; ambulate around gym x 1 lap with VC for cane/foot sequencing NuStep, seat at 10 and arms at 11; Level 3 resistance; 5 minutes  Dynamic march 3x D/B length of // bars Standing march with bilateral light touch on single parallel bar for complete weight shift to each lower limb; x10 alternating Seated hip abduction with Grn Tband, with abdominal brace and upright  posture/neutral spine; 2x10 Abdominal brace with Bridge c neutral spine,  full range; 2x10 Abdominal brace with alternating march and shoulder flexion; 2x10 alternating R/L Standing toe tap onto 6-inch step; 2x10 alternating R/L for weight shifting and weight acceptance onto L lower limb Standing heel raise; 2x10 with bilat support on front-wheeled walker Abdominal brace/TrA contraction with alternating adductor ball squeeze/hip ABD iso against belt; 2x10, 5 sec  6-minute walk with front-wheel walker Abdominal brace/TrA contraction; x10, 5 sec Glute set; 2x10, 5 sec           HOME EXERCISE PROGRAM: Access Code [email or web address]       ASSESSMENT:   CLINICAL IMPRESSION: Pt unfortunately has had difficulty with participation in PT over the previous week with episodes of cramping and R-sided back pain following recent fall onto his knees in his home; pt exhibits no abrasion or significant integumentary changes at anterior knees. No ecchymosis, erythema, or edema along R or L paraspinal region or axial lumbar spine. No motion loss, gait changes, significant increase in NPRS at today's visit, and no aberrant L-spine AROM. Pt has ongoing L lower quarter pain that has been consistent with his presentation over the previous month, but pt does have increased tenderness along R QL, R iliac crest and PSIS, and R gluteal musculature. Pt tolerates STM well today and is able to resume lower-volume exercise program today; however, he does experience on episode of significant  buckling with near fall requiring abrupt assist from PT and pt rapidly leaning onto adjacent wall. Pt still has significant challenges related to persistent pain, LLE weakness, and hypersensitivity of L thigh. Pt will benefit from skilled physical therapy to address the noted deficits in order to increase safety with functional activities and allow pt to return to work and home duties.      OBJECTIVE IMPAIRMENTS Abnormal gait, decreased activity  tolerance, decreased balance, decreased endurance, decreased mobility, difficulty walking, decreased ROM, decreased strength, decreased safety awareness, impaired flexibility, impaired sensation, postural dysfunction, and pain.    ACTIVITY LIMITATIONS carrying, lifting, bending, sitting, standing, squatting, stairs, and transfers   PARTICIPATION LIMITATIONS: cleaning, laundry, driving, shopping, community activity, occupation, and yard work   PERSONAL FACTORS Time since onset of injury/illness/exacerbation and 1-2 comorbidities: schizophrenia and anxiety  are also affecting patient's functional outcome.    REHAB POTENTIAL: Good   CLINICAL DECISION MAKING: Evolving/moderate complexity   EVALUATION COMPLEXITY: Moderate     GOALS: Goals reviewed with patient? Yes   SHORT TERM GOALS: Target date: 01/21/2022   Patient will be independent in home exercise program to improve strength/mobility for better functional independence with ADLs. Baseline:   02/11/22: Pt is compliant with his HEP  Goal status: ACHIEVED   2.  Pt will ambulate 200+ feet using RW without LOB or LLE buckling to demonstrate progression towards improved ambulatory endurance.  Baseline: 158ft .    02/11/22: Pt able to ambulate > 200 feet without LLE buckling Goal status: ACHIEVED   3.   Patient will deny any falls over past 4 weeks to demonstrate improved safety at home.  Baseline: 4 falls since surgery.   02/11/22: 2 falls in June.   03/04/22: No falls in previous month.  03/25/22: Pt last had fall in home 03/22/22 Goal status: PREVIOUSLY MET     LONG TERM GOALS: Target date: 03/04/2022   Patient will increase FOTO score to equal to or greater than 41 to demonstrate statistically significant improvement in mobility and quality of life.  Baseline: 01/07/22: 23/41.  7/11/2: 44/41 Goal status: ACHIEVED   2.   Patient (< 25 years old) will complete five times sit to stand test in < 10 seconds indicating an increased LE strength and  improved balance. Baseline: 01/07/22: 43 seconds.   02/11/22: 36.1 sec.  03/04/22: 27.4 seconds.    03/18/22: 21.6 seconds.  Goal status: IN PROGRESS   3.  Patient will increase 10 meter walk test to >1.3m/s as to improve gait speed for better community ambulation and to reduce fall risk. Baseline: 01/07/22: 0.41 m/s.   02/11/22: 0.65 m/s.    03/04/22: 0.65 m/s fast speed   03/18/22: 0.90 m/s fast speed Goal status: IN PROGRESS   4.  Patient will increase six minute walk test distance to >1000 for progression to community ambulator and improve gait ability. Baseline: 01/14/22: 280 feet.  02/11/22: 600 feet.   03/04/22: 735 feet.   03/18/22: 840 feet.  Goal status: IN PROGRESS   5.  Patient will increase LLE gross strength to 4/5 as to improve functional strength for independent gait, increased standing tolerance and increased ADL ability. Baseline: 01/07/22: ranging 3-4/5, please see objective.   02/11/22: 4 to 4+/5 MMTs, see above.  Goal status: ACHIEVED         PLAN: PT FREQUENCY: 2x/week   PT DURATION: 4-6 weeks   PLANNED INTERVENTIONS: Therapeutic exercises, Therapeutic activity, Neuromuscular re-education, Balance training, Gait training, Patient/Family education, Joint mobilization, Stair  training, DME instructions, Dry Needling, Cryotherapy, Moist heat, and Manual therapy.   PLAN FOR NEXT SESSION: LE strengthening, trunk stabilization, LE flexibility. Progress with increasing emphasis on standing/weightbearing activity and continued AD wean as able from [facility] to no AD. Modalities and manual therapy for pain modulation as needed.        Valentina Gu, PT, DPT #[id number]  Eilleen Kempf, PT 03/26/2022, 5:26 AM

## 2022-03-26 ENCOUNTER — Encounter: Payer: Self-pay | Admitting: Physical Therapy

## 2022-03-27 ENCOUNTER — Ambulatory Visit: Payer: Medicare Other | Admitting: Physical Therapy

## 2022-03-27 ENCOUNTER — Encounter: Payer: Self-pay | Admitting: Physical Therapy

## 2022-03-27 DIAGNOSIS — M545 Low back pain, unspecified: Secondary | ICD-10-CM

## 2022-03-27 DIAGNOSIS — R2681 Unsteadiness on feet: Secondary | ICD-10-CM

## 2022-03-27 DIAGNOSIS — Z981 Arthrodesis status: Secondary | ICD-10-CM

## 2022-03-27 DIAGNOSIS — R269 Unspecified abnormalities of gait and mobility: Secondary | ICD-10-CM

## 2022-03-27 DIAGNOSIS — M6281 Muscle weakness (generalized): Secondary | ICD-10-CM

## 2022-03-27 NOTE — Therapy (Signed)
OUTPATIENT PHYSICAL THERAPY TREATMENT NOTE   Patient Name: Justin Nixon MRN: [id number] DOB:1975/12/20, 46 y.o., male   END OF SESSION:   PT End of Session - 03/27/22 1128     Visit Number 23    Number of Visits 28    Date for PT Re-Evaluation 04/24/22    Progress Note Due on Visit 28    PT Start Time 1123    PT Stop Time 1205    PT Time Calculation (min) 42 min    Equipment Utilized During Treatment Gait belt    Activity Tolerance Patient limited by fatigue;Patient limited by pain    Behavior During Therapy WFL for tasks assessed/performed             Past Medical History:  Diagnosis Date   Anxiety    Arthritis    GERD (gastroesophageal reflux disease)    H/O   Hyperlipidemia    Schizophrenia ([place])    Past Surgical History:  Procedure Laterality Date   ANTERIOR LATERAL LUMBAR FUSION WITH PERCUTANEOUS SCREW 1 LEVEL N/A 11/18/2021   Procedure: L4-5 LATERAL LUMBAR INTERBODY FUSION WITH POSTERIOR SPINAL FUSION;  Surgeon: Meade Maw, MD;  Location: [facility];  Service: Neurosurgery;  Laterality: N/A;   APPLICATION OF INTRAOPERATIVE CT SCAN N/A 11/18/2021   Procedure: APPLICATION OF INTRAOPERATIVE CT SCAN;  Surgeon: Meade Maw, MD;  Location: [facility];  Service: Neurosurgery;  Laterality: N/A;   COLONOSCOPY     CYST EXCISION     ELBOW ARTHROSCOPY Left 10/10/2020   Procedure: LEFT ELBOW ARTHROSCOPY WITH DEBRIDEMENT AND EXCISION OF LOOSE BODIES;  Surgeon: Corky Mull, MD;  Location: [facility];  Service: Orthopedics;  Laterality: Left;   MYRINGOTOMY WITH TUBE PLACEMENT     ORIF ELBOW FRACTURE Left 08/28/2021   Procedure: OPEN REDUCTION INTERNAL FIXATION (ORIF) ELBOW/OLECRANON FRACTURE;  Surgeon: Corky Mull, MD;  Location: [facility];  Service: Orthopedics;  Laterality: Left;   SHOULDER ARTHROSCOPY Left 03/18/2017   Procedure: ARTHROSCOPY SHOULDER with debridement decompression and distal clavicle excision;  Surgeon: Corky Mull, MD;  Location: [place];  Service: Orthopedics;  Laterality: Left;   SHOULDER ARTHROSCOPY WITH OPEN ROTATOR CUFF REPAIR Right 09/23/2016   Procedure: SHOULDER ARTHROSCOPY WITH OPEN ROTATOR CUFF REPAIR;  Surgeon: Corky Mull, MD;  Location: [facility];  Service: Orthopedics;  Laterality: Right;   SHOULDER ARTHROSCOPY WITH SUBACROMIAL DECOMPRESSION Right 09/23/2016   Procedure: SHOULDER ARTHROSCOPY WITH SUBACROMIAL DECOMPRESSION AND DEBRIDEMENT;  Surgeon: Corky Mull, MD;  Location: [facility];  Service: Orthopedics;  Laterality: Right;   Patient Active Problem List   Diagnosis Date Noted   S/P spinal fusion 11/18/2021   Olecranon fracture, left, open type I or II, initial encounter 08/28/2021      PCP: Baxter Hire MD REFERRING PROVIDER: Meade Maw, MD     REFERRING DIAG: Z98.1 (ICD-10-CM) - History of lumbar fusion   THERAPY DIAG:  Muscle weakness (generalized)   S/P lumbar fusion   Abnormality of gait and mobility   Unsteadiness on feet   Rationale for Evaluation and Treatment Rehabilitation   PERTINENT HISTORY: Pt presents to physical therapy for evaluation of low back and LE pain and weakness L>R, following L4-L5 XLIF/PSF on 11/18/21. Pt states he was using a cane before surgery due to onset of LLE weakness. States he has now been using a RW since surgery. Pain ranges from a 5/10 to a 10/10. When aggravated pain is described as sharp and shooting into LLE; at baseline,  pain is achy. Wearing clothes such as jeans is painful due to sensitivity of skin; at home wears pajamas. Pt has not returned to driving; he is limited to household distance ambulation or maximum into a doctors office. He feels as though his LLE is going to "give out" when he is walking or weightbearing. 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 Reports frequent falling since surgery due to LLE giving out; no injuries  associated with falls.    PRECAUTIONS: None at this time following 03/13/22 visit with Dr. Izora Ribas     OBJECTIVE: (objective measures completed at initial evaluation unless otherwise dated)   DIAGNOSTIC FINDINGS:  "Intraoperative images during L4-L5 anterior and posterior fusion. No evidence of immediate complication"   PATIENT SURVEYS:  Modified Oswestry (updated 01/09/22 due to incomplete form): 62%.   02/11/22: 48%.  03/04/22: 28% FOTO: eval 23/41,   02/11/22: 44/41   SCREENING FOR RED FLAGS: Bowel or bladder incontinence: No Spinal tumors: No Cauda equina syndrome: No Compression fracture: No Abdominal aneurysm: No   COGNITION:           Overall cognitive status: Within functional limits for tasks assessed                          SENSATION: Light touch: Impaired  - increased sensitivity in left L2 and L3 dermatomes    MUSCLE LENGTH: Hamstrings: unable to test due to increase in sharp/shooting pain in hip flexors prior to HS stretch being felt.  (01/28/22): R lacking 30 deg, L lacking 40 deg.   Thomas test: NT   POSTURE: decreased lumbar lordosis and posterior pelvic tilt   PALPATION: Right: hip flexors and lateral hip, glute med, piriformis, deep external rotators, QL Left: hip flexors, lateral hip and adductors (from proximal hip to mid-thigh), proximal hamstrings, L4-distal paraspinals, QL, all gluteals   OBSERVATION 01/21/22: No bruising, ecchymosis, increased tissue temperature, or erythema observed along L flank, L gluteal region, or adjacent to surgical incisions. No sign of infection or delayed healing   LUMBAR ROM: not assessed due to back precautions    Active  A/PROM  eval  Flexion -  Extension -  Right lateral flexion -  Left lateral flexion -  Right rotation -  Left rotation -   (Blank rows = not tested)   LOWER EXTREMITY ROM:      Passive  Right eval Left eval  Hip flexion 100 90  Hip extension NT NT  Hip abduction 20 20  Hip adduction      Hip  internal rotation      Hip external rotation      Knee flexion [facility]  Knee extension [facility]  Ankle dorsiflexion [facility]  Ankle plantarflexion      Ankle inversion      Ankle eversion       (Blank rows = not tested)   LOWER EXTREMITY MMT:     MMT Right eval Left eval Right 02/11/22 Left 02/11/22  Hip flexion 4-* 4-* 4+ 4*  Hip extension          Hip abduction 4+* 4-* 4+ 4+  Hip adduction 4* 4* 4+ 4+  Hip internal rotation 5 3* 5 4*  Hip external rotation 5 4* 5 4*  Knee flexion 5 4* 5 4+  Knee extension 5 4+ 5 4  Ankle dorsiflexion 4+ 3+ 5  4  Ankle plantarflexion           (Blank rows = not tested)     FUNCTIONAL TESTS:  5 times sit to stand: 43.37 seconds using arms.    02/11/22: 36.1 seconds.  03/04/22: 27.4 seconds.  6 minute walk test (performed 01/14/22): 280 feet.     02/11/22: 600 feet.  03/04/22: 735 feet. 10 meter walk test: 24.3 seconds (comfortable) = 0.41 m/s using RW; terminated fast-pace for safety.   02/11/22: 0.64 m/s comfortable speed, 0.65 m/s fast speed.  03/04/22: 0.65 m/s fast speed   STS mechanics: no UE support, use of momentum with rapid hip hinge to initiate sit to stand. Intermittent LLE buckling and varus/valgus immediately following initiation of sit to stand             -03/04/22: After 2 attempts pt able to perform sit to stand with no UE support, upward velocity of transfers slows down halfway through transfer with intermittent LLE varus/valgus   GAIT: Distance walked: 735 feet Assistive device utilized: SPC Level of assistance: standby assist Comments: good heel to toe pattern, intermittent LLE buckling requiring pt holding wall and intermittently placing hands on his L thigh for support; no assist required from PT to prevent fall         TODAY'S TREATMENT    SUBJECTIVE: Patient reports no falls since last visit. He has intermittent R lumbar paraspinal/flank pain. He reports remaining sensitivity along L lumbar paraspinal region.      PAIN:   Are you having pain? Yes, 3/10 pain at arrival Pain location: Pain in L thigh and knee, L hip      Therapeutic Exercise - for improved soft tissue flexibility and extensibility as needed for ROM, improved strength as needed to improve performance of [facility] activities/functional movements     SciFit seated elliptical; Level 4; seat at 10, 5 minutes - subjective information gathered during this time   Total Gym; Level 22; L single-leg minisquat; 1x10 and 1x5  Forward step up to 6-inch step, staircase in center of clinic ; 2x10 with LLE   In parallel bars: Hurdle steps, consecutive; mixed height hurdles, 3 6-inch and 2 12-inch hurdles; 5x D/B  3-way Hip with Red Tband at ankles;1x8 ea dir each LE  Tandem walk on long Airex pad; 4x D/B intermittent touch with LLE weakness      In hallway: Gait with side-to-side ball toss; 3x D/B 70-ft hallway, SBA c PT             -patient has one episode of significant LLE buckling and abruptly leans into wall and assumes mini-lunge position with PT rapidly holding onto pt around his trunk, brief rest break was allowed prior to resumption of walking with dual task    On treatment table:   Lower trunk rotations in hooklying; 2x10 alternating R/L Supine piriformis stretch, dynamic; 1 sec on, 1 sec off; x20 in figure-4 position for LLE Figure-4 push; x20, 1 sec with LLE       *not today* Ambulate laps in gym x 3 with SPC in R hand  LLE sciatic flossing; x20, 1 sec; in hooklying  Standing quad stretch; 2x30 sec, in standing, stance on R foot, R hand holding onto table  Sit to stand from standard chair; maintaining neutral spine; 2x10              Straight leg raise, supine in low mat; 2x10 Forward/backward stepping with therapist guarding patient posteriorly in case of  LLE buckling, x 2 D/B 70-ft course Adjacent to single parallel bar, seated on blue physioball, alternating shoulder and hip flexion (opposite sides) with upright posture; 2x10  alternating Bird dog; quadruped; 1x8 alternating Dynamic forward march with contralateral knee touch; 4x D/B length of bars Sidestep with minimal upper limb support, careful guarding with intermittent LLE buckling; x4 D/B 30-ft course Supine dead bug with alternating knee extension/shoulder flexion; 2x10 alternating 3 6-inch hurdles consecutive step-over; step-to pattern x 2 D/B, reciprocal pattern x2 D/B Consecutive forward step-up to Airex pad, 3 Airex pads in // bars; 4x D/B                 Piriformis stretch; 20x, 1 sec Active hamstring stretch (glider technique): 2x10 on each LE Gait with SPC following demo from PT; ambulate around gym x 1 lap with VC for cane/foot sequencing NuStep, seat at 10 and arms at 11; Level 3 resistance; 5 minutes  Dynamic march 3x D/B length of // bars Standing march with bilateral light touch on single parallel bar for complete weight shift to each lower limb; x10 alternating Seated hip abduction with Grn Tband, with abdominal brace and upright posture/neutral spine; 2x10 Abdominal brace with Bridge c neutral spine,  full range; 2x10 Abdominal brace with alternating march and shoulder flexion; 2x10 alternating R/L Standing toe tap onto 6-inch step; 2x10 alternating R/L for weight shifting and weight acceptance onto L lower limb Standing heel raise; 2x10 with bilat support on front-wheeled walker Abdominal brace/TrA contraction with alternating adductor ball squeeze/hip ABD iso against belt; 2x10, 5 sec  6-minute walk with front-wheel walker Abdominal brace/TrA contraction; x10, 5 sec Glute set; 2x10, 5 sec           HOME EXERCISE PROGRAM: Access Code [email or web address]       ASSESSMENT:   CLINICAL IMPRESSION: Pt is able to resume focus on active PT with focus on LE strengthening, stepping and obstacle negotiation drills, and gait with dual task. Patient has intermittent LLE buckling but pt is readily able to maintain his postural control and remain upright  during these episodes with brief pause in activity. He demonstrates good gait pattern and is able to perform drills on unstable surfaces and high-level obstacle negotiation with relative ease, but intermittent giving way of L knee does still limit patient's ability to safely negotiate home and community environment. Pt still has significant challenges related to persistent pain, LLE weakness, and hypersensitivity of L thigh. Pt will benefit from skilled physical therapy to address the noted deficits in order to increase safety with functional activities and allow pt to return to work and home duties.      OBJECTIVE IMPAIRMENTS Abnormal gait, decreased activity tolerance, decreased balance, decreased endurance, decreased mobility, difficulty walking, decreased ROM, decreased strength, decreased safety awareness, impaired flexibility, impaired sensation, postural dysfunction, and pain.    ACTIVITY LIMITATIONS carrying, lifting, bending, sitting, standing, squatting, stairs, and transfers   PARTICIPATION LIMITATIONS: cleaning, laundry, driving, shopping, community activity, occupation, and yard work   PERSONAL FACTORS Time since onset of injury/illness/exacerbation and 1-2 comorbidities: schizophrenia and anxiety  are also affecting patient's functional outcome.    REHAB POTENTIAL: Good   CLINICAL DECISION MAKING: Evolving/moderate complexity   EVALUATION COMPLEXITY: Moderate     GOALS: Goals reviewed with patient? Yes   SHORT TERM GOALS: Target date: 01/21/2022   Patient will be independent in home exercise program to improve strength/mobility for better functional independence with ADLs. Baseline:   02/11/22: Pt is compliant with his  HEP  Goal status: ACHIEVED   2.  Pt will ambulate 200+ feet using RW without LOB or LLE buckling to demonstrate progression towards improved ambulatory endurance.  Baseline: 157ft .    02/11/22: Pt able to ambulate > 200 feet without LLE buckling Goal status:  ACHIEVED   3.   Patient will deny any falls over past 4 weeks to demonstrate improved safety at home.  Baseline: 4 falls since surgery.   02/11/22: 2 falls in June.   03/04/22: No falls in previous month.  03/25/22: Pt last had fall in home 03/22/22 Goal status: PREVIOUSLY MET     LONG TERM GOALS: Target date: 03/04/2022   Patient will increase FOTO score to equal to or greater than 41 to demonstrate statistically significant improvement in mobility and quality of life.  Baseline: 01/07/22: 23/41.  7/11/2: 44/41 Goal status: ACHIEVED   2.   Patient (< 8 years old) will complete five times sit to stand test in < 10 seconds indicating an increased LE strength and improved balance. Baseline: 01/07/22: 43 seconds.   02/11/22: 36.1 sec.  03/04/22: 27.4 seconds.    03/18/22: 21.6 seconds.  Goal status: IN PROGRESS   3.  Patient will increase 10 meter walk test to >1.37m/s as to improve gait speed for better community ambulation and to reduce fall risk. Baseline: 01/07/22: 0.41 m/s.   02/11/22: 0.65 m/s.    03/04/22: 0.65 m/s fast speed   03/18/22: 0.90 m/s fast speed Goal status: IN PROGRESS   4.  Patient will increase six minute walk test distance to >1000 for progression to community ambulator and improve gait ability. Baseline: 01/14/22: 280 feet.  02/11/22: 600 feet.   03/04/22: 735 feet.   03/18/22: 840 feet.  Goal status: IN PROGRESS   5.  Patient will increase LLE gross strength to 4/5 as to improve functional strength for independent gait, increased standing tolerance and increased ADL ability. Baseline: 01/07/22: ranging 3-4/5, please see objective.   02/11/22: 4 to 4+/5 MMTs, see above.  Goal status: ACHIEVED         PLAN: PT FREQUENCY: 2x/week   PT DURATION: 4-6 weeks   PLANNED INTERVENTIONS: Therapeutic exercises, Therapeutic activity, Neuromuscular re-education, Balance training, Gait training, Patient/Family education, Joint mobilization, Stair training, DME instructions, Dry Needling, Cryotherapy,  Moist heat, and Manual therapy.   PLAN FOR NEXT SESSION: LE strengthening, trunk stabilization, LE flexibility. Progress with increasing emphasis on standing/weightbearing activity and continued AD wean as able from [facility] to no AD. Modalities and manual therapy for pain modulation as needed.     Valentina Gu, PT, DPT #[id number]  Eilleen Kempf, PT 03/27/2022, 11:29 AM

## 2022-04-01 ENCOUNTER — Encounter: Payer: Self-pay | Admitting: Physical Therapy

## 2022-04-01 ENCOUNTER — Ambulatory Visit: Payer: Medicare Other | Admitting: Physical Therapy

## 2022-04-01 DIAGNOSIS — R2681 Unsteadiness on feet: Secondary | ICD-10-CM

## 2022-04-01 DIAGNOSIS — M6281 Muscle weakness (generalized): Secondary | ICD-10-CM

## 2022-04-01 DIAGNOSIS — M545 Low back pain, unspecified: Secondary | ICD-10-CM

## 2022-04-01 DIAGNOSIS — Z981 Arthrodesis status: Secondary | ICD-10-CM

## 2022-04-01 DIAGNOSIS — R269 Unspecified abnormalities of gait and mobility: Secondary | ICD-10-CM

## 2022-04-01 NOTE — Therapy (Signed)
OUTPATIENT PHYSICAL THERAPY TREATMENT NOTE   Patient Name: Justin Nixon MRN: [id number] DOB:Feb 17, 1976, 46 y.o., male Today's Date: 04/02/2022   END OF SESSION:   PT End of Session - 04/01/22 1132     Visit Number 24    Number of Visits 28    Date for PT Re-Evaluation 04/24/22    Progress Note Due on Visit 28    PT Start Time 1125    PT Stop Time 1207    PT Time Calculation (min) 42 min    Equipment Utilized During Treatment Gait belt    Activity Tolerance Patient limited by fatigue;Patient limited by pain    Behavior During Therapy WFL for tasks assessed/performed             Past Medical History:  Diagnosis Date   Anxiety    Arthritis    GERD (gastroesophageal reflux disease)    H/O   Hyperlipidemia    Schizophrenia ([place])    Past Surgical History:  Procedure Laterality Date   ANTERIOR LATERAL LUMBAR FUSION WITH PERCUTANEOUS SCREW 1 LEVEL N/A 11/18/2021   Procedure: L4-5 LATERAL LUMBAR INTERBODY FUSION WITH POSTERIOR SPINAL FUSION;  Surgeon: Meade Maw, MD;  Location: [facility];  Service: Neurosurgery;  Laterality: N/A;   APPLICATION OF INTRAOPERATIVE CT SCAN N/A 11/18/2021   Procedure: APPLICATION OF INTRAOPERATIVE CT SCAN;  Surgeon: Meade Maw, MD;  Location: [facility];  Service: Neurosurgery;  Laterality: N/A;   COLONOSCOPY     CYST EXCISION     ELBOW ARTHROSCOPY Left 10/10/2020   Procedure: LEFT ELBOW ARTHROSCOPY WITH DEBRIDEMENT AND EXCISION OF LOOSE BODIES;  Surgeon: Corky Mull, MD;  Location: [facility];  Service: Orthopedics;  Laterality: Left;   MYRINGOTOMY WITH TUBE PLACEMENT     ORIF ELBOW FRACTURE Left 08/28/2021   Procedure: OPEN REDUCTION INTERNAL FIXATION (ORIF) ELBOW/OLECRANON FRACTURE;  Surgeon: Corky Mull, MD;  Location: [facility];  Service: Orthopedics;  Laterality: Left;   SHOULDER ARTHROSCOPY Left 03/18/2017   Procedure: ARTHROSCOPY SHOULDER with debridement decompression and distal clavicle excision;  Surgeon: Corky Mull, MD;   Location: [facility];  Service: Orthopedics;  Laterality: Left;   SHOULDER ARTHROSCOPY WITH OPEN ROTATOR CUFF REPAIR Right 09/23/2016   Procedure: SHOULDER ARTHROSCOPY WITH OPEN ROTATOR CUFF REPAIR;  Surgeon: Corky Mull, MD;  Location: [facility];  Service: Orthopedics;  Laterality: Right;   SHOULDER ARTHROSCOPY WITH SUBACROMIAL DECOMPRESSION Right 09/23/2016   Procedure: SHOULDER ARTHROSCOPY WITH SUBACROMIAL DECOMPRESSION AND DEBRIDEMENT;  Surgeon: Corky Mull, MD;  Location: [facility];  Service: Orthopedics;  Laterality: Right;   Patient Active Problem List   Diagnosis Date Noted   S/P spinal fusion 11/18/2021   Olecranon fracture, left, open type I or II, initial encounter 08/28/2021      PCP: Baxter Hire MD REFERRING PROVIDER: Meade Maw, MD     REFERRING DIAG: Z98.1 (ICD-10-CM) - History of lumbar fusion   THERAPY DIAG:  Muscle weakness (generalized)   S/P lumbar fusion   Abnormality of gait and mobility   Unsteadiness on feet   Rationale for Evaluation and Treatment Rehabilitation   PERTINENT HISTORY: Pt presents to physical therapy for evaluation of low back and LE pain and weakness L>R, following L4-L5 XLIF/PSF on 11/18/21. Pt states he was using a cane before surgery due to onset of LLE weakness. States he has now been using a RW since surgery. Pain ranges from a 5/10 to a 10/10. When aggravated pain is described as sharp and shooting into  LLE; at baseline, pain is achy. Wearing clothes such as jeans is painful due to sensitivity of skin; at home wears pajamas. Pt has not returned to driving; he is limited to household distance ambulation or maximum into a doctors office. He feels as though his LLE is going to "give out" when he is walking or weightbearing. 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 Reports frequent falling since surgery due to LLE  giving out; no injuries associated with falls.    PRECAUTIONS: None at this time following 03/13/22 visit with Dr. Izora Ribas     OBJECTIVE: (objective measures completed at initial evaluation unless otherwise dated)   DIAGNOSTIC FINDINGS:  "Intraoperative images during L4-L5 anterior and posterior fusion. No evidence of immediate complication"   PATIENT SURVEYS:  Modified Oswestry (updated 01/09/22 due to incomplete form): 62%.   02/11/22: 48%.  03/04/22: 28% FOTO: eval 23/41,   02/11/22: 44/41   SCREENING FOR RED FLAGS: Bowel or bladder incontinence: No Spinal tumors: No Cauda equina syndrome: No Compression fracture: No Abdominal aneurysm: No   COGNITION:           Overall cognitive status: Within functional limits for tasks assessed                          SENSATION: Light touch: Impaired  - increased sensitivity in left L2 and L3 dermatomes    MUSCLE LENGTH: Hamstrings: unable to test due to increase in sharp/shooting pain in hip flexors prior to HS stretch being felt.  (01/28/22): R lacking 30 deg, L lacking 40 deg.   Thomas test: NT   POSTURE: decreased lumbar lordosis and posterior pelvic tilt   PALPATION: Right: hip flexors and lateral hip, glute med, piriformis, deep external rotators, QL Left: hip flexors, lateral hip and adductors (from proximal hip to mid-thigh), proximal hamstrings, L4-distal paraspinals, QL, all gluteals   OBSERVATION 01/21/22: No bruising, ecchymosis, increased tissue temperature, or erythema observed along L flank, L gluteal region, or adjacent to surgical incisions. No sign of infection or delayed healing   LUMBAR ROM: not assessed due to back precautions    Active  A/PROM  eval  Flexion -  Extension -  Right lateral flexion -  Left lateral flexion -  Right rotation -  Left rotation -   (Blank rows = not tested)   LOWER EXTREMITY ROM:      Passive  Right eval Left eval  Hip flexion 100 90  Hip extension NT NT  Hip abduction 20 20   Hip adduction      Hip internal rotation      Hip external rotation      Knee flexion [facility]  Knee extension [facility]  Ankle dorsiflexion [facility]  Ankle plantarflexion      Ankle inversion      Ankle eversion       (Blank rows = not tested)   LOWER EXTREMITY MMT:     MMT Right eval Left eval Right 02/11/22 Left 02/11/22  Hip flexion 4-* 4-* 4+ 4*  Hip extension          Hip abduction 4+* 4-* 4+ 4+  Hip adduction 4* 4* 4+ 4+  Hip internal rotation 5 3* 5 4*  Hip external rotation 5 4* 5 4*  Knee flexion 5 4* 5 4+  Knee extension 5 4+ 5 4  Ankle dorsiflexion  4+ 3+ 5 4  Ankle plantarflexion           (Blank rows = not tested)     FUNCTIONAL TESTS:  5 times sit to stand: 43.37 seconds using arms.    02/11/22: 36.1 seconds.  03/04/22: 27.4 seconds.  6 minute walk test (performed 01/14/22): 280 feet.     02/11/22: 600 feet.  03/04/22: 735 feet. 10 meter walk test: 24.3 seconds (comfortable) = 0.41 m/s using RW; terminated fast-pace for safety.   02/11/22: 0.64 m/s comfortable speed, 0.65 m/s fast speed.  03/04/22: 0.65 m/s fast speed   STS mechanics: no UE support, use of momentum with rapid hip hinge to initiate sit to stand. Intermittent LLE buckling and varus/valgus immediately following initiation of sit to stand             -03/04/22: After 2 attempts pt able to perform sit to stand with no UE support, upward velocity of transfers slows down halfway through transfer with intermittent LLE varus/valgus   GAIT: Distance walked: 735 feet Assistive device utilized: SPC Level of assistance: standby assist Comments: good heel to toe pattern, intermittent LLE buckling requiring pt holding wall and intermittently placing hands on his L thigh for support; no assist required from PT to prevent fall         TODAY'S TREATMENT    SUBJECTIVE: Patient reports some stinging pain in L side of low back in AM over the weekend and yesterday. He reports tolerating recent PT visits well. Pt reports  notable challenge with single-leg loading on L side. He denies pain during warm-up at arrival.      PAIN:  Are you having pain? No Pain location: Pain in L thigh and knee, L hip       Therapeutic Exercise - for improved soft tissue flexibility and extensibility as needed for ROM, improved strength as needed to improve performance of CKC activities/functional movements and prevention of fall during episode of LOB, progressive standing/weightbearing activity to improve capacity for community-level mobility with decreasing reliance on AD/external support     SciFit seated elliptical; Level 4; seat at 10, 5 minutes - subjective information gathered during this time   Total Gym; Level 22; L single-leg minisquat; 1x6 and 1x8       In parallel bars: Hurdle steps, consecutive; mixed height hurdles, 3 6-inch and 2 12-inch hurdles; 5x D/B  3-way Hip with Red Tband at ankles;1x10 ea dir each LE  Tandem walk on long Airex pad; 4x D/B      In hallway: Gait with side-to-side ball toss; forward-retro step and sidestepping; 2x D/B each SBA c PT  -no significant LOB or LE buckling today   Sit to stand from standard chair; with Goblet hold c 6-lb Medball; 2x10     On treatment table:   Lower trunk rotations in hooklying; 2x10 alternating R/L Supine piriformis stretch, dynamic; 1 sec on, 1 sec off; x20 in figure-4 position for LLE Figure-4 push; x20, 1 sec with LLE        *not today* Forward step up to 6-inch step, staircase in center of clinic ; 2x10 with LLE Ambulate laps in gym x 3 with SPC in R hand  LLE sciatic flossing; x20, 1 sec; in hooklying  Standing quad stretch; 2x30 sec, in standing, stance on R foot, R hand holding onto table               Straight leg raise, supine in low mat; 2x10 Forward/backward stepping with  therapist guarding patient posteriorly in case of LLE buckling, x 2 D/B 70-ft course Adjacent to single parallel bar, seated on blue physioball, alternating shoulder  and hip flexion (opposite sides) with upright posture; 2x10 alternating Bird dog; quadruped; 1x8 alternating Dynamic forward march with contralateral knee touch; 4x D/B length of bars Sidestep with minimal upper limb support, careful guarding with intermittent LLE buckling; x4 D/B 30-ft course Supine dead bug with alternating knee extension/shoulder flexion; 2x10 alternating 3 6-inch hurdles consecutive step-over; step-to pattern x 2 D/B, reciprocal pattern x2 D/B Consecutive forward step-up to Airex pad, 3 Airex pads in // bars; 4x D/B                 Piriformis stretch; 20x, 1 sec Active hamstring stretch (glider technique): 2x10 on each LE Gait with SPC following demo from PT; ambulate around gym x 1 lap with VC for cane/foot sequencing NuStep, seat at 10 and arms at 11; Level 3 resistance; 5 minutes  Dynamic march 3x D/B length of // bars Standing march with bilateral light touch on single parallel bar for complete weight shift to each lower limb; x10 alternating Seated hip abduction with Grn Tband, with abdominal brace and upright posture/neutral spine; 2x10 Abdominal brace with Bridge c neutral spine,  full range; 2x10 Abdominal brace with alternating march and shoulder flexion; 2x10 alternating R/L Standing toe tap onto 6-inch step; 2x10 alternating R/L for weight shifting and weight acceptance onto L lower limb Standing heel raise; 2x10 with bilat support on front-wheeled walker Abdominal brace/TrA contraction with alternating adductor ball squeeze/hip ABD iso against belt; 2x10, 5 sec  6-minute walk with front-wheel walker Abdominal brace/TrA contraction; x10, 5 sec Glute set; 2x10, 5 sec           HOME EXERCISE PROGRAM: Access Code [email or web address]       ASSESSMENT:   CLINICAL IMPRESSION: Pt demonstrates good gait pattern and significantly less occurrence of LLE giving way during weightbearing activity. Patient has minimal pain this AM, but he does have intermittent L lumbar  flank pain and iliolumbar pain intermittently affecting either side. Pt demonstrates substantial improvement in sit to stand performance with pt performing sit to stand from 20-inch chair with no UE support and Medball Goblet hold with minimal challenge today. Pt still has significant challenges related to persistent lower quarter pain, LLE weakness, and hypersensitivity of L thigh. Pt will benefit from skilled physical therapy to address the noted deficits in strength, weakness, and community-level mobility in order to increase safety with functional activities and allow pt to return to work and home duties.      OBJECTIVE IMPAIRMENTS Abnormal gait, decreased activity tolerance, decreased balance, decreased endurance, decreased mobility, difficulty walking, decreased ROM, decreased strength, decreased safety awareness, impaired flexibility, impaired sensation, postural dysfunction, and pain.    ACTIVITY LIMITATIONS carrying, lifting, bending, sitting, standing, squatting, stairs, and transfers   PARTICIPATION LIMITATIONS: cleaning, laundry, driving, shopping, community activity, occupation, and yard work   PERSONAL FACTORS Time since onset of injury/illness/exacerbation and 1-2 comorbidities: schizophrenia and anxiety  are also affecting patient's functional outcome.    REHAB POTENTIAL: Good   CLINICAL DECISION MAKING: Evolving/moderate complexity   EVALUATION COMPLEXITY: Moderate     GOALS: Goals reviewed with patient? Yes   SHORT TERM GOALS: Target date: 01/21/2022   Patient will be independent in home exercise program to improve strength/mobility for better functional independence with ADLs. Baseline:   02/11/22: Pt is compliant with his HEP  Goal status: ACHIEVED  2.  Pt will ambulate 200+ feet using RW without LOB or LLE buckling to demonstrate progression towards improved ambulatory endurance.  Baseline: 142ft .    02/11/22: Pt able to ambulate > 200 feet without LLE buckling Goal  status: ACHIEVED   3.   Patient will deny any falls over past 4 weeks to demonstrate improved safety at home.  Baseline: 4 falls since surgery.   02/11/22: 2 falls in June.   03/04/22: No falls in previous month.  03/25/22: Pt last had fall in home 03/22/22 Goal status: PREVIOUSLY MET     LONG TERM GOALS: Target date: 03/04/2022   Patient will increase FOTO score to equal to or greater than 41 to demonstrate statistically significant improvement in mobility and quality of life.  Baseline: 01/07/22: 23/41.  7/11/2: 44/41 Goal status: ACHIEVED   2.   Patient (< 46 years old) will complete five times sit to stand test in < 10 seconds indicating an increased LE strength and improved balance. Baseline: 01/07/22: 43 seconds.   02/11/22: 36.1 sec.  03/04/22: 27.4 seconds.    03/18/22: 21.6 seconds.  Goal status: IN PROGRESS   3.  Patient will increase 10 meter walk test to >1.35m/s as to improve gait speed for better community ambulation and to reduce fall risk. Baseline: 01/07/22: 0.41 m/s.   02/11/22: 0.65 m/s.    03/04/22: 0.65 m/s fast speed   03/18/22: 0.90 m/s fast speed Goal status: IN PROGRESS   4.  Patient will increase six minute walk test distance to >1000 for progression to community ambulator and improve gait ability. Baseline: 01/14/22: 280 feet.  02/11/22: 600 feet.   03/04/22: 735 feet.   03/18/22: 840 feet.  Goal status: IN PROGRESS   5.  Patient will increase LLE gross strength to 4/5 as to improve functional strength for independent gait, increased standing tolerance and increased ADL ability. Baseline: 01/07/22: ranging 3-4/5, please see objective.   02/11/22: 4 to 4+/5 MMTs, see above.  Goal status: ACHIEVED         PLAN: PT FREQUENCY: 2x/week   PT DURATION: 4-6 weeks   PLANNED INTERVENTIONS: Therapeutic exercises, Therapeutic activity, Neuromuscular re-education, Balance training, Gait training, Patient/Family education, Joint mobilization, Stair training, DME instructions, Dry Needling,  Cryotherapy, Moist heat, and Manual therapy.   PLAN FOR NEXT SESSION: LE strengthening, trunk stabilization, LE flexibility. Progress with increasing emphasis on standing/weightbearing activity and continued AD wean as able from [facility] to no AD. Modalities and manual therapy for pain modulation as needed.        Valentina Gu, PT, DPT #[id number]  Eilleen Kempf, PT 04/02/2022, 8:29 AM

## 2022-04-03 ENCOUNTER — Ambulatory Visit: Payer: Medicare Other | Admitting: Physical Therapy

## 2022-04-03 ENCOUNTER — Encounter: Payer: Self-pay | Admitting: Physical Therapy

## 2022-04-03 DIAGNOSIS — Z981 Arthrodesis status: Secondary | ICD-10-CM

## 2022-04-03 DIAGNOSIS — M545 Low back pain, unspecified: Secondary | ICD-10-CM

## 2022-04-03 DIAGNOSIS — R2681 Unsteadiness on feet: Secondary | ICD-10-CM

## 2022-04-03 DIAGNOSIS — R269 Unspecified abnormalities of gait and mobility: Secondary | ICD-10-CM

## 2022-04-03 DIAGNOSIS — M6281 Muscle weakness (generalized): Secondary | ICD-10-CM | POA: Diagnosis not present

## 2022-04-03 NOTE — Therapy (Signed)
OUTPATIENT PHYSICAL THERAPY TREATMENT NOTE   Patient Name: Justin Nixon MRN: [id number] DOB:1976-01-13, 46 y.o., male Today's Date: 04/03/2022   END OF SESSION:   PT End of Session - 04/03/22 1123     Visit Number 25    Number of Visits 28    Date for PT Re-Evaluation 04/24/22    Progress Note Due on Visit 28    PT Start Time 1119    PT Stop Time 1202    PT Time Calculation (min) 43 min    Equipment Utilized During Treatment Gait belt    Activity Tolerance Patient limited by fatigue;Patient limited by pain    Behavior During Therapy WFL for tasks assessed/performed             Past Medical History:  Diagnosis Date   Anxiety    Arthritis    GERD (gastroesophageal reflux disease)    H/O   Hyperlipidemia    Schizophrenia ([place])    Past Surgical History:  Procedure Laterality Date   ANTERIOR LATERAL LUMBAR FUSION WITH PERCUTANEOUS SCREW 1 LEVEL N/A 11/18/2021   Procedure: L4-5 LATERAL LUMBAR INTERBODY FUSION WITH POSTERIOR SPINAL FUSION;  Surgeon: Meade Maw, MD;  Location: [facility];  Service: Neurosurgery;  Laterality: N/A;   APPLICATION OF INTRAOPERATIVE CT SCAN N/A 11/18/2021   Procedure: APPLICATION OF INTRAOPERATIVE CT SCAN;  Surgeon: Meade Maw, MD;  Location: [facility];  Service: Neurosurgery;  Laterality: N/A;   COLONOSCOPY     CYST EXCISION     ELBOW ARTHROSCOPY Left 10/10/2020   Procedure: LEFT ELBOW ARTHROSCOPY WITH DEBRIDEMENT AND EXCISION OF LOOSE BODIES;  Surgeon: Corky Mull, MD;  Location: [facility];  Service: Orthopedics;  Laterality: Left;   MYRINGOTOMY WITH TUBE PLACEMENT     ORIF ELBOW FRACTURE Left 08/28/2021   Procedure: OPEN REDUCTION INTERNAL FIXATION (ORIF) ELBOW/OLECRANON FRACTURE;  Surgeon: Corky Mull, MD;  Location: [facility];  Service: Orthopedics;  Laterality: Left;   SHOULDER ARTHROSCOPY Left 03/18/2017   Procedure: ARTHROSCOPY SHOULDER with debridement decompression and distal clavicle excision;  Surgeon: Corky Mull, MD;   Location: [place];  Service: Orthopedics;  Laterality: Left;   SHOULDER ARTHROSCOPY WITH OPEN ROTATOR CUFF REPAIR Right 09/23/2016   Procedure: SHOULDER ARTHROSCOPY WITH OPEN ROTATOR CUFF REPAIR;  Surgeon: Corky Mull, MD;  Location: [facility];  Service: Orthopedics;  Laterality: Right;   SHOULDER ARTHROSCOPY WITH SUBACROMIAL DECOMPRESSION Right 09/23/2016   Procedure: SHOULDER ARTHROSCOPY WITH SUBACROMIAL DECOMPRESSION AND DEBRIDEMENT;  Surgeon: Corky Mull, MD;  Location: [facility];  Service: Orthopedics;  Laterality: Right;   Patient Active Problem List   Diagnosis Date Noted   S/P spinal fusion 11/18/2021   Olecranon fracture, left, open type I or II, initial encounter 08/28/2021      PCP: Baxter Hire MD REFERRING PROVIDER: Meade Maw, MD     REFERRING DIAG: Z98.1 (ICD-10-CM) - History of lumbar fusion   THERAPY DIAG:  Muscle weakness (generalized)   S/P lumbar fusion   Abnormality of gait and mobility   Unsteadiness on feet   Rationale for Evaluation and Treatment Rehabilitation   PERTINENT HISTORY: Pt presents to physical therapy for evaluation of low back and LE pain and weakness L>R, following L4-L5 XLIF/PSF on 11/18/21. Pt states he was using a cane before surgery due to onset of LLE weakness. States he has now been using a RW since surgery. Pain ranges from a 5/10 to a 10/10. When aggravated pain is described as sharp and shooting into  LLE; at baseline, pain is achy. Wearing clothes such as jeans is painful due to sensitivity of skin; at home wears pajamas. Pt has not returned to driving; he is limited to household distance ambulation or maximum into a doctors office. He feels as though his LLE is going to "give out" when he is walking or weightbearing. 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 Reports frequent falling since surgery due to LLE  giving out; no injuries associated with falls.    PRECAUTIONS: None at this time following 03/13/22 visit with Dr. Izora Ribas     OBJECTIVE: (objective measures completed at initial evaluation unless otherwise dated)   DIAGNOSTIC FINDINGS:  "Intraoperative images during L4-L5 anterior and posterior fusion. No evidence of immediate complication"   PATIENT SURVEYS:  Modified Oswestry (updated 01/09/22 due to incomplete form): 62%.   02/11/22: 48%.  03/04/22: 28% FOTO: eval 23/41,   02/11/22: 44/41   SCREENING FOR RED FLAGS: Bowel or bladder incontinence: No Spinal tumors: No Cauda equina syndrome: No Compression fracture: No Abdominal aneurysm: No   COGNITION:           Overall cognitive status: Within functional limits for tasks assessed                          SENSATION: Light touch: Impaired  - increased sensitivity in left L2 and L3 dermatomes    MUSCLE LENGTH: Hamstrings: unable to test due to increase in sharp/shooting pain in hip flexors prior to HS stretch being felt.  (01/28/22): R lacking 30 deg, L lacking 40 deg.   Thomas test: NT   POSTURE: decreased lumbar lordosis and posterior pelvic tilt   PALPATION: Right: hip flexors and lateral hip, glute med, piriformis, deep external rotators, QL Left: hip flexors, lateral hip and adductors (from proximal hip to mid-thigh), proximal hamstrings, L4-distal paraspinals, QL, all gluteals   OBSERVATION 01/21/22: No bruising, ecchymosis, increased tissue temperature, or erythema observed along L flank, L gluteal region, or adjacent to surgical incisions. No sign of infection or delayed healing   LUMBAR ROM: not assessed due to back precautions    Active  A/PROM  eval  Flexion -  Extension -  Right lateral flexion -  Left lateral flexion -  Right rotation -  Left rotation -   (Blank rows = not tested)   LOWER EXTREMITY ROM:      Passive  Right eval Left eval  Hip flexion 100 90  Hip extension NT NT  Hip abduction 20 20   Hip adduction      Hip internal rotation      Hip external rotation      Knee flexion [facility]  Knee extension [facility]  Ankle dorsiflexion [facility]  Ankle plantarflexion      Ankle inversion      Ankle eversion       (Blank rows = not tested)   LOWER EXTREMITY MMT:     MMT Right eval Left eval Right 02/11/22 Left 02/11/22  Hip flexion 4-* 4-* 4+ 4*  Hip extension          Hip abduction 4+* 4-* 4+ 4+  Hip adduction 4* 4* 4+ 4+  Hip internal rotation 5 3* 5 4*  Hip external rotation 5 4* 5 4*  Knee flexion 5 4* 5 4+  Knee extension 5 4+ 5 4  Ankle dorsiflexion  4+ 3+ 5 4  Ankle plantarflexion           (Blank rows = not tested)     FUNCTIONAL TESTS:  5 times sit to stand: 43.37 seconds using arms.    02/11/22: 36.1 seconds.  03/04/22: 27.4 seconds.  6 minute walk test (performed 01/14/22): 280 feet.     02/11/22: 600 feet.  03/04/22: 735 feet. 10 meter walk test: 24.3 seconds (comfortable) = 0.41 m/s using RW; terminated fast-pace for safety.   02/11/22: 0.64 m/s comfortable speed, 0.65 m/s fast speed.  03/04/22: 0.65 m/s fast speed   STS mechanics: no UE support, use of momentum with rapid hip hinge to initiate sit to stand. Intermittent LLE buckling and varus/valgus immediately following initiation of sit to stand             -03/04/22: After 2 attempts pt able to perform sit to stand with no UE support, upward velocity of transfers slows down halfway through transfer with intermittent LLE varus/valgus   GAIT: Distance walked: 735 feet Assistive device utilized: SPC Level of assistance: standby assist Comments: good heel to toe pattern, intermittent LLE buckling requiring pt holding wall and intermittently placing hands on his L thigh for support; no assist required from PT to prevent fall         TODAY'S TREATMENT    SUBJECTIVE: Patient reports mild pain affecting L flank and L iliolumbar region at arrival this AM. He reports tolerating last visit well and he feels he is making  progress with gait and his LLE strength.      PAIN:  Are you having pain? 2/10 pain in L flank of lumbar spine at arrival Pain location: Pain in L thigh and knee, L hip       Therapeutic Exercise - for improved soft tissue flexibility and extensibility as needed for ROM, improved strength as needed to improve performance of CKC activities/functional movements and prevention of fall during episode of LOB, progressive standing/weightbearing activity to improve capacity for community-level mobility with decreasing reliance on AD/external support     SciFit seated elliptical; Level 4; seat at 10, 5 minutes - subjective information gathered during this time   Total Gym; Level 22; L single-leg minisquat; 1x6 and 1x8        Adjacent to treadmill arm support: 3-way Hip with Green Tband at knees;1x10 ea dir each LE  Tandem walk on long Airex pad; 4x D/B      In hallway: Gait with side-to-side ball toss; forward-retro step and sidestepping; 2x D/B each SBA c PT             -no significant LOB or LE buckling today     Sit to stand from standard chair; with Goblet hold c 6-lb Medball; 2x10      On treatment table:   Lower trunk rotations in hooklying; 2x10 alternating R/L Supine piriformis stretch, dynamic; 1 sec on, 1 sec off; x20 in figure-4 position for LLE Figure-4 push; x20, 1 sec with LLE   Standing quadriceps stretch; 1x60sec      *not today* Hurdle steps, consecutive; mixed height hurdles, 3 6-inch and 2 12-inch hurdles; 5x D/B  Forward step up to 6-inch step, staircase in center of clinic ; 2x10 with LLE Ambulate laps in gym x 3 with SPC in R hand  LLE sciatic flossing; x20, 1 sec; in hooklying  Standing quad stretch; 2x30 sec, in standing, stance on R foot, R hand holding onto table  Straight leg raise, supine in low mat; 2x10 Forward/backward stepping with therapist guarding patient posteriorly in case of LLE buckling, x 2 D/B 70-ft course Adjacent to single  parallel bar, seated on blue physioball, alternating shoulder and hip flexion (opposite sides) with upright posture; 2x10 alternating Bird dog; quadruped; 1x8 alternating Dynamic forward march with contralateral knee touch; 4x D/B length of bars Sidestep with minimal upper limb support, careful guarding with intermittent LLE buckling; x4 D/B 30-ft course Supine dead bug with alternating knee extension/shoulder flexion; 2x10 alternating 3 6-inch hurdles consecutive step-over; step-to pattern x 2 D/B, reciprocal pattern x2 D/B Consecutive forward step-up to Airex pad, 3 Airex pads in // bars; 4x D/B                 Piriformis stretch; 20x, 1 sec Active hamstring stretch (glider technique): 2x10 on each LE Gait with SPC following demo from PT; ambulate around gym x 1 lap with VC for cane/foot sequencing NuStep, seat at 10 and arms at 11; Level 3 resistance; 5 minutes  Dynamic march 3x D/B length of // bars Standing march with bilateral light touch on single parallel bar for complete weight shift to each lower limb; x10 alternating Seated hip abduction with Grn Tband, with abdominal brace and upright posture/neutral spine; 2x10 Abdominal brace with Bridge c neutral spine,  full range; 2x10 Abdominal brace with alternating march and shoulder flexion; 2x10 alternating R/L Standing toe tap onto 6-inch step; 2x10 alternating R/L for weight shifting and weight acceptance onto L lower limb Standing heel raise; 2x10 with bilat support on front-wheeled walker Abdominal brace/TrA contraction with alternating adductor ball squeeze/hip ABD iso against belt; 2x10, 5 sec  6-minute walk with front-wheel walker Abdominal brace/TrA contraction; x10, 5 sec Glute set; 2x10, 5 sec           HOME EXERCISE PROGRAM: Access Code [email or web address]       ASSESSMENT:   CLINICAL IMPRESSION: Pt demonstrates good gait pattern and significantly less occurrence of LLE giving way during weightbearing activity. Patient has  minimal pain this AM, but he does have intermittent L lumbar flank pain and iliolumbar pain intermittently affecting either side. Pt demonstrates substantial improvement in sit to stand performance with pt performing sit to stand from 20-inch chair with no UE support and Medball Goblet hold with minimal challenge today. Pt still has significant challenges related to persistent lower quarter pain, LLE weakness, and hypersensitivity of L thigh. Pt will benefit from skilled physical therapy to address the noted deficits in strength, weakness, and community-level mobility in order to increase safety with functional activities and allow pt to return to work and home duties.      OBJECTIVE IMPAIRMENTS Abnormal gait, decreased activity tolerance, decreased balance, decreased endurance, decreased mobility, difficulty walking, decreased ROM, decreased strength, decreased safety awareness, impaired flexibility, impaired sensation, postural dysfunction, and pain.    ACTIVITY LIMITATIONS carrying, lifting, bending, sitting, standing, squatting, stairs, and transfers   PARTICIPATION LIMITATIONS: cleaning, laundry, driving, shopping, community activity, occupation, and yard work   PERSONAL FACTORS Time since onset of injury/illness/exacerbation and 1-2 comorbidities: schizophrenia and anxiety  are also affecting patient's functional outcome.    REHAB POTENTIAL: Good   CLINICAL DECISION MAKING: Evolving/moderate complexity   EVALUATION COMPLEXITY: Moderate     GOALS: Goals reviewed with patient? Yes   SHORT TERM GOALS: Target date: 01/21/2022   Patient will be independent in home exercise program to improve strength/mobility for better functional independence with ADLs. Baseline:   02/11/22: Pt  is compliant with his HEP  Goal status: ACHIEVED   2.  Pt will ambulate 200+ feet using RW without LOB or LLE buckling to demonstrate progression towards improved ambulatory endurance.  Baseline: 165f .     02/11/22: Pt able to ambulate > 200 feet without LLE buckling Goal status: ACHIEVED   3.   Patient will deny any falls over past 4 weeks to demonstrate improved safety at home.  Baseline: 4 falls since surgery.   02/11/22: 2 falls in June.   03/04/22: No falls in previous month.  03/25/22: Pt last had fall in home 03/22/22 Goal status: PREVIOUSLY MET     LONG TERM GOALS: Target date: 03/04/2022   Patient will increase FOTO score to equal to or greater than 41 to demonstrate statistically significant improvement in mobility and quality of life.  Baseline: 01/07/22: 23/41.  7/11/2: 44/41 Goal status: ACHIEVED   2.   Patient (< 635years old) will complete five times sit to stand test in < 10 seconds indicating an increased LE strength and improved balance. Baseline: 01/07/22: 43 seconds.   02/11/22: 36.1 sec.  03/04/22: 27.4 seconds.    03/18/22: 21.6 seconds.  Goal status: IN PROGRESS   3.  Patient will increase 10 meter walk test to >1.033m as to improve gait speed for better community ambulation and to reduce fall risk. Baseline: 01/07/22: 0.41 m/s.   02/11/22: 0.65 m/s.    03/04/22: 0.65 m/s fast speed   03/18/22: 0.90 m/s fast speed Goal status: IN PROGRESS   4.  Patient will increase six minute walk test distance to >1000 for progression to community ambulator and improve gait ability. Baseline: 01/14/22: 280 feet.  02/11/22: 600 feet.   03/04/22: 735 feet.   03/18/22: 840 feet.  Goal status: IN PROGRESS   5.  Patient will increase LLE gross strength to 4/5 as to improve functional strength for independent gait, increased standing tolerance and increased ADL ability. Baseline: 01/07/22: ranging 3-4/5, please see objective.   02/11/22: 4 to 4+/5 MMTs, see above.  Goal status: ACHIEVED         PLAN: PT FREQUENCY: 2x/week   PT DURATION: 4-6 weeks   PLANNED INTERVENTIONS: Therapeutic exercises, Therapeutic activity, Neuromuscular re-education, Balance training, Gait training, Patient/Family education, Joint  mobilization, Stair training, DME instructions, Dry Needling, Cryotherapy, Moist heat, and Manual therapy.   PLAN FOR NEXT SESSION: LE strengthening, trunk stabilization, LE flexibility. Progress with increasing emphasis on standing/weightbearing activity and continued AD [facility]. Modalities and manual therapy for pain modulation as needed.       THIS NOTE IS INCOMPLETE, PLEASE DO NOT REFERENCE FOR INFORMATION  JeValentina GuPT, DPT #[id number] KempfPT 04/03/2022, 11:23 AM

## 2022-04-08 ENCOUNTER — Ambulatory Visit: Payer: Medicare Other | Attending: Neurosurgery | Admitting: Physical Therapy

## 2022-04-08 DIAGNOSIS — M545 Low back pain, unspecified: Secondary | ICD-10-CM | POA: Diagnosis present

## 2022-04-08 DIAGNOSIS — R269 Unspecified abnormalities of gait and mobility: Secondary | ICD-10-CM | POA: Diagnosis present

## 2022-04-08 DIAGNOSIS — R2681 Unsteadiness on feet: Secondary | ICD-10-CM | POA: Insufficient documentation

## 2022-04-08 DIAGNOSIS — M6281 Muscle weakness (generalized): Secondary | ICD-10-CM | POA: Insufficient documentation

## 2022-04-08 DIAGNOSIS — Z981 Arthrodesis status: Secondary | ICD-10-CM | POA: Insufficient documentation

## 2022-04-08 NOTE — Therapy (Unsigned)
OUTPATIENT PHYSICAL THERAPY TREATMENT NOTE   Patient Name: Justin Nixon MRN: [id number] DOB:07/07/1976, 46 y.o., male Today's Date: 04/08/2022  PCP: Marland Kitchen REFERRING PROVIDER: ***  END OF SESSION:   PT End of Session - 04/08/22 1124     Visit Number 26    Number of Visits 28    Date for PT Re-Evaluation 04/24/22    Progress Note Due on Visit 28    PT Start Time 1118    PT Stop Time 1201    PT Time Calculation (min) 43 min    Equipment Utilized During Treatment Gait belt    Activity Tolerance Patient limited by fatigue;Patient limited by pain    Behavior During Therapy WFL for tasks assessed/performed             Past Medical History:  Diagnosis Date   Anxiety    Arthritis    GERD (gastroesophageal reflux disease)    H/O   Hyperlipidemia    Schizophrenia ([place])    Past Surgical History:  Procedure Laterality Date   ANTERIOR LATERAL LUMBAR FUSION WITH PERCUTANEOUS SCREW 1 LEVEL N/A 11/18/2021   Procedure: L4-5 LATERAL LUMBAR INTERBODY FUSION WITH POSTERIOR SPINAL FUSION;  Surgeon: Meade Maw, MD;  Location: [facility];  Service: Neurosurgery;  Laterality: N/A;   APPLICATION OF INTRAOPERATIVE CT SCAN N/A 11/18/2021   Procedure: APPLICATION OF INTRAOPERATIVE CT SCAN;  Surgeon: Meade Maw, MD;  Location: [facility];  Service: Neurosurgery;  Laterality: N/A;   COLONOSCOPY     CYST EXCISION     ELBOW ARTHROSCOPY Left 10/10/2020   Procedure: LEFT ELBOW ARTHROSCOPY WITH DEBRIDEMENT AND EXCISION OF LOOSE BODIES;  Surgeon: Corky Mull, MD;  Location: [facility];  Service: Orthopedics;  Laterality: Left;   MYRINGOTOMY WITH TUBE PLACEMENT     ORIF ELBOW FRACTURE Left 08/28/2021   Procedure: OPEN REDUCTION INTERNAL FIXATION (ORIF) ELBOW/OLECRANON FRACTURE;  Surgeon: Corky Mull, MD;  Location: [facility];  Service: Orthopedics;  Laterality: Left;   SHOULDER ARTHROSCOPY Left 03/18/2017   Procedure: ARTHROSCOPY SHOULDER with debridement decompression and distal clavicle  excision;  Surgeon: Corky Mull, MD;  Location: [facility];  Service: Orthopedics;  Laterality: Left;   SHOULDER ARTHROSCOPY WITH OPEN ROTATOR CUFF REPAIR Right 09/23/2016   Procedure: SHOULDER ARTHROSCOPY WITH OPEN ROTATOR CUFF REPAIR;  Surgeon: Corky Mull, MD;  Location: [facility];  Service: Orthopedics;  Laterality: Right;   SHOULDER ARTHROSCOPY WITH SUBACROMIAL DECOMPRESSION Right 09/23/2016   Procedure: SHOULDER ARTHROSCOPY WITH SUBACROMIAL DECOMPRESSION AND DEBRIDEMENT;  Surgeon: Corky Mull, MD;  Location: [facility];  Service: Orthopedics;  Laterality: Right;   Patient Active Problem List   Diagnosis Date Noted   S/P spinal fusion 11/18/2021   Olecranon fracture, left, open type I or II, initial encounter 08/28/2021      PCP: Baxter Hire MD REFERRING PROVIDER: Meade Maw, MD     REFERRING DIAG: Z98.1 (ICD-10-CM) - History of lumbar fusion   THERAPY DIAG:  Muscle weakness (generalized)   S/P lumbar fusion   Abnormality of gait and mobility   Unsteadiness on feet   Rationale for Evaluation and Treatment Rehabilitation   PERTINENT HISTORY: Pt presents to physical therapy for evaluation of low back and LE pain and weakness L>R, following L4-L5 XLIF/PSF on 11/18/21. Pt states he was using a cane before surgery due to onset of LLE weakness. States he has now been using a RW since surgery. Pain ranges from a 5/10 to a 10/10. When aggravated pain is described  as sharp and shooting into LLE; at baseline, pain is achy. Wearing clothes such as jeans is painful due to sensitivity of skin; at home wears pajamas. Pt has not returned to driving; he is limited to household distance ambulation or maximum into a doctors office. He feels as though his LLE is going to "give out" when he is walking or weightbearing. 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 Reports  frequent falling since surgery due to LLE giving out; no injuries associated with falls.    PRECAUTIONS: None at this time following 03/13/22 visit with Dr. Izora Ribas     OBJECTIVE: (objective measures completed at initial evaluation unless otherwise dated)   DIAGNOSTIC FINDINGS:  "Intraoperative images during L4-L5 anterior and posterior fusion. No evidence of immediate complication"   PATIENT SURVEYS:  Modified Oswestry (updated 01/09/22 due to incomplete form): 62%.   02/11/22: 48%.  03/04/22: 28% FOTO: eval 23/41,   02/11/22: 44/41   SCREENING FOR RED FLAGS: Bowel or bladder incontinence: No Spinal tumors: No Cauda equina syndrome: No Compression fracture: No Abdominal aneurysm: No   COGNITION:           Overall cognitive status: Within functional limits for tasks assessed                          SENSATION: Light touch: Impaired  - increased sensitivity in left L2 and L3 dermatomes    MUSCLE LENGTH: Hamstrings: unable to test due to increase in sharp/shooting pain in hip flexors prior to HS stretch being felt.  (01/28/22): R lacking 30 deg, L lacking 40 deg.   Thomas test: NT   POSTURE: decreased lumbar lordosis and posterior pelvic tilt   PALPATION: Right: hip flexors and lateral hip, glute med, piriformis, deep external rotators, QL Left: hip flexors, lateral hip and adductors (from proximal hip to mid-thigh), proximal hamstrings, L4-distal paraspinals, QL, all gluteals   OBSERVATION 01/21/22: No bruising, ecchymosis, increased tissue temperature, or erythema observed along L flank, L gluteal region, or adjacent to surgical incisions. No sign of infection or delayed healing   LUMBAR ROM: not assessed due to back precautions    Active  A/PROM  eval  Flexion -  Extension -  Right lateral flexion -  Left lateral flexion -  Right rotation -  Left rotation -   (Blank rows = not tested)   LOWER EXTREMITY ROM:      Passive  Right eval Left eval  Hip flexion 100 90   Hip extension NT NT  Hip abduction 20 20  Hip adduction      Hip internal rotation      Hip external rotation      Knee flexion [facility]  Knee extension [facility]  Ankle dorsiflexion [facility]  Ankle plantarflexion      Ankle inversion      Ankle eversion       (Blank rows = not tested)   LOWER EXTREMITY MMT:     MMT Right eval Left eval Right 02/11/22 Left 02/11/22  Hip flexion 4-* 4-* 4+ 4*  Hip extension          Hip abduction 4+* 4-* 4+ 4+  Hip adduction 4* 4* 4+ 4+  Hip internal rotation 5 3* 5 4*  Hip external rotation 5 4* 5 4*  Knee flexion 5 4* 5 4+  Knee extension 5 4+  5 4  Ankle dorsiflexion 4+ 3+ 5 4  Ankle plantarflexion           (Blank rows = not tested)     FUNCTIONAL TESTS:  5 times sit to stand: 43.37 seconds using arms.    02/11/22: 36.1 seconds.  03/04/22: 27.4 seconds.  6 minute walk test (performed 01/14/22): 280 feet.     02/11/22: 600 feet.  03/04/22: 735 feet. 10 meter walk test: 24.3 seconds (comfortable) = 0.41 m/s using RW; terminated fast-pace for safety.   02/11/22: 0.64 m/s comfortable speed, 0.65 m/s fast speed.  03/04/22: 0.65 m/s fast speed   STS mechanics: no UE support, use of momentum with rapid hip hinge to initiate sit to stand. Intermittent LLE buckling and varus/valgus immediately following initiation of sit to stand             -03/04/22: After 2 attempts pt able to perform sit to stand with no UE support, upward velocity of transfers slows down halfway through transfer with intermittent LLE varus/valgus   GAIT: 03/04/22: Distance walked: 735 feet Assistive device utilized: [facility] Level of assistance: standby assist Comments: good heel to toe pattern, intermittent LLE buckling requiring pt holding wall and intermittently placing hands on his L thigh for support; no assist required from PT to prevent fall     Injury screen 04/08/22 Homan's sign: Negative. Ankle inversion and eversion stress: Negative Dorsiflexion-eversion test: Negative  No  significant color change, erythema, ecchymosis, venous distension, or girth asymmetry between R and LLE      TODAY'S TREATMENT    SUBJECTIVE: Patient reports increase in swelling in his L ankle/foot at end of last week and he held on holiday travels due to this. Patient reports his shoes were getting uncomfortable Thursday and he noticed apparent swelling Friday.      PAIN:  Are you having pain? 2/10  Pain location: pain in L flank of lumbar spine at arrival       Therapeutic Exercise - for improved soft tissue flexibility and extensibility as needed for ROM, improved strength as needed to improve performance of CKC activities/functional movements and prevention of fall during episode of LOB, progressive standing/weightbearing activity to improve capacity for community-level mobility with decreasing reliance on AD/external support    Brief screen performed (see above)   SciFit seated elliptical; Level 4; seat at 10, 6 minutes - subjective information gathered during this time   Total Gym; Level 22; L single-leg minisquat; 1x10 and 1x7 (significant fasciculations and quad weakness during second set)     Forward step up to step to; 2x8; staircase in center of gym    // bars 3-way Hip with Green Tband at knees;1x10 ea dir each LE   Hurdle steps, consecutive; mixed height hurdles, 3 6-inch and 2 12-inch hurdles; 5x D/B     In hallway: Gait with side-to-side ball toss; forward-retro step and sidestepping; 2x D/B each SBA c PT             -no significant LOB or LE buckling today     Sit to stand from standard chair; with Goblet hold c 6-lb Medball; 2x10      On treatment table:   Lower trunk rotations in hooklying; 2x10 alternating R/L Supine piriformis stretch, dynamic; 1 sec on, 1 sec off; x20 in figure-4 position for LLE Figure-4 push; x20, 1 sec with LLE  Standing quadriceps stretch; 1x60sec          *not today* Tandem walk on long Airex pad; 4x  D/B Forward step up to 6-inch step, staircase in center of clinic ; 2x10 with LLE Ambulate laps in gym x 3 with SPC in R hand  LLE sciatic flossing; x20, 1 sec; in hooklying  Standing quad stretch; 2x30 sec, in standing, stance on R foot, R hand holding onto table               Straight leg raise, supine in low mat; 2x10 Forward/backward stepping with therapist guarding patient posteriorly in case of LLE buckling, x 2 D/B 70-ft course Adjacent to single parallel bar, seated on blue physioball, alternating shoulder and hip flexion (opposite sides) with upright posture; 2x10 alternating Bird dog; quadruped; 1x8 alternating Dynamic forward march with contralateral knee touch; 4x D/B length of bars Sidestep with minimal upper limb support, careful guarding with intermittent LLE buckling; x4 D/B 30-ft course Supine dead bug with alternating knee extension/shoulder flexion; 2x10 alternating 3 6-inch hurdles consecutive step-over; step-to pattern x 2 D/B, reciprocal pattern x2 D/B Consecutive forward step-up to Airex pad, 3 Airex pads in // bars; 4x D/B                 Piriformis stretch; 20x, 1 sec Active hamstring stretch (glider technique): 2x10 on each LE Gait with SPC following demo from PT; ambulate around gym x 1 lap with VC for cane/foot sequencing NuStep, seat at 10 and arms at 11; Level 3 resistance; 5 minutes  Dynamic march 3x D/B length of // bars Standing march with bilateral light touch on single parallel bar for complete weight shift to each lower limb; x10 alternating Seated hip abduction with Grn Tband, with abdominal brace and upright posture/neutral spine; 2x10 Abdominal brace with Bridge c neutral spine,  full range; 2x10 Abdominal brace with alternating march and shoulder flexion; 2x10 alternating R/L Standing toe tap onto 6-inch step; 2x10 alternating R/L for weight shifting and weight acceptance onto L lower limb Standing heel raise; 2x10 with bilat support on front-wheeled  walker Abdominal brace/TrA contraction with alternating adductor ball squeeze/hip ABD iso against belt; 2x10, 5 sec  6-minute walk with front-wheel walker Abdominal brace/TrA contraction; x10, 5 sec Glute set; 2x10, 5 sec           HOME EXERCISE PROGRAM: Access Code [email or web address]       ASSESSMENT:   CLINICAL IMPRESSION: Pt does fortunately have low level NPRS at this time, though he does have remaining pain along L lumbar flank and sensitivity affecting L hip/proximal thigh. Discussed today expectations with major orthopedic surgery and long-term prognosis for getting back to his PLOF. Patient has minimal LE buckling today and is able to continue with closed-chain strengthening and stepping drills without notable incident. Pt will benefit from skilled physical therapy to address the noted deficits in strength, weakness, and community-level mobility in order to increase safety with functional activities and allow pt to return to work and home duties.      OBJECTIVE IMPAIRMENTS Abnormal gait, decreased activity tolerance, decreased balance, decreased endurance, decreased mobility, difficulty walking, decreased ROM, decreased strength, decreased safety awareness, impaired flexibility, impaired sensation, postural dysfunction, and pain.    ACTIVITY LIMITATIONS carrying, lifting, bending, sitting, standing, squatting, stairs, and transfers   PARTICIPATION LIMITATIONS: cleaning, laundry, driving, shopping, community activity, occupation, and yard work   PERSONAL FACTORS Time since onset of injury/illness/exacerbation and 1-2 comorbidities: schizophrenia and anxiety  are  also affecting patient's functional outcome.    REHAB POTENTIAL: Good   CLINICAL DECISION MAKING: Evolving/moderate complexity   EVALUATION COMPLEXITY: Moderate     GOALS: Goals reviewed with patient? Yes   SHORT TERM GOALS: Target date: 01/21/2022   Patient will be independent in home exercise program to improve  strength/mobility for better functional independence with ADLs. Baseline:   02/11/22: Pt is compliant with his HEP  Goal status: ACHIEVED   2.  Pt will ambulate 200+ feet using RW without LOB or LLE buckling to demonstrate progression towards improved ambulatory endurance.  Baseline: 171ft .    02/11/22: Pt able to ambulate > 200 feet without LLE buckling Goal status: ACHIEVED   3.   Patient will deny any falls over past 4 weeks to demonstrate improved safety at home.  Baseline: 4 falls since surgery.   02/11/22: 2 falls in June.   03/04/22: No falls in previous month.  03/25/22: Pt last had fall in home 03/22/22 Goal status: PREVIOUSLY MET     LONG TERM GOALS: Target date: 03/04/2022   Patient will increase FOTO score to equal to or greater than 41 to demonstrate statistically significant improvement in mobility and quality of life.  Baseline: 01/07/22: 23/41.  7/11/2: 44/41 Goal status: ACHIEVED   2.   Patient (< 68 years old) will complete five times sit to stand test in < 10 seconds indicating an increased LE strength and improved balance. Baseline: 01/07/22: 43 seconds.   02/11/22: 36.1 sec.  03/04/22: 27.4 seconds.    03/18/22: 21.6 seconds.  Goal status: IN PROGRESS   3.  Patient will increase 10 meter walk test to >1.33m/s as to improve gait speed for better community ambulation and to reduce fall risk. Baseline: 01/07/22: 0.41 m/s.   02/11/22: 0.65 m/s.    03/04/22: 0.65 m/s fast speed   03/18/22: 0.90 m/s fast speed Goal status: IN PROGRESS   4.  Patient will increase six minute walk test distance to >1000 for progression to community ambulator and improve gait ability. Baseline: 01/14/22: 280 feet.  02/11/22: 600 feet.   03/04/22: 735 feet.   03/18/22: 840 feet.  Goal status: IN PROGRESS   5.  Patient will increase LLE gross strength to 4/5 as to improve functional strength for independent gait, increased standing tolerance and increased ADL ability. Baseline: 01/07/22: ranging 3-4/5, please see  objective.   02/11/22: 4 to 4+/5 MMTs, see above.  Goal status: ACHIEVED         PLAN: PT FREQUENCY: 2x/week   PT DURATION: 4-6 weeks   PLANNED INTERVENTIONS: Therapeutic exercises, Therapeutic activity, Neuromuscular re-education, Balance training, Gait training, Patient/Family education, Joint mobilization, Stair training, DME instructions, Dry Needling, Cryotherapy, Moist heat, and Manual therapy.   PLAN FOR NEXT SESSION: LE strengthening, trunk stabilization, LE flexibility. Progress with increasing emphasis on standing/weightbearing activity and continued AD wean as able from [facility] to no AD. Modalities and manual therapy for pain modulation as needed.      Eilleen Kempf, PT 04/08/2022, 11:24 AM

## 2022-04-09 ENCOUNTER — Encounter: Payer: Self-pay | Admitting: Physical Therapy

## 2022-04-10 ENCOUNTER — Ambulatory Visit: Payer: Medicare Other | Admitting: Physical Therapy

## 2022-04-10 ENCOUNTER — Encounter: Payer: Self-pay | Admitting: Physical Therapy

## 2022-04-10 DIAGNOSIS — R2681 Unsteadiness on feet: Secondary | ICD-10-CM

## 2022-04-10 DIAGNOSIS — M6281 Muscle weakness (generalized): Secondary | ICD-10-CM

## 2022-04-10 DIAGNOSIS — M545 Low back pain, unspecified: Secondary | ICD-10-CM

## 2022-04-10 DIAGNOSIS — Z981 Arthrodesis status: Secondary | ICD-10-CM

## 2022-04-10 DIAGNOSIS — R269 Unspecified abnormalities of gait and mobility: Secondary | ICD-10-CM

## 2022-04-10 NOTE — Therapy (Signed)
OUTPATIENT PHYSICAL THERAPY TREATMENT NOTE   Patient Name: Justin Nixon MRN: [id number] DOB:1975/12/21, 46 y.o., male Today's Date: 04/10/2022   END OF SESSION:   PT End of Session - 04/10/22 1111     Visit Number 27    Number of Visits 28    Date for PT Re-Evaluation 04/24/22    Progress Note Due on Visit 28    PT Start Time 1113    PT Stop Time 1155    PT Time Calculation (min) 42 min    Equipment Utilized During Treatment Gait belt    Activity Tolerance Patient limited by fatigue;Patient limited by pain    Behavior During Therapy WFL for tasks assessed/performed             Past Medical History:  Diagnosis Date   Anxiety    Arthritis    GERD (gastroesophageal reflux disease)    H/O   Hyperlipidemia    Schizophrenia ([place])    Past Surgical History:  Procedure Laterality Date   ANTERIOR LATERAL LUMBAR FUSION WITH PERCUTANEOUS SCREW 1 LEVEL N/A 11/18/2021   Procedure: L4-5 LATERAL LUMBAR INTERBODY FUSION WITH POSTERIOR SPINAL FUSION;  Surgeon: Meade Maw, MD;  Location: [facility];  Service: Neurosurgery;  Laterality: N/A;   APPLICATION OF INTRAOPERATIVE CT SCAN N/A 11/18/2021   Procedure: APPLICATION OF INTRAOPERATIVE CT SCAN;  Surgeon: Meade Maw, MD;  Location: [facility];  Service: Neurosurgery;  Laterality: N/A;   COLONOSCOPY     CYST EXCISION     ELBOW ARTHROSCOPY Left 10/10/2020   Procedure: LEFT ELBOW ARTHROSCOPY WITH DEBRIDEMENT AND EXCISION OF LOOSE BODIES;  Surgeon: Corky Mull, MD;  Location: [facility];  Service: Orthopedics;  Laterality: Left;   MYRINGOTOMY WITH TUBE PLACEMENT     ORIF ELBOW FRACTURE Left 08/28/2021   Procedure: OPEN REDUCTION INTERNAL FIXATION (ORIF) ELBOW/OLECRANON FRACTURE;  Surgeon: Corky Mull, MD;  Location: [facility];  Service: Orthopedics;  Laterality: Left;   SHOULDER ARTHROSCOPY Left 03/18/2017   Procedure: ARTHROSCOPY SHOULDER with debridement decompression and distal clavicle excision;  Surgeon: Corky Mull, MD;   Location: [facility];  Service: Orthopedics;  Laterality: Left;   SHOULDER ARTHROSCOPY WITH OPEN ROTATOR CUFF REPAIR Right 09/23/2016   Procedure: SHOULDER ARTHROSCOPY WITH OPEN ROTATOR CUFF REPAIR;  Surgeon: Corky Mull, MD;  Location: [facility];  Service: Orthopedics;  Laterality: Right;   SHOULDER ARTHROSCOPY WITH SUBACROMIAL DECOMPRESSION Right 09/23/2016   Procedure: SHOULDER ARTHROSCOPY WITH SUBACROMIAL DECOMPRESSION AND DEBRIDEMENT;  Surgeon: Corky Mull, MD;  Location: [facility];  Service: Orthopedics;  Laterality: Right;   Patient Active Problem List   Diagnosis Date Noted   S/P spinal fusion 11/18/2021   Olecranon fracture, left, open type I or II, initial encounter 08/28/2021      PCP: Baxter Hire MD REFERRING PROVIDER: Meade Maw, MD     REFERRING DIAG: Z98.1 (ICD-10-CM) - History of lumbar fusion   THERAPY DIAG:  Muscle weakness (generalized)   S/P lumbar fusion   Abnormality of gait and mobility   Unsteadiness on feet   Rationale for Evaluation and Treatment Rehabilitation   PERTINENT HISTORY: Pt presents to physical therapy for evaluation of low back and LE pain and weakness L>R, following L4-L5 XLIF/PSF on 11/18/21. Pt states he was using a cane before surgery due to onset of LLE weakness. States he has now been using a RW since surgery. Pain ranges from a 5/10 to a 10/10. When aggravated pain is described as sharp and shooting into  LLE; at baseline, pain is achy. Wearing clothes such as jeans is painful due to sensitivity of skin; at home wears pajamas. Pt has not returned to driving; he is limited to household distance ambulation or maximum into a doctors office. He feels as though his LLE is going to "give out" when he is walking or weightbearing. 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 Reports frequent falling since surgery due to LLE  giving out; no injuries associated with falls.    PRECAUTIONS: None at this time following 03/13/22 visit with Dr. Izora Ribas     OBJECTIVE: (objective measures completed at initial evaluation unless otherwise dated)   DIAGNOSTIC FINDINGS:  "Intraoperative images during L4-L5 anterior and posterior fusion. No evidence of immediate complication"   PATIENT SURVEYS:  Modified Oswestry (updated 01/09/22 due to incomplete form): 62%.   02/11/22: 48%.  03/04/22: 28% FOTO: eval 23/41,   02/11/22: 44/41   SCREENING FOR RED FLAGS: Bowel or bladder incontinence: No Spinal tumors: No Cauda equina syndrome: No Compression fracture: No Abdominal aneurysm: No   COGNITION:           Overall cognitive status: Within functional limits for tasks assessed                          SENSATION: Light touch: Impaired  - increased sensitivity in left L2 and L3 dermatomes    MUSCLE LENGTH: Hamstrings: unable to test due to increase in sharp/shooting pain in hip flexors prior to HS stretch being felt.  (01/28/22): R lacking 30 deg, L lacking 40 deg.   Thomas test: NT   POSTURE: decreased lumbar lordosis and posterior pelvic tilt   PALPATION: Right: hip flexors and lateral hip, glute med, piriformis, deep external rotators, QL Left: hip flexors, lateral hip and adductors (from proximal hip to mid-thigh), proximal hamstrings, L4-distal paraspinals, QL, all gluteals   OBSERVATION 01/21/22: No bruising, ecchymosis, increased tissue temperature, or erythema observed along L flank, L gluteal region, or adjacent to surgical incisions. No sign of infection or delayed healing   LUMBAR ROM: not assessed due to back precautions    Active  A/PROM  eval  Flexion -  Extension -  Right lateral flexion -  Left lateral flexion -  Right rotation -  Left rotation -   (Blank rows = not tested)   LOWER EXTREMITY ROM:      Passive  Right eval Left eval  Hip flexion 100 90  Hip extension NT NT  Hip abduction 20 20   Hip adduction      Hip internal rotation      Hip external rotation      Knee flexion [facility]  Knee extension [facility]  Ankle dorsiflexion [facility]  Ankle plantarflexion      Ankle inversion      Ankle eversion       (Blank rows = not tested)   LOWER EXTREMITY MMT:     MMT Right eval Left eval Right 02/11/22 Left 02/11/22  Hip flexion 4-* 4-* 4+ 4*  Hip extension          Hip abduction 4+* 4-* 4+ 4+  Hip adduction 4* 4* 4+ 4+  Hip internal rotation 5 3* 5 4*  Hip external rotation 5 4* 5 4*  Knee flexion 5 4* 5 4+  Knee extension 5 4+ 5 4  Ankle dorsiflexion  4+ 3+ 5 4  Ankle plantarflexion           (Blank rows = not tested)     FUNCTIONAL TESTS:  5 times sit to stand: 43.37 seconds using arms.    02/11/22: 36.1 seconds.  03/04/22: 27.4 seconds.  6 minute walk test (performed 01/14/22): 280 feet.     02/11/22: 600 feet.  03/04/22: 735 feet. 10 meter walk test: 24.3 seconds (comfortable) = 0.41 m/s using RW; terminated fast-pace for safety.   02/11/22: 0.64 m/s comfortable speed, 0.65 m/s fast speed.  03/04/22: 0.65 m/s fast speed   STS mechanics: no UE support, use of momentum with rapid hip hinge to initiate sit to stand. Intermittent LLE buckling and varus/valgus immediately following initiation of sit to stand             -03/04/22: After 2 attempts pt able to perform sit to stand with no UE support, upward velocity of transfers slows down halfway through transfer with intermittent LLE varus/valgus   GAIT: 03/04/22: Distance walked: 735 feet Assistive device utilized: [facility] Level of assistance: standby assist Comments: good heel to toe pattern, intermittent LLE buckling requiring pt holding wall and intermittently placing hands on his L thigh for support; no assist required from PT to prevent fall     Injury screen 04/08/22 Homan's sign: Negative. Ankle inversion and eversion stress: Negative Dorsiflexion-eversion test: Negative  No significant color change, erythema, ecchymosis,  venous distension, or girth asymmetry along calf between R and LLE. Pt has non-pitting edema along distal tibia medially       TODAY'S TREATMENT    SUBJECTIVE: Patient reports no significant issues at arrival this AM. Patient reports getting around well the last 2 days in his home. Patient reports some mild edema remaining in his L ankle. Pt denies notable pain in his L leg/ankle. Patient reports no significant integumentary changes in his L leg.      PAIN:  Are you having pain? 2/10  Pain location: pain in L flank of lumbar spine at arrival       Therapeutic Exercise - for improved soft tissue flexibility and extensibility as needed for ROM, improved strength as needed to improve performance of CKC activities/functional movements and prevention of fall during episode of LOB, progressive standing/weightbearing activity to improve capacity for community-level mobility with decreasing reliance on AD/external support     SciFit seated elliptical; Level 6; seat at 10, 5 minutes - subjective information gathered during this time   Total Gym; Level 22; L single-leg minisquat; 1x10, 1x6, 1x5 (significant fasciculations and quad weakness during second set, decreased reps per set)     Forward step up to stap tap on 2nd step; 2x8; staircase in center of gym      // bars: 3-way Hip with Green Tband at ankles;1x8 ea dir each LE  Hurdle steps, consecutive; mixed height hurdles, 3 6-inch and 2 12-inch hurdles; 5x D/B forward stepping AND lateral Unipedal stance; 3x10 sec (Adjacent to single parallel bar, outside of bars) Alternating arm/leg with one second isometric hold on blue physioball; 2x10 alternating     In hallway: Gait with ball toss to/from PT; forward-retro step and sidestepping; 3x D/B each SBA c PT     PATIENT EDUCATION: Reviewed desensitization program and using variety of textures with expected progression in coarseness of textures as tolerated.       *next visit*  Sit to  stand from standard chair; with Goblet hold c 6-lb Medball; 2x10    *  not today* Lower trunk rotations in hooklying; 2x10 alternating R/L Supine piriformis stretch, dynamic; 1 sec on, 1 sec off; x20 in figure-4 position for LLE Figure-4 push; x20, 1 sec with LLE                         Standing quadriceps stretch; 1x60sec Tandem walk on long Airex pad; 4x D/B Forward step up to 6-inch step, staircase in center of clinic ; 2x10 with LLE Ambulate laps in gym x 3 with SPC in R hand  LLE sciatic flossing; x20, 1 sec; in hooklying  Standing quad stretch; 2x30 sec, in standing, stance on R foot, R hand holding onto table               Straight leg raise, supine in low mat; 2x10 Forward/backward stepping with therapist guarding patient posteriorly in case of LLE buckling, x 2 D/B 70-ft course Adjacent to single parallel bar, seated on blue physioball, alternating shoulder and hip flexion (opposite sides) with upright posture; 2x10 alternating Bird dog; quadruped; 1x8 alternating Dynamic forward march with contralateral knee touch; 4x D/B length of bars Sidestep with minimal upper limb support, careful guarding with intermittent LLE buckling; x4 D/B 30-ft course Supine dead bug with alternating knee extension/shoulder flexion; 2x10 alternating 3 6-inch hurdles consecutive step-over; step-to pattern x 2 D/B, reciprocal pattern x2 D/B Consecutive forward step-up to Airex pad, 3 Airex pads in // bars; 4x D/B                 Piriformis stretch; 20x, 1 sec Active hamstring stretch (glider technique): 2x10 on each LE Gait with SPC following demo from PT; ambulate around gym x 1 lap with VC for cane/foot sequencing NuStep, seat at 10 and arms at 11; Level 3 resistance; 5 minutes  Dynamic march 3x D/B length of // bars Standing march with bilateral light touch on single parallel bar for complete weight shift to each lower limb; x10 alternating Seated hip abduction with Grn Tband, with abdominal brace and  upright posture/neutral spine; 2x10 Abdominal brace with Bridge c neutral spine,  full range; 2x10 Abdominal brace with alternating march and shoulder flexion; 2x10 alternating R/L Standing toe tap onto 6-inch step; 2x10 alternating R/L for weight shifting and weight acceptance onto L lower limb Standing heel raise; 2x10 with bilat support on front-wheeled walker Abdominal brace/TrA contraction with alternating adductor ball squeeze/hip ABD iso against belt; 2x10, 5 sec  6-minute walk with front-wheel walker Abdominal brace/TrA contraction; x10, 5 sec Glute set; 2x10, 5 sec           HOME EXERCISE PROGRAM: Access Code [email or web address]       ASSESSMENT:   CLINICAL IMPRESSION: Patient has remaining mild L ankle swelling with no significant pain or other concerning S&S. Pt demonstrates good gait pattern with only 2 episodes of significant buckling noted today. Pt is able to progress with unipedal stance on affected LE and continue with heavy emphasis on functional exercise and LE strengthening. Pt will benefit from skilled physical therapy to address the noted deficits in strength, weakness, and community-level mobility in order to increase safety with functional activities and allow pt to return to work and home duties.      OBJECTIVE IMPAIRMENTS Abnormal gait, decreased activity tolerance, decreased balance, decreased endurance, decreased mobility, difficulty walking, decreased ROM, decreased strength, decreased safety awareness, impaired flexibility, impaired sensation, postural dysfunction, and pain.    ACTIVITY LIMITATIONS carrying, lifting, bending, sitting, standing, squatting,  stairs, and transfers   PARTICIPATION LIMITATIONS: cleaning, laundry, driving, shopping, community activity, occupation, and yard work   PERSONAL FACTORS Time since onset of injury/illness/exacerbation and 1-2 comorbidities: schizophrenia and anxiety  are also affecting patient's functional outcome.    REHAB  POTENTIAL: Good   CLINICAL DECISION MAKING: Evolving/moderate complexity   EVALUATION COMPLEXITY: Moderate     GOALS: Goals reviewed with patient? Yes   SHORT TERM GOALS: Target date: 01/21/2022   Patient will be independent in home exercise program to improve strength/mobility for better functional independence with ADLs. Baseline:   02/11/22: Pt is compliant with his HEP  Goal status: ACHIEVED   2.  Pt will ambulate 200+ feet using RW without LOB or LLE buckling to demonstrate progression towards improved ambulatory endurance.  Baseline: 172ft .    02/11/22: Pt able to ambulate > 200 feet without LLE buckling Goal status: ACHIEVED   3.   Patient will deny any falls over past 4 weeks to demonstrate improved safety at home.  Baseline: 4 falls since surgery.   02/11/22: 2 falls in June.   03/04/22: No falls in previous month.  03/25/22: Pt last had fall in home 03/22/22 Goal status: PREVIOUSLY MET     LONG TERM GOALS: Target date: 03/04/2022   Patient will increase FOTO score to equal to or greater than 41 to demonstrate statistically significant improvement in mobility and quality of life.  Baseline: 01/07/22: 23/41.  7/11/2: 44/41 Goal status: ACHIEVED   2.   Patient (< 76 years old) will complete five times sit to stand test in < 10 seconds indicating an increased LE strength and improved balance. Baseline: 01/07/22: 43 seconds.   02/11/22: 36.1 sec.  03/04/22: 27.4 seconds.    03/18/22: 21.6 seconds.  Goal status: IN PROGRESS   3.  Patient will increase 10 meter walk test to >1.45m/s as to improve gait speed for better community ambulation and to reduce fall risk. Baseline: 01/07/22: 0.41 m/s.   02/11/22: 0.65 m/s.    03/04/22: 0.65 m/s fast speed   03/18/22: 0.90 m/s fast speed Goal status: IN PROGRESS   4.  Patient will increase six minute walk test distance to >1000 for progression to community ambulator and improve gait ability. Baseline: 01/14/22: 280 feet.  02/11/22: 600 feet.   03/04/22: 735  feet.   03/18/22: 840 feet.  Goal status: IN PROGRESS   5.  Patient will increase LLE gross strength to 4/5 as to improve functional strength for independent gait, increased standing tolerance and increased ADL ability. Baseline: 01/07/22: ranging 3-4/5, please see objective.   02/11/22: 4 to 4+/5 MMTs, see above.  Goal status: ACHIEVED         PLAN: PT FREQUENCY: 2x/week   PT DURATION: 4-6 weeks   PLANNED INTERVENTIONS: Therapeutic exercises, Therapeutic activity, Neuromuscular re-education, Balance training, Gait training, Patient/Family education, Joint mobilization, Stair training, DME instructions, Dry Needling, Cryotherapy, Moist heat, and Manual therapy.   PLAN FOR NEXT SESSION: LE strengthening, trunk stabilization, LE flexibility. Progress with increasing emphasis on standing/weightbearing activity and continued AD wean as able from [facility] to no AD. Modalities and manual therapy for pain modulation as needed. F/u on LLE edema.     Valentina Gu, PT, DPT #[id number]  Eilleen Kempf, PT 8:24 PM 04/12/2022

## 2022-04-15 ENCOUNTER — Ambulatory Visit: Payer: Medicare Other | Admitting: Physical Therapy

## 2022-04-15 DIAGNOSIS — M545 Low back pain, unspecified: Secondary | ICD-10-CM

## 2022-04-15 DIAGNOSIS — M6281 Muscle weakness (generalized): Secondary | ICD-10-CM

## 2022-04-15 DIAGNOSIS — Z981 Arthrodesis status: Secondary | ICD-10-CM

## 2022-04-15 DIAGNOSIS — R2681 Unsteadiness on feet: Secondary | ICD-10-CM

## 2022-04-15 DIAGNOSIS — R269 Unspecified abnormalities of gait and mobility: Secondary | ICD-10-CM

## 2022-04-15 NOTE — Therapy (Signed)
OUTPATIENT PHYSICAL THERAPY TREATMENT NOTE/GOAL UPDATE AND RE-CERTIFICATION   Patient Name: DAOUD LOBUE MRN: [id number] DOB:1976-05-05, 46 y.o., male Today's Date: 04/15/2022   END OF SESSION:   PT End of Session - 04/15/22 1121     Visit Number 28    Number of Visits 36    Date for PT Re-Evaluation 04/24/22    Progress Note Due on Visit 28    PT Start Time 1116    PT Stop Time 1159    PT Time Calculation (min) 43 min    Equipment Utilized During Treatment Gait belt    Activity Tolerance Patient limited by fatigue;Patient limited by pain    Behavior During Therapy WFL for tasks assessed/performed              Past Medical History:  Diagnosis Date   Anxiety    Arthritis    GERD (gastroesophageal reflux disease)    H/O   Hyperlipidemia    Schizophrenia ([place])    Past Surgical History:  Procedure Laterality Date   ANTERIOR LATERAL LUMBAR FUSION WITH PERCUTANEOUS SCREW 1 LEVEL N/A 11/18/2021   Procedure: L4-5 LATERAL LUMBAR INTERBODY FUSION WITH POSTERIOR SPINAL FUSION;  Surgeon: Meade Maw, MD;  Location: [facility];  Service: Neurosurgery;  Laterality: N/A;   APPLICATION OF INTRAOPERATIVE CT SCAN N/A 11/18/2021   Procedure: APPLICATION OF INTRAOPERATIVE CT SCAN;  Surgeon: Meade Maw, MD;  Location: [facility];  Service: Neurosurgery;  Laterality: N/A;   COLONOSCOPY     CYST EXCISION     ELBOW ARTHROSCOPY Left 10/10/2020   Procedure: LEFT ELBOW ARTHROSCOPY WITH DEBRIDEMENT AND EXCISION OF LOOSE BODIES;  Surgeon: Corky Mull, MD;  Location: [facility];  Service: Orthopedics;  Laterality: Left;   MYRINGOTOMY WITH TUBE PLACEMENT     ORIF ELBOW FRACTURE Left 08/28/2021   Procedure: OPEN REDUCTION INTERNAL FIXATION (ORIF) ELBOW/OLECRANON FRACTURE;  Surgeon: Corky Mull, MD;  Location: [facility];  Service: Orthopedics;  Laterality: Left;   SHOULDER ARTHROSCOPY Left 03/18/2017   Procedure: ARTHROSCOPY SHOULDER with debridement decompression and distal clavicle  excision;  Surgeon: Corky Mull, MD;  Location: [place];  Service: Orthopedics;  Laterality: Left;   SHOULDER ARTHROSCOPY WITH OPEN ROTATOR CUFF REPAIR Right 09/23/2016   Procedure: SHOULDER ARTHROSCOPY WITH OPEN ROTATOR CUFF REPAIR;  Surgeon: Corky Mull, MD;  Location: [facility];  Service: Orthopedics;  Laterality: Right;   SHOULDER ARTHROSCOPY WITH SUBACROMIAL DECOMPRESSION Right 09/23/2016   Procedure: SHOULDER ARTHROSCOPY WITH SUBACROMIAL DECOMPRESSION AND DEBRIDEMENT;  Surgeon: Corky Mull, MD;  Location: [facility];  Service: Orthopedics;  Laterality: Right;   Patient Active Problem List   Diagnosis Date Noted   S/P spinal fusion 11/18/2021   Olecranon fracture, left, open type I or II, initial encounter 08/28/2021      PCP: Baxter Hire MD REFERRING PROVIDER: Meade Maw, MD     REFERRING DIAG: Z98.1 (ICD-10-CM) - History of lumbar fusion   THERAPY DIAG:  Muscle weakness (generalized)   S/P lumbar fusion   Abnormality of gait and mobility   Unsteadiness on feet   Rationale for Evaluation and Treatment Rehabilitation   PERTINENT HISTORY: Pt presents to physical therapy for evaluation of low back and LE pain and weakness L>R, following L4-L5 XLIF/PSF on 11/18/21. Pt states he was using a cane before surgery due to onset of LLE weakness. States he has now been using a RW since surgery. Pain ranges from a 5/10 to a 10/10. When aggravated pain is described as  sharp and shooting into LLE; at baseline, pain is achy. Wearing clothes such as jeans is painful due to sensitivity of skin; at home wears pajamas. Pt has not returned to driving; he is limited to household distance ambulation or maximum into a doctors office. He feels as though his LLE is going to "give out" when he is walking or weightbearing. 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 Reports  frequent falling since surgery due to LLE giving out; no injuries associated with falls.    PRECAUTIONS: None at this time following 03/13/22 visit with Dr. Izora Ribas     OBJECTIVE: (objective measures completed at initial evaluation unless otherwise dated)   DIAGNOSTIC FINDINGS:  "Intraoperative images during L4-L5 anterior and posterior fusion. No evidence of immediate complication"   PATIENT SURVEYS:  Modified Oswestry (updated 01/09/22 due to incomplete form): 62%.   02/11/22: 48%.  03/04/22: 28% FOTO: eval 23/41,   02/11/22: 44/41   SCREENING FOR RED FLAGS: Bowel or bladder incontinence: No Spinal tumors: No Cauda equina syndrome: No Compression fracture: No Abdominal aneurysm: No   COGNITION:           Overall cognitive status: Within functional limits for tasks assessed                          SENSATION: Light touch: Impaired  - increased sensitivity in left L2 and L3 dermatomes    MUSCLE LENGTH: Hamstrings: unable to test due to increase in sharp/shooting pain in hip flexors prior to HS stretch being felt.  (01/28/22): R lacking 30 deg, L lacking 40 deg.   Thomas test: NT   POSTURE: decreased lumbar lordosis and posterior pelvic tilt   PALPATION: Right: hip flexors and lateral hip, glute med, piriformis, deep external rotators, QL Left: hip flexors, lateral hip and adductors (from proximal hip to mid-thigh), proximal hamstrings, L4-distal paraspinals, QL, all gluteals   OBSERVATION 01/21/22: No bruising, ecchymosis, increased tissue temperature, or erythema observed along L flank, L gluteal region, or adjacent to surgical incisions. No sign of infection or delayed healing   LUMBAR ROM: not assessed due to back precautions    Active  A/PROM  eval  Flexion -  Extension -  Right lateral flexion -  Left lateral flexion -  Right rotation -  Left rotation -   (Blank rows = not tested)   LOWER EXTREMITY ROM:      Passive  Right eval Left eval  Hip flexion 100 90   Hip extension NT NT  Hip abduction 20 20  Hip adduction      Hip internal rotation      Hip external rotation      Knee flexion [facility]  Knee extension [facility]  Ankle dorsiflexion [facility]  Ankle plantarflexion      Ankle inversion      Ankle eversion       (Blank rows = not tested)   LOWER EXTREMITY MMT:     MMT Right eval Left eval Right 02/11/22 Left 02/11/22  Hip flexion 4-* 4-* 4+ 4*  Hip extension          Hip abduction 4+* 4-* 4+ 4+  Hip adduction 4* 4* 4+ 4+  Hip internal rotation 5 3* 5 4*  Hip external rotation 5 4* 5 4*  Knee flexion 5 4* 5 4+  Knee extension 5 4+ 5  4  Ankle dorsiflexion 4+ 3+ 5 4  Ankle plantarflexion           (Blank rows = not tested)     FUNCTIONAL TESTS:  5 times sit to stand: 43.37 seconds using arms.    02/11/22: 36.1 seconds.  03/04/22: 27.4 seconds.  6 minute walk test (performed 01/14/22): 280 feet.     02/11/22: 600 feet.  03/04/22: 735 feet. 10 meter walk test: 24.3 seconds (comfortable) = 0.41 m/s using RW; terminated fast-pace for safety.   02/11/22: 0.64 m/s comfortable speed, 0.65 m/s fast speed.  03/04/22: 0.65 m/s fast speed   STS mechanics: no UE support, use of momentum with rapid hip hinge to initiate sit to stand. Intermittent LLE buckling and varus/valgus immediately following initiation of sit to stand             -03/04/22: After 2 attempts pt able to perform sit to stand with no UE support, upward velocity of transfers slows down halfway through transfer with intermittent LLE varus/valgus   GAIT: 03/04/22: Distance walked: 735 feet Assistive device utilized: [facility] Level of assistance: standby assist Comments: good heel to toe pattern, intermittent LLE buckling requiring pt holding wall and intermittently placing hands on his L thigh for support; no assist required from PT to prevent fall     Injury screen 04/08/22 Homan's sign: Negative. Ankle inversion and eversion stress: Negative Dorsiflexion-eversion test: Negative  No  significant color change, erythema, ecchymosis, venous distension, or girth asymmetry along calf between R and LLE. Pt has non-pitting edema along distal tibia medially       TODAY'S TREATMENT    SUBJECTIVE: Patient reports ongoing intermittent buckling of his L lower limb. He reports his L foot "cramped up" during prolonged standing in his kitchen over the weekend while preparing a meal. He reports he was able to rest and this did improve. No ankle/foot edema, ecchymosis, color change. Patient reports compliance with HEP. Pt reports 70% SANE score at this time. Patient reports he wants to return to normal walking without his leg giving way. 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      PAIN:  Are you having pain? 2/10  Pain location: pain in L flank of lumbar spine at arrival       Therapeutic Exercise - for improved soft tissue flexibility and extensibility as needed for ROM, improved strength as needed to improve performance of CKC activities/functional movements and prevention of fall during episode of LOB, progressive standing/weightbearing activity to improve capacity for community-level mobility with decreasing reliance on AD/external support   *Goal update and functional outcome measures performed  (see goal section below for most recent updates)  SciFit seated elliptical; Level 6.5; seat at 10, 5 minutes - subjective information gathered during this time   Total Gym; Level 22; L single-leg minisquat; 1x10, 1x5, 1x6  (completed "drop set" strategy for 3 sets due to L quad fatigability)        // bars: Forward lunge; 2x6, careful CGA with PT due to intermittent LLE buckling Hurdle steps, consecutive; mixed height hurdles, 3 6-inch and 2 12-inch hurdles; 5x D/B forward stepping AND lateral Unipedal stance; 1x20 and 1x30 RLE, 2x10 LLE (Adjacent to single parallel bar, outside of bars) Alternating arm/leg with  one second isometric hold on blue physioball; 2x10 alternating       PATIENT EDUCATION: Discussed current progress with PT and goals  of PT, increasing intensity for closed-chain LLE strengthening. Discussed upcoming surgery for R hand and continued POC for post-fusion rehab.        *next visit*  3-way Hip with Green Tband at ankles;1x8 ea dir each LE  Forward step up to stap tap on 2nd step; 2x8; staircase in center of gym Sit to stand from standard chair; with Goblet hold c 6-lb Medball; 2x10    *not today* Gait with ball toss to/from PT; forward-retro step and sidestepping; 3x D/B each SBA c PT Lower trunk rotations in hooklying; 2x10 alternating R/L Supine piriformis stretch, dynamic; 1 sec on, 1 sec off; x20 in figure-4 position for LLE Figure-4 push; x20, 1 sec with LLE                         Standing quadriceps stretch; 1x60sec Tandem walk on long Airex pad; 4x D/B Forward step up to 6-inch step, staircase in center of clinic ; 2x10 with LLE Ambulate laps in gym x 3 with SPC in R hand  LLE sciatic flossing; x20, 1 sec; in hooklying  Standing quad stretch; 2x30 sec, in standing, stance on R foot, R hand holding onto table               Straight leg raise, supine in low mat; 2x10 Forward/backward stepping with therapist guarding patient posteriorly in case of LLE buckling, x 2 D/B 70-ft course Adjacent to single parallel bar, seated on blue physioball, alternating shoulder and hip flexion (opposite sides) with upright posture; 2x10 alternating Bird dog; quadruped; 1x8 alternating Dynamic forward march with contralateral knee touch; 4x D/B length of bars Sidestep with minimal upper limb support, careful guarding with intermittent LLE buckling; x4 D/B 30-ft course Supine dead bug with alternating knee extension/shoulder flexion; 2x10 alternating 3 6-inch hurdles consecutive step-over; step-to pattern x 2 D/B, reciprocal pattern x2 D/B Consecutive forward step-up to Airex pad, 3  Airex pads in // bars; 4x D/B                 Piriformis stretch; 20x, 1 sec Active hamstring stretch (glider technique): 2x10 on each LE Gait with SPC following demo from PT; ambulate around gym x 1 lap with VC for cane/foot sequencing NuStep, seat at 10 and arms at 11; Level 3 resistance; 5 minutes  Dynamic march 3x D/B length of // bars Standing march with bilateral light touch on single parallel bar for complete weight shift to each lower limb; x10 alternating Seated hip abduction with Grn Tband, with abdominal brace and upright posture/neutral spine; 2x10 Abdominal brace with Bridge c neutral spine,  full range; 2x10 Abdominal brace with alternating march and shoulder flexion; 2x10 alternating R/L Standing toe tap onto 6-inch step; 2x10 alternating R/L for weight shifting and weight acceptance onto L lower limb Standing heel raise; 2x10 with bilat support on front-wheeled walker Abdominal brace/TrA contraction with alternating adductor ball squeeze/hip ABD iso against belt; 2x10, 5 sec  6-minute walk with front-wheel walker Abdominal brace/TrA contraction; x10, 5 sec Glute set; 2x10, 5 sec           HOME EXERCISE PROGRAM: Access Code [email or web address]       ASSESSMENT:   CLINICAL IMPRESSION: Patient has continued improvement with FOTO score (FOTO goal surpassed) and improved 5-times sit to stand testing. Pt has not yet met goal for 6MWT, 10-meter gait speed, or 5TSTS. 6MWT and gait speed testing are unchanged relative to last goal update 03/18/22. Pt  is on cusp of meeting short-term goal again for fall history (previously met, but patient had fall on 03/22/22). Patient is most concerned with difficulty walking and LE strength on his left side limiting him from prolonged walking and standing activity. Pt has upcoming procedure in late October for his R hand. Will continue PT for post-fusion rehab until mid-October to maximize strength and level of function. Pt has remaining deficits in LLE  strength, LLE postural control, gait stability, and intermittent LLE buckling with prolonged ambulatory/standing activity. Pt will benefit from skilled physical therapy to address the noted deficits in strength, weakness, and community-level mobility in order to increase safety with functional activities and allow pt to return to work and home duties.      OBJECTIVE IMPAIRMENTS Abnormal gait, decreased activity tolerance, decreased balance, decreased endurance, decreased mobility, difficulty walking, decreased ROM, decreased strength, decreased safety awareness, impaired flexibility, impaired sensation, postural dysfunction, and pain.    ACTIVITY LIMITATIONS carrying, lifting, bending, sitting, standing, squatting, stairs, and transfers   PARTICIPATION LIMITATIONS: cleaning, laundry, driving, shopping, community activity, occupation, and yard work   PERSONAL FACTORS Time since onset of injury/illness/exacerbation and 1-2 comorbidities: schizophrenia and anxiety  are also affecting patient's functional outcome.    REHAB POTENTIAL: Good   CLINICAL DECISION MAKING: Evolving/moderate complexity   EVALUATION COMPLEXITY: Moderate     GOALS: Goals reviewed with patient? Yes   SHORT TERM GOALS: Target date: 01/21/2022   Patient will be independent in home exercise program to improve strength/mobility for better functional independence with ADLs. Baseline:   02/11/22: Pt is compliant with his HEP  Goal status: ACHIEVED   2.  Pt will ambulate 200+ feet using RW without LOB or LLE buckling to demonstrate progression towards improved ambulatory endurance.  Baseline: 161f .    02/11/22: Pt able to ambulate > 200 feet without LLE buckling Goal status: ACHIEVED   3.   Patient will deny any falls over past 4 weeks to demonstrate improved safety at home.  Baseline: 4 falls since surgery.   02/11/22: 2 falls in June.   03/04/22: No falls in previous month.  03/25/22: Pt last had fall in home 03/22/22.   04/15/22: One fall in home about 3.5 weeks ago.   Goal status: NOT MET      LONG TERM GOALS: Target date: 03/04/2022   Patient will increase FOTO score to equal to or greater than 41 to demonstrate statistically significant improvement in mobility and quality of life.  Baseline: 01/07/22: 23/41.  7/11/2: 44/41.  04/15/22: 52/41 Goal status: ACHIEVED   2.   Patient (< 680years old) will complete five times sit to stand test in < 10 seconds indicating an increased LE strength and improved balance. Baseline: 01/07/22: 43 seconds.   02/11/22: 36.1 sec.  03/04/22: 27.4 seconds.    03/18/22: 21.6 seconds.  04/15/22: 16 seconds.  Goal status: IN PROGRESS   3.  Patient will increase 10 meter walk test to >1.060m as to improve gait speed for better community ambulation and to reduce fall risk. Baseline: 01/07/22: 0.41 m/s.   02/11/22: 0.65 m/s.    03/04/22: 0.65 m/s fast speed   03/18/22: 0.90 m/s fast speed.   04/15/22: 0.90 m/s Goal status: IN PROGRESS   4.  Patient will increase six minute walk test distance to >1000 for progression to community ambulator and improve gait ability. Baseline: 01/14/22: 280 feet.  02/11/22: 600 feet.   03/04/22: 735 feet.   03/18/22: 840 feet.   04/15/22:  840 feet.  Goal status: IN PROGRESS   5.  Patient will increase LLE gross strength to 4/5 as to improve functional strength for independent gait, increased standing tolerance and increased ADL ability. Baseline: 01/07/22: ranging 3-4/5, please see objective.   02/11/22: 4 to 4+/5 MMTs, see above.  Goal status: ACHIEVED         PLAN: PT FREQUENCY: 2x/week   PT DURATION: 4-5 weeks   PLANNED INTERVENTIONS: Therapeutic exercises, Therapeutic activity, Neuromuscular re-education, Balance training, Gait training, Patient/Family education, Joint mobilization, Stair training, DME instructions, Dry Needling, Cryotherapy, Moist heat, and Manual therapy.   PLAN FOR NEXT SESSION: LE strengthening, trunk stabilization, LE flexibility. Progress  with increasing emphasis on standing/weightbearing activity and continued AD wean as able from [facility] to no AD. Modalities and manual therapy for pain modulation as needed. Continue PT with increasing exercise intensity as able with careful CGA/SBA to prevent fall. Recommend continued PT 2x/week for 4-5 weeks     Valentina Gu, PT, DPT #[id number]  Eilleen Kempf, PT 11:22 AM 04/15/2022

## 2022-04-17 ENCOUNTER — Ambulatory Visit: Payer: Medicare Other | Admitting: Physical Therapy

## 2022-04-17 ENCOUNTER — Encounter: Payer: Self-pay | Admitting: Physical Therapy

## 2022-04-17 DIAGNOSIS — M6281 Muscle weakness (generalized): Secondary | ICD-10-CM

## 2022-04-17 DIAGNOSIS — R269 Unspecified abnormalities of gait and mobility: Secondary | ICD-10-CM

## 2022-04-17 DIAGNOSIS — Z981 Arthrodesis status: Secondary | ICD-10-CM

## 2022-04-17 DIAGNOSIS — R2681 Unsteadiness on feet: Secondary | ICD-10-CM

## 2022-04-17 DIAGNOSIS — M545 Low back pain, unspecified: Secondary | ICD-10-CM

## 2022-04-17 NOTE — Therapy (Signed)
OUTPATIENT PHYSICAL THERAPY TREATMENT NOTE   Patient Name: Justin Nixon MRN: [id number] DOB:1976/06/15, 46 y.o., male Today's Date: 04/17/2022   END OF SESSION:   PT End of Session - 04/17/22 1106     Visit Number 29    Number of Visits 36    Date for PT Re-Evaluation 04/24/22    Progress Note Due on Visit 28    PT Start Time 1104    PT Stop Time 1148    PT Time Calculation (min) 44 min    Equipment Utilized During Treatment Gait belt    Activity Tolerance Patient limited by fatigue;Patient limited by pain    Behavior During Therapy WFL for tasks assessed/performed              Past Medical History:  Diagnosis Date   Anxiety    Arthritis    GERD (gastroesophageal reflux disease)    H/O   Hyperlipidemia    Schizophrenia ([place])    Past Surgical History:  Procedure Laterality Date   ANTERIOR LATERAL LUMBAR FUSION WITH PERCUTANEOUS SCREW 1 LEVEL N/A 11/18/2021   Procedure: L4-5 LATERAL LUMBAR INTERBODY FUSION WITH POSTERIOR SPINAL FUSION;  Surgeon: Meade Maw, MD;  Location: [facility];  Service: Neurosurgery;  Laterality: N/A;   APPLICATION OF INTRAOPERATIVE CT SCAN N/A 11/18/2021   Procedure: APPLICATION OF INTRAOPERATIVE CT SCAN;  Surgeon: Meade Maw, MD;  Location: [facility];  Service: Neurosurgery;  Laterality: N/A;   COLONOSCOPY     CYST EXCISION     ELBOW ARTHROSCOPY Left 10/10/2020   Procedure: LEFT ELBOW ARTHROSCOPY WITH DEBRIDEMENT AND EXCISION OF LOOSE BODIES;  Surgeon: Corky Mull, MD;  Location: [facility];  Service: Orthopedics;  Laterality: Left;   MYRINGOTOMY WITH TUBE PLACEMENT     ORIF ELBOW FRACTURE Left 08/28/2021   Procedure: OPEN REDUCTION INTERNAL FIXATION (ORIF) ELBOW/OLECRANON FRACTURE;  Surgeon: Corky Mull, MD;  Location: [facility];  Service: Orthopedics;  Laterality: Left;   SHOULDER ARTHROSCOPY Left 03/18/2017   Procedure: ARTHROSCOPY SHOULDER with debridement decompression and distal clavicle excision;  Surgeon: Corky Mull, MD;   Location: [place];  Service: Orthopedics;  Laterality: Left;   SHOULDER ARTHROSCOPY WITH OPEN ROTATOR CUFF REPAIR Right 09/23/2016   Procedure: SHOULDER ARTHROSCOPY WITH OPEN ROTATOR CUFF REPAIR;  Surgeon: Corky Mull, MD;  Location: [facility];  Service: Orthopedics;  Laterality: Right;   SHOULDER ARTHROSCOPY WITH SUBACROMIAL DECOMPRESSION Right 09/23/2016   Procedure: SHOULDER ARTHROSCOPY WITH SUBACROMIAL DECOMPRESSION AND DEBRIDEMENT;  Surgeon: Corky Mull, MD;  Location: [facility];  Service: Orthopedics;  Laterality: Right;   Patient Active Problem List   Diagnosis Date Noted   S/P spinal fusion 11/18/2021   Olecranon fracture, left, open type I or II, initial encounter 08/28/2021      PCP: Baxter Hire MD REFERRING PROVIDER: Meade Maw, MD     REFERRING DIAG: Z98.1 (ICD-10-CM) - History of lumbar fusion   THERAPY DIAG:  Muscle weakness (generalized)   S/P lumbar fusion   Abnormality of gait and mobility   Unsteadiness on feet   Rationale for Evaluation and Treatment Rehabilitation   PERTINENT HISTORY: Pt presents to physical therapy for evaluation of low back and LE pain and weakness L>R, following L4-L5 XLIF/PSF on 11/18/21. Pt states he was using a cane before surgery due to onset of LLE weakness. States he has now been using a RW since surgery. Pain ranges from a 5/10 to a 10/10. When aggravated pain is described as sharp and shooting  into LLE; at baseline, pain is achy. Wearing clothes such as jeans is painful due to sensitivity of skin; at home wears pajamas. Pt has not returned to driving; he is limited to household distance ambulation or maximum into a doctors office. He feels as though his LLE is going to "give out" when he is walking or weightbearing. 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 Reports frequent falling since surgery due to LLE  giving out; no injuries associated with falls.    PRECAUTIONS: None at this time following 03/13/22 visit with Dr. Izora Ribas     OBJECTIVE: (objective measures completed at initial evaluation unless otherwise dated)   DIAGNOSTIC FINDINGS:  "Intraoperative images during L4-L5 anterior and posterior fusion. No evidence of immediate complication"   PATIENT SURVEYS:  Modified Oswestry (updated 01/09/22 due to incomplete form): 62%.   02/11/22: 48%.  03/04/22: 28% FOTO: eval 23/41,   02/11/22: 44/41   SCREENING FOR RED FLAGS: Bowel or bladder incontinence: No Spinal tumors: No Cauda equina syndrome: No Compression fracture: No Abdominal aneurysm: No   COGNITION:           Overall cognitive status: Within functional limits for tasks assessed                          SENSATION: Light touch: Impaired  - increased sensitivity in left L2 and L3 dermatomes    MUSCLE LENGTH: Hamstrings: unable to test due to increase in sharp/shooting pain in hip flexors prior to HS stretch being felt.  (01/28/22): R lacking 30 deg, L lacking 40 deg.   Thomas test: NT   POSTURE: decreased lumbar lordosis and posterior pelvic tilt   PALPATION: Right: hip flexors and lateral hip, glute med, piriformis, deep external rotators, QL Left: hip flexors, lateral hip and adductors (from proximal hip to mid-thigh), proximal hamstrings, L4-distal paraspinals, QL, all gluteals   OBSERVATION 01/21/22: No bruising, ecchymosis, increased tissue temperature, or erythema observed along L flank, L gluteal region, or adjacent to surgical incisions. No sign of infection or delayed healing   LUMBAR ROM: not assessed due to back precautions    Active  A/PROM  eval  Flexion -  Extension -  Right lateral flexion -  Left lateral flexion -  Right rotation -  Left rotation -   (Blank rows = not tested)   LOWER EXTREMITY ROM:      Passive  Right eval Left eval  Hip flexion 100 90  Hip extension NT NT  Hip abduction 20 20   Hip adduction      Hip internal rotation      Hip external rotation      Knee flexion [facility]  Knee extension [facility]  Ankle dorsiflexion [facility]  Ankle plantarflexion      Ankle inversion      Ankle eversion       (Blank rows = not tested)   LOWER EXTREMITY MMT:     MMT Right eval Left eval Right 02/11/22 Left 02/11/22  Hip flexion 4-* 4-* 4+ 4*  Hip extension          Hip abduction 4+* 4-* 4+ 4+  Hip adduction 4* 4* 4+ 4+  Hip internal rotation 5 3* 5 4*  Hip external rotation 5 4* 5 4*  Knee flexion 5 4* 5 4+  Knee extension 5 4+ 5 4  Ankle  dorsiflexion 4+ 3+ 5 4  Ankle plantarflexion           (Blank rows = not tested)     FUNCTIONAL TESTS:  5 times sit to stand: 43.37 seconds using arms.    02/11/22: 36.1 seconds.  03/04/22: 27.4 seconds.  6 minute walk test (performed 01/14/22): 280 feet.     02/11/22: 600 feet.  03/04/22: 735 feet. 10 meter walk test: 24.3 seconds (comfortable) = 0.41 m/s using RW; terminated fast-pace for safety.   02/11/22: 0.64 m/s comfortable speed, 0.65 m/s fast speed.  03/04/22: 0.65 m/s fast speed   STS mechanics: no UE support, use of momentum with rapid hip hinge to initiate sit to stand. Intermittent LLE buckling and varus/valgus immediately following initiation of sit to stand             -03/04/22: After 2 attempts pt able to perform sit to stand with no UE support, upward velocity of transfers slows down halfway through transfer with intermittent LLE varus/valgus   GAIT: 03/04/22: Distance walked: 735 feet Assistive device utilized: [facility] Level of assistance: standby assist Comments: good heel to toe pattern, intermittent LLE buckling requiring pt holding wall and intermittently placing hands on his L thigh for support; no assist required from PT to prevent fall     Injury screen 04/08/22 Homan's sign: Negative. Ankle inversion and eversion stress: Negative Dorsiflexion-eversion test: Negative  No significant color change, erythema, ecchymosis,  venous distension, or girth asymmetry along calf between R and LLE. Pt has non-pitting edema along distal tibia medially       TODAY'S TREATMENT    SUBJECTIVE: Patient reports no significant pain at arrival to PT in his low back. Pt reports notable pain affecting his upper back that is worse when first getting up. Patient reports experiencing this over last 2 weeks. Patient reports intermittent ("off and on") sensitivity affecting L hip and thigh - this gets up to 2/10. He reports repeated episode of L ankle swelling. No noted color change or foot pain. He reports intermittent cramping in L foot.    PAIN:  Are you having pain? 2/10  Pain location: pain in L flank of lumbar spine at arrival     -Capillary refill WNL for bilateral feet   -Mild L lateral ankle edema noted today upon visual inspection  -No significant integumentary changes consistent with arterial or venous disease    Therapeutic Exercise - for improved soft tissue flexibility and extensibility as needed for ROM, improved strength as needed to improve performance of CKC activities/functional movements and prevention of fall during episode of LOB, progressive standing/weightbearing activity to improve capacity for community-level mobility with decreasing reliance on AD/external support   SciFit seated elliptical; Level 6.5; seat at 10, 5 minutes - subjective information gathered during this time   Total Gym; Level 22; L single-leg minisquat; 1x10, 1x4, 1x6  (completed "drop set" strategy for 3 sets due to L quad fatigability)      Forward step up to stap tap on 2nd step; 2x8; staircase in center of gym  Sit to stand from standard chair; with Goblet hold c 8-lb Medball; 2x8   In hallway: Gait with ball toss from PT, wall bounce with forward/backward stepping and forward toss with side steps; x2 D/B each    // bars: Forward lunge; 2x6, careful CGA with PT due to intermittent LLE buckling Hurdle steps, consecutive;  mixed height hurdles, 3 6-inch and 2 12-inch hurdles; 5x D/B forward stepping AND lateral Unipedal  stance; 1x20 sec and 1x30 sec RLE, 2x10 sec LLE (Adjacent to single parallel bar, outside of bars) Alternating arm/leg with one second isometric hold on blue physioball; 2x10 alternating         *next visit*  3-way Hip with Green Tband at ankles;1x8 ea dir each LE    *not today* Lower trunk rotations in hooklying; 2x10 alternating R/L Supine piriformis stretch, dynamic; 1 sec on, 1 sec off; x20 in figure-4 position for LLE Figure-4 push; x20, 1 sec with LLE                         Standing quadriceps stretch; 1x60sec Tandem walk on long Airex pad; 4x D/B Forward step up to 6-inch step, staircase in center of clinic ; 2x10 with LLE Ambulate laps in gym x 3 with SPC in R hand  LLE sciatic flossing; x20, 1 sec; in hooklying  Standing quad stretch; 2x30 sec, in standing, stance on R foot, R hand holding onto table               Straight leg raise, supine in low mat; 2x10 Forward/backward stepping with therapist guarding patient posteriorly in case of LLE buckling, x 2 D/B 70-ft course Adjacent to single parallel bar, seated on blue physioball, alternating shoulder and hip flexion (opposite sides) with upright posture; 2x10 alternating Bird dog; quadruped; 1x8 alternating Dynamic forward march with contralateral knee touch; 4x D/B length of bars Sidestep with minimal upper limb support, careful guarding with intermittent LLE buckling; x4 D/B 30-ft course Supine dead bug with alternating knee extension/shoulder flexion; 2x10 alternating 3 6-inch hurdles consecutive step-over; step-to pattern x 2 D/B, reciprocal pattern x2 D/B Consecutive forward step-up to Airex pad, 3 Airex pads in // bars; 4x D/B                 Piriformis stretch; 20x, 1 sec Active hamstring stretch (glider technique): 2x10 on each LE Gait with SPC following demo from PT; ambulate around gym x 1 lap with VC for cane/foot  sequencing NuStep, seat at 10 and arms at 11; Level 3 resistance; 5 minutes  Dynamic march 3x D/B length of // bars Standing march with bilateral light touch on single parallel bar for complete weight shift to each lower limb; x10 alternating Seated hip abduction with Grn Tband, with abdominal brace and upright posture/neutral spine; 2x10 Abdominal brace with Bridge c neutral spine,  full range; 2x10 Abdominal brace with alternating march and shoulder flexion; 2x10 alternating R/L Standing toe tap onto 6-inch step; 2x10 alternating R/L for weight shifting and weight acceptance onto L lower limb Standing heel raise; 2x10 with bilat support on front-wheeled walker Abdominal brace/TrA contraction with alternating adductor ball squeeze/hip ABD iso against belt; 2x10, 5 sec  6-minute walk with front-wheel walker Abdominal brace/TrA contraction; x10, 5 sec Glute set; 2x10, 5 sec           HOME EXERCISE PROGRAM: Access Code [email or web address]       ASSESSMENT:   CLINICAL IMPRESSION: Continuing with advanced strengthening and stepping/weightbearing drills to maximize function and LLE closed-chain strength. Patient does have intermittent L ankle edema without notable trauma that does improve with compressive garments; recommended discussing this with his PCP given re-occurring episodes without specific etiology (no history of vascular disease and no trauma). Pt does have intermittent episodes of L knee buckling, and pt needs continued work on quadriceps and anti-gravity mm strengthening to prevent buckling and future falls. Pt has  remaining deficits in LLE strength, LLE postural control, gait stability, and intermittent LLE buckling with prolonged ambulatory/standing activity. Pt will benefit from skilled physical therapy to address the noted deficits in strength, weakness, and community-level mobility in order to increase safety with functional activities and allow pt to return to work and home duties.       OBJECTIVE IMPAIRMENTS Abnormal gait, decreased activity tolerance, decreased balance, decreased endurance, decreased mobility, difficulty walking, decreased ROM, decreased strength, decreased safety awareness, impaired flexibility, impaired sensation, postural dysfunction, and pain.    ACTIVITY LIMITATIONS carrying, lifting, bending, sitting, standing, squatting, stairs, and transfers   PARTICIPATION LIMITATIONS: cleaning, laundry, driving, shopping, community activity, occupation, and yard work   PERSONAL FACTORS Time since onset of injury/illness/exacerbation and 1-2 comorbidities: schizophrenia and anxiety  are also affecting patient's functional outcome.    REHAB POTENTIAL: Good   CLINICAL DECISION MAKING: Evolving/moderate complexity   EVALUATION COMPLEXITY: Moderate     GOALS: Goals reviewed with patient? Yes   SHORT TERM GOALS: Target date: 01/21/2022   Patient will be independent in home exercise program to improve strength/mobility for better functional independence with ADLs. Baseline:   02/11/22: Pt is compliant with his HEP  Goal status: ACHIEVED   2.  Pt will ambulate 200+ feet using RW without LOB or LLE buckling to demonstrate progression towards improved ambulatory endurance.  Baseline: 171ft .    02/11/22: Pt able to ambulate > 200 feet without LLE buckling Goal status: ACHIEVED   3.   Patient will deny any falls over past 4 weeks to demonstrate improved safety at home.  Baseline: 4 falls since surgery.   02/11/22: 2 falls in June.   03/04/22: No falls in previous month.  03/25/22: Pt last had fall in home 03/22/22.  04/15/22: One fall in home about 3.5 weeks ago.   Goal status: NOT MET      LONG TERM GOALS: Target date: 03/04/2022   Patient will increase FOTO score to equal to or greater than 41 to demonstrate statistically significant improvement in mobility and quality of life.  Baseline: 01/07/22: 23/41.  7/11/2: 44/41.  04/15/22: 52/41 Goal status: ACHIEVED   2.    Patient (< 8 years old) will complete five times sit to stand test in < 10 seconds indicating an increased LE strength and improved balance. Baseline: 01/07/22: 43 seconds.   02/11/22: 36.1 sec.  03/04/22: 27.4 seconds.    03/18/22: 21.6 seconds.  04/15/22: 16 seconds.  Goal status: IN PROGRESS   3.  Patient will increase 10 meter walk test to >1.30m/s as to improve gait speed for better community ambulation and to reduce fall risk. Baseline: 01/07/22: 0.41 m/s.   02/11/22: 0.65 m/s.    03/04/22: 0.65 m/s fast speed   03/18/22: 0.90 m/s fast speed.   04/15/22: 0.90 m/s Goal status: IN PROGRESS   4.  Patient will increase six minute walk test distance to >1000 for progression to community ambulator and improve gait ability. Baseline: 01/14/22: 280 feet.  02/11/22: 600 feet.   03/04/22: 735 feet.   03/18/22: 840 feet.   04/15/22: 840 feet.  Goal status: IN PROGRESS   5.  Patient will increase LLE gross strength to 4/5 as to improve functional strength for independent gait, increased standing tolerance and increased ADL ability. Baseline: 01/07/22: ranging 3-4/5, please see objective.   02/11/22: 4 to 4+/5 MMTs, see above.  Goal status: ACHIEVED         PLAN: PT FREQUENCY: 2x/week   PT  DURATION: 4-5 weeks   PLANNED INTERVENTIONS: Therapeutic exercises, Therapeutic activity, Neuromuscular re-education, Balance training, Gait training, Patient/Family education, Joint mobilization, Stair training, DME instructions, Dry Needling, Cryotherapy, Moist heat, and Manual therapy.   PLAN FOR NEXT SESSION: LE strengthening, trunk stabilization, LE flexibility. Progress with increasing emphasis on standing/weightbearing activity and continued AD wean as able from [facility] to no AD. Modalities and manual therapy for pain modulation as needed. Continue PT with increasing exercise intensity as able with careful CGA/SBA to prevent fall. Recommend continued PT 2x/week for 4-5 weeks     Valentina Gu, PT, DPT #[id number]  Eilleen Kempf, PT 11:06 AM 04/17/2022

## 2022-04-22 ENCOUNTER — Ambulatory Visit: Payer: Medicare Other | Admitting: Physical Therapy

## 2022-04-22 DIAGNOSIS — R2681 Unsteadiness on feet: Secondary | ICD-10-CM

## 2022-04-22 DIAGNOSIS — M545 Low back pain, unspecified: Secondary | ICD-10-CM

## 2022-04-22 DIAGNOSIS — M6281 Muscle weakness (generalized): Secondary | ICD-10-CM

## 2022-04-22 DIAGNOSIS — R269 Unspecified abnormalities of gait and mobility: Secondary | ICD-10-CM

## 2022-04-22 DIAGNOSIS — Z981 Arthrodesis status: Secondary | ICD-10-CM

## 2022-04-22 NOTE — Therapy (Signed)
OUTPATIENT PHYSICAL THERAPY TREATMENT NOTE   Patient Name: Justin Nixon MRN: [id number] DOB:Dec 20, 1975, 46 y.o., male Today's Date: 04/22/2022   END OF SESSION:      Past Medical History:  Diagnosis Date   Anxiety    Arthritis    GERD (gastroesophageal reflux disease)    H/O   Hyperlipidemia    Schizophrenia (Advance)    Past Surgical History:  Procedure Laterality Date   ANTERIOR LATERAL LUMBAR FUSION WITH PERCUTANEOUS SCREW 1 LEVEL N/A 11/18/2021   Procedure: L4-5 LATERAL LUMBAR INTERBODY FUSION WITH POSTERIOR SPINAL FUSION;  Surgeon: Meade Maw, MD;  Location: [facility];  Service: Neurosurgery;  Laterality: N/A;   APPLICATION OF INTRAOPERATIVE CT SCAN N/A 11/18/2021   Procedure: APPLICATION OF INTRAOPERATIVE CT SCAN;  Surgeon: Meade Maw, MD;  Location: [facility];  Service: Neurosurgery;  Laterality: N/A;   COLONOSCOPY     CYST EXCISION     ELBOW ARTHROSCOPY Left 10/10/2020   Procedure: LEFT ELBOW ARTHROSCOPY WITH DEBRIDEMENT AND EXCISION OF LOOSE BODIES;  Surgeon: Corky Mull, MD;  Location: [facility];  Service: Orthopedics;  Laterality: Left;   MYRINGOTOMY WITH TUBE PLACEMENT     ORIF ELBOW FRACTURE Left 08/28/2021   Procedure: OPEN REDUCTION INTERNAL FIXATION (ORIF) ELBOW/OLECRANON FRACTURE;  Surgeon: Corky Mull, MD;  Location: [facility];  Service: Orthopedics;  Laterality: Left;   SHOULDER ARTHROSCOPY Left 03/18/2017   Procedure: ARTHROSCOPY SHOULDER with debridement decompression and distal clavicle excision;  Surgeon: Corky Mull, MD;  Location: [place];  Service: Orthopedics;  Laterality: Left;   SHOULDER ARTHROSCOPY WITH OPEN ROTATOR CUFF REPAIR Right 09/23/2016   Procedure: SHOULDER ARTHROSCOPY WITH OPEN ROTATOR CUFF REPAIR;  Surgeon: Corky Mull, MD;  Location: [facility];  Service: Orthopedics;  Laterality: Right;   SHOULDER ARTHROSCOPY WITH SUBACROMIAL DECOMPRESSION Right 09/23/2016   Procedure: SHOULDER ARTHROSCOPY WITH SUBACROMIAL  DECOMPRESSION AND DEBRIDEMENT;  Surgeon: Corky Mull, MD;  Location: [facility];  Service: Orthopedics;  Laterality: Right;   Patient Active Problem List   Diagnosis Date Noted   S/P spinal fusion 11/18/2021   Olecranon fracture, left, open type I or II, initial encounter 08/28/2021      PCP: Baxter Hire MD REFERRING PROVIDER: Meade Maw, MD     REFERRING DIAG: Z98.1 (ICD-10-CM) - History of lumbar fusion   THERAPY DIAG:  Muscle weakness (generalized)   S/P lumbar fusion   Abnormality of gait and mobility   Unsteadiness on feet   Rationale for Evaluation and Treatment Rehabilitation   PERTINENT HISTORY: Pt presents to physical therapy for evaluation of low back and LE pain and weakness L>R, following L4-L5 XLIF/PSF on 11/18/21. Pt states he was using a cane before surgery due to onset of LLE weakness. States he has now been using a RW since surgery. Pain ranges from a 5/10 to a 10/10. When aggravated pain is described as sharp and shooting into LLE; at baseline, pain is achy. Wearing clothes such as jeans is painful due to sensitivity of skin; at home wears pajamas. Pt has not returned to driving; he is limited to household distance ambulation or maximum into a doctors office. He feels as though his LLE is going to "give out" when he is walking or weightbearing. 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 Reports frequent falling since surgery due to LLE giving out; no injuries associated with falls.    PRECAUTIONS: None at  this time following 03/13/22 visit with Dr. Izora Ribas     OBJECTIVE: (objective measures completed at initial evaluation unless otherwise dated)   DIAGNOSTIC FINDINGS:  "Intraoperative images during L4-L5 anterior and posterior fusion. No evidence of immediate complication"   PATIENT SURVEYS:  Modified Oswestry (updated 01/09/22 due to incomplete  form): 62%.   02/11/22: 48%.  03/04/22: 28% FOTO: eval 23/41,   02/11/22: 44/41   SCREENING FOR RED FLAGS: Bowel or bladder incontinence: No Spinal tumors: No Cauda equina syndrome: No Compression fracture: No Abdominal aneurysm: No   COGNITION:           Overall cognitive status: Within functional limits for tasks assessed                          SENSATION: Light touch: Impaired  - increased sensitivity in left L2 and L3 dermatomes    MUSCLE LENGTH: Hamstrings: unable to test due to increase in sharp/shooting pain in hip flexors prior to HS stretch being felt.  (01/28/22): R lacking 30 deg, L lacking 40 deg.   Thomas test: NT   POSTURE: decreased lumbar lordosis and posterior pelvic tilt   PALPATION: Right: hip flexors and lateral hip, glute med, piriformis, deep external rotators, QL Left: hip flexors, lateral hip and adductors (from proximal hip to mid-thigh), proximal hamstrings, L4-distal paraspinals, QL, all gluteals   OBSERVATION 01/21/22: No bruising, ecchymosis, increased tissue temperature, or erythema observed along L flank, L gluteal region, or adjacent to surgical incisions. No sign of infection or delayed healing   LUMBAR ROM: not assessed due to back precautions    Active  A/PROM  eval  Flexion -  Extension -  Right lateral flexion -  Left lateral flexion -  Right rotation -  Left rotation -   (Blank rows = not tested)   LOWER EXTREMITY ROM:      Passive  Right eval Left eval  Hip flexion 100 90  Hip extension NT NT  Hip abduction 20 20  Hip adduction      Hip internal rotation      Hip external rotation      Knee flexion [facility]  Knee extension [facility]  Ankle dorsiflexion [facility]  Ankle plantarflexion      Ankle inversion      Ankle eversion       (Blank rows = not tested)   LOWER EXTREMITY MMT:     MMT Right eval Left eval Right 02/11/22 Left 02/11/22  Hip flexion 4-* 4-* 4+ 4*  Hip extension          Hip abduction 4+* 4-* 4+ 4+  Hip  adduction 4* 4* 4+ 4+  Hip internal rotation 5 3* 5 4*  Hip external rotation 5 4* 5 4*  Knee flexion 5 4* 5 4+  Knee extension 5 4+ 5 4  Ankle dorsiflexion 4+ 3+ 5 4  Ankle plantarflexion           (Blank rows = not tested)     FUNCTIONAL TESTS:  5 times sit to stand: 43.37 seconds using arms.    02/11/22: 36.1 seconds.  03/04/22: 27.4 seconds.  6 minute walk test (performed 01/14/22): 280 feet.     02/11/22: 600 feet.  03/04/22: 735 feet. 10 meter walk test: 24.3 seconds (comfortable) = 0.41 m/s using RW; terminated fast-pace for safety.   02/11/22: 0.64 m/s comfortable speed, 0.65 m/s fast speed.  03/04/22: 0.65 m/s fast speed  STS mechanics: no UE support, use of momentum with rapid hip hinge to initiate sit to stand. Intermittent LLE buckling and varus/valgus immediately following initiation of sit to stand             -03/04/22: After 2 attempts pt able to perform sit to stand with no UE support, upward velocity of transfers slows down halfway through transfer with intermittent LLE varus/valgus   GAIT: 03/04/22: Distance walked: 735 feet Assistive device utilized: [facility] Level of assistance: standby assist Comments: good heel to toe pattern, intermittent LLE buckling requiring pt holding wall and intermittently placing hands on his L thigh for support; no assist required from PT to prevent fall     Injury screen 04/08/22 Homan's sign: Negative. Ankle inversion and eversion stress: Negative Dorsiflexion-eversion test: Negative  No significant color change, erythema, ecchymosis, venous distension, or girth asymmetry along calf between R and LLE. Pt has non-pitting edema along distal tibia medially       TODAY'S TREATMENT    SUBJECTIVE: Patient reports no pain at arrival. He reports that his L ankle swelling was better by Sunday. He reports intermittent sensitivity affecting L thigh with occasional feeling of "lightening strike" in this region.    PAIN:  Are you having pain? No Pain  location: -    -Capillary refill WNL for bilateral feet   -Mild L lateral ankle edema noted today upon visual inspection  -No significant integumentary changes consistent with arterial or venous disease    Therapeutic Exercise - for improved soft tissue flexibility and extensibility as needed for ROM, improved strength as needed to improve performance of CKC activities/functional movements and prevention of fall during episode of LOB, progressive standing/weightbearing activity to improve capacity for community-level mobility with decreasing reliance on AD/external support   SciFit seated elliptical; Level 7; seat at 10, 5 minutes - subjective information gathered during this time   Total Gym; Level 22; L single-leg minisquat; 1x8, 1x7, 1x6  (completed "drop set" strategy for 3 sets due to L quad fatigability)      Forward step up to stap tap on 2nd step; 2x8; staircase in center of gym   In hallway: Gait with ball toss from PT, wall bounce with forward/backward stepping and floor bounce with forward/backward stepping;    // bars: Forward step up to 6-inch step + Airex; 2x5 Forward lunge and lateral lunge; 1x10, careful CGA with PT due to intermittent LLE buckling Hurdle steps, consecutive; mixed height hurdles, 3 6-inch and 2 12-inch hurdles; 5x D/B forward stepping AND lateral Unipedal stance; 1x20 sec and 1x30 sec RLE, 2x10 sec LLE (Adjacent to single parallel bar, outside of bars) Alternating arm/leg with one second isometric hold on blue physioball; 2x10 alternating         *next visit*  3-way Hip with Green Tband at ankles;1x8 ea dir each LE    *not today* Sit to stand from standard chair; with Goblet hold c 8-lb Medball; 2x8 Lower trunk rotations in hooklying; 2x10 alternating R/L Supine piriformis stretch, dynamic; 1 sec on, 1 sec off; x20 in figure-4 position for LLE Figure-4 push; x20, 1 sec with LLE                         Standing quadriceps stretch;  1x60sec Tandem walk on long Airex pad; 4x D/B Forward step up to 6-inch step, staircase in center of clinic ; 2x10 with LLE Ambulate laps in gym x 3 with SPC in R  hand  LLE sciatic flossing; x20, 1 sec; in hooklying  Standing quad stretch; 2x30 sec, in standing, stance on R foot, R hand holding onto table               Straight leg raise, supine in low mat; 2x10 Forward/backward stepping with therapist guarding patient posteriorly in case of LLE buckling, x 2 D/B 70-ft course Adjacent to single parallel bar, seated on blue physioball, alternating shoulder and hip flexion (opposite sides) with upright posture; 2x10 alternating Bird dog; quadruped; 1x8 alternating Dynamic forward march with contralateral knee touch; 4x D/B length of bars Sidestep with minimal upper limb support, careful guarding with intermittent LLE buckling; x4 D/B 30-ft course Supine dead bug with alternating knee extension/shoulder flexion; 2x10 alternating 3 6-inch hurdles consecutive step-over; step-to pattern x 2 D/B, reciprocal pattern x2 D/B Consecutive forward step-up to Airex pad, 3 Airex pads in // bars; 4x D/B                 Piriformis stretch; 20x, 1 sec Active hamstring stretch (glider technique): 2x10 on each LE Gait with SPC following demo from PT; ambulate around gym x 1 lap with VC for cane/foot sequencing NuStep, seat at 10 and arms at 11; Level 3 resistance; 5 minutes  Dynamic march 3x D/B length of // bars Standing march with bilateral light touch on single parallel bar for complete weight shift to each lower limb; x10 alternating Seated hip abduction with Grn Tband, with abdominal brace and upright posture/neutral spine; 2x10 Abdominal brace with Bridge c neutral spine,  full range; 2x10 Abdominal brace with alternating march and shoulder flexion; 2x10 alternating R/L Standing toe tap onto 6-inch step; 2x10 alternating R/L for weight shifting and weight acceptance onto L lower limb Standing heel raise;  2x10 with bilat support on front-wheeled walker Abdominal brace/TrA contraction with alternating adductor ball squeeze/hip ABD iso against belt; 2x10, 5 sec  6-minute walk with front-wheel walker Abdominal brace/TrA contraction; x10, 5 sec Glute set; 2x10, 5 sec           HOME EXERCISE PROGRAM: Access Code [email or web address]       ASSESSMENT:   CLINICAL IMPRESSION: Continuing with advanced strengthening and stepping/weightbearing drills to maximize function and LLE closed-chain strength. Patient does have intermittent L ankle edema without notable trauma that does improve with compressive garments; recommended discussing this with his PCP given re-occurring episodes without specific etiology (no history of vascular disease and no trauma). Pt does have intermittent episodes of L knee buckling, and pt needs continued work on quadriceps and anti-gravity mm strengthening to prevent buckling and future falls. Pt has remaining deficits in LLE strength, LLE postural control, gait stability, and intermittent LLE buckling with prolonged ambulatory/standing activity. Pt will benefit from skilled physical therapy to address the noted deficits in strength, weakness, and community-level mobility in order to increase safety with functional activities and allow pt to return to work and home duties.      OBJECTIVE IMPAIRMENTS Abnormal gait, decreased activity tolerance, decreased balance, decreased endurance, decreased mobility, difficulty walking, decreased ROM, decreased strength, decreased safety awareness, impaired flexibility, impaired sensation, postural dysfunction, and pain.    ACTIVITY LIMITATIONS carrying, lifting, bending, sitting, standing, squatting, stairs, and transfers   PARTICIPATION LIMITATIONS: cleaning, laundry, driving, shopping, community activity, occupation, and yard work   PERSONAL FACTORS Time since onset of injury/illness/exacerbation and 1-2 comorbidities: schizophrenia and anxiety  are  also affecting patient's functional outcome.    REHAB POTENTIAL: Good   CLINICAL  DECISION MAKING: Evolving/moderate complexity   EVALUATION COMPLEXITY: Moderate     GOALS: Goals reviewed with patient? Yes   SHORT TERM GOALS: Target date: 01/21/2022   Patient will be independent in home exercise program to improve strength/mobility for better functional independence with ADLs. Baseline:   02/11/22: Pt is compliant with his HEP  Goal status: ACHIEVED   2.  Pt will ambulate 200+ feet using RW without LOB or LLE buckling to demonstrate progression towards improved ambulatory endurance.  Baseline: 174ft .    02/11/22: Pt able to ambulate > 200 feet without LLE buckling Goal status: ACHIEVED   3.   Patient will deny any falls over past 4 weeks to demonstrate improved safety at home.  Baseline: 4 falls since surgery.   02/11/22: 2 falls in June.   03/04/22: No falls in previous month.  03/25/22: Pt last had fall in home 03/22/22.  04/15/22: One fall in home about 3.5 weeks ago.   Goal status: NOT MET      LONG TERM GOALS: Target date: 03/04/2022   Patient will increase FOTO score to equal to or greater than 41 to demonstrate statistically significant improvement in mobility and quality of life.  Baseline: 01/07/22: 23/41.  7/11/2: 44/41.  04/15/22: 52/41 Goal status: ACHIEVED   2.   Patient (< 46 years old) will complete five times sit to stand test in < 10 seconds indicating an increased LE strength and improved balance. Baseline: 01/07/22: 43 seconds.   02/11/22: 36.1 sec.  03/04/22: 27.4 seconds.    03/18/22: 21.6 seconds.  04/15/22: 16 seconds.  Goal status: IN PROGRESS   3.  Patient will increase 10 meter walk test to >1.33m/s as to improve gait speed for better community ambulation and to reduce fall risk. Baseline: 01/07/22: 0.41 m/s.   02/11/22: 0.65 m/s.    03/04/22: 0.65 m/s fast speed   03/18/22: 0.90 m/s fast speed.   04/15/22: 0.90 m/s Goal status: IN PROGRESS   4.  Patient will increase six minute  walk test distance to >1000 for progression to community ambulator and improve gait ability. Baseline: 01/14/22: 280 feet.  02/11/22: 600 feet.   03/04/22: 735 feet.   03/18/22: 840 feet.   04/15/22: 840 feet.  Goal status: IN PROGRESS   5.  Patient will increase LLE gross strength to 4/5 as to improve functional strength for independent gait, increased standing tolerance and increased ADL ability. Baseline: 01/07/22: ranging 3-4/5, please see objective.   02/11/22: 4 to 4+/5 MMTs, see above.  Goal status: ACHIEVED         PLAN: PT FREQUENCY: 2x/week   PT DURATION: 4-5 weeks   PLANNED INTERVENTIONS: Therapeutic exercises, Therapeutic activity, Neuromuscular re-education, Balance training, Gait training, Patient/Family education, Joint mobilization, Stair training, DME instructions, Dry Needling, Cryotherapy, Moist heat, and Manual therapy.   PLAN FOR NEXT SESSION: LE strengthening, trunk stabilization, LE flexibility. Progress with increasing emphasis on standing/weightbearing activity and continued AD wean as able from [facility] to no AD. Modalities and manual therapy for pain modulation as needed. Continue PT with increasing exercise intensity as able with careful CGA/SBA to prevent fall. Recommend continued PT 2x/week for 4-5 weeks     Valentina Gu, PT, DPT #[id number]  Eilleen Kempf, PT 11:19 AM 04/22/2022

## 2022-04-23 ENCOUNTER — Encounter: Payer: Self-pay | Admitting: Physical Therapy

## 2022-04-24 ENCOUNTER — Ambulatory Visit: Payer: Medicare Other | Admitting: Physical Therapy

## 2022-04-24 DIAGNOSIS — M545 Low back pain, unspecified: Secondary | ICD-10-CM

## 2022-04-24 DIAGNOSIS — M6281 Muscle weakness (generalized): Secondary | ICD-10-CM

## 2022-04-24 DIAGNOSIS — R2681 Unsteadiness on feet: Secondary | ICD-10-CM

## 2022-04-24 DIAGNOSIS — R269 Unspecified abnormalities of gait and mobility: Secondary | ICD-10-CM

## 2022-04-24 DIAGNOSIS — Z981 Arthrodesis status: Secondary | ICD-10-CM

## 2022-04-24 NOTE — Therapy (Signed)
OUTPATIENT PHYSICAL THERAPY TREATMENT     Patient Name: Justin Nixon MRN: [id number] DOB:1975-08-31, 46 y.o., male Today's Date: 04/24/2022   END OF SESSION:   PT End of Session - 04/26/22 0511     Visit Number 31    Number of Visits 36    Date for PT Re-Evaluation 04/24/22    Progress Note Due on Visit 57    PT Start Time 1115    PT Stop Time 1159    PT Time Calculation (min) 44 min    Equipment Utilized During Treatment Gait belt    Activity Tolerance Patient limited by fatigue;Patient limited by pain    Behavior During Therapy WFL for tasks assessed/performed                Past Medical History:  Diagnosis Date   Anxiety    Arthritis    GERD (gastroesophageal reflux disease)    H/O   Hyperlipidemia    Schizophrenia ([place])    Past Surgical History:  Procedure Laterality Date   ANTERIOR LATERAL LUMBAR FUSION WITH PERCUTANEOUS SCREW 1 LEVEL N/A 11/18/2021   Procedure: L4-5 LATERAL LUMBAR INTERBODY FUSION WITH POSTERIOR SPINAL FUSION;  Surgeon: Meade Maw, MD;  Location: [facility];  Service: Neurosurgery;  Laterality: N/A;   APPLICATION OF INTRAOPERATIVE CT SCAN N/A 11/18/2021   Procedure: APPLICATION OF INTRAOPERATIVE CT SCAN;  Surgeon: Meade Maw, MD;  Location: [facility];  Service: Neurosurgery;  Laterality: N/A;   COLONOSCOPY     CYST EXCISION     ELBOW ARTHROSCOPY Left 10/10/2020   Procedure: LEFT ELBOW ARTHROSCOPY WITH DEBRIDEMENT AND EXCISION OF LOOSE BODIES;  Surgeon: Corky Mull, MD;  Location: [facility];  Service: Orthopedics;  Laterality: Left;   MYRINGOTOMY WITH TUBE PLACEMENT     ORIF ELBOW FRACTURE Left 08/28/2021   Procedure: OPEN REDUCTION INTERNAL FIXATION (ORIF) ELBOW/OLECRANON FRACTURE;  Surgeon: Corky Mull, MD;  Location: [facility];  Service: Orthopedics;  Laterality: Left;   SHOULDER ARTHROSCOPY Left 03/18/2017   Procedure: ARTHROSCOPY SHOULDER with debridement decompression and distal clavicle excision;  Surgeon: Corky Mull,  MD;  Location: [place];  Service: Orthopedics;  Laterality: Left;   SHOULDER ARTHROSCOPY WITH OPEN ROTATOR CUFF REPAIR Right 09/23/2016   Procedure: SHOULDER ARTHROSCOPY WITH OPEN ROTATOR CUFF REPAIR;  Surgeon: Corky Mull, MD;  Location: [facility];  Service: Orthopedics;  Laterality: Right;   SHOULDER ARTHROSCOPY WITH SUBACROMIAL DECOMPRESSION Right 09/23/2016   Procedure: SHOULDER ARTHROSCOPY WITH SUBACROMIAL DECOMPRESSION AND DEBRIDEMENT;  Surgeon: Corky Mull, MD;  Location: [facility];  Service: Orthopedics;  Laterality: Right;   Patient Active Problem List   Diagnosis Date Noted   S/P spinal fusion 11/18/2021   Olecranon fracture, left, open type I or II, initial encounter 08/28/2021      PCP: Baxter Hire MD REFERRING PROVIDER: Meade Maw, MD     REFERRING DIAG: Z98.1 (ICD-10-CM) - History of lumbar fusion   THERAPY DIAG:  Muscle weakness (generalized)   S/P lumbar fusion   Abnormality of gait and mobility   Unsteadiness on feet   Rationale for Evaluation and Treatment Rehabilitation   PERTINENT HISTORY: Pt presents to physical therapy for evaluation of low back and LE pain and weakness L>R, following L4-L5 XLIF/PSF on 11/18/21. Pt states he was using a cane before surgery due to onset of LLE weakness. States he has now been using a RW since surgery. Pain ranges from a 5/10 to a 10/10. When aggravated pain is described as  sharp and shooting into LLE; at baseline, pain is achy. Wearing clothes such as jeans is painful due to sensitivity of skin; at home wears pajamas. Pt has not returned to driving; he is limited to household distance ambulation or maximum into a doctors office. He feels as though his LLE is going to "give out" when he is walking or weightbearing. 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 Reports frequent falling since surgery due to LLE  giving out; no injuries associated with falls.    PRECAUTIONS: None at this time following 03/13/22 visit with Dr. Izora Ribas     OBJECTIVE: (objective measures completed at initial evaluation unless otherwise dated)   DIAGNOSTIC FINDINGS:  "Intraoperative images during L4-L5 anterior and posterior fusion. No evidence of immediate complication"   PATIENT SURVEYS:  Modified Oswestry (updated 01/09/22 due to incomplete form): 62%.   02/11/22: 48%.  03/04/22: 28% FOTO: eval 23/41,   02/11/22: 44/41   SCREENING FOR RED FLAGS: Bowel or bladder incontinence: No Spinal tumors: No Cauda equina syndrome: No Compression fracture: No Abdominal aneurysm: No   COGNITION:           Overall cognitive status: Within functional limits for tasks assessed                          SENSATION: Light touch: Impaired  - increased sensitivity in left L2 and L3 dermatomes    MUSCLE LENGTH: Hamstrings: unable to test due to increase in sharp/shooting pain in hip flexors prior to HS stretch being felt.  (01/28/22): R lacking 30 deg, L lacking 40 deg.   Thomas test: NT   POSTURE: decreased lumbar lordosis and posterior pelvic tilt   PALPATION: Right: hip flexors and lateral hip, glute med, piriformis, deep external rotators, QL Left: hip flexors, lateral hip and adductors (from proximal hip to mid-thigh), proximal hamstrings, L4-distal paraspinals, QL, all gluteals   OBSERVATION 01/21/22: No bruising, ecchymosis, increased tissue temperature, or erythema observed along L flank, L gluteal region, or adjacent to surgical incisions. No sign of infection or delayed healing   LUMBAR ROM: not assessed due to back precautions    Active  A/PROM  eval  Flexion -  Extension -  Right lateral flexion -  Left lateral flexion -  Right rotation -  Left rotation -   (Blank rows = not tested)   LOWER EXTREMITY ROM:      Passive  Right eval Left eval  Hip flexion 100 90  Hip extension NT NT  Hip abduction 20 20   Hip adduction      Hip internal rotation      Hip external rotation      Knee flexion [facility]  Knee extension [facility]  Ankle dorsiflexion [facility]  Ankle plantarflexion      Ankle inversion      Ankle eversion       (Blank rows = not tested)   LOWER EXTREMITY MMT:     MMT Right eval Left eval Right 02/11/22 Left 02/11/22  Hip flexion 4-* 4-* 4+ 4*  Hip extension          Hip abduction 4+* 4-* 4+ 4+  Hip adduction 4* 4* 4+ 4+  Hip internal rotation 5 3* 5 4*  Hip external rotation 5 4* 5 4*  Knee flexion 5 4* 5 4+  Knee extension 5 4+ 5  4  Ankle dorsiflexion 4+ 3+ 5 4  Ankle plantarflexion           (Blank rows = not tested)     FUNCTIONAL TESTS:  5 times sit to stand: 43.37 seconds using arms.    02/11/22: 36.1 seconds.  03/04/22: 27.4 seconds.  6 minute walk test (performed 01/14/22): 280 feet.     02/11/22: 600 feet.  03/04/22: 735 feet. 10 meter walk test: 24.3 seconds (comfortable) = 0.41 m/s using RW; terminated fast-pace for safety.   02/11/22: 0.64 m/s comfortable speed, 0.65 m/s fast speed.  03/04/22: 0.65 m/s fast speed   STS mechanics: no UE support, use of momentum with rapid hip hinge to initiate sit to stand. Intermittent LLE buckling and varus/valgus immediately following initiation of sit to stand             -03/04/22: After 2 attempts pt able to perform sit to stand with no UE support, upward velocity of transfers slows down halfway through transfer with intermittent LLE varus/valgus   GAIT: 03/04/22: Distance walked: 735 feet Assistive device utilized: [facility] Level of assistance: standby assist Comments: good heel to toe pattern, intermittent LLE buckling requiring pt holding wall and intermittently placing hands on his L thigh for support; no assist required from PT to prevent fall     Injury screen 04/08/22 Homan's sign: Negative. Ankle inversion and eversion stress: Negative Dorsiflexion-eversion test: Negative  No significant color change, erythema, ecchymosis,  venous distension, or girth asymmetry along calf between R and LLE. Pt has non-pitting edema along distal tibia medially       TODAY'S TREATMENT    SUBJECTIVE: Patient reports no pain at arrival. He reports managing L ankle edema well with use of compression garment and elevation. He reports intermittent increase in swelling with standing/ambulatory activity. Pt has not yet discussed this issue with MD. Pt reports once region on anterior ankle at which he got abrasion from scratching which is healed now. No apparent ulcers or skin lesions otherwise.    PAIN:  Are you having pain? No Pain location: -       Therapeutic Exercise - for improved soft tissue flexibility and extensibility as needed for ROM, improved strength as needed to improve performance of CKC activities/functional movements and prevention of fall during episode of LOB, progressive standing/weightbearing activity to improve capacity for community-level mobility with decreasing reliance on AD/external support   SciFit seated elliptical; Level 7; seat at 10, 5 minutes - subjective information gathered during this time   Total Gym; Level 22; L single-leg minisquat; 1x10, 1x5, 1x7 (completed "drop set" strategy for 3 sets due to L quad fatigability)      Forward step up to stap tap on 2nd step; 2x12; staircase in center of gym   In hallway: Gait with ball toss from PT, wall bounce with forward/backward stepping x2 D/B and floor bounce with forward/backward stepping x 2D/B (utilized half of hallway length for floor bounce)    // bars: Forward step up to 6-inch step + Airex; 2x8 Forward lunge and lateral lunge; 1x10, careful CGA with PT due to intermittent LLE buckling Hurdle steps on Airex pad, (2) 12-inch hurdles and (1) 6-inch hurdle; 5x D/B   Adjacent to treadmill armrest: Unipedal stance; 1x20sec and 1x30 sec LLE, 2x30 sec RLE 3-way Hip with Green Tband at ankles;1x8 ea dir each LE    *not today* (Adjacent to  single parallel bar, outside of bars) Alternating arm/leg with one second isometric hold on  blue physioball; 2x10 alternating Sit to stand from standard chair; with Goblet hold c 8-lb Medball; 2x8 Lower trunk rotations in hooklying; 2x10 alternating R/L Supine piriformis stretch, dynamic; 1 sec on, 1 sec off; x20 in figure-4 position for LLE Figure-4 push; x20, 1 sec with LLE                         Standing quadriceps stretch; 1x60sec Tandem walk on long Airex pad; 4x D/B Forward step up to 6-inch step, staircase in center of clinic ; 2x10 with LLE Ambulate laps in gym x 3 with SPC in R hand  LLE sciatic flossing; x20, 1 sec; in hooklying  Standing quad stretch; 2x30 sec, in standing, stance on R foot, R hand holding onto table               Straight leg raise, supine in low mat; 2x10 Forward/backward stepping with therapist guarding patient posteriorly in case of LLE buckling, x 2 D/B 70-ft course Adjacent to single parallel bar, seated on blue physioball, alternating shoulder and hip flexion (opposite sides) with upright posture; 2x10 alternating Bird dog; quadruped; 1x8 alternating Dynamic forward march with contralateral knee touch; 4x D/B length of bars Sidestep with minimal upper limb support, careful guarding with intermittent LLE buckling; x4 D/B 30-ft course Supine dead bug with alternating knee extension/shoulder flexion; 2x10 alternating 3 6-inch hurdles consecutive step-over; step-to pattern x 2 D/B, reciprocal pattern x2 D/B Consecutive forward step-up to Airex pad, 3 Airex pads in // bars; 4x D/B                 Piriformis stretch; 20x, 1 sec Active hamstring stretch (glider technique): 2x10 on each LE Gait with SPC following demo from PT; ambulate around gym x 1 lap with VC for cane/foot sequencing NuStep, seat at 10 and arms at 11; Level 3 resistance; 5 minutes  Dynamic march 3x D/B length of // bars Standing march with bilateral light touch on single parallel bar for  complete weight shift to each lower limb; x10 alternating Seated hip abduction with Grn Tband, with abdominal brace and upright posture/neutral spine; 2x10 Abdominal brace with Bridge c neutral spine,  full range; 2x10 Abdominal brace with alternating march and shoulder flexion; 2x10 alternating R/L Standing toe tap onto 6-inch step; 2x10 alternating R/L for weight shifting and weight acceptance onto L lower limb Standing heel raise; 2x10 with bilat support on front-wheeled walker Abdominal brace/TrA contraction with alternating adductor ball squeeze/hip ABD iso against belt; 2x10, 5 sec  6-minute walk with front-wheel walker Abdominal brace/TrA contraction; x10, 5 sec Glute set; 2x10, 5 sec           HOME EXERCISE PROGRAM: Access Code [email or web address]       ASSESSMENT:   CLINICAL IMPRESSION: Patient is making continued progress with ability to complete higher volume of standing activity. He is able to progress volume of closed-chain drills focused on improving neural drive and anti-gravity muscle strength of L lower limb. Pt does have intermittent ankle edema that improves with compression and elevation; minimal edema noted at last two sessions and no concerning skin lesions. Recommended f/u with MD regarding intermittent edema given no Hx of vascular disease. PT is primarily focused on recovery of function now and improved ability to bear weight on LLE and continue progressive standing/walking activity. Pt has remaining deficits in LLE strength, LLE postural control, gait stability, and intermittent LLE buckling with prolonged ambulatory/standing activity. Pt will benefit  from skilled physical therapy to address the noted deficits in strength, weakness, and community-level mobility in order to increase safety with functional activities and allow pt to return to work and home duties.      OBJECTIVE IMPAIRMENTS Abnormal gait, decreased activity tolerance, decreased balance, decreased endurance,  decreased mobility, difficulty walking, decreased ROM, decreased strength, decreased safety awareness, impaired flexibility, impaired sensation, postural dysfunction, and pain.    ACTIVITY LIMITATIONS carrying, lifting, bending, sitting, standing, squatting, stairs, and transfers   PARTICIPATION LIMITATIONS: cleaning, laundry, driving, shopping, community activity, occupation, and yard work   PERSONAL FACTORS Time since onset of injury/illness/exacerbation and 1-2 comorbidities: schizophrenia and anxiety  are also affecting patient's functional outcome.    REHAB POTENTIAL: Good   CLINICAL DECISION MAKING: Evolving/moderate complexity   EVALUATION COMPLEXITY: Moderate     GOALS: Goals reviewed with patient? Yes   SHORT TERM GOALS: Target date: 01/21/2022   Patient will be independent in home exercise program to improve strength/mobility for better functional independence with ADLs. Baseline:   02/11/22: Pt is compliant with his HEP  Goal status: ACHIEVED   2.  Pt will ambulate 200+ feet using RW without LOB or LLE buckling to demonstrate progression towards improved ambulatory endurance.  Baseline: 185f .    02/11/22: Pt able to ambulate > 200 feet without LLE buckling Goal status: ACHIEVED   3.   Patient will deny any falls over past 4 weeks to demonstrate improved safety at home.  Baseline: 4 falls since surgery.   02/11/22: 2 falls in June.   03/04/22: No falls in previous month.  03/25/22: Pt last had fall in home 03/22/22.  04/15/22: One fall in home about 3.5 weeks ago.   Goal status: NOT MET      LONG TERM GOALS: Target date: 03/04/2022   Patient will increase FOTO score to equal to or greater than 41 to demonstrate statistically significant improvement in mobility and quality of life.  Baseline: 01/07/22: 23/41.  7/11/2: 44/41.  04/15/22: 52/41 Goal status: ACHIEVED   2.   Patient (< 644years old) will complete five times sit to stand test in < 10 seconds indicating an increased LE  strength and improved balance. Baseline: 01/07/22: 43 seconds.   02/11/22: 36.1 sec.  03/04/22: 27.4 seconds.    03/18/22: 21.6 seconds.  04/15/22: 16 seconds.  Goal status: IN PROGRESS   3.  Patient will increase 10 meter walk test to >1.038m as to improve gait speed for better community ambulation and to reduce fall risk. Baseline: 01/07/22: 0.41 m/s.   02/11/22: 0.65 m/s.    03/04/22: 0.65 m/s fast speed   03/18/22: 0.90 m/s fast speed.   04/15/22: 0.90 m/s Goal status: IN PROGRESS   4.  Patient will increase six minute walk test distance to >1000 for progression to community ambulator and improve gait ability. Baseline: 01/14/22: 280 feet.  02/11/22: 600 feet.   03/04/22: 735 feet.   03/18/22: 840 feet.   04/15/22: 840 feet.  Goal status: IN PROGRESS   5.  Patient will increase LLE gross strength to 4/5 as to improve functional strength for independent gait, increased standing tolerance and increased ADL ability. Baseline: 01/07/22: ranging 3-4/5, please see objective.   02/11/22: 4 to 4+/5 MMTs, see above.  Goal status: ACHIEVED         PLAN: PT FREQUENCY: 2x/week   PT DURATION: 4-5 weeks   PLANNED INTERVENTIONS: Therapeutic exercises, Therapeutic activity, Neuromuscular re-education, Balance training, Gait training, Patient/Family education, Joint mobilization,  Stair training, DME instructions, Dry Needling, Cryotherapy, Moist heat, and Manual therapy.   PLAN FOR NEXT SESSION: LE strengthening, trunk stabilization, LE flexibility. Progress with increasing emphasis on standing/weightbearing activity and continued AD wean as able from [facility] to no AD. Modalities and manual therapy for pain modulation as needed. Continue PT with increasing exercise intensity as able with careful CGA/SBA to prevent fall.  Recommend continued PT 2x/week for 4 weeks    Valentina Gu, PT, DPT #[id number]  Eilleen Kempf, PT 5:11 AM 04/26/2022

## 2022-04-26 ENCOUNTER — Encounter: Payer: Self-pay | Admitting: Physical Therapy

## 2022-04-28 ENCOUNTER — Ambulatory Visit: Payer: Medicare Other

## 2022-04-28 DIAGNOSIS — M6281 Muscle weakness (generalized): Secondary | ICD-10-CM

## 2022-04-28 DIAGNOSIS — Z981 Arthrodesis status: Secondary | ICD-10-CM

## 2022-04-28 DIAGNOSIS — M545 Low back pain, unspecified: Secondary | ICD-10-CM

## 2022-04-28 DIAGNOSIS — R2681 Unsteadiness on feet: Secondary | ICD-10-CM

## 2022-04-28 DIAGNOSIS — R269 Unspecified abnormalities of gait and mobility: Secondary | ICD-10-CM

## 2022-04-28 NOTE — Therapy (Signed)
OUTPATIENT PHYSICAL THERAPY TREATMENT     Patient Name: Justin Nixon MRN: [id number] DOB:1975-10-19, 46 y.o., male Today's Date: 04/24/2022   END OF SESSION:   PT End of Session - 04/28/22 1106     Visit Number 32    Number of Visits 36    Date for PT Re-Evaluation 04/24/22    Progress Note Due on Visit 23    PT Start Time 1104    PT Stop Time 1145    PT Time Calculation (min) 41 min    Equipment Utilized During Treatment Gait belt    Activity Tolerance Patient limited by fatigue    Behavior During Therapy WFL for tasks assessed/performed                Past Medical History:  Diagnosis Date   Anxiety    Arthritis    GERD (gastroesophageal reflux disease)    H/O   Hyperlipidemia    Schizophrenia ([place])    Past Surgical History:  Procedure Laterality Date   ANTERIOR LATERAL LUMBAR FUSION WITH PERCUTANEOUS SCREW 1 LEVEL N/A 11/18/2021   Procedure: L4-5 LATERAL LUMBAR INTERBODY FUSION WITH POSTERIOR SPINAL FUSION;  Surgeon: Meade Maw, MD;  Location: [facility];  Service: Neurosurgery;  Laterality: N/A;   APPLICATION OF INTRAOPERATIVE CT SCAN N/A 11/18/2021   Procedure: APPLICATION OF INTRAOPERATIVE CT SCAN;  Surgeon: Meade Maw, MD;  Location: [facility];  Service: Neurosurgery;  Laterality: N/A;   COLONOSCOPY     CYST EXCISION     ELBOW ARTHROSCOPY Left 10/10/2020   Procedure: LEFT ELBOW ARTHROSCOPY WITH DEBRIDEMENT AND EXCISION OF LOOSE BODIES;  Surgeon: Corky Mull, MD;  Location: [facility];  Service: Orthopedics;  Laterality: Left;   MYRINGOTOMY WITH TUBE PLACEMENT     ORIF ELBOW FRACTURE Left 08/28/2021   Procedure: OPEN REDUCTION INTERNAL FIXATION (ORIF) ELBOW/OLECRANON FRACTURE;  Surgeon: Corky Mull, MD;  Location: [facility];  Service: Orthopedics;  Laterality: Left;   SHOULDER ARTHROSCOPY Left 03/18/2017   Procedure: ARTHROSCOPY SHOULDER with debridement decompression and distal clavicle excision;  Surgeon: Corky Mull, MD;  Location: [place];  Service: Orthopedics;  Laterality: Left;   SHOULDER ARTHROSCOPY WITH OPEN ROTATOR CUFF REPAIR Right 09/23/2016   Procedure: SHOULDER ARTHROSCOPY WITH OPEN ROTATOR CUFF REPAIR;  Surgeon: Corky Mull, MD;  Location: [facility];  Service: Orthopedics;  Laterality: Right;   SHOULDER ARTHROSCOPY WITH SUBACROMIAL DECOMPRESSION Right 09/23/2016   Procedure: SHOULDER ARTHROSCOPY WITH SUBACROMIAL DECOMPRESSION AND DEBRIDEMENT;  Surgeon: Corky Mull, MD;  Location: [facility];  Service: Orthopedics;  Laterality: Right;   Patient Active Problem List   Diagnosis Date Noted   S/P spinal fusion 11/18/2021   Olecranon fracture, left, open type I or II, initial encounter 08/28/2021      PCP: Baxter Hire MD REFERRING PROVIDER: Meade Maw, MD     REFERRING DIAG: Z98.1 (ICD-10-CM) - History of lumbar fusion   THERAPY DIAG:  Muscle weakness (generalized)   S/P lumbar fusion   Abnormality of gait and mobility   Unsteadiness on feet   Rationale for Evaluation and Treatment Rehabilitation   PERTINENT HISTORY: Pt presents to physical therapy for evaluation of low back and LE pain and weakness L>R, following L4-L5 XLIF/PSF on 11/18/21. Pt states he was using a cane before surgery due to onset of LLE weakness. States he has now been using a RW since surgery. Pain ranges from a 5/10 to a 10/10. When aggravated pain is described as sharp and shooting  into LLE; at baseline, pain is achy. Wearing clothes such as jeans is painful due to sensitivity of skin; at home wears pajamas. Pt has not returned to driving; he is limited to household distance ambulation or maximum into a doctors office. He feels as though his LLE is going to "give out" when he is walking or weightbearing. 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 Reports frequent falling since surgery due to LLE giving out; no  injuries associated with falls.    PRECAUTIONS: None at this time following 03/13/22 visit with Dr. Izora Ribas     OBJECTIVE: (objective measures completed at initial evaluation unless otherwise dated)   DIAGNOSTIC FINDINGS:  "Intraoperative images during L4-L5 anterior and posterior fusion. No evidence of immediate complication"   PATIENT SURVEYS:  Modified Oswestry (updated 01/09/22 due to incomplete form): 62%.   02/11/22: 48%.  03/04/22: 28% FOTO: eval 23/41,   02/11/22: 44/41   SCREENING FOR RED FLAGS: Bowel or bladder incontinence: No Spinal tumors: No Cauda equina syndrome: No Compression fracture: No Abdominal aneurysm: No   COGNITION:           Overall cognitive status: Within functional limits for tasks assessed                          SENSATION: Light touch: Impaired  - increased sensitivity in left L2 and L3 dermatomes    MUSCLE LENGTH: Hamstrings: unable to test due to increase in sharp/shooting pain in hip flexors prior to HS stretch being felt.  (01/28/22): R lacking 30 deg, L lacking 40 deg.   Thomas test: NT   POSTURE: decreased lumbar lordosis and posterior pelvic tilt   PALPATION: Right: hip flexors and lateral hip, glute med, piriformis, deep external rotators, QL Left: hip flexors, lateral hip and adductors (from proximal hip to mid-thigh), proximal hamstrings, L4-distal paraspinals, QL, all gluteals   OBSERVATION 01/21/22: No bruising, ecchymosis, increased tissue temperature, or erythema observed along L flank, L gluteal region, or adjacent to surgical incisions. No sign of infection or delayed healing   LUMBAR ROM: not assessed due to back precautions    Active  A/PROM  eval  Flexion -  Extension -  Right lateral flexion -  Left lateral flexion -  Right rotation -  Left rotation -   (Blank rows = not tested)   LOWER EXTREMITY ROM:      Passive  Right eval Left eval  Hip flexion 100 90  Hip extension NT NT  Hip abduction 20 20  Hip adduction       Hip internal rotation      Hip external rotation      Knee flexion [facility]  Knee extension [facility]  Ankle dorsiflexion [facility]  Ankle plantarflexion      Ankle inversion      Ankle eversion       (Blank rows = not tested)   LOWER EXTREMITY MMT:     MMT Right eval Left eval Right 02/11/22 Left 02/11/22  Hip flexion 4-* 4-* 4+ 4*  Hip extension          Hip abduction 4+* 4-* 4+ 4+  Hip adduction 4* 4* 4+ 4+  Hip internal rotation 5 3* 5 4*  Hip external rotation 5 4* 5 4*  Knee flexion 5 4* 5 4+  Knee extension 5 4+ 5 4  Ankle  dorsiflexion 4+ 3+ 5 4  Ankle plantarflexion           (Blank rows = not tested)     FUNCTIONAL TESTS:  5 times sit to stand: 43.37 seconds using arms.    02/11/22: 36.1 seconds.  03/04/22: 27.4 seconds.  6 minute walk test (performed 01/14/22): 280 feet.     02/11/22: 600 feet.  03/04/22: 735 feet. 10 meter walk test: 24.3 seconds (comfortable) = 0.41 m/s using RW; terminated fast-pace for safety.   02/11/22: 0.64 m/s comfortable speed, 0.65 m/s fast speed.  03/04/22: 0.65 m/s fast speed   STS mechanics: no UE support, use of momentum with rapid hip hinge to initiate sit to stand. Intermittent LLE buckling and varus/valgus immediately following initiation of sit to stand             -03/04/22: After 2 attempts pt able to perform sit to stand with no UE support, upward velocity of transfers slows down halfway through transfer with intermittent LLE varus/valgus   GAIT: 03/04/22: Distance walked: 735 feet Assistive device utilized: [facility] Level of assistance: standby assist Comments: good heel to toe pattern, intermittent LLE buckling requiring pt holding wall and intermittently placing hands on his L thigh for support; no assist required from PT to prevent fall     Injury screen 04/08/22 Homan's sign: Negative. Ankle inversion and eversion stress: Negative Dorsiflexion-eversion test: Negative  No significant color change, erythema, ecchymosis, venous distension,  or girth asymmetry along calf between R and LLE. Pt has non-pitting edema along distal tibia medially       TODAY'S TREATMENT    SUBJECTIVE: Patient reports no pain at arrival. Had a flare up in LLE up to 5/10 on Friday. Has PCP appt in October planning to discuss swelling in LLE.  PAIN:  Are you having pain? No Pain location: -       Therapeutic Exercise - for improved soft tissue flexibility and extensibility as needed for ROM, improved strength as needed to improve performance of CKC activities/functional movements and prevention of fall during episode of LOB, progressive standing/weightbearing activity to improve capacity for community-level mobility with decreasing reliance on AD/external support   SciFit seated elliptical; Level 7; seat at 10, 5 minutes - subjective information gathered during this time   Total Gym; Level 22; L single-leg minisquat; 1x10, 1x6, 1x3. Noted tremulous activity of LLE due to weakness. (completed "drop set" strategy for 3 sets due to L quad fatigability)       Forward step up to step tap on 2nd step; 2x12; staircase in center of gym. Intermittent HHA on rails due to LLE fatigue. SBA.   STS with airex pad under RLE to improve LLE strength: 1x10, 1x7, 1x4 Close SBA.    In hallway: Gait with ball toss from PT, wall bounce with forward/backward stepping x2 D/B and floor bounce with forward/backward stepping x 4 D/B. On second bouts engaged cognitive dual task with simple math equations and then naming car/shoe brands. Noted decreased gait speed with dual task challenging.     SLS on LLE: 3x30 sec. Intermittent L hand support. SBA.  Lateral lunge on LLE, onto BOSU: 3x12, intermittent HHA on // bars due to LLE weakness.      HOME EXERCISE PROGRAM: Access Code [email or web address]       ASSESSMENT:   CLINICAL IMPRESSION: Continuing PT POC with focus on improving LLE strength and dynamic gait/balance. Pt does demonstrate adequate LE strength during first  set but as second and  third set follows pt demonstrates LLE fatigue. Pt challenged with cognitive dual task this date with overall well maintained balance but demonstrated decreased gait speed and delayed response to cognitive task. Pt will benefit from skilled physical therapy to address the noted deficits in strength, weakness, and community-level mobility in order to increase safety with functional activities and allow pt to return to work and home duties.      OBJECTIVE IMPAIRMENTS Abnormal gait, decreased activity tolerance, decreased balance, decreased endurance, decreased mobility, difficulty walking, decreased ROM, decreased strength, decreased safety awareness, impaired flexibility, impaired sensation, postural dysfunction, and pain.    ACTIVITY LIMITATIONS carrying, lifting, bending, sitting, standing, squatting, stairs, and transfers   PARTICIPATION LIMITATIONS: cleaning, laundry, driving, shopping, community activity, occupation, and yard work   PERSONAL FACTORS Time since onset of injury/illness/exacerbation and 1-2 comorbidities: schizophrenia and anxiety  are also affecting patient's functional outcome.    REHAB POTENTIAL: Good   CLINICAL DECISION MAKING: Evolving/moderate complexity   EVALUATION COMPLEXITY: Moderate     GOALS: Goals reviewed with patient? Yes   SHORT TERM GOALS: Target date: 01/21/2022   Patient will be independent in home exercise program to improve strength/mobility for better functional independence with ADLs. Baseline:   02/11/22: Pt is compliant with his HEP  Goal status: ACHIEVED   2.  Pt will ambulate 200+ feet using RW without LOB or LLE buckling to demonstrate progression towards improved ambulatory endurance.  Baseline: 169f .    02/11/22: Pt able to ambulate > 200 feet without LLE buckling Goal status: ACHIEVED   3.   Patient will deny any falls over past 4 weeks to demonstrate improved safety at home.  Baseline: 4 falls since surgery.    02/11/22: 2 falls in June.   03/04/22: No falls in previous month.  03/25/22: Pt last had fall in home 03/22/22.  04/15/22: One fall in home about 3.5 weeks ago.   Goal status: NOT MET      LONG TERM GOALS: Target date: 03/04/2022   Patient will increase FOTO score to equal to or greater than 41 to demonstrate statistically significant improvement in mobility and quality of life.  Baseline: 01/07/22: 23/41.  7/11/2: 44/41.  04/15/22: 52/41 Goal status: ACHIEVED   2.   Patient (< 636years old) will complete five times sit to stand test in < 10 seconds indicating an increased LE strength and improved balance. Baseline: 01/07/22: 43 seconds.   02/11/22: 36.1 sec.  03/04/22: 27.4 seconds.    03/18/22: 21.6 seconds.  04/15/22: 16 seconds.  Goal status: IN PROGRESS   3.  Patient will increase 10 meter walk test to >1.093m as to improve gait speed for better community ambulation and to reduce fall risk. Baseline: 01/07/22: 0.41 m/s.   02/11/22: 0.65 m/s.    03/04/22: 0.65 m/s fast speed   03/18/22: 0.90 m/s fast speed.   04/15/22: 0.90 m/s Goal status: IN PROGRESS   4.  Patient will increase six minute walk test distance to >1000 for progression to community ambulator and improve gait ability. Baseline: 01/14/22: 280 feet.  02/11/22: 600 feet.   03/04/22: 735 feet.   03/18/22: 840 feet.   04/15/22: 840 feet.  Goal status: IN PROGRESS   5.  Patient will increase LLE gross strength to 4/5 as to improve functional strength for independent gait, increased standing tolerance and increased ADL ability. Baseline: 01/07/22: ranging 3-4/5, please see objective.   02/11/22: 4 to 4+/5 MMTs, see above.  Goal status: ACHIEVED  PLAN: PT FREQUENCY: 2x/week   PT DURATION: 4-5 weeks   PLANNED INTERVENTIONS: Therapeutic exercises, Therapeutic activity, Neuromuscular re-education, Balance training, Gait training, Patient/Family education, Joint mobilization, Stair training, DME instructions, Dry Needling, Cryotherapy, Moist heat, and  Manual therapy.   PLAN FOR NEXT SESSION: LE strengthening, trunk stabilization, LE flexibility. Progress with increasing emphasis on standing/weightbearing activity and continued AD wean as able from [facility] to no AD. Modalities and manual therapy for pain modulation as needed. Continue PT with increasing exercise intensity as able with careful CGA/SBA to prevent fall.  Recommend continued PT 2x/week for 4 weeks    Anisah Kuck M. Fairly IV, PT, DPT Physical Therapist- [facility]  12:55 PM 04/28/2022

## 2022-04-29 ENCOUNTER — Encounter: Payer: Medicare Other | Admitting: Physical Therapy

## 2022-04-30 ENCOUNTER — Encounter: Payer: Self-pay | Admitting: Physical Therapy

## 2022-04-30 ENCOUNTER — Ambulatory Visit: Payer: Medicare Other

## 2022-04-30 DIAGNOSIS — M6281 Muscle weakness (generalized): Secondary | ICD-10-CM

## 2022-04-30 DIAGNOSIS — R2681 Unsteadiness on feet: Secondary | ICD-10-CM

## 2022-04-30 DIAGNOSIS — Z981 Arthrodesis status: Secondary | ICD-10-CM

## 2022-04-30 DIAGNOSIS — R269 Unspecified abnormalities of gait and mobility: Secondary | ICD-10-CM

## 2022-04-30 DIAGNOSIS — M545 Low back pain, unspecified: Secondary | ICD-10-CM

## 2022-04-30 NOTE — Therapy (Signed)
OUTPATIENT PHYSICAL THERAPY TREATMENT     Patient Name: Justin Nixon MRN: [id number] DOB:[birth year], 46 y.o., male Today's Date: 04/24/2022   END OF SESSION:   PT End of Session - 04/30/22 1103     Visit Number 33    Number of Visits 36    Date for PT Re-Evaluation 04/24/22    Progress Note Due on Visit 52    PT Start Time 1102    PT Stop Time 1145    PT Time Calculation (min) 43 min    Equipment Utilized During Treatment Gait belt    Activity Tolerance Patient tolerated treatment well    Behavior During Therapy WFL for tasks assessed/performed                Past Medical History:  Diagnosis Date   Anxiety    Arthritis    GERD (gastroesophageal reflux disease)    H/O   Hyperlipidemia    Schizophrenia ([place])    Past Surgical History:  Procedure Laterality Date   ANTERIOR LATERAL LUMBAR FUSION WITH PERCUTANEOUS SCREW 1 LEVEL N/A 11/18/2021   Procedure: L4-5 LATERAL LUMBAR INTERBODY FUSION WITH POSTERIOR SPINAL FUSION;  Surgeon: Meade Maw, MD;  Location: [facility];  Service: Neurosurgery;  Laterality: N/A;   APPLICATION OF INTRAOPERATIVE CT SCAN N/A 11/18/2021   Procedure: APPLICATION OF INTRAOPERATIVE CT SCAN;  Surgeon: Meade Maw, MD;  Location: [facility];  Service: Neurosurgery;  Laterality: N/A;   COLONOSCOPY     CYST EXCISION     ELBOW ARTHROSCOPY Left 10/10/2020   Procedure: LEFT ELBOW ARTHROSCOPY WITH DEBRIDEMENT AND EXCISION OF LOOSE BODIES;  Surgeon: Corky Mull, MD;  Location: [facility];  Service: Orthopedics;  Laterality: Left;   MYRINGOTOMY WITH TUBE PLACEMENT     ORIF ELBOW FRACTURE Left 08/28/2021   Procedure: OPEN REDUCTION INTERNAL FIXATION (ORIF) ELBOW/OLECRANON FRACTURE;  Surgeon: Corky Mull, MD;  Location: [facility];  Service: Orthopedics;  Laterality: Left;   SHOULDER ARTHROSCOPY Left 03/18/2017   Procedure: ARTHROSCOPY SHOULDER with debridement decompression and distal clavicle excision;  Surgeon: Corky Mull, MD;  Location:  [facility];  Service: Orthopedics;  Laterality: Left;   SHOULDER ARTHROSCOPY WITH OPEN ROTATOR CUFF REPAIR Right 09/23/2016   Procedure: SHOULDER ARTHROSCOPY WITH OPEN ROTATOR CUFF REPAIR;  Surgeon: Corky Mull, MD;  Location: [facility];  Service: Orthopedics;  Laterality: Right;   SHOULDER ARTHROSCOPY WITH SUBACROMIAL DECOMPRESSION Right 09/23/2016   Procedure: SHOULDER ARTHROSCOPY WITH SUBACROMIAL DECOMPRESSION AND DEBRIDEMENT;  Surgeon: Corky Mull, MD;  Location: [facility];  Service: Orthopedics;  Laterality: Right;   Patient Active Problem List   Diagnosis Date Noted   S/P spinal fusion 11/18/2021   Olecranon fracture, left, open type I or II, initial encounter 08/28/2021      PCP: Baxter Hire MD REFERRING PROVIDER: Meade Maw, MD     REFERRING DIAG: Z98.1 (ICD-10-CM) - History of lumbar fusion   THERAPY DIAG:  Muscle weakness (generalized)   S/P lumbar fusion   Abnormality of gait and mobility   Unsteadiness on feet   Rationale for Evaluation and Treatment Rehabilitation   PERTINENT HISTORY: Pt presents to physical therapy for evaluation of low back and LE pain and weakness L>R, following L4-L5 XLIF/PSF on 11/18/21. Pt states he was using a cane before surgery due to onset of LLE weakness. States he has now been using a RW since surgery. Pain ranges from a 5/10 to a 10/10. When aggravated pain is described as sharp and shooting  into LLE; at baseline, pain is achy. Wearing clothes such as jeans is painful due to sensitivity of skin; at home wears pajamas. Pt has not returned to driving; he is limited to household distance ambulation or maximum into a doctors office. He feels as though his LLE is going to "give out" when he is walking or weightbearing. 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 Reports frequent falling since surgery due to LLE giving out; no  injuries associated with falls.    PRECAUTIONS: None at this time following 03/13/22 visit with Dr. Izora Ribas     OBJECTIVE: (objective measures completed at initial evaluation unless otherwise dated)   DIAGNOSTIC FINDINGS:  "Intraoperative images during L4-L5 anterior and posterior fusion. No evidence of immediate complication"   PATIENT SURVEYS:  Modified Oswestry (updated 01/09/22 due to incomplete form): 62%.   02/11/22: 48%.  03/04/22: 28% FOTO: eval 23/41,   02/11/22: 44/41   SCREENING FOR RED FLAGS: Bowel or bladder incontinence: No Spinal tumors: No Cauda equina syndrome: No Compression fracture: No Abdominal aneurysm: No   COGNITION:           Overall cognitive status: Within functional limits for tasks assessed                          SENSATION: Light touch: Impaired  - increased sensitivity in left L2 and L3 dermatomes    MUSCLE LENGTH: Hamstrings: unable to test due to increase in sharp/shooting pain in hip flexors prior to HS stretch being felt.  (01/28/22): R lacking 30 deg, L lacking 40 deg.   Thomas test: NT   POSTURE: decreased lumbar lordosis and posterior pelvic tilt   PALPATION: Right: hip flexors and lateral hip, glute med, piriformis, deep external rotators, QL Left: hip flexors, lateral hip and adductors (from proximal hip to mid-thigh), proximal hamstrings, L4-distal paraspinals, QL, all gluteals   OBSERVATION 01/21/22: No bruising, ecchymosis, increased tissue temperature, or erythema observed along L flank, L gluteal region, or adjacent to surgical incisions. No sign of infection or delayed healing   LUMBAR ROM: not assessed due to back precautions    Active  A/PROM  eval  Flexion -  Extension -  Right lateral flexion -  Left lateral flexion -  Right rotation -  Left rotation -   (Blank rows = not tested)   LOWER EXTREMITY ROM:      Passive  Right eval Left eval  Hip flexion 100 90  Hip extension NT NT  Hip abduction 20 20  Hip adduction       Hip internal rotation      Hip external rotation      Knee flexion [facility]  Knee extension [facility]  Ankle dorsiflexion [facility]  Ankle plantarflexion      Ankle inversion      Ankle eversion       (Blank rows = not tested)   LOWER EXTREMITY MMT:     MMT Right eval Left eval Right 02/11/22 Left 02/11/22  Hip flexion 4-* 4-* 4+ 4*  Hip extension          Hip abduction 4+* 4-* 4+ 4+  Hip adduction 4* 4* 4+ 4+  Hip internal rotation 5 3* 5 4*  Hip external rotation 5 4* 5 4*  Knee flexion 5 4* 5 4+  Knee extension 5 4+ 5 4  Ankle  dorsiflexion 4+ 3+ 5 4  Ankle plantarflexion           (Blank rows = not tested)     FUNCTIONAL TESTS:  5 times sit to stand: 43.37 seconds using arms.    02/11/22: 36.1 seconds.  03/04/22: 27.4 seconds.  6 minute walk test (performed 01/14/22): 280 feet.     02/11/22: 600 feet.  03/04/22: 735 feet. 10 meter walk test: 24.3 seconds (comfortable) = 0.41 m/s using RW; terminated fast-pace for safety.   02/11/22: 0.64 m/s comfortable speed, 0.65 m/s fast speed.  03/04/22: 0.65 m/s fast speed   STS mechanics: no UE support, use of momentum with rapid hip hinge to initiate sit to stand. Intermittent LLE buckling and varus/valgus immediately following initiation of sit to stand             -03/04/22: After 2 attempts pt able to perform sit to stand with no UE support, upward velocity of transfers slows down halfway through transfer with intermittent LLE varus/valgus   GAIT: 03/04/22: Distance walked: 735 feet Assistive device utilized: [facility] Level of assistance: standby assist Comments: good heel to toe pattern, intermittent LLE buckling requiring pt holding wall and intermittently placing hands on his L thigh for support; no assist required from PT to prevent fall     Injury screen 04/08/22 Homan's sign: Negative. Ankle inversion and eversion stress: Negative Dorsiflexion-eversion test: Negative  No significant color change, erythema, ecchymosis, venous distension,  or girth asymmetry along calf between R and LLE. Pt has non-pitting edema along distal tibia medially       TODAY'S TREATMENT    SUBJECTIVE: Patient reports an onset of "lightning bolt" like symptoms down his LLE for ~ 1 sec. Denies falls or soreness after last session.  PAIN:  Are you having pain? No Pain location: -       Therapeutic Exercise - for improved soft tissue flexibility and extensibility as needed for ROM, improved strength as needed to improve performance of CKC activities/functional movements and prevention of fall during episode of LOB, progressive standing/weightbearing activity to improve capacity for community-level mobility with decreasing reliance on AD/external support.   SciFit seated elliptical; Level 7; seat at 10, 5 minutes - subjective information gathered during this time   Total Gym; Level 22; L single-leg minisquat; 1x10, 1x6, 1x34 Noted tremulous activity of LLE due to weakness. (completed "drop set" strategy for 3 sets due to L quad fatigability)   Forward step up to step tap on 2nd step; 2x12; staircase in center of gym. Intermittent HHA on rails due to LLE fatigue. SBA.   STS with x1 airex pad under RLE to improve LLE strength: 1x10, 1x10, 1x10 Close SBA overall but CGA on last set with VC's for anterior weight shift to assist in standing due to LE fatigue.   Lateral L sided s6" step up: 2x10. 1x10 1ith BUE support needed and minA at L knee to maintain neutral position and to reach TKE.   Lateral side steps alternating hurdle heights. 3# AW's. X6 laps forwards, x6 laps lateral. Intermittent UE support needed on // bar for support. SBA.   Resisted walking laterally in // bars. X1 BTB. CGA, x5 laps R and L.      HOME EXERCISE PROGRAM: Access Code [email or web address]       ASSESSMENT:   CLINICAL IMPRESSION:  Continuing PT POC with LLE strength and and dynamic balance. Pt made excellent progress today with ability to maintain almost 3x10 on all LLE  dominant  exercises this date. Pt remains tremulous on second and third sets and requires light SUE/ to BUE support for stability but is able to complete. Progressing to LLE lateral movements to challenge lateral hip stability with reports of difficulty and knee valgus requiring UE support. Pt will benefit from skilled physical therapy to address the noted deficits in strength, weakness, and community-level mobility in order to increase safety with functional activities and allow pt to return to work and home duties.     OBJECTIVE IMPAIRMENTS Abnormal gait, decreased activity tolerance, decreased balance, decreased endurance, decreased mobility, difficulty walking, decreased ROM, decreased strength, decreased safety awareness, impaired flexibility, impaired sensation, postural dysfunction, and pain.    ACTIVITY LIMITATIONS carrying, lifting, bending, sitting, standing, squatting, stairs, and transfers   PARTICIPATION LIMITATIONS: cleaning, laundry, driving, shopping, community activity, occupation, and yard work   PERSONAL FACTORS Time since onset of injury/illness/exacerbation and 1-2 comorbidities: schizophrenia and anxiety  are also affecting patient's functional outcome.    REHAB POTENTIAL: Good   CLINICAL DECISION MAKING: Evolving/moderate complexity   EVALUATION COMPLEXITY: Moderate     GOALS: Goals reviewed with patient? Yes   SHORT TERM GOALS: Target date: 01/21/2022   Patient will be independent in home exercise program to improve strength/mobility for better functional independence with ADLs. Baseline:   02/11/22: Pt is compliant with his HEP  Goal status: ACHIEVED   2.  Pt will ambulate 200+ feet using RW without LOB or LLE buckling to demonstrate progression towards improved ambulatory endurance.  Baseline: 163ft .    02/11/22: Pt able to ambulate > 200 feet without LLE buckling Goal status: ACHIEVED   3.   Patient will deny any falls over past 4 weeks to demonstrate improved  safety at home.  Baseline: 4 falls since surgery.   02/11/22: 2 falls in June.   03/04/22: No falls in previous month.  03/25/22: Pt last had fall in home 03/22/22.  04/15/22: One fall in home about 3.5 weeks ago.   Goal status: NOT MET      LONG TERM GOALS: Target date: 03/04/2022   Patient will increase FOTO score to equal to or greater than 41 to demonstrate statistically significant improvement in mobility and quality of life.  Baseline: 01/07/22: 23/41.  7/11/2: 44/41.  04/15/22: 52/41 Goal status: ACHIEVED   2.   Patient (< 39 years old) will complete five times sit to stand test in < 10 seconds indicating an increased LE strength and improved balance. Baseline: 01/07/22: 43 seconds.   02/11/22: 36.1 sec.  03/04/22: 27.4 seconds.    03/18/22: 21.6 seconds.  04/15/22: 16 seconds.  Goal status: IN PROGRESS   3.  Patient will increase 10 meter walk test to >1.53m/s as to improve gait speed for better community ambulation and to reduce fall risk. Baseline: 01/07/22: 0.41 m/s.   02/11/22: 0.65 m/s.    03/04/22: 0.65 m/s fast speed   03/18/22: 0.90 m/s fast speed.   04/15/22: 0.90 m/s Goal status: IN PROGRESS   4.  Patient will increase six minute walk test distance to >1000 for progression to community ambulator and improve gait ability. Baseline: 01/14/22: 280 feet.  02/11/22: 600 feet.   03/04/22: 735 feet.   03/18/22: 840 feet.   04/15/22: 840 feet.  Goal status: IN PROGRESS   5.  Patient will increase LLE gross strength to 4/5 as to improve functional strength for independent gait, increased standing tolerance and increased ADL ability. Baseline: 01/07/22: ranging 3-4/5, please see objective.   02/11/22:  4 to 4+/5 MMTs, see above.  Goal status: ACHIEVED         PLAN: PT FREQUENCY: 2x/week   PT DURATION: 4-5 weeks   PLANNED INTERVENTIONS: Therapeutic exercises, Therapeutic activity, Neuromuscular re-education, Balance training, Gait training, Patient/Family education, Joint mobilization, Stair training, DME  instructions, Dry Needling, Cryotherapy, Moist heat, and Manual therapy.   PLAN FOR NEXT SESSION: LE strengthening, trunk stabilization, LE flexibility. Progress with increasing emphasis on standing/weightbearing activity and continued AD wean as able from [facility] to no AD. Modalities and manual therapy for pain modulation as needed. Continue PT with increasing exercise intensity as able with careful CGA/SBA to prevent fall.  Recommend continued PT 2x/week for 4 weeks    Peola Joynt M. Fairly IV, PT, DPT Physical Therapist- [facility]  12:22 PM 04/30/2022

## 2022-05-01 ENCOUNTER — Encounter: Payer: Medicare Other | Admitting: Physical Therapy

## 2022-05-05 ENCOUNTER — Ambulatory Visit: Payer: Medicare Other | Attending: Neurosurgery

## 2022-05-05 DIAGNOSIS — Z981 Arthrodesis status: Secondary | ICD-10-CM | POA: Diagnosis present

## 2022-05-05 DIAGNOSIS — R269 Unspecified abnormalities of gait and mobility: Secondary | ICD-10-CM | POA: Insufficient documentation

## 2022-05-05 DIAGNOSIS — R2681 Unsteadiness on feet: Secondary | ICD-10-CM | POA: Diagnosis present

## 2022-05-05 DIAGNOSIS — M545 Low back pain, unspecified: Secondary | ICD-10-CM | POA: Diagnosis present

## 2022-05-05 DIAGNOSIS — M6281 Muscle weakness (generalized): Secondary | ICD-10-CM | POA: Insufficient documentation

## 2022-05-05 NOTE — Therapy (Signed)
OUTPATIENT PHYSICAL THERAPY TREATMENT     Patient Name: Justin Nixon MRN: [id number] DOB:04/06/1976, 46 y.o., male Today's Date: 04/24/2022   END OF SESSION:   PT End of Session - 05/05/22 0804     Visit Number 34    Number of Visits 36    Date for PT Re-Evaluation 04/24/22    Progress Note Due on Visit 66    PT Start Time 0800    PT Stop Time 0845    PT Time Calculation (min) 45 min    Equipment Utilized During Treatment Gait belt    Activity Tolerance Patient tolerated treatment well    Behavior During Therapy WFL for tasks assessed/performed                Past Medical History:  Diagnosis Date   Anxiety    Arthritis    GERD (gastroesophageal reflux disease)    H/O   Hyperlipidemia    Schizophrenia ([place])    Past Surgical History:  Procedure Laterality Date   ANTERIOR LATERAL LUMBAR FUSION WITH PERCUTANEOUS SCREW 1 LEVEL N/A 11/18/2021   Procedure: L4-5 LATERAL LUMBAR INTERBODY FUSION WITH POSTERIOR SPINAL FUSION;  Surgeon: Meade Maw, MD;  Location: [facility];  Service: Neurosurgery;  Laterality: N/A;   APPLICATION OF INTRAOPERATIVE CT SCAN N/A 11/18/2021   Procedure: APPLICATION OF INTRAOPERATIVE CT SCAN;  Surgeon: Meade Maw, MD;  Location: [facility];  Service: Neurosurgery;  Laterality: N/A;   COLONOSCOPY     CYST EXCISION     ELBOW ARTHROSCOPY Left 10/10/2020   Procedure: LEFT ELBOW ARTHROSCOPY WITH DEBRIDEMENT AND EXCISION OF LOOSE BODIES;  Surgeon: Corky Mull, MD;  Location: [facility];  Service: Orthopedics;  Laterality: Left;   MYRINGOTOMY WITH TUBE PLACEMENT     ORIF ELBOW FRACTURE Left 08/28/2021   Procedure: OPEN REDUCTION INTERNAL FIXATION (ORIF) ELBOW/OLECRANON FRACTURE;  Surgeon: Corky Mull, MD;  Location: [facility];  Service: Orthopedics;  Laterality: Left;   SHOULDER ARTHROSCOPY Left 03/18/2017   Procedure: ARTHROSCOPY SHOULDER with debridement decompression and distal clavicle excision;  Surgeon: Corky Mull, MD;  Location:  [place];  Service: Orthopedics;  Laterality: Left;   SHOULDER ARTHROSCOPY WITH OPEN ROTATOR CUFF REPAIR Right 09/23/2016   Procedure: SHOULDER ARTHROSCOPY WITH OPEN ROTATOR CUFF REPAIR;  Surgeon: Corky Mull, MD;  Location: [facility];  Service: Orthopedics;  Laterality: Right;   SHOULDER ARTHROSCOPY WITH SUBACROMIAL DECOMPRESSION Right 09/23/2016   Procedure: SHOULDER ARTHROSCOPY WITH SUBACROMIAL DECOMPRESSION AND DEBRIDEMENT;  Surgeon: Corky Mull, MD;  Location: [facility];  Service: Orthopedics;  Laterality: Right;   Patient Active Problem List   Diagnosis Date Noted   S/P spinal fusion 11/18/2021   Olecranon fracture, left, open type I or II, initial encounter 08/28/2021      PCP: Baxter Hire MD REFERRING PROVIDER: Meade Maw, MD     REFERRING DIAG: Z98.1 (ICD-10-CM) - History of lumbar fusion   THERAPY DIAG:  Muscle weakness (generalized)   S/P lumbar fusion   Abnormality of gait and mobility   Unsteadiness on feet   Rationale for Evaluation and Treatment Rehabilitation   PERTINENT HISTORY: Pt presents to physical therapy for evaluation of low back and LE pain and weakness L>R, following L4-L5 XLIF/PSF on 11/18/21. Pt states he was using a cane before surgery due to onset of LLE weakness. States he has now been using a RW since surgery. Pain ranges from a 5/10 to a 10/10. When aggravated pain is described as sharp and shooting  into LLE; at baseline, pain is achy. Wearing clothes such as jeans is painful due to sensitivity of skin; at home wears pajamas. Pt has not returned to driving; he is limited to household distance ambulation or maximum into a doctors office. He feels as though his LLE is going to "give out" when he is walking or weightbearing. 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 Reports frequent falling since surgery due to LLE giving out; no  injuries associated with falls.    PRECAUTIONS: None at this time following 03/13/22 visit with Dr. Izora Ribas     OBJECTIVE: (objective measures completed at initial evaluation unless otherwise dated)   DIAGNOSTIC FINDINGS:  "Intraoperative images during L4-L5 anterior and posterior fusion. No evidence of immediate complication"   PATIENT SURVEYS:  Modified Oswestry (updated 01/09/22 due to incomplete form): 62%.   02/11/22: 48%.  03/04/22: 28% FOTO: eval 23/41,   02/11/22: 44/41   SCREENING FOR RED FLAGS: Bowel or bladder incontinence: No Spinal tumors: No Cauda equina syndrome: No Compression fracture: No Abdominal aneurysm: No   COGNITION:           Overall cognitive status: Within functional limits for tasks assessed                          SENSATION: Light touch: Impaired  - increased sensitivity in left L2 and L3 dermatomes    MUSCLE LENGTH: Hamstrings: unable to test due to increase in sharp/shooting pain in hip flexors prior to HS stretch being felt.  (01/28/22): R lacking 30 deg, L lacking 40 deg.   Thomas test: NT   POSTURE: decreased lumbar lordosis and posterior pelvic tilt   PALPATION: Right: hip flexors and lateral hip, glute med, piriformis, deep external rotators, QL Left: hip flexors, lateral hip and adductors (from proximal hip to mid-thigh), proximal hamstrings, L4-distal paraspinals, QL, all gluteals   OBSERVATION 01/21/22: No bruising, ecchymosis, increased tissue temperature, or erythema observed along L flank, L gluteal region, or adjacent to surgical incisions. No sign of infection or delayed healing   LUMBAR ROM: not assessed due to back precautions    Active  A/PROM  eval  Flexion -  Extension -  Right lateral flexion -  Left lateral flexion -  Right rotation -  Left rotation -   (Blank rows = not tested)   LOWER EXTREMITY ROM:      Passive  Right eval Left eval  Hip flexion 100 90  Hip extension NT NT  Hip abduction 20 20  Hip adduction       Hip internal rotation      Hip external rotation      Knee flexion [facility]  Knee extension [facility]  Ankle dorsiflexion [facility]  Ankle plantarflexion      Ankle inversion      Ankle eversion       (Blank rows = not tested)   LOWER EXTREMITY MMT:     MMT Right eval Left eval Right 02/11/22 Left 02/11/22  Hip flexion 4-* 4-* 4+ 4*  Hip extension          Hip abduction 4+* 4-* 4+ 4+  Hip adduction 4* 4* 4+ 4+  Hip internal rotation 5 3* 5 4*  Hip external rotation 5 4* 5 4*  Knee flexion 5 4* 5 4+  Knee extension 5 4+ 5 4  Ankle  dorsiflexion 4+ 3+ 5 4  Ankle plantarflexion           (Blank rows = not tested)     FUNCTIONAL TESTS:  5 times sit to stand: 43.37 seconds using arms.    02/11/22: 36.1 seconds.  03/04/22: 27.4 seconds.  6 minute walk test (performed 01/14/22): 280 feet.     02/11/22: 600 feet.  03/04/22: 735 feet. 10 meter walk test: 24.3 seconds (comfortable) = 0.41 m/s using RW; terminated fast-pace for safety.   02/11/22: 0.64 m/s comfortable speed, 0.65 m/s fast speed.  03/04/22: 0.65 m/s fast speed   STS mechanics: no UE support, use of momentum with rapid hip hinge to initiate sit to stand. Intermittent LLE buckling and varus/valgus immediately following initiation of sit to stand             -03/04/22: After 2 attempts pt able to perform sit to stand with no UE support, upward velocity of transfers slows down halfway through transfer with intermittent LLE varus/valgus   GAIT: 03/04/22: Distance walked: 735 feet Assistive device utilized: [facility] Level of assistance: standby assist Comments: good heel to toe pattern, intermittent LLE buckling requiring pt holding wall and intermittently placing hands on his L thigh for support; no assist required from PT to prevent fall     Injury screen 04/08/22 Homan's sign: Negative. Ankle inversion and eversion stress: Negative Dorsiflexion-eversion test: Negative  No significant color change, erythema, ecchymosis, venous distension,  or girth asymmetry along calf between R and LLE. Pt has non-pitting edema along distal tibia medially       TODAY'S TREATMENT    SUBJECTIVE: Patient reports no new complaints upon arrival.   PAIN:  Are you having pain? No Pain location: -       Therapeutic Exercise - for improved soft tissue flexibility and extensibility as needed for ROM, improved strength as needed to improve performance of CKC activities/functional movements and prevention of fall during episode of LOB, progressive standing/weightbearing activity to improve capacity for community-level mobility with decreasing reliance on AD/external support.   SciFit seated elliptical; Level 7; seat at 10, 5 minutes - subjective information gathered during this time   Total Gym; Level 22; L single-leg minisquat; 1x10, 1x6, 1x34 Noted tremulous activity of LLE due to weakness. (completed "drop set" strategy for 3 sets due to L quad fatigability)   Forward step up to step tap on 2nd step; 2x12; staircase in center of gym. Intermittent HHA on rails due to LLE fatigue. SBA.   STS with x1 airex pad under RLE to improve LLE strength: 1x10, 1x10, 1x10 Close SBA overall but CGA on last set with VC's for anterior weight shift to assist in standing due to LE fatigue.   Lateral L sided s6" step up: 2x10. 1x10 1ith BUE support needed and minA at L knee to maintain neutral position and to reach TKE.   Heel raises: unilateral 2x8 L LE, bilateral hand support on railing  Standing marches with 4# ankle weights 2 x 1 minute, PT used hand as target for L hip flexion; pt unilateral hand support on railing  Lateral side steps alternating hurdle heights. 5# AW's. X6 laps forwards, x6 laps lateral. Intermittent UE support needed on // bar for support. SBA.   Resisted walking laterally in // bars. X1 BTB. CGA, x5 laps R and L.      HOME EXERCISE PROGRAM: Access Code [email or web address]       ASSESSMENT:   CLINICAL IMPRESSION:  Continuing PT POC  with  LLE strength and and dynamic balance. Pt able to tolerate increased resistance (ankle weights) during standing LE strengthening exercises.  L LE does continue to fatigue/give out suddenly at knee/ankle with second/third sets of dynamic LE strengthening.  Pt able to regain balance with UE support, PT remained SBA intermittently for safety.  Pt will benefit from skilled physical therapy to address the noted deficits in strength, weakness, and community-level mobility in order to increase safety with functional activities and allow pt to return to work and home duties.     OBJECTIVE IMPAIRMENTS Abnormal gait, decreased activity tolerance, decreased balance, decreased endurance, decreased mobility, difficulty walking, decreased ROM, decreased strength, decreased safety awareness, impaired flexibility, impaired sensation, postural dysfunction, and pain.    ACTIVITY LIMITATIONS carrying, lifting, bending, sitting, standing, squatting, stairs, and transfers   PARTICIPATION LIMITATIONS: cleaning, laundry, driving, shopping, community activity, occupation, and yard work   PERSONAL FACTORS Time since onset of injury/illness/exacerbation and 1-2 comorbidities: schizophrenia and anxiety  are also affecting patient's functional outcome.    REHAB POTENTIAL: Good   CLINICAL DECISION MAKING: Evolving/moderate complexity   EVALUATION COMPLEXITY: Moderate     GOALS: Goals reviewed with patient? Yes   SHORT TERM GOALS: Target date: 01/21/2022   Patient will be independent in home exercise program to improve strength/mobility for better functional independence with ADLs. Baseline:   02/11/22: Pt is compliant with his HEP  Goal status: ACHIEVED   2.  Pt will ambulate 200+ feet using RW without LOB or LLE buckling to demonstrate progression towards improved ambulatory endurance.  Baseline: 161ft .    02/11/22: Pt able to ambulate > 200 feet without LLE buckling Goal status: ACHIEVED   3.   Patient will deny  any falls over past 4 weeks to demonstrate improved safety at home.  Baseline: 4 falls since surgery.   02/11/22: 2 falls in June.   03/04/22: No falls in previous month.  03/25/22: Pt last had fall in home 03/22/22.  04/15/22: One fall in home about 3.5 weeks ago.   Goal status: NOT MET      LONG TERM GOALS: Target date: 03/04/2022   Patient will increase FOTO score to equal to or greater than 41 to demonstrate statistically significant improvement in mobility and quality of life.  Baseline: 01/07/22: 23/41.  7/11/2: 44/41.  04/15/22: 52/41 Goal status: ACHIEVED   2.   Patient (< 87 years old) will complete five times sit to stand test in < 10 seconds indicating an increased LE strength and improved balance. Baseline: 01/07/22: 43 seconds.   02/11/22: 36.1 sec.  03/04/22: 27.4 seconds.    03/18/22: 21.6 seconds.  04/15/22: 16 seconds.  Goal status: IN PROGRESS   3.  Patient will increase 10 meter walk test to >1.43m/s as to improve gait speed for better community ambulation and to reduce fall risk. Baseline: 01/07/22: 0.41 m/s.   02/11/22: 0.65 m/s.    03/04/22: 0.65 m/s fast speed   03/18/22: 0.90 m/s fast speed.   04/15/22: 0.90 m/s Goal status: IN PROGRESS   4.  Patient will increase six minute walk test distance to >1000 for progression to community ambulator and improve gait ability. Baseline: 01/14/22: 280 feet.  02/11/22: 600 feet.   03/04/22: 735 feet.   03/18/22: 840 feet.   04/15/22: 840 feet.  Goal status: IN PROGRESS   5.  Patient will increase LLE gross strength to 4/5 as to improve functional strength for independent gait, increased standing tolerance and increased ADL ability.  Baseline: 01/07/22: ranging 3-4/5, please see objective.   02/11/22: 4 to 4+/5 MMTs, see above.  Goal status: ACHIEVED         PLAN: PT FREQUENCY: 2x/week   PT DURATION: 4-5 weeks   PLANNED INTERVENTIONS: Therapeutic exercises, Therapeutic activity, Neuromuscular re-education, Balance training, Gait training, Patient/Family  education, Joint mobilization, Stair training, DME instructions, Dry Needling, Cryotherapy, Moist heat, and Manual therapy.   PLAN FOR NEXT SESSION: LE strengthening, trunk stabilization, LE flexibility. Progress with increasing emphasis on standing/weightbearing activity and continued AD wean as able from [facility] to no AD. Modalities and manual therapy for pain modulation as needed. Continue PT with increasing exercise intensity as able with careful CGA/SBA to prevent fall.  Recommend continued PT 2x/week for 4 weeks    Merdis Delay, PT, DPT, [place]  #[id number]  8:05 AM 05/05/2022

## 2022-05-06 ENCOUNTER — Encounter: Payer: Medicare Other | Admitting: Physical Therapy

## 2022-05-06 NOTE — Therapy (Unsigned)
OUTPATIENT PHYSICAL THERAPY TREATMENT     Patient Name: Justin Nixon MRN: [id number] DOB:02/09/1976, 46 y.o., male Today's Date: 04/24/2022   END OF SESSION:        Past Medical History:  Diagnosis Date   Anxiety    Arthritis    GERD (gastroesophageal reflux disease)    H/O   Hyperlipidemia    Schizophrenia ([place])    Past Surgical History:  Procedure Laterality Date   ANTERIOR LATERAL LUMBAR FUSION WITH PERCUTANEOUS SCREW 1 LEVEL N/A 11/18/2021   Procedure: L4-5 LATERAL LUMBAR INTERBODY FUSION WITH POSTERIOR SPINAL FUSION;  Surgeon: Meade Maw, MD;  Location: [facility];  Service: Neurosurgery;  Laterality: N/A;   APPLICATION OF INTRAOPERATIVE CT SCAN N/A 11/18/2021   Procedure: APPLICATION OF INTRAOPERATIVE CT SCAN;  Surgeon: Meade Maw, MD;  Location: [facility];  Service: Neurosurgery;  Laterality: N/A;   COLONOSCOPY     CYST EXCISION     ELBOW ARTHROSCOPY Left 10/10/2020   Procedure: LEFT ELBOW ARTHROSCOPY WITH DEBRIDEMENT AND EXCISION OF LOOSE BODIES;  Surgeon: Corky Mull, MD;  Location: [facility];  Service: Orthopedics;  Laterality: Left;   MYRINGOTOMY WITH TUBE PLACEMENT     ORIF ELBOW FRACTURE Left 08/28/2021   Procedure: OPEN REDUCTION INTERNAL FIXATION (ORIF) ELBOW/OLECRANON FRACTURE;  Surgeon: Corky Mull, MD;  Location: [facility];  Service: Orthopedics;  Laterality: Left;   SHOULDER ARTHROSCOPY Left 03/18/2017   Procedure: ARTHROSCOPY SHOULDER with debridement decompression and distal clavicle excision;  Surgeon: Corky Mull, MD;  Location: [place];  Service: Orthopedics;  Laterality: Left;   SHOULDER ARTHROSCOPY WITH OPEN ROTATOR CUFF REPAIR Right 09/23/2016   Procedure: SHOULDER ARTHROSCOPY WITH OPEN ROTATOR CUFF REPAIR;  Surgeon: Corky Mull, MD;  Location: [facility];  Service: Orthopedics;  Laterality: Right;   SHOULDER ARTHROSCOPY WITH SUBACROMIAL DECOMPRESSION Right 09/23/2016   Procedure: SHOULDER ARTHROSCOPY WITH SUBACROMIAL  DECOMPRESSION AND DEBRIDEMENT;  Surgeon: Corky Mull, MD;  Location: [facility];  Service: Orthopedics;  Laterality: Right;   Patient Active Problem List   Diagnosis Date Noted   S/P spinal fusion 11/18/2021   Olecranon fracture, left, open type I or II, initial encounter 08/28/2021      PCP: Baxter Hire MD REFERRING PROVIDER: Meade Maw, MD     REFERRING DIAG: Z98.1 (ICD-10-CM) - History of lumbar fusion   THERAPY DIAG:  Muscle weakness (generalized)   S/P lumbar fusion   Abnormality of gait and mobility   Unsteadiness on feet   Rationale for Evaluation and Treatment Rehabilitation   PERTINENT HISTORY: Pt presents to physical therapy for evaluation of low back and LE pain and weakness L>R, following L4-L5 XLIF/PSF on 11/18/21. Pt states he was using a cane before surgery due to onset of LLE weakness. States he has now been using a RW since surgery. Pain ranges from a 5/10 to a 10/10. When aggravated pain is described as sharp and shooting into LLE; at baseline, pain is achy. Wearing clothes such as jeans is painful due to sensitivity of skin; at home wears pajamas. Pt has not returned to driving; he is limited to household distance ambulation or maximum into a doctors office. He feels as though his LLE is going to "give out" when he is walking or weightbearing. 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 Reports frequent falling since surgery due to LLE giving out; no injuries associated with falls.  PRECAUTIONS: None at this time following 03/13/22 visit with Dr. Izora Ribas     OBJECTIVE: (objective measures completed at initial evaluation unless otherwise dated)   DIAGNOSTIC FINDINGS:  "Intraoperative images during L4-L5 anterior and posterior fusion. No evidence of immediate complication"   PATIENT SURVEYS:  Modified Oswestry (updated 01/09/22 due to incomplete  form): 62%.   02/11/22: 48%.  03/04/22: 28% FOTO: eval 23/41,   02/11/22: 44/41   SCREENING FOR RED FLAGS: Bowel or bladder incontinence: No Spinal tumors: No Cauda equina syndrome: No Compression fracture: No Abdominal aneurysm: No   COGNITION:           Overall cognitive status: Within functional limits for tasks assessed                          SENSATION: Light touch: Impaired  - increased sensitivity in left L2 and L3 dermatomes    MUSCLE LENGTH: Hamstrings: unable to test due to increase in sharp/shooting pain in hip flexors prior to HS stretch being felt.  (01/28/22): R lacking 30 deg, L lacking 40 deg.   Thomas test: NT   POSTURE: decreased lumbar lordosis and posterior pelvic tilt   PALPATION: Right: hip flexors and lateral hip, glute med, piriformis, deep external rotators, QL Left: hip flexors, lateral hip and adductors (from proximal hip to mid-thigh), proximal hamstrings, L4-distal paraspinals, QL, all gluteals   OBSERVATION 01/21/22: No bruising, ecchymosis, increased tissue temperature, or erythema observed along L flank, L gluteal region, or adjacent to surgical incisions. No sign of infection or delayed healing   LUMBAR ROM: not assessed due to back precautions    Active  A/PROM  eval  Flexion -  Extension -  Right lateral flexion -  Left lateral flexion -  Right rotation -  Left rotation -   (Blank rows = not tested)   LOWER EXTREMITY ROM:      Passive  Right eval Left eval  Hip flexion 100 90  Hip extension NT NT  Hip abduction 20 20  Hip adduction      Hip internal rotation      Hip external rotation      Knee flexion [facility]  Knee extension [facility]  Ankle dorsiflexion [facility]  Ankle plantarflexion      Ankle inversion      Ankle eversion       (Blank rows = not tested)   LOWER EXTREMITY MMT:     MMT Right eval Left eval Right 02/11/22 Left 02/11/22  Hip flexion 4-* 4-* 4+ 4*  Hip extension          Hip abduction 4+* 4-* 4+ 4+  Hip  adduction 4* 4* 4+ 4+  Hip internal rotation 5 3* 5 4*  Hip external rotation 5 4* 5 4*  Knee flexion 5 4* 5 4+  Knee extension 5 4+ 5 4  Ankle dorsiflexion 4+ 3+ 5 4  Ankle plantarflexion           (Blank rows = not tested)     FUNCTIONAL TESTS:  5 times sit to stand: 43.37 seconds using arms.    02/11/22: 36.1 seconds.  03/04/22: 27.4 seconds.  6 minute walk test (performed 01/14/22): 280 feet.     02/11/22: 600 feet.  03/04/22: 735 feet. 10 meter walk test: 24.3 seconds (comfortable) = 0.41 m/s using RW; terminated fast-pace for safety.   02/11/22: 0.64 m/s comfortable speed, 0.65 m/s fast speed.  03/04/22: 0.65  m/s fast speed   STS mechanics: no UE support, use of momentum with rapid hip hinge to initiate sit to stand. Intermittent LLE buckling and varus/valgus immediately following initiation of sit to stand             -03/04/22: After 2 attempts pt able to perform sit to stand with no UE support, upward velocity of transfers slows down halfway through transfer with intermittent LLE varus/valgus   GAIT: 03/04/22: Distance walked: 735 feet Assistive device utilized: [facility] Level of assistance: standby assist Comments: good heel to toe pattern, intermittent LLE buckling requiring pt holding wall and intermittently placing hands on his L thigh for support; no assist required from PT to prevent fall     Injury screen 04/08/22 Homan's sign: Negative. Ankle inversion and eversion stress: Negative Dorsiflexion-eversion test: Negative  No significant color change, erythema, ecchymosis, venous distension, or girth asymmetry along calf between R and LLE. Pt has non-pitting edema along distal tibia medially       TODAY'S TREATMENT    SUBJECTIVE: Patient reports no new complaints upon arrival.   PAIN:  Are you having pain? No Pain location: -       Therapeutic Exercise - for improved soft tissue flexibility and extensibility as needed for ROM, improved strength as needed to improve performance of  [facility] activities/functional movements and prevention of fall during episode of LOB, progressive standing/weightbearing activity to improve capacity for community-level mobility with decreasing reliance on AD/external support.   SciFit seated elliptical; Level 7; seat at 10, 5 minutes - subjective information gathered during this time   Total Gym; Level 22; L single-leg minisquat; 1x10, 1x6, 1x34 Noted tremulous activity of LLE due to weakness. (completed "drop set" strategy for 3 sets due to L quad fatigability)   Forward step up to step tap on 2nd step; 2x12; staircase in center of gym. Intermittent HHA on rails due to LLE fatigue. SBA.   STS with x1 airex pad under RLE to improve LLE strength: 1x10, 1x10, 1x10 Close SBA overall but CGA on last set with VC's for anterior weight shift to assist in standing due to LE fatigue.   Lateral L sided s6" step up: 2x10. 1x10 1ith BUE support needed and minA at L knee to maintain neutral position and to reach TKE.   Heel raises: unilateral 2x8 L LE, bilateral hand support on railing  Standing marches with 4# ankle weights 2 x 1 minute, PT used hand as target for L hip flexion; pt unilateral hand support on railing  Lateral side steps alternating hurdle heights. 5# AW's. X6 laps forwards, x6 laps lateral. Intermittent UE support needed on // bar for support. SBA.   Resisted walking laterally in // bars. X1 BTB. CGA, x5 laps R and L.      HOME EXERCISE PROGRAM: Access Code [email or web address]       ASSESSMENT:   CLINICAL IMPRESSION:  Continuing PT POC with LLE strength and and dynamic balance. Pt able to tolerate increased resistance (ankle weights) during standing LE strengthening exercises.  L LE does continue to fatigue/give out suddenly at knee/ankle with second/third sets of dynamic LE strengthening.  Pt able to regain balance with UE support, PT remained SBA intermittently for safety.  Pt will benefit from skilled physical therapy to address the noted  deficits in strength, weakness, and community-level mobility in order to increase safety with functional activities and allow pt to return to work and home duties.     OBJECTIVE IMPAIRMENTS Abnormal gait,  decreased activity tolerance, decreased balance, decreased endurance, decreased mobility, difficulty walking, decreased ROM, decreased strength, decreased safety awareness, impaired flexibility, impaired sensation, postural dysfunction, and pain.    ACTIVITY LIMITATIONS carrying, lifting, bending, sitting, standing, squatting, stairs, and transfers   PARTICIPATION LIMITATIONS: cleaning, laundry, driving, shopping, community activity, occupation, and yard work   PERSONAL FACTORS Time since onset of injury/illness/exacerbation and 1-2 comorbidities: schizophrenia and anxiety  are also affecting patient's functional outcome.    REHAB POTENTIAL: Good   CLINICAL DECISION MAKING: Evolving/moderate complexity   EVALUATION COMPLEXITY: Moderate     GOALS: Goals reviewed with patient? Yes   SHORT TERM GOALS: Target date: 01/21/2022   Patient will be independent in home exercise program to improve strength/mobility for better functional independence with ADLs. Baseline:   02/11/22: Pt is compliant with his HEP  Goal status: ACHIEVED   2.  Pt will ambulate 200+ feet using RW without LOB or LLE buckling to demonstrate progression towards improved ambulatory endurance.  Baseline: 135ft .    02/11/22: Pt able to ambulate > 200 feet without LLE buckling Goal status: ACHIEVED   3.   Patient will deny any falls over past 4 weeks to demonstrate improved safety at home.  Baseline: 4 falls since surgery.   02/11/22: 2 falls in June.   03/04/22: No falls in previous month.  03/25/22: Pt last had fall in home 03/22/22.  04/15/22: One fall in home about 3.5 weeks ago.   Goal status: NOT MET      LONG TERM GOALS: Target date: 03/04/2022   Patient will increase FOTO score to equal to or greater than 41 to  demonstrate statistically significant improvement in mobility and quality of life.  Baseline: 01/07/22: 23/41.  7/11/2: 44/41.  04/15/22: 52/41 Goal status: ACHIEVED   2.   Patient (< 51 years old) will complete five times sit to stand test in < 10 seconds indicating an increased LE strength and improved balance. Baseline: 01/07/22: 43 seconds.   02/11/22: 36.1 sec.  03/04/22: 27.4 seconds.    03/18/22: 21.6 seconds.  04/15/22: 16 seconds.  Goal status: IN PROGRESS   3.  Patient will increase 10 meter walk test to >1.53m/s as to improve gait speed for better community ambulation and to reduce fall risk. Baseline: 01/07/22: 0.41 m/s.   02/11/22: 0.65 m/s.    03/04/22: 0.65 m/s fast speed   03/18/22: 0.90 m/s fast speed.   04/15/22: 0.90 m/s Goal status: IN PROGRESS   4.  Patient will increase six minute walk test distance to >1000 for progression to community ambulator and improve gait ability. Baseline: 01/14/22: 280 feet.  02/11/22: 600 feet.   03/04/22: 735 feet.   03/18/22: 840 feet.   04/15/22: 840 feet.  Goal status: IN PROGRESS   5.  Patient will increase LLE gross strength to 4/5 as to improve functional strength for independent gait, increased standing tolerance and increased ADL ability. Baseline: 01/07/22: ranging 3-4/5, please see objective.   02/11/22: 4 to 4+/5 MMTs, see above.  Goal status: ACHIEVED         PLAN: PT FREQUENCY: 2x/week   PT DURATION: 4-5 weeks   PLANNED INTERVENTIONS: Therapeutic exercises, Therapeutic activity, Neuromuscular re-education, Balance training, Gait training, Patient/Family education, Joint mobilization, Stair training, DME instructions, Dry Needling, Cryotherapy, Moist heat, and Manual therapy.   PLAN FOR NEXT SESSION: LE strengthening, trunk stabilization, LE flexibility. Progress with increasing emphasis on standing/weightbearing activity and continued AD wean as able from [facility] to no AD. Modalities  and manual therapy for pain modulation as needed. Continue PT with  increasing exercise intensity as able with careful CGA/SBA to prevent fall.  Recommend continued PT 2x/week for 4 weeks    Valentina Gu, PT, DPT [phone] 3:47 PM 05/06/2022

## 2022-05-07 ENCOUNTER — Encounter: Payer: Self-pay | Admitting: Physical Therapy

## 2022-05-07 ENCOUNTER — Ambulatory Visit: Payer: Medicare Other | Admitting: Physical Therapy

## 2022-05-07 DIAGNOSIS — M6281 Muscle weakness (generalized): Secondary | ICD-10-CM | POA: Diagnosis not present

## 2022-05-07 DIAGNOSIS — M545 Low back pain, unspecified: Secondary | ICD-10-CM

## 2022-05-07 DIAGNOSIS — R2681 Unsteadiness on feet: Secondary | ICD-10-CM

## 2022-05-07 DIAGNOSIS — R269 Unspecified abnormalities of gait and mobility: Secondary | ICD-10-CM

## 2022-05-07 DIAGNOSIS — Z981 Arthrodesis status: Secondary | ICD-10-CM

## 2022-05-08 ENCOUNTER — Encounter: Payer: Medicare Other | Admitting: Physical Therapy

## 2022-05-12 ENCOUNTER — Ambulatory Visit: Payer: Medicare Other | Admitting: Physical Therapy

## 2022-05-12 ENCOUNTER — Encounter: Payer: Self-pay | Admitting: Physical Therapy

## 2022-05-12 DIAGNOSIS — M6281 Muscle weakness (generalized): Secondary | ICD-10-CM

## 2022-05-12 DIAGNOSIS — M545 Low back pain, unspecified: Secondary | ICD-10-CM

## 2022-05-12 DIAGNOSIS — R2681 Unsteadiness on feet: Secondary | ICD-10-CM

## 2022-05-12 DIAGNOSIS — Z981 Arthrodesis status: Secondary | ICD-10-CM

## 2022-05-12 DIAGNOSIS — R269 Unspecified abnormalities of gait and mobility: Secondary | ICD-10-CM

## 2022-05-12 NOTE — Therapy (Signed)
OUTPATIENT PHYSICAL THERAPY TREATMENT/GOAL UPDATE    Patient Name: Justin Nixon MRN: [id number] DOB:08/30/1975, 46 y.o., male Today's Date: 04/24/2022   END OF SESSION:   PT End of Session - 05/13/22 2204     Visit Number 36    Number of Visits 43    Date for PT Re-Evaluation 06/05/22    Progress Note Due on Visit 66    PT Start Time 0844    PT Stop Time 0926    PT Time Calculation (min) 42 min    Equipment Utilized During Treatment Gait belt    Activity Tolerance Patient tolerated treatment well    Behavior During Therapy WFL for tasks assessed/performed                  Past Medical History:  Diagnosis Date   Anxiety    Arthritis    GERD (gastroesophageal reflux disease)    H/O   Hyperlipidemia    Schizophrenia ([place])    Past Surgical History:  Procedure Laterality Date   ANTERIOR LATERAL LUMBAR FUSION WITH PERCUTANEOUS SCREW 1 LEVEL N/A 11/18/2021   Procedure: L4-5 LATERAL LUMBAR INTERBODY FUSION WITH POSTERIOR SPINAL FUSION;  Surgeon: Meade Maw, MD;  Location: [facility];  Service: Neurosurgery;  Laterality: N/A;   APPLICATION OF INTRAOPERATIVE CT SCAN N/A 11/18/2021   Procedure: APPLICATION OF INTRAOPERATIVE CT SCAN;  Surgeon: Meade Maw, MD;  Location: [facility];  Service: Neurosurgery;  Laterality: N/A;   COLONOSCOPY     CYST EXCISION     ELBOW ARTHROSCOPY Left 10/10/2020   Procedure: LEFT ELBOW ARTHROSCOPY WITH DEBRIDEMENT AND EXCISION OF LOOSE BODIES;  Surgeon: Corky Mull, MD;  Location: [facility];  Service: Orthopedics;  Laterality: Left;   MYRINGOTOMY WITH TUBE PLACEMENT     ORIF ELBOW FRACTURE Left 08/28/2021   Procedure: OPEN REDUCTION INTERNAL FIXATION (ORIF) ELBOW/OLECRANON FRACTURE;  Surgeon: Corky Mull, MD;  Location: [facility];  Service: Orthopedics;  Laterality: Left;   SHOULDER ARTHROSCOPY Left 03/18/2017   Procedure: ARTHROSCOPY SHOULDER with debridement decompression and distal clavicle excision;  Surgeon: Corky Mull, MD;   Location: [place];  Service: Orthopedics;  Laterality: Left;   SHOULDER ARTHROSCOPY WITH OPEN ROTATOR CUFF REPAIR Right 09/23/2016   Procedure: SHOULDER ARTHROSCOPY WITH OPEN ROTATOR CUFF REPAIR;  Surgeon: Corky Mull, MD;  Location: [facility];  Service: Orthopedics;  Laterality: Right;   SHOULDER ARTHROSCOPY WITH SUBACROMIAL DECOMPRESSION Right 09/23/2016   Procedure: SHOULDER ARTHROSCOPY WITH SUBACROMIAL DECOMPRESSION AND DEBRIDEMENT;  Surgeon: Corky Mull, MD;  Location: [facility];  Service: Orthopedics;  Laterality: Right;   Patient Active Problem List   Diagnosis Date Noted   S/P spinal fusion 11/18/2021   Olecranon fracture, left, open type I or II, initial encounter 08/28/2021      PCP: Baxter Hire MD REFERRING PROVIDER: Meade Maw, MD     REFERRING DIAG: Z98.1 (ICD-10-CM) - History of lumbar fusion   THERAPY DIAG:  Muscle weakness (generalized)   S/P lumbar fusion   Abnormality of gait and mobility   Unsteadiness on feet   Rationale for Evaluation and Treatment Rehabilitation   PERTINENT HISTORY: Pt presents to physical therapy for evaluation of low back and LE pain and weakness L>R, following L4-L5 XLIF/PSF on 11/18/21. Pt states he was using a cane before surgery due to onset of LLE weakness. States he has now been using a RW since surgery. Pain ranges from a 5/10 to a 10/10. When aggravated pain is described as sharp  and shooting into LLE; at baseline, pain is achy. Wearing clothes such as jeans is painful due to sensitivity of skin; at home wears pajamas. Pt has not returned to driving; he is limited to household distance ambulation or maximum into a doctors office. He feels as though his LLE is going to "give out" when he is walking or weightbearing. 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 Reports frequent falling since surgery due to LLE  giving out; no injuries associated with falls.    PRECAUTIONS: None at this time following 03/13/22 visit with Dr. Izora Ribas     OBJECTIVE: (objective measures completed at initial evaluation unless otherwise dated)   DIAGNOSTIC FINDINGS:  "Intraoperative images during L4-L5 anterior and posterior fusion. No evidence of immediate complication"   PATIENT SURVEYS:  Modified Oswestry (updated 01/09/22 due to incomplete form): 62%.   02/11/22: 48%.  03/04/22: 28% FOTO: eval 23/41,   02/11/22: 44/41   SCREENING FOR RED FLAGS: Bowel or bladder incontinence: No Spinal tumors: No Cauda equina syndrome: No Compression fracture: No Abdominal aneurysm: No   COGNITION:           Overall cognitive status: Within functional limits for tasks assessed                          SENSATION: Light touch: Impaired  - increased sensitivity in left L2 and L3 dermatomes    MUSCLE LENGTH: Hamstrings: unable to test due to increase in sharp/shooting pain in hip flexors prior to HS stretch being felt.  (01/28/22): R lacking 30 deg, L lacking 40 deg.   Thomas test: NT   POSTURE: decreased lumbar lordosis and posterior pelvic tilt   PALPATION: Right: hip flexors and lateral hip, glute med, piriformis, deep external rotators, QL Left: hip flexors, lateral hip and adductors (from proximal hip to mid-thigh), proximal hamstrings, L4-distal paraspinals, QL, all gluteals   OBSERVATION 01/21/22: No bruising, ecchymosis, increased tissue temperature, or erythema observed along L flank, L gluteal region, or adjacent to surgical incisions. No sign of infection or delayed healing   LUMBAR ROM: not assessed due to back precautions    Active  A/PROM  eval  Flexion -  Extension -  Right lateral flexion -  Left lateral flexion -  Right rotation -  Left rotation -   (Blank rows = not tested)   LOWER EXTREMITY ROM:      Passive  Right eval Left eval  Hip flexion 100 90  Hip extension NT NT  Hip abduction 20 20   Hip adduction      Hip internal rotation      Hip external rotation      Knee flexion [facility]  Knee extension [facility]  Ankle dorsiflexion [facility]  Ankle plantarflexion      Ankle inversion      Ankle eversion       (Blank rows = not tested)   LOWER EXTREMITY MMT:     MMT Right eval Left eval Right 02/11/22 Left 02/11/22 Right 05/12/22 Left 05/12/22  Hip flexion 4-* 4-* 4+ 4* 5 4+  Hip extension            Hip abduction 4+* 4-* 4+ 4+    Hip adduction 4* 4* 4+ 4+ 5 4+  Hip internal rotation 5 3* 5 4* 4+* 4+  Hip external rotation 5 4* 5 4*  5 4+  Knee flexion 5 4* 5 4+ 5 5-  Knee extension 5 4+ _0 4+  Ankle dorsiflexion 4+ 3+ _1 4+  Ankle plantarflexion             (Blank rows = not tested)     FUNCTIONAL TESTS:  5 times sit to stand: 43.37 seconds using arms.    02/11/22: 36.1 seconds.  03/04/22: 27.4 seconds.  6 minute walk test (performed 01/14/22): 280 feet.     02/11/22: 600 feet.  03/04/22: 735 feet. 10 meter walk test: 24.3 seconds (comfortable) = 0.41 m/s using RW; terminated fast-pace for safety.   02/11/22: 0.64 m/s comfortable speed, 0.65 m/s fast speed.  03/04/22: 0.65 m/s fast speed   STS mechanics: no UE support, use of momentum with rapid hip hinge to initiate sit to stand. Intermittent LLE buckling and varus/valgus immediately following initiation of sit to stand             -03/04/22: After 2 attempts pt able to perform sit to stand with no UE support, upward velocity of transfers slows down halfway through transfer with intermittent LLE varus/valgus   GAIT: 03/04/22: Distance walked: 735 feet Assistive device utilized: [facility] Level of assistance: standby assist Comments: good heel to toe pattern, intermittent LLE buckling requiring pt holding wall and intermittently placing hands on his L thigh for support; no assist required from PT to prevent fall     Injury screen 04/08/22 Homan's sign: Negative. Ankle inversion and eversion stress:  Negative Dorsiflexion-eversion test: Negative  No significant color change, erythema, ecchymosis, venous distension, or girth asymmetry along calf between R and LLE. Pt has non-pitting edema along distal tibia medially       TODAY'S TREATMENT    SUBJECTIVE: Patient reports reports 80% SANE score. 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 Patient reports paresthesia affecting R toe. Patient reports NT in all of his fingers on R side which he has experienced transiently. Patient reports ongoing difficulty with riding in his car.   PAIN:  Are you having pain? No Pain location: -       Therapeutic Exercise - for improved soft tissue flexibility and extensibility as needed for ROM, improved strength as needed to improve performance of CKC activities/functional movements and prevention of fall during episode of LOB, progressive standing/weightbearing activity to improve capacity for community-level mobility with decreasing reliance on AD/external support.     GOAL UPDATE PERFORMED  SciFit seated elliptical; Level 7; seat at 10, 6 minutes - subjective information gathered during this time  Forward step up to step tap on 2nd step; 2x10; staircase in center of gym. First set with no UE support,  SBA.   STS with x1 airex pad under RLE to improve LLE strength: 2x8 with 6-lb medicine ball;  Close SBA overall but CGA on second set with VC's for anterior weight shift to assist in standing due to LE fatigue.   Heel raises: unilateral 2x8 L LE, bilateral hand support on railing    *next visit*  Total Gym; Level 22; L single-leg minisquat; 1x10, 1x5, 1x5  (completed "drop set" strategy for 3 sets due to L quad fatigability)  Resisted walking forward and laterally in // bars. X2 Black  resistance tubing. CGA, x5 laps each  direction    *not today* Lateral L sided 6" step up: 1x12, 1x10 Lateral side steps alternating hurdle heights. 5# AW's. X6 laps forwards, x6 laps lateral. Intermittent UE support needed on // bar for support. SBA.  Standing marches with 4# ankle weights 2 x 1 minute, PT used hand as target for L hip flexion; pt unilateral hand support on railing     HOME EXERCISE PROGRAM: Access Code [email or web address]       ASSESSMENT:   CLINICAL IMPRESSION: Pt has met short-term goal related to no significant falls for 4-week period of time. He has improved all performance-based goals, but pt has not yet met established long-term score for 10-meter walk test, 6-minute walk test, or 5-times sit to stand. Pt demonstrates improved MMTs compared to earlier in plan of care. He is continuing with core strengthening, neurodynamics, and hip stretching as part of his HEP along with desensitization of L hip/thigh with various textures. PT in the clinic has focused primarily on restoration of function and strengthening of LLE to improve motor recruitment and reduce incidence of LLE buckling during gait. Pt has made substantial progress since outset of PT, but full restoration of function has been slow over last 3 months. Manual therapy has been used intermittently during episodes of heightened pain, but pt has generally reported no pain or low level of pain over previous month. Pt has remaining deficits in decreased LLE strength, intermittent gait instability with buckling of L knee, L flank and hip pain that is intermittent, L hip and lateral thigh sensitivity. Pt will continue to benefit from skilled PT services to address his noted deficits and improve function.     OBJECTIVE IMPAIRMENTS Abnormal gait, decreased activity tolerance, decreased balance, decreased endurance, decreased mobility, difficulty walking, decreased ROM, decreased strength, decreased safety awareness, impaired  flexibility, impaired sensation, postural dysfunction, and pain.    ACTIVITY LIMITATIONS carrying, lifting, bending, sitting, standing, squatting, stairs, and transfers   PARTICIPATION LIMITATIONS: cleaning, laundry, driving, shopping, community activity, occupation, and yard work   PERSONAL FACTORS Time since onset of injury/illness/exacerbation and 1-2 comorbidities: schizophrenia and anxiety  are also affecting patient's functional outcome.    REHAB POTENTIAL: Good   CLINICAL DECISION MAKING: Evolving/moderate complexity   EVALUATION COMPLEXITY: Moderate     GOALS: Goals reviewed with patient? Yes   SHORT TERM GOALS: Target date: 01/21/2022   Patient will be independent in home exercise program to improve strength/mobility for better functional independence with ADLs. Baseline:   02/11/22: Pt is compliant with his HEP  Goal status: ACHIEVED   2.  Pt will ambulate 200+ feet using RW without LOB or LLE buckling to demonstrate progression towards improved ambulatory endurance.  Baseline: 165f .    02/11/22: Pt able to ambulate > 200 feet without LLE buckling Goal status: ACHIEVED   3.   Patient will deny any falls over past 4 weeks to demonstrate improved safety at home.  Baseline: 4 falls since surgery.   02/11/22: 2 falls in June.   03/04/22: No falls in previous month.  03/25/22: Pt last had fall in home 03/22/22.  04/15/22: One fall in home about 3.5 weeks ago.  05/12/22: No falls since 03/22/22  Goal status: ACHIEVED     LONG TERM GOALS: Target date: 03/04/2022   Patient will increase FOTO score to equal to or greater than 41 to demonstrate statistically significant improvement in mobility and quality of life.  Baseline: 01/07/22: 23/41.  7/11/2: 44/41.  04/15/22: 52/41 Goal status: ACHIEVED  2.   Patient (< 60 years old) will complete five times sit to stand test in < 10 seconds indicating an increased LE strength and improved balance. Baseline: 01/07/22: 43 seconds.   02/11/22: 36.1  sec.  03/04/22: 27.4 seconds.    03/18/22: 21.6 seconds.  04/15/22: 16 seconds.    05/12/22: 16.9 sec Goal status: IN PROGRESS   3.  Patient will increase 10 meter walk test to >1.48ms as to improve gait speed for better community ambulation and to reduce fall risk. Baseline: 01/07/22: 0.41 m/s.   02/11/22: 0.65 m/s.    03/04/22: 0.65 m/s fast speed   03/18/22: 0.90 m/s fast speed.   04/15/22: 0.90 m/s 05/12/22: 0.96 m/s Goal status: IN PROGRESS   4.  Patient will increase six minute walk test distance to >1000 for progression to community ambulator and improve gait ability. Baseline: 01/14/22: 280 feet.  02/11/22: 600 feet.   03/04/22: 735 feet.   03/18/22: 840 feet.   04/15/22: 840 feet.  05/12/22: 875 feet  Goal status: IN PROGRESS   5.  Patient will increase LLE gross strength to 4/5 as to improve functional strength for independent gait, increased standing tolerance and increased ADL ability. Baseline: 01/07/22: ranging 3-4/5, please see objective.   02/11/22: 4 to 4+/5 MMTs, see above.  05/12/22: 4+ to 5/5 MMTs, see chart above Goal status: ACHIEVED         PLAN: PT FREQUENCY: 2x/week   PT DURATION: 4-5 weeks   PLANNED INTERVENTIONS: Therapeutic exercises, Therapeutic activity, Neuromuscular re-education, Balance training, Gait training, Patient/Family education, Joint mobilization, Stair training, DME instructions, Dry Needling, Cryotherapy, Moist heat, and Manual therapy.   PLAN FOR NEXT SESSION: LE strengthening, trunk stabilization, LE flexibility. Progress with increasing emphasis on standing/weightbearing activity and continued [facility]. Modalities and manual therapy for pain modulation as needed. Continue PT with increasing exercise intensity as able with careful CGA/SBA to prevent fall.      JValentina Gu PT, DPT ##[id number] JEilleen Kempf10:04 PM 05/13/2022

## 2022-05-13 ENCOUNTER — Encounter: Payer: Medicare Other | Admitting: Physical Therapy

## 2022-05-14 ENCOUNTER — Ambulatory Visit: Payer: Medicare Other | Admitting: Physical Therapy

## 2022-05-14 ENCOUNTER — Encounter: Payer: Self-pay | Admitting: Physical Therapy

## 2022-05-14 DIAGNOSIS — R269 Unspecified abnormalities of gait and mobility: Secondary | ICD-10-CM

## 2022-05-14 DIAGNOSIS — Z981 Arthrodesis status: Secondary | ICD-10-CM

## 2022-05-14 DIAGNOSIS — R2681 Unsteadiness on feet: Secondary | ICD-10-CM

## 2022-05-14 DIAGNOSIS — M6281 Muscle weakness (generalized): Secondary | ICD-10-CM

## 2022-05-14 DIAGNOSIS — M545 Low back pain, unspecified: Secondary | ICD-10-CM

## 2022-05-14 NOTE — Therapy (Signed)
OUTPATIENT PHYSICAL THERAPY TREATMENT    Patient Name: Justin Nixon MRN: [id number] DOB:February 25, 1976, 46 y.o., male Today's Date: 04/24/2022   END OF SESSION:   PT End of Session - 05/14/22 1113     Visit Number 37    Number of Visits 43    Date for PT Re-Evaluation 06/05/22    Progress Note Due on Visit 40    PT Start Time 1109    PT Stop Time 1151    PT Time Calculation (min) 42 min    Equipment Utilized During Treatment Gait belt    Activity Tolerance Patient tolerated treatment well    Behavior During Therapy WFL for tasks assessed/performed                   Past Medical History:  Diagnosis Date   Anxiety    Arthritis    GERD (gastroesophageal reflux disease)    H/O   Hyperlipidemia    Schizophrenia (HCC)    Past Surgical History:  Procedure Laterality Date   ANTERIOR LATERAL LUMBAR FUSION WITH PERCUTANEOUS SCREW 1 LEVEL N/A 11/18/2021   Procedure: L4-5 LATERAL LUMBAR INTERBODY FUSION WITH POSTERIOR SPINAL FUSION;  Surgeon: Venetia Night, MD;  Location: [facility];  Service: Neurosurgery;  Laterality: N/A;   APPLICATION OF INTRAOPERATIVE CT SCAN N/A 11/18/2021   Procedure: APPLICATION OF INTRAOPERATIVE CT SCAN;  Surgeon: Venetia Night, MD;  Location: [facility];  Service: Neurosurgery;  Laterality: N/A;   COLONOSCOPY     CYST EXCISION     ELBOW ARTHROSCOPY Left 10/10/2020   Procedure: LEFT ELBOW ARTHROSCOPY WITH DEBRIDEMENT AND EXCISION OF LOOSE BODIES;  Surgeon: Christena Flake, MD;  Location: [facility];  Service: Orthopedics;  Laterality: Left;   MYRINGOTOMY WITH TUBE PLACEMENT     ORIF ELBOW FRACTURE Left 08/28/2021   Procedure: OPEN REDUCTION INTERNAL FIXATION (ORIF) ELBOW/OLECRANON FRACTURE;  Surgeon: Christena Flake, MD;  Location: [facility];  Service: Orthopedics;  Laterality: Left;   SHOULDER ARTHROSCOPY Left 03/18/2017   Procedure: ARTHROSCOPY SHOULDER with debridement decompression and distal clavicle excision;  Surgeon: Christena Flake, MD;  Location:  [facility];  Service: Orthopedics;  Laterality: Left;   SHOULDER ARTHROSCOPY WITH OPEN ROTATOR CUFF REPAIR Right 09/23/2016   Procedure: SHOULDER ARTHROSCOPY WITH OPEN ROTATOR CUFF REPAIR;  Surgeon: Christena Flake, MD;  Location: [facility];  Service: Orthopedics;  Laterality: Right;   SHOULDER ARTHROSCOPY WITH SUBACROMIAL DECOMPRESSION Right 09/23/2016   Procedure: SHOULDER ARTHROSCOPY WITH SUBACROMIAL DECOMPRESSION AND DEBRIDEMENT;  Surgeon: Christena Flake, MD;  Location: [facility];  Service: Orthopedics;  Laterality: Right;   Patient Active Problem List   Diagnosis Date Noted   S/P spinal fusion 11/18/2021   Olecranon fracture, left, open type I or II, initial encounter 08/28/2021      PCP: Gracelyn Nurse MD REFERRING PROVIDER: Venetia Night, MD     REFERRING DIAG: Z98.1 (ICD-10-CM) - History of lumbar fusion   THERAPY DIAG:  Muscle weakness (generalized)   S/P lumbar fusion   Abnormality of gait and mobility   Unsteadiness on feet   Rationale for Evaluation and Treatment Rehabilitation   PERTINENT HISTORY: Pt presents to physical therapy for evaluation of low back and LE pain and weakness L>R, following L4-L5 XLIF/PSF on 11/18/21. Pt states he was using a cane before surgery due to onset of LLE weakness. States he has now been using a RW since surgery. Pain ranges from a 5/10 to a 10/10. When aggravated pain is described as sharp  and shooting into LLE; at baseline, pain is achy. Wearing clothes such as jeans is painful due to sensitivity of skin; at home wears pajamas. Pt has not returned to driving; he is limited to household distance ambulation or maximum into a doctors office. He feels as though his LLE is going to "give out" when he is walking or weightbearing. 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 Reports frequent falling since surgery due to LLE giving out; no  injuries associated with falls.    PRECAUTIONS: None at this time following 03/13/22 visit with Dr. Myer Haff     OBJECTIVE: (objective measures completed at initial evaluation unless otherwise dated)   DIAGNOSTIC FINDINGS:  "Intraoperative images during L4-L5 anterior and posterior fusion. No evidence of immediate complication"   PATIENT SURVEYS:  Modified Oswestry (updated 01/09/22 due to incomplete form): 62%.   02/11/22: 48%.  03/04/22: 28% FOTO: eval 23/41,   02/11/22: 44/41   SCREENING FOR RED FLAGS: Bowel or bladder incontinence: No Spinal tumors: No Cauda equina syndrome: No Compression fracture: No Abdominal aneurysm: No   COGNITION:           Overall cognitive status: Within functional limits for tasks assessed                          SENSATION: Light touch: Impaired  - increased sensitivity in left L2 and L3 dermatomes    MUSCLE LENGTH: Hamstrings: unable to test due to increase in sharp/shooting pain in hip flexors prior to HS stretch being felt.  (01/28/22): R lacking 30 deg, L lacking 40 deg.   Thomas test: NT   POSTURE: decreased lumbar lordosis and posterior pelvic tilt   PALPATION: Right: hip flexors and lateral hip, glute med, piriformis, deep external rotators, QL Left: hip flexors, lateral hip and adductors (from proximal hip to mid-thigh), proximal hamstrings, L4-distal paraspinals, QL, all gluteals   OBSERVATION 01/21/22: No bruising, ecchymosis, increased tissue temperature, or erythema observed along L flank, L gluteal region, or adjacent to surgical incisions. No sign of infection or delayed healing   LUMBAR ROM: not assessed due to back precautions    Active  A/PROM  eval  Flexion -  Extension -  Right lateral flexion -  Left lateral flexion -  Right rotation -  Left rotation -   (Blank rows = not tested)   LOWER EXTREMITY ROM:      Passive  Right eval Left eval  Hip flexion 100 90  Hip extension NT NT  Hip abduction 20 20  Hip adduction       Hip internal rotation      Hip external rotation      Knee flexion [facility]  Knee extension [facility]  Ankle dorsiflexion [facility]  Ankle plantarflexion      Ankle inversion      Ankle eversion       (Blank rows = not tested)   LOWER EXTREMITY MMT:     MMT Right eval Left eval Right 02/11/22 Left 02/11/22 Right 05/12/22 Left 05/12/22  Hip flexion 4-* 4-* 4+ 4* 5 4+  Hip extension            Hip abduction 4+* 4-* 4+ 4+    Hip adduction 4* 4* 4+ 4+ 5 4+  Hip internal rotation 5 3* 5 4* 4+* 4+  Hip external rotation 5 4* 5 4*  5 4+  Knee flexion 5 4* 5 4+ 5 5-  Knee extension 5 4+ 5 4 5  4+  Ankle dorsiflexion 4+ 3+ 5 4 5  4+  Ankle plantarflexion             (Blank rows = not tested)     FUNCTIONAL TESTS:  5 times sit to stand: 43.37 seconds using arms.    02/11/22: 36.1 seconds.  03/04/22: 27.4 seconds.  6 minute walk test (performed 01/14/22): 280 feet.     02/11/22: 600 feet.  03/04/22: 735 feet. 10 meter walk test: 24.3 seconds (comfortable) = 0.41 m/s using RW; terminated fast-pace for safety.   02/11/22: 0.64 m/s comfortable speed, 0.65 m/s fast speed.  03/04/22: 0.65 m/s fast speed   STS mechanics: no UE support, use of momentum with rapid hip hinge to initiate sit to stand. Intermittent LLE buckling and varus/valgus immediately following initiation of sit to stand             -03/04/22: After 2 attempts pt able to perform sit to stand with no UE support, upward velocity of transfers slows down halfway through transfer with intermittent LLE varus/valgus   GAIT: 03/04/22: Distance walked: 735 feet [facility] Level of assistance: standby assist Comments: good heel to toe pattern, intermittent LLE buckling requiring pt holding wall and intermittently placing hands on his L thigh for support; no assist required from PT to prevent fall     Injury screen 04/08/22 Homan's sign: Negative. Ankle inversion and eversion stress: Negative Dorsiflexion-eversion test: Negative   No significant color change, erythema, ecchymosis, venous distension, or girth asymmetry along calf between R and LLE. 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 Patient reports intermittent swelling in his L ankle that is managed with elevation/compression. Patient reports intermittent LLE weakness/giving way, but no recent falls or near-falls.   PAIN:  Are you having pain? No Pain location: -       Therapeutic Exercise - for improved soft tissue flexibility and extensibility as needed for ROM, improved strength as needed to improve performance of CKC activities/functional movements and prevention of fall during episode of LOB, progressive standing/weightbearing activity to improve capacity for community-level mobility with decreasing reliance on AD/external support.  SciFit seated elliptical; Level 6.5; seat at 10, 6 minutes - subjective information gathered during this time  On blue agility ladder: high knee with opposite knee tap 2x D/B, gate opener 2x D/B  Forward lunge, adjacent to treadmill arm support for UE assist prn; 1x10 -Modified to forward mini-lunge; performed 1x10   Forward step up to step tap on 2nd step; 3x7; staircase in center of gym. Performed with 7-lb Dbell bilat UE   STS with x1 airex pad under RLE to improve LLE strength: 2x8 with 6-lb medicine ball;  Close SBA overall but CGA on second set with VC's for anterior weight shift to assist in standing due to LE fatigue.   Heel raises: unilateral 1x15, 1x8 L LE, bilateral hand support on railing  Total Gym; Level 22; L single-leg minisquat; 1x10, 1x8, 1x8 (completed "drop set" strategy for 3 sets due to L quad fatigability)   Unipedal stance on foam x multiple attempts Unipedal stance on floor 3x15 sec    *not today* Resisted walking forward and laterally in //  bars. X2 Black resistance tubing. CGA,  x5 laps each direction  Lateral L sided 6" step up: 1x12, 1x10 Lateral side steps alternating hurdle heights. 5# AW's. X6 laps forwards, x6 laps lateral. Intermittent UE support needed on // bar for support. SBA.  Standing marches with 4# ankle weights 2 x 1 minute, PT used hand as target for L hip flexion; pt unilateral hand support on railing     HOME EXERCISE PROGRAM: Access Code [email or web address]       ASSESSMENT:   CLINICAL IMPRESSION: Pt is able to continue progressive strengthening for LLE and BLE with continued closed-chain drills and increasing emphasis on unilateral weightbearing. Pt fortunately reports minimal pain with PT participation, but he is still markedly limited with completing his former occupation and tolerating trips in car. Pt does have intermittent LLE buckling with prolonged weightbearing with apparent LLE motor weakness. Will continue strengthening and focusing on recovery of function as tolerated. Pt has remaining deficits in decreased LLE strength, intermittent gait instability with buckling of L knee, L flank and hip pain that is intermittent, L hip and lateral thigh sensitivity. Pt will continue to benefit from skilled PT services to address his noted deficits and improve function.     OBJECTIVE IMPAIRMENTS Abnormal gait, decreased activity tolerance, decreased balance, decreased endurance, decreased mobility, difficulty walking, decreased ROM, decreased strength, decreased safety awareness, impaired flexibility, impaired sensation, postural dysfunction, and pain.    ACTIVITY LIMITATIONS carrying, lifting, bending, sitting, standing, squatting, stairs, and transfers   PARTICIPATION LIMITATIONS: cleaning, laundry, driving, shopping, community activity, occupation, and yard work   PERSONAL FACTORS Time since onset of injury/illness/exacerbation and 1-2 comorbidities: schizophrenia and anxiety  are also affecting patient's functional  outcome.    REHAB POTENTIAL: Good   CLINICAL DECISION MAKING: Evolving/moderate complexity   EVALUATION COMPLEXITY: Moderate     GOALS: Goals reviewed with patient? Yes   SHORT TERM GOALS: Target date: 01/21/2022   Patient will be independent in home exercise program to improve strength/mobility for better functional independence with ADLs. Baseline:   02/11/22: Pt is compliant with his HEP  Goal status: ACHIEVED   2.  Pt will ambulate 200+ feet using RW without LOB or LLE buckling to demonstrate progression towards improved ambulatory endurance.  Baseline: 163ft .    02/11/22: Pt able to ambulate > 200 feet without LLE buckling Goal status: ACHIEVED   3.   Patient will deny any falls over past 4 weeks to demonstrate improved safety at home.  Baseline: 4 falls since surgery.   02/11/22: 2 falls in June.   03/04/22: No falls in previous month.  03/25/22: Pt last had fall in home 03/22/22.  04/15/22: One fall in home about 3.5 weeks ago.  05/12/22: No falls since 03/22/22  Goal status: ACHIEVED     LONG TERM GOALS: Target date: 03/04/2022   Patient will increase FOTO score to equal to or greater than 41 to demonstrate statistically significant improvement in mobility and quality of life.  Baseline: 01/07/22: 23/41.  7/11/2: 44/41.  04/15/22: 52/41 Goal status: ACHIEVED   2.   Patient (< 62 years old) will complete five times sit to stand test in < 10 seconds indicating an increased LE strength and improved balance. Baseline: 01/07/22: 43 seconds.   02/11/22: 36.1 sec.  03/04/22: 27.4 seconds.    03/18/22: 21.6 seconds.  04/15/22: 16 seconds.    05/12/22: 16.9 sec Goal status: IN PROGRESS   3.  Patient will increase 10 meter walk test to >1.51m/s as to improve gait speed for better  community ambulation and to reduce fall risk. Baseline: 01/07/22: 0.41 m/s.   02/11/22: 0.65 m/s.    03/04/22: 0.65 m/s fast speed   03/18/22: 0.90 m/s fast speed.   04/15/22: 0.90 m/s 05/12/22: 0.96 m/s Goal status: IN PROGRESS    4.  Patient will increase six minute walk test distance to >1000 for progression to community ambulator and improve gait ability. Baseline: 01/14/22: 280 feet.  02/11/22: 600 feet.   03/04/22: 735 feet.   03/18/22: 840 feet.   04/15/22: 840 feet.  05/12/22: 875 feet  Goal status: IN PROGRESS   5.  Patient will increase LLE gross strength to 4/5 as to improve functional strength for independent gait, increased standing tolerance and increased ADL ability. Baseline: 01/07/22: ranging 3-4/5, please see objective.   02/11/22: 4 to 4+/5 MMTs, see above.  05/12/22: 4+ to 5/5 MMTs, see chart above Goal status: ACHIEVED         PLAN: PT FREQUENCY: 2x/week   PT DURATION: 4-5 weeks   PLANNED INTERVENTIONS: Therapeutic exercises, Therapeutic activity, Neuromuscular re-education, Balance training, Gait training, Patient/Family education, Joint mobilization, Stair training, DME instructions, Dry Needling, Cryotherapy, Moist heat, and Manual therapy.   PLAN FOR NEXT SESSION: LE strengthening, trunk stabilization, LE flexibility. Progress with increasing emphasis on standing/weightbearing activity and continued AD wean as able from [facility] to no AD. Modalities and manual therapy for pain modulation as needed. Continue PT with increasing exercise intensity as able with careful CGA/SBA to prevent fall.    Consuela Mimes, PT, DPT #[id number]  Gertie Exon 11:14 AM 05/14/2022

## 2022-05-15 ENCOUNTER — Encounter: Payer: Medicare Other | Admitting: Physical Therapy

## 2022-05-19 ENCOUNTER — Ambulatory Visit (INDEPENDENT_AMBULATORY_CARE_PROVIDER_SITE_OTHER): Payer: Medicare Other

## 2022-05-19 ENCOUNTER — Encounter: Payer: Self-pay | Admitting: Physical Therapy

## 2022-05-19 ENCOUNTER — Ambulatory Visit: Payer: Medicare Other | Admitting: Physical Therapy

## 2022-05-19 ENCOUNTER — Ambulatory Visit
Admission: EM | Admit: 2022-05-19 | Discharge: 2022-05-19 | Disposition: A | Discharge status: Home / Self Care | Payer: Medicare Other | Attending: Emergency Medicine | Admitting: Emergency Medicine

## 2022-05-19 DIAGNOSIS — R2681 Unsteadiness on feet: Secondary | ICD-10-CM

## 2022-05-19 DIAGNOSIS — Z981 Arthrodesis status: Secondary | ICD-10-CM

## 2022-05-19 DIAGNOSIS — K5900 Constipation, unspecified: Secondary | ICD-10-CM | POA: Diagnosis not present

## 2022-05-19 DIAGNOSIS — M6281 Muscle weakness (generalized): Secondary | ICD-10-CM

## 2022-05-19 DIAGNOSIS — M545 Low back pain, unspecified: Secondary | ICD-10-CM

## 2022-05-19 DIAGNOSIS — R140 Abdominal distension (gaseous): Secondary | ICD-10-CM | POA: Diagnosis not present

## 2022-05-19 DIAGNOSIS — R269 Unspecified abnormalities of gait and mobility: Secondary | ICD-10-CM

## 2022-05-19 MED ORDER — ONDANSETRON 8 MG PO TBDP: 8.0000 mg | ORAL_TABLET | Freq: Three times a day (TID) | ORAL | 0 refills | Status: DC | PRN

## 2022-05-19 NOTE — Therapy (Signed)
OUTPATIENT PHYSICAL THERAPY TREATMENT    Patient Name: Justin PatchKeith L Holstad MRN: [id number] DOB:12/14/1975, 46 y.o., male Today's Date: 04/24/2022   END OF SESSION:   PT End of Session - 05/19/22 1151     Visit Number 38    Number of Visits 43    Date for PT Re-Evaluation 06/05/22    Progress Note Due on Visit 40    PT Start Time 1147    PT Stop Time 1220    PT Time Calculation (min) 33 min    Equipment Utilized During Treatment Gait belt    Activity Tolerance Patient tolerated treatment well    Behavior During Therapy WFL for tasks assessed/performed                    Past Medical History:  Diagnosis Date   Anxiety    Arthritis    GERD (gastroesophageal reflux disease)    H/O   Hyperlipidemia    Schizophrenia (HCC)    Past Surgical History:  Procedure Laterality Date   ANTERIOR LATERAL LUMBAR FUSION WITH PERCUTANEOUS SCREW 1 LEVEL N/A 11/18/2021   Procedure: L4-5 LATERAL LUMBAR INTERBODY FUSION WITH POSTERIOR SPINAL FUSION;  Surgeon: Venetia NightYarbrough, Chester, MD;  Location: [facility];  Service: Neurosurgery;  Laterality: N/A;   APPLICATION OF INTRAOPERATIVE CT SCAN N/A 11/18/2021   Procedure: APPLICATION OF INTRAOPERATIVE CT SCAN;  Surgeon: Venetia NightYarbrough, Chester, MD;  Location: [facility];  Service: Neurosurgery;  Laterality: N/A;   COLONOSCOPY     CYST EXCISION     ELBOW ARTHROSCOPY Left 10/10/2020   Procedure: LEFT ELBOW ARTHROSCOPY WITH DEBRIDEMENT AND EXCISION OF LOOSE BODIES;  Surgeon: Christena FlakePoggi, John J, MD;  Location: [facility];  Service: Orthopedics;  Laterality: Left;   MYRINGOTOMY WITH TUBE PLACEMENT     ORIF ELBOW FRACTURE Left 08/28/2021   Procedure: OPEN REDUCTION INTERNAL FIXATION (ORIF) ELBOW/OLECRANON FRACTURE;  Surgeon: Christena FlakePoggi, John J, MD;  Location: [facility];  Service: Orthopedics;  Laterality: Left;   SHOULDER ARTHROSCOPY Left 03/18/2017   Procedure: ARTHROSCOPY SHOULDER with debridement decompression and distal clavicle excision;  Surgeon: Christena FlakePoggi, John J, MD;   [facility];  Service: Orthopedics;  Laterality: Left;   SHOULDER ARTHROSCOPY WITH OPEN ROTATOR CUFF REPAIR Right 09/23/2016   Procedure: SHOULDER ARTHROSCOPY WITH OPEN ROTATOR CUFF REPAIR;  Surgeon: Christena FlakeJohn J Poggi, MD;  Location: [facility];  Service: Orthopedics;  Laterality: Right;   SHOULDER ARTHROSCOPY WITH SUBACROMIAL DECOMPRESSION Right 09/23/2016   Procedure: SHOULDER ARTHROSCOPY WITH SUBACROMIAL DECOMPRESSION AND DEBRIDEMENT;  Surgeon: Christena FlakeJohn J Poggi, MD;  Location: [facility];  Service: Orthopedics;  Laterality: Right;   Patient Active Problem List   Diagnosis Date Noted   S/P spinal fusion 11/18/2021   Olecranon fracture, left, open type I or II, initial encounter 08/28/2021      PCP: Gracelyn NurseJohn D Johnston MD REFERRING PROVIDER: Venetia Nighthester Yarbrough, MD     REFERRING DIAG: Z98.1 (ICD-10-CM) - History of lumbar fusion   THERAPY DIAG:  Muscle weakness (generalized)   S/P lumbar fusion   Abnormality of gait and mobility   Unsteadiness on feet   Rationale for Evaluation and Treatment Rehabilitation   PERTINENT HISTORY: Pt presents to physical therapy for evaluation of low back and LE pain and weakness L>R, following L4-L5 XLIF/PSF on 11/18/21. Pt states he was using a cane before surgery due to onset of LLE weakness. States he has now been using a RW since surgery. Pain ranges from a 5/10 to a 10/10. When aggravated pain is described as  sharp and shooting into LLE; at baseline, pain is achy. Wearing clothes such as jeans is painful due to sensitivity of skin; at home wears pajamas. Pt has not returned to driving; he is limited to household distance ambulation or maximum into a doctors office. He feels as though his LLE is going to "give out" when he is walking or weightbearing. 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 Reports frequent falling since surgery due to LLE  giving out; no injuries associated with falls.    PRECAUTIONS: None at this time following 03/13/22 visit with Dr. Izora Ribas     OBJECTIVE: (objective measures completed at initial evaluation unless otherwise dated)   DIAGNOSTIC FINDINGS:  "Intraoperative images during L4-L5 anterior and posterior fusion. No evidence of immediate complication"   PATIENT SURVEYS:  Modified Oswestry (updated 01/09/22 due to incomplete form): 62%.   02/11/22: 48%.  03/04/22: 28% FOTO: eval 23/41,   02/11/22: 44/41   SCREENING FOR RED FLAGS: Bowel or bladder incontinence: No Spinal tumors: No Cauda equina syndrome: No Compression fracture: No Abdominal aneurysm: No   COGNITION:           Overall cognitive status: Within functional limits for tasks assessed                          SENSATION: Light touch: Impaired  - increased sensitivity in left L2 and L3 dermatomes    MUSCLE LENGTH: Hamstrings: unable to test due to increase in sharp/shooting pain in hip flexors prior to HS stretch being felt.  (01/28/22): R lacking 30 deg, L lacking 40 deg.   Thomas test: NT   POSTURE: decreased lumbar lordosis and posterior pelvic tilt   PALPATION: Right: hip flexors and lateral hip, glute med, piriformis, deep external rotators, QL Left: hip flexors, lateral hip and adductors (from proximal hip to mid-thigh), proximal hamstrings, L4-distal paraspinals, QL, all gluteals   OBSERVATION 01/21/22: No bruising, ecchymosis, increased tissue temperature, or erythema observed along L flank, L gluteal region, or adjacent to surgical incisions. No sign of infection or delayed healing   LUMBAR ROM: not assessed due to back precautions    Active  A/PROM  eval  Flexion -  Extension -  Right lateral flexion -  Left lateral flexion -  Right rotation -  Left rotation -   (Blank rows = not tested)   LOWER EXTREMITY ROM:      Passive  Right eval Left eval  Hip flexion 100 90  Hip extension NT NT  Hip abduction 20 20   Hip adduction      Hip internal rotation      Hip external rotation      Knee flexion [facility]  Knee extension [facility]  Ankle dorsiflexion [facility]  Ankle plantarflexion      Ankle inversion      Ankle eversion       (Blank rows = not tested)   LOWER EXTREMITY MMT:     MMT Right eval Left eval Right 02/11/22 Left 02/11/22 Right 05/12/22 Left 05/12/22  Hip flexion 4-* 4-* 4+ 4* 5 4+  Hip extension            Hip abduction 4+* 4-* 4+ 4+    Hip adduction 4* 4* 4+ 4+ 5 4+  Hip internal rotation 5 3* 5 4* 4+* 4+  Hip external rotation 5 4* 5  4* 5 4+  Knee flexion 5 4* 5 4+ 5 5-  Knee extension 5 4+ 5 4 5  4+  Ankle dorsiflexion 4+ 3+ 5 4 5  4+  Ankle plantarflexion             (Blank rows = not tested)     FUNCTIONAL TESTS:  5 times sit to stand: 43.37 seconds using arms.    02/11/22: 36.1 seconds.  03/04/22: 27.4 seconds.  6 minute walk test (performed 01/14/22): 280 feet.     02/11/22: 600 feet.  03/04/22: 735 feet. 10 meter walk test: 24.3 seconds (comfortable) = 0.41 m/s using RW; terminated fast-pace for safety.   02/11/22: 0.64 m/s comfortable speed, 0.65 m/s fast speed.  03/04/22: 0.65 m/s fast speed   STS mechanics: no UE support, use of momentum with rapid hip hinge to initiate sit to stand. Intermittent LLE buckling and varus/valgus immediately following initiation of sit to stand             -03/04/22: After 2 attempts pt able to perform sit to stand with no UE support, upward velocity of transfers slows down halfway through transfer with intermittent LLE varus/valgus   GAIT: 03/04/22: Distance walked: 735 feet Assistive device utilized: [facility] Level of assistance: standby assist Comments: good heel to toe pattern, intermittent LLE buckling requiring pt holding wall and intermittently placing hands on his L thigh for support; no assist required from PT to prevent fall     Injury screen 04/08/22 Homan's sign: Negative. Ankle inversion and eversion stress:  Negative Dorsiflexion-eversion test: Negative  No significant color change, erythema, ecchymosis, venous distension, or girth asymmetry along calf between R and LLE. Pt has non-pitting edema along distal tibia medially       TODAY'S TREATMENT    SUBJECTIVE: Patient reports no BM since this past Thursday. He reports episode of numbness in his R foot while standing in his kitchen - this did not resolve with stepping/walking immediately, but did resolve later in the day. He denies numbness at arrival. Pt denies notable abdominal pain or bloating recently.  PAIN:  Are you having pain? No Pain location: -   Body temperature: 98.2 deg F     Therapeutic Exercise - for improved soft tissue flexibility and extensibility as needed for ROM, improved strength as needed to improve performance of CKC activities/functional movements and prevention of fall during episode of LOB, progressive standing/weightbearing activity to improve capacity for community-level mobility with decreasing reliance on AD/external support.  SciFit seated elliptical; Level 6.5; seat at 10, 5 minutes - subjective information gathered during this time  On blue agility ladder: high knee with opposite knee tap 2x D/B, gate opener 3x D/B Open-gate, on blue agility ladder; 3x D/B  Forward mini-lunge, adjacent to treadmill arm support for UE assist prn; 2x10  Forward step up to step tap on 2nd step; 3x7; staircase in center of gym. Performed with 7-lb Dbell bilat UE   STS with x1 airex pad under RLE to improve LLE strength: 2x8 with 8-lb medicine ball;  Close SBA overall but CGA on second set with VC's for anterior weight shift to assist in standing due to LE fatigue.    *not today* Heel raises: unilateral 1x15, 1x8 L LE, bilateral hand support on railing [employer]; Level 22; L single-leg minisquat; 1x10, 1x8, 1x8 (completed "drop set" strategy for 3 sets due to L quad fatigability)  Unipedal stance on foam x multiple  attempts Unipedal stance on floor 3x15 sec    *  not today* Resisted walking forward and laterally in // bars. X2 Black resistance tubing. CGA, x5 laps each direction  Lateral L sided 6" step up: 1x12, 1x10 Lateral side steps alternating hurdle heights. 5# AW's. X6 laps forwards, x6 laps lateral. Intermittent UE support needed on // bar for support. SBA.  Standing marches with 4# ankle weights 2 x 1 minute, PT used hand as target for L hip flexion; pt unilateral hand support on railing     HOME EXERCISE PROGRAM: Access Code [email or web address]       ASSESSMENT:   CLINICAL IMPRESSION: Patient does have intermittent paresthesias affecting his R foot in spite of recent surgical management for lumbar spine. Pt has no Hx of DM or other risk factors for peripheral neuropathy. His symptoms are intermittent and will be monitored with future follow-ups. Of note, patient reports being on day 4 today without bowel movement and experiences 2 episodes of nausea and gagging in clinic today without vomiting; pt states he has had other episodes of this. Attempted to complete session today prior to episode of nausea/gagging given improved bowel motility expected with exercise and initial recommendation for discussion with PCP. However, given ongoing GI symptoms limiting participation today and significant nausea, I recommended f/u with urgent care to further investigate cause of GI symptoms and get appropriate medical management prn. Will resume plan of care pending resolution of current GI condition. Will continue strengthening and focusing on recovery of function as tolerated. Pt has remaining deficits in decreased LLE strength, intermittent gait instability with buckling of L knee, L flank and hip pain that is intermittent, L hip and lateral thigh sensitivity. Pt will continue to benefit from skilled PT services to address his noted deficits and improve function.     OBJECTIVE IMPAIRMENTS Abnormal gait, decreased  activity tolerance, decreased balance, decreased endurance, decreased mobility, difficulty walking, decreased ROM, decreased strength, decreased safety awareness, impaired flexibility, impaired sensation, postural dysfunction, and pain.    ACTIVITY LIMITATIONS carrying, lifting, bending, sitting, standing, squatting, stairs, and transfers   PARTICIPATION LIMITATIONS: cleaning, laundry, driving, shopping, community activity, occupation, and yard work   PERSONAL FACTORS Time since onset of injury/illness/exacerbation and 1-2 comorbidities: schizophrenia and anxiety  are also affecting patient's functional outcome.    REHAB POTENTIAL: Good   CLINICAL DECISION MAKING: Evolving/moderate complexity   EVALUATION COMPLEXITY: Moderate     GOALS: Goals reviewed with patient? Yes   SHORT TERM GOALS: Target date: 01/21/2022   Patient will be independent in home exercise program to improve strength/mobility for better functional independence with ADLs. Baseline:   02/11/22: Pt is compliant with his HEP  Goal status: ACHIEVED   2.  Pt will ambulate 200+ feet using RW without LOB or LLE buckling to demonstrate progression towards improved ambulatory endurance.  Baseline: 169ft .    02/11/22: Pt able to ambulate > 200 feet without LLE buckling Goal status: ACHIEVED   3.   Patient will deny any falls over past 4 weeks to demonstrate improved safety at home.  Baseline: 4 falls since surgery.   02/11/22: 2 falls in June.   03/04/22: No falls in previous month.  03/25/22: Pt last had fall in home 03/22/22.  04/15/22: One fall in home about 3.5 weeks ago.  05/12/22: No falls since 03/22/22  Goal status: ACHIEVED     LONG TERM GOALS: Target date: 03/04/2022   Patient will increase FOTO score to equal to or greater than 41 to demonstrate statistically significant improvement in mobility and quality of  life.  Baseline: 01/07/22: 23/41.  7/11/2: 44/41.  04/15/22: 52/41 Goal status: ACHIEVED   2.   Patient (< 60 years  old) will complete five times sit to stand test in < 10 seconds indicating an increased LE strength and improved balance. Baseline: 01/07/22: 43 seconds.   02/11/22: 36.1 sec.  03/04/22: 27.4 seconds.    03/18/22: 21.6 seconds.  04/15/22: 16 seconds.    05/12/22: 16.9 sec Goal status: IN PROGRESS   3.  Patient will increase 10 meter walk test to >1.29m/s as to improve gait speed for better community ambulation and to reduce fall risk. Baseline: 01/07/22: 0.41 m/s.   02/11/22: 0.65 m/s.    03/04/22: 0.65 m/s fast speed   03/18/22: 0.90 m/s fast speed.   04/15/22: 0.90 m/s 05/12/22: 0.96 m/s Goal status: IN PROGRESS   4.  Patient will increase six minute walk test distance to >1000 for progression to community ambulator and improve gait ability. Baseline: 01/14/22: 280 feet.  02/11/22: 600 feet.   03/04/22: 735 feet.   03/18/22: 840 feet.   04/15/22: 840 feet.  05/12/22: 875 feet  Goal status: IN PROGRESS   5.  Patient will increase LLE gross strength to 4/5 as to improve functional strength for independent gait, increased standing tolerance and increased ADL ability. Baseline: 01/07/22: ranging 3-4/5, please see objective.   02/11/22: 4 to 4+/5 MMTs, see above.  05/12/22: 4+ to 5/5 MMTs, see chart above Goal status: ACHIEVED         PLAN: PT FREQUENCY: 2x/week   PT DURATION: 4-5 weeks   PLANNED INTERVENTIONS: Therapeutic exercises, Therapeutic activity, Neuromuscular re-education, Balance training, Gait training, Patient/Family education, Joint mobilization, Stair training, DME instructions, Dry Needling, Cryotherapy, Moist heat, and Manual therapy.   PLAN FOR NEXT SESSION: LE strengthening, trunk stabilization, LE flexibility. Progress with increasing emphasis on standing/weightbearing activity and continued AD wean as able from [facility] to no AD. Modalities and manual therapy for pain modulation as needed. Continue PT with increasing exercise intensity as able with careful CGA/SBA to prevent fall.   F/u on medical  management plan for constipation/GI symptoms   Consuela Mimes, PT, DPT #[id number]  Gertie Exon 12:41 PM 05/19/2022

## 2022-05-19 NOTE — ED Triage Notes (Signed)
Patient reports that he has not had a BM since Thursday. Patient reports that he has been vomiting as well.   Patient reports that he has tried apple juice but has not helped.

## 2022-05-19 NOTE — Discharge Instructions (Addendum)
Your x-ray today showed that you have a moderate amount of stool throughout your colon including a large ball of stool in your rectal vault.  Please use over-the-counter MiraLAX and take a double dose (2 capfuls) and 16 ounces of a beverage of your choice.  I also want you to pick up an over-the-counter fleets mineral oil enema and administer to yourself.  This will help lubricate the stool in your rectum and help you to pass it.  If you do not have a resolution of your constipation, have increased abdominal distention, develop abdominal pain, or have increased nausea and vomiting despite the Zofran you need to go to the ER for evaluation.

## 2022-05-19 NOTE — ED Provider Notes (Signed)
[facility]    CSN: [id number] Arrival date & time: 05/19/22  1223      History   Chief Complaint Chief Complaint  Patient presents with   Constipation   Emesis    HPI ARAD BURSTON is a 46 y.o. male.   HPI  64 old male here for evaluation of constipation.  Patient reports that he has not been able to have a bowel movement for the last 4 days.  He is also been experiencing some left lower quadrant abdominal pain and abdominal distention.  Today he was at physical therapy, which he is going to for rehabilitation status post L4-5 spinal fusion in May, when he developed some vomiting for a clear bilious emesis.  He has not had any fever.  He is not currently taking any narcotic pain medication.  Past Medical History:  Diagnosis Date   Anxiety    Arthritis    GERD (gastroesophageal reflux disease)    H/O   Hyperlipidemia    Schizophrenia [facility])     Patient Active Problem List   Diagnosis Date Noted   S/P spinal fusion 11/18/2021   Olecranon fracture, left, open type I or II, initial encounter 08/28/2021    Past Surgical History:  Procedure Laterality Date   ANTERIOR LATERAL LUMBAR FUSION WITH PERCUTANEOUS SCREW 1 LEVEL N/A 11/18/2021   Procedure: L4-5 LATERAL LUMBAR INTERBODY FUSION WITH POSTERIOR SPINAL FUSION;  Surgeon: Venetia Night, MD;  Location: [facility];  Service: Neurosurgery;  Laterality: N/A;   APPLICATION OF INTRAOPERATIVE CT SCAN N/A 11/18/2021   Procedure: APPLICATION OF INTRAOPERATIVE CT SCAN;  Surgeon: Venetia Night, MD;  Location: [facility];  Service: Neurosurgery;  Laterality: N/A;   COLONOSCOPY     CYST EXCISION     ELBOW ARTHROSCOPY Left 10/10/2020   Procedure: LEFT ELBOW ARTHROSCOPY WITH DEBRIDEMENT AND EXCISION OF LOOSE BODIES;  Surgeon: Christena Flake, MD;  Location: [facility];  Service: Orthopedics;  Laterality: Left;   MYRINGOTOMY WITH TUBE PLACEMENT     ORIF ELBOW FRACTURE Left 08/28/2021   Procedure: OPEN REDUCTION INTERNAL  FIXATION (ORIF) ELBOW/OLECRANON FRACTURE;  Surgeon: Christena Flake, MD;  Location: [facility];  Service: Orthopedics;  Laterality: Left;   SHOULDER ARTHROSCOPY Left 03/18/2017   Procedure: ARTHROSCOPY SHOULDER with debridement decompression and distal clavicle excision;  Surgeon: Christena Flake, MD;  Location: [facility];  Service: Orthopedics;  Laterality: Left;   SHOULDER ARTHROSCOPY WITH OPEN ROTATOR CUFF REPAIR Right 09/23/2016   Procedure: SHOULDER ARTHROSCOPY WITH OPEN ROTATOR CUFF REPAIR;  Surgeon: Christena Flake, MD;  Location: [facility];  Service: Orthopedics;  Laterality: Right;   SHOULDER ARTHROSCOPY WITH SUBACROMIAL DECOMPRESSION Right 09/23/2016   Procedure: SHOULDER ARTHROSCOPY WITH SUBACROMIAL DECOMPRESSION AND DEBRIDEMENT;  Surgeon: Christena Flake, MD;  Location: [facility];  Service: Orthopedics;  Laterality: Right;       Home Medications    Prior to Admission medications   Medication Sig Start Date End Date Taking? Authorizing Provider  lurasidone (LATUDA) 40 MG TABS tablet Take 40 mg by mouth daily after supper.   Yes [provider]  ondansetron (ZOFRAN-ODT) 8 MG disintegrating tablet Take 1 tablet (8 mg total) by mouth every 8 (eight) hours as needed for nausea or vomiting. 05/19/22  Yes Becky Augusta, NP  QUEtiapine (SEROQUEL) 100 MG tablet Take 50-100 mg by mouth at bedtime. 08/08/21  Yes [provider]    Family History History reviewed. No pertinent family history.  Social History Social History  Tobacco Use   Smoking status: Every Day    Packs/day: 0.50    Years: 22.00    Total pack years: 11.00    Types: Cigarettes   Smokeless tobacco: Never  Vaping Use   Vaping Use: Never used  Substance Use Topics   Alcohol use: Yes    Comment: OCC   Drug use: No     Allergies   Penicillins   Review of Systems Review of Systems  Constitutional:  Negative for fever.  Gastrointestinal:  Positive for abdominal distention, abdominal pain,  constipation, nausea and vomiting. Negative for diarrhea.  Hematological: Negative.   Psychiatric/Behavioral: Negative.       Physical Exam Triage Vital Signs ED Triage Vitals  Enc Vitals Group     BP 05/19/22 1336 (S) (!) 166/114     Pulse Rate 05/19/22 1336 96     Resp --      Temp 05/19/22 1336 98.6 F (37 C)     Temp src --      SpO2 05/19/22 1336 99 %     Weight 05/19/22 1334 148 lb (67.1 kg)     Height 05/19/22 1334 6' (1.829 m)     Head Circumference --      Peak Flow --      Pain Score 05/19/22 1334 7     Pain Loc --      Pain Edu? --      Excl. in GC? --    No data found.  Updated Vital Signs BP (S) (!) 166/114 (BP Location: Left Arm)   Pulse 96   Temp 98.6 F (37 C)   Ht 6' (1.829 m)   Wt 148 lb (67.1 kg)   SpO2 99%   BMI 20.07 kg/m   Visual Acuity Right Eye Distance:   Left Eye Distance:   Bilateral Distance:    Right Eye Near:   Left Eye Near:    Bilateral Near:     Physical Exam Vitals and nursing note reviewed.  Constitutional:      Appearance: Normal appearance. He is not ill-appearing.  HENT:     Head: Normocephalic and atraumatic.  Cardiovascular:     Rate and Rhythm: Normal rate.     Pulses: Normal pulses.     Heart sounds: Normal heart sounds. No murmur heard.    No friction rub. No gallop.  Pulmonary:     Effort: Pulmonary effort is normal.     Breath sounds: Normal breath sounds. No wheezing, rhonchi or rales.  Abdominal:     General: Abdomen is flat. There is distension.     Palpations: Abdomen is soft.     Tenderness: There is abdominal tenderness. There is no guarding or rebound.  Skin:    General: Skin is warm and dry.     Capillary Refill: Capillary refill takes less than 2 seconds.     Findings: No erythema or rash.  Neurological:     General: No focal deficit present.     Mental Status: He is alert and oriented to person, place, and time.  Psychiatric:        Mood and Affect: Mood normal.        Behavior: Behavior  normal.        Thought Content: Thought content normal.        Judgment: Judgment normal.      UC Treatments / Results  Labs (all labs ordered are listed, but only abnormal results are displayed) Labs Reviewed -  No data to display  EKG   Radiology DG Abdomen 1 View  Result Date: 05/19/2022 CLINICAL DATA:  No BM x 4 days, abdominal distension. EXAM: ABDOMEN - 1 VIEW COMPARISON:  None Available. FINDINGS: No evidence of bowel obstruction. Moderate stool burden most prominent in the rectum. No acute osseous abnormality. Lumbar spine fusion hardware noted. IMPRESSION: No evidence of bowel obstruction. Moderate stool burden most prominent in the colon. Electronically Signed   By: Maurine Simmering M.D.   On: 05/19/2022 14:25    Procedures Procedures (including critical care time)  Medications Ordered in UC Medications - No data to display  Initial Impression / Assessment and Plan / UC Course  I have reviewed the triage vital signs and the nursing notes.  Pertinent labs & imaging results that were available during my care of the patient were reviewed by me and considered in my medical decision making (see chart for details).   Patient is a very pleasant, nontoxic-appearing 52 old male here for evaluation of 4 days worth of constipation with some associate abdominal distention left lower quadrant abdominal pain.  Patient is 5 months status post L4-5 lumbar fusion that was conducted via an anterior approach I was at physical therapy this morning when he developed some bilious emesis.  He has not had any fever and denies any emesis to this point.  He does not have a history of constipation though he states it did take 3 days after his surgery for his bowels to wake up and for him to be able to have a bowel movement.  There is no significant distention on exam.  I will obtain a KUB to look for the presence of constipation.  KUB independently reviewed and evaluated by me.  Impression: There is stool  throughout the colon with a large ball of stool in the rectum.  Radiology overread is pending. Radiology impression states no evidence of bowel obstruction but moderate stool burden most prominent in the colon.  I will discharge patient home with a diagnosis of constipation and have him use a double dose of MiraLAX along with a fleets mineral oil enema to help resolve the constipation.  If his constipation does not resolve, or he develops worsening abdominal pain, he needs to go to the ER for evaluation and possible soap suds enema.  I will also discharge patient home with some Zofran to help with nausea.  Final Clinical Impressions(s) / UC Diagnoses   Final diagnoses:  Constipation, unspecified constipation type     Discharge Instructions      Your x-ray today showed that you have a moderate amount of stool throughout your colon including a large ball of stool in your rectal vault.  Please use over-the-counter MiraLAX and take a double dose (2 capfuls) and 16 ounces of a beverage of your choice.  I also want you to pick up an over-the-counter fleets mineral oil enema and administer to yourself.  This will help lubricate the stool in your rectum and help you to pass it.  If you do not have a resolution of your constipation, have increased abdominal distention, develop abdominal pain, or have increased nausea and vomiting despite the Zofran you need to go to the ER for evaluation.     ED Prescriptions     Medication Sig Dispense Auth. Provider   ondansetron (ZOFRAN-ODT) 8 MG disintegrating tablet Take 1 tablet (8 mg total) by mouth every 8 (eight) hours as needed for nausea or vomiting. 20 tablet Margarette Canada,  NP      PDMP not reviewed this encounter.   Becky Augusta, NP 05/19/22 1507

## 2022-05-21 ENCOUNTER — Encounter: Payer: Self-pay | Admitting: Physical Therapy

## 2022-05-21 ENCOUNTER — Ambulatory Visit: Payer: Medicare Other | Admitting: Physical Therapy

## 2022-05-21 VITALS — BP 134/91 | HR 95

## 2022-05-21 DIAGNOSIS — M6281 Muscle weakness (generalized): Secondary | ICD-10-CM | POA: Diagnosis not present

## 2022-05-21 DIAGNOSIS — R2681 Unsteadiness on feet: Secondary | ICD-10-CM

## 2022-05-21 DIAGNOSIS — R269 Unspecified abnormalities of gait and mobility: Secondary | ICD-10-CM

## 2022-05-21 DIAGNOSIS — Z981 Arthrodesis status: Secondary | ICD-10-CM

## 2022-05-21 DIAGNOSIS — M545 Low back pain, unspecified: Secondary | ICD-10-CM

## 2022-05-21 NOTE — Therapy (Signed)
OUTPATIENT PHYSICAL THERAPY TREATMENT    Patient Name: Justin Nixon MRN: [id number] DOB:15-Jun-1976, 46 y.o., male Today's Date: 04/24/2022   END OF SESSION:   PT End of Session - 05/21/22 1158     Visit Number 39    Number of Visits 43    Date for PT Re-Evaluation 06/05/22    Progress Note Due on Visit 40    PT Start Time 1147    PT Stop Time 1230    PT Time Calculation (min) 43 min    Equipment Utilized During Treatment Gait belt    Activity Tolerance Patient tolerated treatment well    Behavior During Therapy WFL for tasks assessed/performed                     Past Medical History:  Diagnosis Date   Anxiety    Arthritis    GERD (gastroesophageal reflux disease)    H/O   Hyperlipidemia    Schizophrenia (HCC)    Past Surgical History:  Procedure Laterality Date   ANTERIOR LATERAL LUMBAR FUSION WITH PERCUTANEOUS SCREW 1 LEVEL N/A 11/18/2021   Procedure: L4-5 LATERAL LUMBAR INTERBODY FUSION WITH POSTERIOR SPINAL FUSION;  Surgeon: Venetia Night, MD;  Location: [facility];  Service: Neurosurgery;  Laterality: N/A;   APPLICATION OF INTRAOPERATIVE CT SCAN N/A 11/18/2021   Procedure: APPLICATION OF INTRAOPERATIVE CT SCAN;  Surgeon: Venetia Night, MD;  Location: [facility];  Service: Neurosurgery;  Laterality: N/A;   COLONOSCOPY     CYST EXCISION     ELBOW ARTHROSCOPY Left 10/10/2020   Procedure: LEFT ELBOW ARTHROSCOPY WITH DEBRIDEMENT AND EXCISION OF LOOSE BODIES;  Surgeon: Christena Flake, MD;  Location: [facility];  Service: Orthopedics;  Laterality: Left;   MYRINGOTOMY WITH TUBE PLACEMENT     ORIF ELBOW FRACTURE Left 08/28/2021   Procedure: OPEN REDUCTION INTERNAL FIXATION (ORIF) ELBOW/OLECRANON FRACTURE;  Surgeon: Christena Flake, MD;  Location: [facility];  Service: Orthopedics;  Laterality: Left;   SHOULDER ARTHROSCOPY Left 03/18/2017   Procedure: ARTHROSCOPY SHOULDER with debridement decompression and distal clavicle excision;  Surgeon: Christena Flake, MD;   Location: [facility];  Service: Orthopedics;  Laterality: Left;   SHOULDER ARTHROSCOPY WITH OPEN ROTATOR CUFF REPAIR Right 09/23/2016   Procedure: SHOULDER ARTHROSCOPY WITH OPEN ROTATOR CUFF REPAIR;  Surgeon: Christena Flake, MD;  Location: [facility];  Service: Orthopedics;  Laterality: Right;   SHOULDER ARTHROSCOPY WITH SUBACROMIAL DECOMPRESSION Right 09/23/2016   Procedure: SHOULDER ARTHROSCOPY WITH SUBACROMIAL DECOMPRESSION AND DEBRIDEMENT;  Surgeon: Christena Flake, MD;  Location: [facility];  Service: Orthopedics;  Laterality: Right;   Patient Active Problem List   Diagnosis Date Noted   S/P spinal fusion 11/18/2021   Olecranon fracture, left, open type I or II, initial encounter 08/28/2021      PCP: Gracelyn Nurse MD REFERRING PROVIDER: Venetia Night, MD     REFERRING DIAG: Z98.1 (ICD-10-CM) - History of lumbar fusion   THERAPY DIAG:  Muscle weakness (generalized)   S/P lumbar fusion   Abnormality of gait and mobility   Unsteadiness on feet   Rationale for Evaluation and Treatment Rehabilitation   PERTINENT HISTORY: Pt presents to physical therapy for evaluation of low back and LE pain and weakness L>R, following L4-L5 XLIF/PSF on 11/18/21. Pt states he was using a cane before surgery due to onset of LLE weakness. States he has now been using a RW since surgery. Pain ranges from a 5/10 to a 10/10. When aggravated pain is described  as sharp and shooting into LLE; at baseline, pain is achy. Wearing clothes such as jeans is painful due to sensitivity of skin; at home wears pajamas. Pt has not returned to driving; he is limited to household distance ambulation or maximum into a doctors office. He feels as though his LLE is going to "give out" when he is walking or weightbearing. 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 Reports frequent falling since surgery due to LLE  giving out; no injuries associated with falls.    PRECAUTIONS: None at this time following 03/13/22 visit with Dr. Myer Haff     OBJECTIVE: (objective measures completed at initial evaluation unless otherwise dated)   DIAGNOSTIC FINDINGS:  "Intraoperative images during L4-L5 anterior and posterior fusion. No evidence of immediate complication"   PATIENT SURVEYS:  Modified Oswestry (updated 01/09/22 due to incomplete form): 62%.   02/11/22: 48%.  03/04/22: 28% FOTO: eval 23/41,   02/11/22: 44/41   SCREENING FOR RED FLAGS: Bowel or bladder incontinence: No Spinal tumors: No Cauda equina syndrome: No Compression fracture: No Abdominal aneurysm: No   COGNITION:           Overall cognitive status: Within functional limits for tasks assessed                          SENSATION: Light touch: Impaired  - increased sensitivity in left L2 and L3 dermatomes    MUSCLE LENGTH: Hamstrings: unable to test due to increase in sharp/shooting pain in hip flexors prior to HS stretch being felt.  (01/28/22): R lacking 30 deg, L lacking 40 deg.   Thomas test: NT   POSTURE: decreased lumbar lordosis and posterior pelvic tilt   PALPATION: Right: hip flexors and lateral hip, glute med, piriformis, deep external rotators, QL Left: hip flexors, lateral hip and adductors (from proximal hip to mid-thigh), proximal hamstrings, L4-distal paraspinals, QL, all gluteals   OBSERVATION 01/21/22: No bruising, ecchymosis, increased tissue temperature, or erythema observed along L flank, L gluteal region, or adjacent to surgical incisions. No sign of infection or delayed healing   LUMBAR ROM: not assessed due to back precautions    Active  A/PROM  eval  Flexion -  Extension -  Right lateral flexion -  Left lateral flexion -  Right rotation -  Left rotation -   (Blank rows = not tested)   LOWER EXTREMITY ROM:      Passive  Right eval Left eval  Hip flexion 100 90  Hip extension NT NT  Hip abduction 20 20   Hip adduction      Hip internal rotation      Hip external rotation      Knee flexion [facility]  Knee extension [facility]  Ankle dorsiflexion [facility]  Ankle plantarflexion      Ankle inversion      Ankle eversion       (Blank rows = not tested)   LOWER EXTREMITY MMT:     MMT Right eval Left eval Right 02/11/22 Left 02/11/22 Right 05/12/22 Left 05/12/22  Hip flexion 4-* 4-* 4+ 4* 5 4+  Hip extension            Hip abduction 4+* 4-* 4+ 4+    Hip adduction 4* 4* 4+ 4+ 5 4+  Hip internal rotation 5 3* 5 4* 4+* 4+  Hip external rotation 5 4*  5 4* 5 4+  Knee flexion 5 4* 5 4+ 5 5-  Knee extension 5 4+ 5 4 5  4+  Ankle dorsiflexion 4+ 3+ 5 4 5  4+  Ankle plantarflexion             (Blank rows = not tested)     FUNCTIONAL TESTS:  5 times sit to stand: 43.37 seconds using arms.    02/11/22: 36.1 seconds.  03/04/22: 27.4 seconds.  6 minute walk test (performed 01/14/22): 280 feet.     02/11/22: 600 feet.  03/04/22: 735 feet. 10 meter walk test: 24.3 seconds (comfortable) = 0.41 m/s using RW; terminated fast-pace for safety.   02/11/22: 0.64 m/s comfortable speed, 0.65 m/s fast speed.  03/04/22: 0.65 m/s fast speed   STS mechanics: no UE support, use of momentum with rapid hip hinge to initiate sit to stand. Intermittent LLE buckling and varus/valgus immediately following initiation of sit to stand             -03/04/22: After 2 attempts pt able to perform sit to stand with no UE support, upward velocity of transfers slows down halfway through transfer with intermittent LLE varus/valgus   GAIT: 03/04/22: Distance walked: 735 feet Assistive device utilized: [facility] Level of assistance: standby assist Comments: good heel to toe pattern, intermittent LLE buckling requiring pt holding wall and intermittently placing hands on his L thigh for support; no assist required from PT to prevent fall     Injury screen 04/08/22 Homan's sign: Negative. Ankle inversion and eversion stress:  Negative Dorsiflexion-eversion test: Negative  No significant color change, erythema, ecchymosis, venous distension, or girth asymmetry along calf between R and LLE. Pt has non-pitting edema along distal tibia medially       TODAY'S TREATMENT    SUBJECTIVE: Patient reports improvement in GI symptoms/constipation following enema. He reports notable back pain from being bent over vomiting the other day. He reports diastolic blood pressure at home > 100 mm Hg.   PAIN:  Are you having pain? No Pain location: -   Today's Vitals   05/21/22 1212  BP: (!) 134/91  Pulse: 95  SpO2: 100%   There is no height or weight on file to calculate BMI.   Moist Hot Pack (unbilled) utilized prior to manual therapy for analgesic effect and improved soft tissue extensibility along low back in prone x 5 minutes with 2 pillows under pelvis    Manual Therapy - for symptom modulation, soft tissue sensitivity and mobility, joint mobility, ROM   (Prone lying with 2 pillows under pelvis)   STM and IASTM with Hypervolt along bilateral L1-L5 erector spinae x 10 minutes    Therapeutic Exercise - for improved soft tissue flexibility and extensibility as needed for ROM, improved strength as needed to improve performance of CKC activities/functional movements and prevention of fall during episode of LOB, progressive standing/weightbearing activity to improve capacity for community-level mobility with decreasing reliance on AD/external support.  SciFit seated elliptical; Level 6.5; seat at 10, 5 minutes - subjective information gathered during this time    Lower trunk rotations in hookliyng; 1x10   Forward mini-lunge, adjacent to treadmill arm support for UE assist prn; 2x15  Forward step up to step tap on 2nd step; 2x10; staircase in center of gym. Performed with 8-lb Dbell bilat UE   STS with x1 airex pad under RLE to improve LLE strength: 2x10 with 8-lb medicine ball;  Close SBA overall but CGA on second  set with VC's for  anterior weight shift to assist in standing due to LE fatigue.   Total Gym; Level 22; L single-leg minisquat; 1x15, 1x8, 1x8 (completed "drop set" strategy for 3 sets due to L quad fatigability)    *not today* On blue agility ladder: high knee with opposite knee tap 2x D/B, gate opener 3x D/B Open-gate, on blue agility ladder; 3x D/B Heel raises: unilateral 1x15, 1x8 L LE, bilateral hand support on railing Unipedal stance on foam x multiple attempts Unipedal stance on floor 3x15 sec    *not today* Resisted walking forward and laterally in // bars. X2 Black resistance tubing. CGA, x5 laps each direction  Lateral L sided 6" step up: 1x12, 1x10 Lateral side steps alternating hurdle heights. 5# AW's. X6 laps forwards, x6 laps lateral. Intermittent UE support needed on // bar for support. SBA.  Standing marches with 4# ankle weights 2 x 1 minute, PT used hand as target for L hip flexion; pt unilateral hand support on railing     HOME EXERCISE PROGRAM: Access Code [email or web address]       ASSESSMENT:   CLINICAL IMPRESSION: Patient had follow-up with urgent care next door and was given strategies to facilitate BM given ongoing constipation; pt reports improvement in this condition since then. He reports back pain resulting from bending over and vomiting the other day. He reports remaining pain affecting axial low back today/bilateral paraspinal region. Utilized manual therapy and moist heat today along with gentle lower trunk rotations prior to continuing with standing exercise. Pt is able to participate in active intervention well in spite of recent GI issues and flare-up of back pain. Pt still has notable functional limitations s/p lumbar fusion. Will continue strengthening and focusing on recovery of function as tolerated. Pt has remaining deficits in decreased LLE strength, intermittent gait instability with buckling of L knee, L flank and hip pain that is intermittent, L hip and  lateral thigh sensitivity. Pt will continue to benefit from skilled PT services to address his noted deficits and improve function.     OBJECTIVE IMPAIRMENTS Abnormal gait, decreased activity tolerance, decreased balance, decreased endurance, decreased mobility, difficulty walking, decreased ROM, decreased strength, decreased safety awareness, impaired flexibility, impaired sensation, postural dysfunction, and pain.    ACTIVITY LIMITATIONS carrying, lifting, bending, sitting, standing, squatting, stairs, and transfers   PARTICIPATION LIMITATIONS: cleaning, laundry, driving, shopping, community activity, occupation, and yard work   PERSONAL FACTORS Time since onset of injury/illness/exacerbation and 1-2 comorbidities: schizophrenia and anxiety  are also affecting patient's functional outcome.    REHAB POTENTIAL: Good   CLINICAL DECISION MAKING: Evolving/moderate complexity   EVALUATION COMPLEXITY: Moderate     GOALS: Goals reviewed with patient? Yes   SHORT TERM GOALS: Target date: 01/21/2022   Patient will be independent in home exercise program to improve strength/mobility for better functional independence with ADLs. Baseline:   02/11/22: Pt is compliant with his HEP  Goal status: ACHIEVED   2.  Pt will ambulate 200+ feet using RW without LOB or LLE buckling to demonstrate progression towards improved ambulatory endurance.  Baseline: 19ft .    02/11/22: Pt able to ambulate > 200 feet without LLE buckling Goal status: ACHIEVED   3.   Patient will deny any falls over past 4 weeks to demonstrate improved safety at home.  Baseline: 4 falls since surgery.   02/11/22: 2 falls in June.   03/04/22: No falls in previous month.  03/25/22: Pt last had fall in home 03/22/22.  04/15/22: One fall in home  about 3.5 weeks ago.  05/12/22: No falls since 03/22/22  Goal status: ACHIEVED     LONG TERM GOALS: Target date: 03/04/2022   Patient will increase FOTO score to equal to or greater than 41 to  demonstrate statistically significant improvement in mobility and quality of life.  Baseline: 01/07/22: 23/41.  7/11/2: 44/41.  04/15/22: 52/41 Goal status: ACHIEVED   2.   Patient (< 46 years old) will complete five times sit to stand test in < 10 seconds indicating an increased LE strength and improved balance. Baseline: 01/07/22: 43 seconds.   02/11/22: 36.1 sec.  03/04/22: 27.4 seconds.    03/18/22: 21.6 seconds.  04/15/22: 16 seconds.    05/12/22: 16.9 sec Goal status: IN PROGRESS   3.  Patient will increase 10 meter walk test to >1.8076m/s as to improve gait speed for better community ambulation and to reduce fall risk. Baseline: 01/07/22: 0.41 m/s.   02/11/22: 0.65 m/s.    03/04/22: 0.65 m/s fast speed   03/18/22: 0.90 m/s fast speed.   04/15/22: 0.90 m/s 05/12/22: 0.96 m/s Goal status: IN PROGRESS   4.  Patient will increase six minute walk test distance to >1000 for progression to community ambulator and improve gait ability. Baseline: 01/14/22: 280 feet.  02/11/22: 600 feet.   03/04/22: 735 feet.   03/18/22: 840 feet.   04/15/22: 840 feet.  05/12/22: 875 feet  Goal status: IN PROGRESS   5.  Patient will increase LLE gross strength to 4/5 as to improve functional strength for independent gait, increased standing tolerance and increased ADL ability. Baseline: 01/07/22: ranging 3-4/5, please see objective.   02/11/22: 4 to 4+/5 MMTs, see above.  05/12/22: 4+ to 5/5 MMTs, see chart above Goal status: ACHIEVED         PLAN: PT FREQUENCY: 2x/week   PT DURATION: 4-5 weeks   PLANNED INTERVENTIONS: Therapeutic exercises, Therapeutic activity, Neuromuscular re-education, Balance training, Gait training, Patient/Family education, Joint mobilization, Stair training, DME instructions, Dry Needling, Cryotherapy, Moist heat, and Manual therapy.   PLAN FOR NEXT SESSION: LE strengthening, trunk stabilization, LE flexibility. Progress with increasing emphasis on standing/weightbearing activity and continued AD wean as able  [facility] to no AD. Modalities and manual therapy for pain modulation as needed. Continue PT with increasing exercise intensity as able with careful CGA/SBA to prevent fall.    Consuela MimesJeremy Shadana Pry, PT, DPT #[id number]  Gertie ExonJeremy T Calem Cocozza 12:15 PM 05/21/2022

## 2022-05-26 ENCOUNTER — Encounter: Payer: Medicare Other | Admitting: Physical Therapy

## 2022-05-28 ENCOUNTER — Encounter: Payer: Self-pay | Admitting: Physical Therapy

## 2022-05-28 ENCOUNTER — Ambulatory Visit: Payer: Medicare Other | Admitting: Physical Therapy

## 2022-05-28 DIAGNOSIS — M6281 Muscle weakness (generalized): Secondary | ICD-10-CM

## 2022-05-28 DIAGNOSIS — Z981 Arthrodesis status: Secondary | ICD-10-CM

## 2022-05-28 DIAGNOSIS — R269 Unspecified abnormalities of gait and mobility: Secondary | ICD-10-CM

## 2022-05-28 DIAGNOSIS — M545 Low back pain, unspecified: Secondary | ICD-10-CM

## 2022-05-28 DIAGNOSIS — R2681 Unsteadiness on feet: Secondary | ICD-10-CM

## 2022-05-28 NOTE — Therapy (Addendum)
OUTPATIENT PHYSICAL THERAPY TREATMENT AND PROGRESS NOTE/RE-CERTIFICATION   Dates of reporting period  04/22/22   to   05/28/22     Patient Name: Justin Nixon MRN: [id number] DOB:08-Jun-1976, 46 y.o., male Today's Date: 04/24/2022   END OF SESSION:   PT End of Session - 06/01/22 0532     Visit Number 40    Number of Visits 43    Date for PT Re-Evaluation 06/05/22    Progress Note Due on Visit 74    PT Start Time 1147    PT Stop Time 1230    PT Time Calculation (min) 43 min    Equipment Utilized During Treatment Gait belt    Activity Tolerance Patient tolerated treatment well    Behavior During Therapy WFL for tasks assessed/performed                      Past Medical History:  Diagnosis Date   Anxiety    Arthritis    GERD (gastroesophageal reflux disease)    H/O   Hyperlipidemia    Schizophrenia ([place])    Past Surgical History:  Procedure Laterality Date   ANTERIOR LATERAL LUMBAR FUSION WITH PERCUTANEOUS SCREW 1 LEVEL N/A 11/18/2021   Procedure: L4-5 LATERAL LUMBAR INTERBODY FUSION WITH POSTERIOR SPINAL FUSION;  Surgeon: Meade Maw, MD;  Location: [facility];  Service: Neurosurgery;  Laterality: N/A;   APPLICATION OF INTRAOPERATIVE CT SCAN N/A 11/18/2021   Procedure: APPLICATION OF INTRAOPERATIVE CT SCAN;  Surgeon: Meade Maw, MD;  Location: [facility];  Service: Neurosurgery;  Laterality: N/A;   COLONOSCOPY     CYST EXCISION     ELBOW ARTHROSCOPY Left 10/10/2020   Procedure: LEFT ELBOW ARTHROSCOPY WITH DEBRIDEMENT AND EXCISION OF LOOSE BODIES;  Surgeon: Corky Mull, MD;  Location: [facility];  Service: Orthopedics;  Laterality: Left;   MYRINGOTOMY WITH TUBE PLACEMENT     ORIF ELBOW FRACTURE Left 08/28/2021   Procedure: OPEN REDUCTION INTERNAL FIXATION (ORIF) ELBOW/OLECRANON FRACTURE;  Surgeon: Corky Mull, MD;  Location: [facility];  Service: Orthopedics;  Laterality: Left;   SHOULDER ARTHROSCOPY Left 03/18/2017   Procedure: ARTHROSCOPY SHOULDER  with debridement decompression and distal clavicle excision;  Surgeon: Corky Mull, MD;  Location: [place];  Service: Orthopedics;  Laterality: Left;   SHOULDER ARTHROSCOPY WITH OPEN ROTATOR CUFF REPAIR Right 09/23/2016   Procedure: SHOULDER ARTHROSCOPY WITH OPEN ROTATOR CUFF REPAIR;  Surgeon: Corky Mull, MD;  Location: [facility];  Service: Orthopedics;  Laterality: Right;   SHOULDER ARTHROSCOPY WITH SUBACROMIAL DECOMPRESSION Right 09/23/2016   Procedure: SHOULDER ARTHROSCOPY WITH SUBACROMIAL DECOMPRESSION AND DEBRIDEMENT;  Surgeon: Corky Mull, MD;  Location: [facility];  Service: Orthopedics;  Laterality: Right;   Patient Active Problem List   Diagnosis Date Noted   S/P spinal fusion 11/18/2021   Olecranon fracture, left, open type I or II, initial encounter 08/28/2021      PCP: Baxter Hire MD REFERRING PROVIDER: Meade Maw, MD     REFERRING DIAG: Z98.1 (ICD-10-CM) - History of lumbar fusion   THERAPY DIAG:  Muscle weakness (generalized)   S/P lumbar fusion   Abnormality of gait and mobility   Unsteadiness on feet   Rationale for Evaluation and Treatment Rehabilitation   PERTINENT HISTORY: Pt presents to physical therapy for evaluation of low back and LE pain and weakness L>R, following L4-L5 XLIF/PSF on 11/18/21. Pt states he was using a cane before surgery due to onset of LLE weakness. States he has now  been using a RW since surgery. Pain ranges from a 5/10 to a 10/10. When aggravated pain is described as sharp and shooting into LLE; at baseline, pain is achy. Wearing clothes such as jeans is painful due to sensitivity of skin; at home wears pajamas. Pt has not returned to driving; he is limited to household distance ambulation or maximum into a doctors office. He feels as though his LLE is going to "give out" when he is walking or weightbearing. 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 Reports frequent falling since surgery due to LLE giving out; no injuries associated with falls.    PRECAUTIONS: None at this time following 03/13/22 visit with Dr. Myer Haff     OBJECTIVE: (objective measures completed at initial evaluation unless otherwise dated)   DIAGNOSTIC FINDINGS:  "Intraoperative images during L4-L5 anterior and posterior fusion. No evidence of immediate complication"   PATIENT SURVEYS:  Modified Oswestry (updated 01/09/22 due to incomplete form): 62%.   02/11/22: 48%.  03/04/22: 28% FOTO: eval 23/41,   02/11/22: 44/41   SCREENING FOR RED FLAGS: Bowel or bladder incontinence: No Spinal tumors: No Cauda equina syndrome: No Compression fracture: No Abdominal aneurysm: No   COGNITION:           Overall cognitive status: Within functional limits for tasks assessed                          SENSATION: Light touch: Impaired  - increased sensitivity in left L2 and L3 dermatomes    MUSCLE LENGTH: Hamstrings: unable to test due to increase in sharp/shooting pain in hip flexors prior to HS stretch being felt.  (01/28/22): R lacking 30 deg, L lacking 40 deg.   Thomas test: NT   POSTURE: decreased lumbar lordosis and posterior pelvic tilt   PALPATION: Right: hip flexors and lateral hip, glute med, piriformis, deep external rotators, QL Left: hip flexors, lateral hip and adductors (from proximal hip to mid-thigh), proximal hamstrings, L4-distal paraspinals, QL, all gluteals   OBSERVATION 01/21/22: No bruising, ecchymosis, increased tissue temperature, or erythema observed along L flank, L gluteal region, or adjacent to surgical incisions. No sign of infection or delayed healing   LUMBAR ROM: not assessed due to back precautions    Active  A/PROM  eval  Flexion -  Extension -  Right lateral flexion -  Left lateral flexion -  Right rotation -  Left rotation -   (Blank rows = not tested)   LOWER EXTREMITY ROM:      Passive   Right eval Left eval  Hip flexion 100 90  Hip extension NT NT  Hip abduction 20 20  Hip adduction      Hip internal rotation      Hip external rotation      Knee flexion [facility]  Knee extension [facility]  Ankle dorsiflexion [facility]  Ankle plantarflexion      Ankle inversion      Ankle eversion       (Blank rows = not tested)   LOWER EXTREMITY MMT:     MMT Right eval Left eval Right 02/11/22 Left 02/11/22 Right 05/12/22 Left 05/12/22  Hip flexion 4-* 4-* 4+ 4* 5 4+  Hip extension            Hip abduction 4+* 4-* 4+ 4+    Hip adduction 4* 4* 4+  4+ 5 4+  Hip internal rotation 5 3* 5 4* 4+* 4+  Hip external rotation 5 4* 5 4* 5 4+  Knee flexion 5 4* 5 4+ 5 5-  Knee extension 5 4+ 5 4 5  4+  Ankle dorsiflexion 4+ 3+ 5 4 5  4+  Ankle plantarflexion             (Blank rows = not tested)     FUNCTIONAL TESTS:  5 times sit to stand: 43.37 seconds using arms.    02/11/22: 36.1 seconds.  03/04/22: 27.4 seconds.  6 minute walk test (performed 01/14/22): 280 feet.     02/11/22: 600 feet.  03/04/22: 735 feet. 10 meter walk test: 24.3 seconds (comfortable) = 0.41 m/s using RW; terminated fast-pace for safety.   02/11/22: 0.64 m/s comfortable speed, 0.65 m/s fast speed.  03/04/22: 0.65 m/s fast speed   STS mechanics: no UE support, use of momentum with rapid hip hinge to initiate sit to stand. Intermittent LLE buckling and varus/valgus immediately following initiation of sit to stand             -03/04/22: After 2 attempts pt able to perform sit to stand with no UE support, upward velocity of transfers slows down halfway through transfer with intermittent LLE varus/valgus   GAIT: 03/04/22: Distance walked: 735 feet Assistive device utilized: [facility] Level of assistance: standby assist Comments: good heel to toe pattern, intermittent LLE buckling requiring pt holding wall and intermittently placing hands on his L thigh for support; no assist required from PT to prevent fall     Injury screen  04/08/22 Homan's sign: Negative. Ankle inversion and eversion stress: Negative Dorsiflexion-eversion test: Negative  No significant color change, erythema, ecchymosis, venous distension, or girth asymmetry along calf between R and LLE. Pt has non-pitting edema along distal tibia medially       TODAY'S TREATMENT    SUBJECTIVE: 85% SANE score per patient. Patient reports he needs to be able to walk better and more quickly. 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 He reports no significant issues at arrival and resolution of recent GI symptoms/constipation.   PAIN:  Are you having pain? No Pain location: -     Manual Therapy - for symptom modulation, soft tissue sensitivity and mobility, joint mobility, ROM    *deferred today - focused on active intervention* (Prone lying with 2 pillows under pelvis)   STM and IASTM with Hypervolt along bilateral L1-L5 erector spinae x 10 minutes     Therapeutic Exercise - for improved soft tissue flexibility and extensibility as needed for ROM, improved strength as needed to improve performance of CKC activities/functional movements and prevention of fall during episode of LOB, progressive standing/weightbearing activity to improve capacity for community-level mobility with decreasing reliance on AD/external support.  SciFit seated elliptical; Level 6.5; seat at 10, 5 minutes - subjective information gathered during this time  On blue agility ladder: high knee with opposite knee tap 4x D/B,   Forward mini-lunge, adjacent to treadmill arm support for UE assist prn; 2x15  Lateral mini-lunge; 2x8, careful guarding on left side with PT  Forward step up to step tap on 2nd step; 2x10; staircase in center of gym. Performed with 8-lb Dbell bilat UE   TRX single leg  squat to chair;  chair + Airex pad; 2x8  Unipedal stance on floor 3x20 sec   Seated physioball Tband row with 5-sec isometric; 2x10, Blue Tband; cueing for upright posture  Seated physioball march, alternating arms and legs; 2x10, alternating   *not today* Moist Hot Pack (unbilled) utilized prior to manual therapy for analgesic effect and improved soft tissue extensibility along low back in prone x 5 minutes with 2 pillows under pelvis STS with x1 airex pad under RLE to improve LLE strength: 2x10 with 8-lb medicine ball;  Close SBA overall but CGA on second set with VC's for anterior weight shift to assist in standing due to LE fatigue.  Total Gym; Level 22; L single-leg minisquat; 1x15, 1x8, 1x8 (completed "drop set" strategy for 3 sets due to L quad fatigability)  Open-gate, on blue agility ladder; 3x D/B Heel raises: unilateral 1x15, 1x8 L LE, bilateral hand support on railing Unipedal stance on foam x multiple attempts Resisted walking forward and laterally in // bars. X2 Black resistance tubing. CGA, x5 laps each direction  Lateral L sided 6" step up: 1x12, 1x10 Lateral side steps alternating hurdle heights. 5# AW's. X6 laps forwards, x6 laps lateral. Intermittent UE support needed on // bar for support. SBA.  Standing marches with 4# ankle weights 2 x 1 minute, PT used hand as target for L hip flexion; pt unilateral hand support on railing     HOME EXERCISE PROGRAM: Access Code [email or web address]       ASSESSMENT:   CLINICAL IMPRESSION: Data from recent goal update was pulled forward for today's progress note. Patient's progressed has slowed over the previous month with ongoing intermittent episodes of decreased LLE stability during standing. Pt does present with normal gait pattern and good gait cadence, speed, and step length. He is able to readily perform transferring and obstacle/step negotiation, but he has intermittent buckling of LLE following moderate to high volume of weightbearing  activity. Pt is 6 months s/p fusion and resumption of normal activity is expected at this interval. Will continue with emphasis on advanced strengthening and recovery of function. Pt has remaining deficits in decreased LLE strength, motor fatigability of LLE antigravity musculature, intermittent gait instability with buckling of L knee, intermittent L flank and hip pain, L hip and lateral thigh sensitivity. Pt will continue to benefit from skilled PT services to address his noted deficits and improve function.     OBJECTIVE IMPAIRMENTS Abnormal gait, decreased activity tolerance, decreased balance, decreased endurance, decreased mobility, difficulty walking, decreased ROM, decreased strength, decreased safety awareness, impaired flexibility, impaired sensation, postural dysfunction, and pain.    ACTIVITY LIMITATIONS carrying, lifting, bending, sitting, standing, squatting, stairs, and transfers   PARTICIPATION LIMITATIONS: cleaning, laundry, driving, shopping, community activity, occupation, and yard work   PERSONAL FACTORS Time since onset of injury/illness/exacerbation and 1-2 comorbidities: schizophrenia and anxiety  are also affecting patient's functional outcome.    REHAB POTENTIAL: Good   CLINICAL DECISION MAKING: Evolving/moderate complexity   EVALUATION COMPLEXITY: Moderate     GOALS: Goals reviewed with patient? Yes   SHORT TERM GOALS: Target date: 01/21/2022   Patient will be independent in home exercise program to improve strength/mobility for better functional independence with ADLs. Baseline:   02/11/22: Pt is compliant with his HEP  Goal status: ACHIEVED   2.  Pt will ambulate 200+ feet using RW without LOB or LLE buckling to demonstrate progression towards improved ambulatory endurance.  Baseline: 126ft .    02/11/22: Pt able to ambulate > 200 feet without LLE  buckling Goal status: ACHIEVED   3.   Patient will deny any falls over past 4 weeks to demonstrate improved safety  at home.  Baseline: 4 falls since surgery.   02/11/22: 2 falls in June.   03/04/22: No falls in previous month.  03/25/22: Pt last had fall in home 03/22/22.  04/15/22: One fall in home about 3.5 weeks ago.  05/12/22: No falls since 03/22/22  Goal status: ACHIEVED     LONG TERM GOALS: Target date: 03/04/2022   Patient will increase FOTO score to equal to or greater than 41 to demonstrate statistically significant improvement in mobility and quality of life.  Baseline: 01/07/22: 23/41.  7/11/2: 44/41.  04/15/22: 52/41 Goal status: ACHIEVED   2.   Patient (< 52 years old) will complete five times sit to stand test in < 10 seconds indicating an increased LE strength and improved balance. Baseline: 01/07/22: 43 seconds.   02/11/22: 36.1 sec.  03/04/22: 27.4 seconds.    03/18/22: 21.6 seconds.  04/15/22: 16 seconds.    05/12/22: 16.9 sec Goal status: IN PROGRESS   3.  Patient will increase 10 meter walk test to >1.7m/s as to improve gait speed for better community ambulation and to reduce fall risk. Baseline: 01/07/22: 0.41 m/s.   02/11/22: 0.65 m/s.    03/04/22: 0.65 m/s fast speed   03/18/22: 0.90 m/s fast speed.   04/15/22: 0.90 m/s 05/12/22: 0.96 m/s Goal status: IN PROGRESS   4.  Patient will increase six minute walk test distance to >1000 for progression to community ambulator and improve gait ability. Baseline: 01/14/22: 280 feet.  02/11/22: 600 feet.   03/04/22: 735 feet.   03/18/22: 840 feet.   04/15/22: 840 feet.  05/12/22: 875 feet  Goal status: IN PROGRESS   5.  Patient will increase LLE gross strength to 4/5 as to improve functional strength for independent gait, increased standing tolerance and increased ADL ability. Baseline: 01/07/22: ranging 3-4/5, please see objective.   02/11/22: 4 to 4+/5 MMTs, see above.  05/12/22: 4+ to 5/5 MMTs, see chart above Goal status: ACHIEVED         PLAN: PT FREQUENCY: 2x/week   PT DURATION: 4-5 weeks   PLANNED INTERVENTIONS: Therapeutic exercises, Therapeutic activity,  Neuromuscular re-education, Balance training, Gait training, Patient/Family education, Joint mobilization, Stair training, DME instructions, Dry Needling, Cryotherapy, Moist heat, and Manual therapy.   PLAN FOR NEXT SESSION: LE strengthening, trunk stabilization, LE flexibility. Progress with increasing emphasis on standing/weightbearing activity and continued AD wean as able from [facility] to no AD. Modalities and manual therapy for pain modulation as needed. Continue PT with increasing exercise intensity as able with careful CGA/SBA to prevent fall.  Recommend continued PT 2x/week for 3-4 weeks   Addendum for re-certification only* Valentina Gu, PT, DPT #[id number]  Eilleen Kempf 5:32 AM 06/01/2022

## 2022-06-01 NOTE — Addendum Note (Signed)
Addended by: Valentina Gu T on: 06/01/2022 05:44 AM   Modules accepted: Orders

## 2022-06-02 ENCOUNTER — Ambulatory Visit: Payer: Medicare Other | Admitting: Physical Therapy

## 2022-06-04 ENCOUNTER — Ambulatory Visit: Payer: Medicare Other | Attending: Neurosurgery | Admitting: Physical Therapy

## 2022-06-04 DIAGNOSIS — M6281 Muscle weakness (generalized): Secondary | ICD-10-CM | POA: Insufficient documentation

## 2022-06-04 DIAGNOSIS — M545 Low back pain, unspecified: Secondary | ICD-10-CM | POA: Diagnosis present

## 2022-06-04 DIAGNOSIS — R269 Unspecified abnormalities of gait and mobility: Secondary | ICD-10-CM | POA: Diagnosis present

## 2022-06-04 DIAGNOSIS — R2681 Unsteadiness on feet: Secondary | ICD-10-CM | POA: Diagnosis present

## 2022-06-04 DIAGNOSIS — Z981 Arthrodesis status: Secondary | ICD-10-CM | POA: Diagnosis present

## 2022-06-04 NOTE — Therapy (Signed)
OUTPATIENT PHYSICAL THERAPY TREATMENT   Patient Name: Justin Nixon MRN: [id number] DOB:1975/09/25, 46 y.o., male Today's Date: 04/24/2022   END OF SESSION:   PT End of Session - 06/04/22 1159     Visit Number 41    Number of Visits 43    Date for PT Re-Evaluation 06/05/22    Progress Note Due on Visit 35    PT Start Time 1151    PT Stop Time 1231    PT Time Calculation (min) 40 min    Equipment Utilized During Treatment Gait belt    Activity Tolerance Patient tolerated treatment well    Behavior During Therapy WFL for tasks assessed/performed                 Past Medical History:  Diagnosis Date   Anxiety    Arthritis    GERD (gastroesophageal reflux disease)    H/O   Hyperlipidemia    Schizophrenia ([place])    Past Surgical History:  Procedure Laterality Date   ANTERIOR LATERAL LUMBAR FUSION WITH PERCUTANEOUS SCREW 1 LEVEL N/A 11/18/2021   Procedure: L4-5 LATERAL LUMBAR INTERBODY FUSION WITH POSTERIOR SPINAL FUSION;  Surgeon: Meade Maw, MD;  Location: [facility];  Service: Neurosurgery;  Laterality: N/A;   APPLICATION OF INTRAOPERATIVE CT SCAN N/A 11/18/2021   Procedure: APPLICATION OF INTRAOPERATIVE CT SCAN;  Surgeon: Meade Maw, MD;  Location: [facility];  Service: Neurosurgery;  Laterality: N/A;   COLONOSCOPY     CYST EXCISION     ELBOW ARTHROSCOPY Left 10/10/2020   Procedure: LEFT ELBOW ARTHROSCOPY WITH DEBRIDEMENT AND EXCISION OF LOOSE BODIES;  Surgeon: Corky Mull, MD;  Location: [facility];  Service: Orthopedics;  Laterality: Left;   MYRINGOTOMY WITH TUBE PLACEMENT     ORIF ELBOW FRACTURE Left 08/28/2021   Procedure: OPEN REDUCTION INTERNAL FIXATION (ORIF) ELBOW/OLECRANON FRACTURE;  Surgeon: Corky Mull, MD;  Location: [facility];  Service: Orthopedics;  Laterality: Left;   SHOULDER ARTHROSCOPY Left 03/18/2017   Procedure: ARTHROSCOPY SHOULDER with debridement decompression and distal clavicle excision;  Surgeon: Corky Mull, MD;  Location: [facility];  Service: Orthopedics;  Laterality: Left;   SHOULDER ARTHROSCOPY WITH OPEN ROTATOR CUFF REPAIR Right 09/23/2016   Procedure: SHOULDER ARTHROSCOPY WITH OPEN ROTATOR CUFF REPAIR;  Surgeon: Corky Mull, MD;  Location: [facility];  Service: Orthopedics;  Laterality: Right;   SHOULDER ARTHROSCOPY WITH SUBACROMIAL DECOMPRESSION Right 09/23/2016   Procedure: SHOULDER ARTHROSCOPY WITH SUBACROMIAL DECOMPRESSION AND DEBRIDEMENT;  Surgeon: Corky Mull, MD;  Location: [facility];  Service: Orthopedics;  Laterality: Right;   Patient Active Problem List   Diagnosis Date Noted   S/P spinal fusion 11/18/2021   Olecranon fracture, left, open type I or II, initial encounter 08/28/2021      PCP: Baxter Hire MD REFERRING PROVIDER: Meade Maw, MD     REFERRING DIAG: Z98.1 (ICD-10-CM) - History of lumbar fusion   THERAPY DIAG:  Muscle weakness (generalized)   S/P lumbar fusion   Abnormality of gait and mobility   Unsteadiness on feet   Rationale for Evaluation and Treatment Rehabilitation   PERTINENT HISTORY: Pt presents to physical therapy for evaluation of low back and LE pain and weakness L>R, following L4-L5 XLIF/PSF on 11/18/21. Pt states he was using a cane before surgery due to onset of LLE weakness. States he has now been using a RW since surgery. Pain ranges from a 5/10 to a 10/10. When aggravated pain is described as sharp and shooting into  LLE; at baseline, pain is achy. Wearing clothes such as jeans is painful due to sensitivity of skin; at home wears pajamas. Pt has not returned to driving; he is limited to household distance ambulation or maximum into a doctors office. He feels as though his LLE is going to "give out" when he is walking or weightbearing. 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 Reports frequent falling since surgery due to LLE giving out; no  injuries associated with falls.    PRECAUTIONS: None at this time following 03/13/22 visit with Dr. Myer HaffYarbrough     OBJECTIVE: (objective measures completed at initial evaluation unless otherwise dated)   DIAGNOSTIC FINDINGS:  "Intraoperative images during L4-L5 anterior and posterior fusion. No evidence of immediate complication"   PATIENT SURVEYS:  Modified Oswestry (updated 01/09/22 due to incomplete form): 62%.   02/11/22: 48%.  03/04/22: 28% FOTO: eval 23/41,   02/11/22: 44/41   SCREENING FOR RED FLAGS: Bowel or bladder incontinence: No Spinal tumors: No Cauda equina syndrome: No Compression fracture: No Abdominal aneurysm: No   COGNITION:           Overall cognitive status: Within functional limits for tasks assessed                          SENSATION: Light touch: Impaired  - increased sensitivity in left L2 and L3 dermatomes    MUSCLE LENGTH: Hamstrings: unable to test due to increase in sharp/shooting pain in hip flexors prior to HS stretch being felt.  (01/28/22): R lacking 30 deg, L lacking 40 deg.   Thomas test: NT   POSTURE: decreased lumbar lordosis and posterior pelvic tilt   PALPATION: Right: hip flexors and lateral hip, glute med, piriformis, deep external rotators, QL Left: hip flexors, lateral hip and adductors (from proximal hip to mid-thigh), proximal hamstrings, L4-distal paraspinals, QL, all gluteals   OBSERVATION 01/21/22: No bruising, ecchymosis, increased tissue temperature, or erythema observed along L flank, L gluteal region, or adjacent to surgical incisions. No sign of infection or delayed healing   LUMBAR ROM: not assessed due to back precautions    Active  A/PROM  eval  Flexion -  Extension -  Right lateral flexion -  Left lateral flexion -  Right rotation -  Left rotation -   (Blank rows = not tested)   LOWER EXTREMITY ROM:      Passive  Right eval Left eval  Hip flexion 100 90  Hip extension NT NT  Hip abduction 20 20  Hip adduction       Hip internal rotation      Hip external rotation      Knee flexion [facility]  Knee extension [facility]  Ankle dorsiflexion [facility]  Ankle plantarflexion      Ankle inversion      Ankle eversion       (Blank rows = not tested)   LOWER EXTREMITY MMT:     MMT Right eval Left eval Right 02/11/22 Left 02/11/22 Right 05/12/22 Left 05/12/22  Hip flexion 4-* 4-* 4+ 4* 5 4+  Hip extension            Hip abduction 4+* 4-* 4+ 4+    Hip adduction 4* 4* 4+ 4+ 5 4+  Hip internal rotation 5 3* 5 4* 4+* 4+  Hip external rotation 5 4* 5 4* 5 4+  Knee flexion 5 4* 5 4+ 5 5-  Knee extension 5 4+ 5 4 5  4+  Ankle dorsiflexion 4+ 3+ 5 4 5  4+  Ankle plantarflexion             (Blank rows = not tested)     FUNCTIONAL TESTS:  5 times sit to stand: 43.37 seconds using arms.    02/11/22: 36.1 seconds.  03/04/22: 27.4 seconds.  6 minute walk test (performed 01/14/22): 280 feet.     02/11/22: 600 feet.  03/04/22: 735 feet. 10 meter walk test: 24.3 seconds (comfortable) = 0.41 m/s using RW; terminated fast-pace for safety.   02/11/22: 0.64 m/s comfortable speed, 0.65 m/s fast speed.  03/04/22: 0.65 m/s fast speed   STS mechanics: no UE support, use of momentum with rapid hip hinge to initiate sit to stand. Intermittent LLE buckling and varus/valgus immediately following initiation of sit to stand             -03/04/22: After 2 attempts pt able to perform sit to stand with no UE support, upward velocity of transfers slows down halfway through transfer with intermittent LLE varus/valgus   GAIT: 03/04/22: Distance walked: 735 feet Assistive device utilized: [facility] Level of assistance: standby assist Comments: good heel to toe pattern, intermittent LLE buckling requiring pt holding wall and intermittently placing hands on his L thigh for support; no assist required from PT to prevent fall     Injury screen 04/08/22 Homan's sign: Negative. Ankle inversion and eversion stress: Negative Dorsiflexion-eversion test: Negative   No significant color change, erythema, ecchymosis, venous distension, or girth asymmetry along calf between R and LLE. Pt has non-pitting edema along distal tibia medially       TODAY'S TREATMENT    SUBJECTIVE: Patient reports pain with abrupt bending over this morning with flare-up of low back pain, bilateral lumbar paraspinal region and axially in lower lumbar spine. Patient reports feeling pain "into my intestines" as well with this flare-up. He states his walking has been getting better over last few weeks.   PAIN:  Are you having pain? Yes, 4/10 at arrival  Pain location: bilateral lumbar paraspinal region and axially in lower lumbar spine   MHP (unbilled) utilized during manual therapy for analgesic effect and improved soft tissue extensibility. X 5 minutes along low back, in prone lying with 2 pillows under pelvis    Manual Therapy - for symptom modulation, soft tissue sensitivity and mobility, joint mobility, ROM    (Prone lying with 2 pillows under pelvis)   STM and IASTM with Hypervolt along bilateral L3-S1 erector spinae x 10 minutes     Therapeutic Exercise - for improved soft tissue flexibility and extensibility as needed for ROM, improved strength as needed to improve performance of CKC activities/functional movements and prevention of fall during episode of LOB, progressive standing/weightbearing activity to improve capacity for community-level mobility with decreasing reliance on AD/external support.  Hooklying lower trunk rotations; x20 alternating R/L  Ambulate laps around gym; x 3, no AD  Total Gym; Level 22; L double-limb squat; x20 Total Gym; Level 22; L single-leg minisquat; 1x13, 1x8, 1x8 (completed "drop set" strategy for 3 sets due to L quad fatigability)   On blue agility ladder: high knee with opposite knee tap 4x D/B,   Forward mini-lunge, adjacent to treadmill arm support for UE assist prn; 2x15    *next visit* Lateral mini-lunge; 2x8, careful  guarding on left side with PT TRX single leg squat to chair; chair +  Airex pad; 2x8 SciFit seated elliptical; Level 6.5; seat at 10, 5 minutes - subjective information gathered during this time Unipedal stance on floor 3x20 sec  Seated physioball Tband row with 5-sec isometric; 2x10, Blue Tband; cueing for upright posture Seated physioball march, alternating arms and legs; 2x10, alternating   *not today* Forward step up to step tap on 2nd step; 2x10; staircase in center of gym. Performed with 8-lb Dbell bilat UE  STS with x1 airex pad under RLE to improve LLE strength: 2x10 with 8-lb medicine ball;  Close SBA overall but CGA on second set with VC's for anterior weight shift to assist in standing due to LE fatigue.   Open-gate, on blue agility ladder; 3x D/B Heel raises: unilateral 1x15, 1x8 L LE, bilateral hand support on railing Unipedal stance on foam x multiple attempts Resisted walking forward and laterally in // bars. X2 Black resistance tubing. CGA, x5 laps each direction  Lateral L sided 6" step up: 1x12, 1x10 Lateral side steps alternating hurdle heights. 5# AW's. X6 laps forwards, x6 laps lateral. Intermittent UE support needed on // bar for support. SBA.  Standing marches with 4# ankle weights 2 x 1 minute, PT used hand as target for L hip flexion; pt unilateral hand support on railing     HOME EXERCISE PROGRAM: Access Code [email or web address]       ASSESSMENT:   CLINICAL IMPRESSION: Patient had flare-up this AM following abrupt combined lumbar flexion and thoracolumbar lateral flexion with pain along bilateral lumbar paraspinal region and axial lower lumbar spine. Utilized moist heat and STM/IASTM for mitigation of symptoms; pt was able to resume exercise today at lower volume. Pt left the clinic today with improved gait pattern, lessening antalgic quality and improved tolerance of upright standing. Pt has remaining deficits in decreased LLE strength, motor fatigability of LLE  antigravity musculature, intermittent gait instability with buckling of L knee, intermittent L flank and hip pain, L hip and lateral thigh sensitivity. Pt will continue to benefit from skilled PT services to address his noted deficits and improve function.     OBJECTIVE IMPAIRMENTS Abnormal gait, decreased activity tolerance, decreased balance, decreased endurance, decreased mobility, difficulty walking, decreased ROM, decreased strength, decreased safety awareness, impaired flexibility, impaired sensation, postural dysfunction, and pain.    ACTIVITY LIMITATIONS carrying, lifting, bending, sitting, standing, squatting, stairs, and transfers   PARTICIPATION LIMITATIONS: cleaning, laundry, driving, shopping, community activity, occupation, and yard work   PERSONAL FACTORS Time since onset of injury/illness/exacerbation and 1-2 comorbidities: schizophrenia and anxiety  are also affecting patient's functional outcome.    REHAB POTENTIAL: Good   CLINICAL DECISION MAKING: Evolving/moderate complexity   EVALUATION COMPLEXITY: Moderate     GOALS: Goals reviewed with patient? Yes   SHORT TERM GOALS: Target date: 01/21/2022   Patient will be independent in home exercise program to improve strength/mobility for better functional independence with ADLs. Baseline:   02/11/22: Pt is compliant with his HEP  Goal status: ACHIEVED   2.  Pt will ambulate 200+ feet using RW without LOB or LLE buckling to demonstrate progression towards improved ambulatory endurance.  Baseline: 110ft .    02/11/22: Pt able to ambulate > 200 feet without LLE buckling Goal status: ACHIEVED   3.   Patient will deny any falls over past 4 weeks to demonstrate improved safety at home.  Baseline: 4 falls since surgery.   02/11/22: 2 falls in June.   03/04/22: No falls in previous month.  03/25/22: Pt last had fall in  home 03/22/22.  04/15/22: One fall in home about 3.5 weeks ago.  05/12/22: No falls since 03/22/22  Goal status:  ACHIEVED     LONG TERM GOALS: Target date: 03/04/2022   Patient will increase FOTO score to equal to or greater than 41 to demonstrate statistically significant improvement in mobility and quality of life.  Baseline: 01/07/22: 23/41.  7/11/2: 44/41.  04/15/22: 52/41 Goal status: ACHIEVED   2.   Patient (< 36 years old) will complete five times sit to stand test in < 10 seconds indicating an increased LE strength and improved balance. Baseline: 01/07/22: 43 seconds.   02/11/22: 36.1 sec.  03/04/22: 27.4 seconds.    03/18/22: 21.6 seconds.  04/15/22: 16 seconds.    05/12/22: 16.9 sec Goal status: IN PROGRESS   3.  Patient will increase 10 meter walk test to >1.9m/s as to improve gait speed for better community ambulation and to reduce fall risk. Baseline: 01/07/22: 0.41 m/s.   02/11/22: 0.65 m/s.    03/04/22: 0.65 m/s fast speed   03/18/22: 0.90 m/s fast speed.   04/15/22: 0.90 m/s 05/12/22: 0.96 m/s Goal status: IN PROGRESS   4.  Patient will increase six minute walk test distance to >1000 for progression to community ambulator and improve gait ability. Baseline: 01/14/22: 280 feet.  02/11/22: 600 feet.   03/04/22: 735 feet.   03/18/22: 840 feet.   04/15/22: 840 feet.  05/12/22: 875 feet  Goal status: IN PROGRESS   5.  Patient will increase LLE gross strength to 4/5 as to improve functional strength for independent gait, increased standing tolerance and increased ADL ability. Baseline: 01/07/22: ranging 3-4/5, please see objective.   02/11/22: 4 to 4+/5 MMTs, see above.  05/12/22: 4+ to 5/5 MMTs, see chart above Goal status: ACHIEVED         PLAN: PT FREQUENCY: 2x/week   PT DURATION: 4-5 weeks   PLANNED INTERVENTIONS: Therapeutic exercises, Therapeutic activity, Neuromuscular re-education, Balance training, Gait training, Patient/Family education, Joint mobilization, Stair training, DME instructions, Dry Needling, Cryotherapy, Moist heat, and Manual therapy.   PLAN FOR NEXT SESSION: LE strengthening, trunk  stabilization, LE flexibility. Progress with increasing emphasis on standing/weightbearing activity and continued AD wean as able from [facility] to no AD. Modalities and manual therapy for pain modulation as needed. Continue PT with increasing exercise intensity as able with careful CGA/SBA to prevent fall.      Consuela Mimes, PT, DPT #[id number]  Gertie Exon 12:00 PM 06/04/2022

## 2022-06-06 ENCOUNTER — Encounter: Payer: Self-pay | Admitting: Physical Therapy

## 2022-06-09 ENCOUNTER — Ambulatory Visit: Payer: Medicare Other | Admitting: Physical Therapy

## 2022-06-09 NOTE — Therapy (Deleted)
OUTPATIENT PHYSICAL THERAPY TREATMENT   Patient Name: Justin Nixon MRN: [id number] DOB:1976/07/19, 46 y.o., male Today's Date: 04/24/2022   END OF SESSION:         Past Medical History:  Diagnosis Date   Anxiety    Arthritis    GERD (gastroesophageal reflux disease)    H/O   Hyperlipidemia    Schizophrenia (HCC)    Past Surgical History:  Procedure Laterality Date   ANTERIOR LATERAL LUMBAR FUSION WITH PERCUTANEOUS SCREW 1 LEVEL N/A 11/18/2021   Procedure: L4-5 LATERAL LUMBAR INTERBODY FUSION WITH POSTERIOR SPINAL FUSION;  Surgeon: Venetia Night, MD;  Location: [facility];  Service: Neurosurgery;  Laterality: N/A;   APPLICATION OF INTRAOPERATIVE CT SCAN N/A 11/18/2021   Procedure: APPLICATION OF INTRAOPERATIVE CT SCAN;  Surgeon: Venetia Night, MD;  Location: [facility];  Service: Neurosurgery;  Laterality: N/A;   COLONOSCOPY     CYST EXCISION     ELBOW ARTHROSCOPY Left 10/10/2020   Procedure: LEFT ELBOW ARTHROSCOPY WITH DEBRIDEMENT AND EXCISION OF LOOSE BODIES;  Surgeon: Christena Flake, MD;  Location: [facility];  Service: Orthopedics;  Laterality: Left;   MYRINGOTOMY WITH TUBE PLACEMENT     ORIF ELBOW FRACTURE Left 08/28/2021   Procedure: OPEN REDUCTION INTERNAL FIXATION (ORIF) ELBOW/OLECRANON FRACTURE;  Surgeon: Christena Flake, MD;  Location: [facility];  Service: Orthopedics;  Laterality: Left;   SHOULDER ARTHROSCOPY Left 03/18/2017   Procedure: ARTHROSCOPY SHOULDER with debridement decompression and distal clavicle excision;  Surgeon: Christena Flake, MD;  Location: [facility];  Service: Orthopedics;  Laterality: Left;   SHOULDER ARTHROSCOPY WITH OPEN ROTATOR CUFF REPAIR Right 09/23/2016   Procedure: SHOULDER ARTHROSCOPY WITH OPEN ROTATOR CUFF REPAIR;  Surgeon: Christena Flake, MD;  Location: [facility];  Service: Orthopedics;  Laterality: Right;   SHOULDER ARTHROSCOPY WITH SUBACROMIAL DECOMPRESSION Right 09/23/2016   Procedure: SHOULDER ARTHROSCOPY WITH SUBACROMIAL  DECOMPRESSION AND DEBRIDEMENT;  Surgeon: Christena Flake, MD;  Location: [facility];  Service: Orthopedics;  Laterality: Right;   Patient Active Problem List   Diagnosis Date Noted   S/P spinal fusion 11/18/2021   Olecranon fracture, left, open type I or II, initial encounter 08/28/2021      PCP: Gracelyn Nurse MD REFERRING PROVIDER: Venetia Night, MD     REFERRING DIAG: Z98.1 (ICD-10-CM) - History of lumbar fusion   THERAPY DIAG:  Muscle weakness (generalized)   S/P lumbar fusion   Abnormality of gait and mobility   Unsteadiness on feet   Rationale for Evaluation and Treatment Rehabilitation   PERTINENT HISTORY: Pt presents to physical therapy for evaluation of low back and LE pain and weakness L>R, following L4-L5 XLIF/PSF on 11/18/21. Pt states he was using a cane before surgery due to onset of LLE weakness. States he has now been using a RW since surgery. Pain ranges from a 5/10 to a 10/10. When aggravated pain is described as sharp and shooting into LLE; at baseline, pain is achy. Wearing clothes such as jeans is painful due to sensitivity of skin; at home wears pajamas. Pt has not returned to driving; he is limited to household distance ambulation or maximum into a doctors office. He feels as though his LLE is going to "give out" when he is walking or weightbearing. 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 Reports frequent falling since surgery due to LLE giving out; no injuries associated with falls.    PRECAUTIONS:  None at this time following 03/13/22 visit with Dr. Myer HaffYarbrough     OBJECTIVE: (objective measures completed at initial evaluation unless otherwise dated)   DIAGNOSTIC FINDINGS:  "Intraoperative images during L4-L5 anterior and posterior fusion. No evidence of immediate complication"   PATIENT SURVEYS:  Modified Oswestry (updated 01/09/22 due to incomplete  form): 62%.   02/11/22: 48%.  03/04/22: 28% FOTO: eval 23/41,   02/11/22: 44/41   SCREENING FOR RED FLAGS: Bowel or bladder incontinence: No Spinal tumors: No Cauda equina syndrome: No Compression fracture: No Abdominal aneurysm: No   COGNITION:           Overall cognitive status: Within functional limits for tasks assessed                          SENSATION: Light touch: Impaired  - increased sensitivity in left L2 and L3 dermatomes    MUSCLE LENGTH: Hamstrings: unable to test due to increase in sharp/shooting pain in hip flexors prior to HS stretch being felt.  (01/28/22): R lacking 30 deg, L lacking 40 deg.   Thomas test: NT   POSTURE: decreased lumbar lordosis and posterior pelvic tilt   PALPATION: Right: hip flexors and lateral hip, glute med, piriformis, deep external rotators, QL Left: hip flexors, lateral hip and adductors (from proximal hip to mid-thigh), proximal hamstrings, L4-distal paraspinals, QL, all gluteals   OBSERVATION 01/21/22: No bruising, ecchymosis, increased tissue temperature, or erythema observed along L flank, L gluteal region, or adjacent to surgical incisions. No sign of infection or delayed healing   LUMBAR ROM: not assessed due to back precautions    Active  A/PROM  eval  Flexion -  Extension -  Right lateral flexion -  Left lateral flexion -  Right rotation -  Left rotation -   (Blank rows = not tested)   LOWER EXTREMITY ROM:      Passive  Right eval Left eval  Hip flexion 100 90  Hip extension NT NT  Hip abduction 20 20  Hip adduction      Hip internal rotation      Hip external rotation      Knee [facility]  Knee extension [facility]  Ankle dorsiflexion [facility]  Ankle plantarflexion      Ankle inversion      Ankle eversion       (Blank rows = not tested)   LOWER EXTREMITY MMT:     MMT Right eval Left eval Right 02/11/22 Left 02/11/22 Right 05/12/22 Left 05/12/22  Hip flexion 4-* 4-* 4+ 4* 5 4+  Hip extension             Hip abduction 4+* 4-* 4+ 4+    Hip adduction 4* 4* 4+ 4+ 5 4+  Hip internal rotation 5 3* 5 4* 4+* 4+  Hip external rotation 5 4* 5 4* 5 4+  Knee flexion 5 4* 5 4+ 5 5-  Knee extension 5 4+ 5 4 5  4+  Ankle dorsiflexion 4+ 3+ 5 4 5  4+  Ankle plantarflexion             (Blank rows = not tested)     FUNCTIONAL TESTS:  5 times sit to stand: 43.37 seconds using arms.    02/11/22: 36.1 seconds.  03/04/22: 27.4 seconds.  6 minute walk test (performed 01/14/22): 280 feet.     02/11/22: 600 feet.  03/04/22: 735 feet. 10 meter walk test: 24.3 seconds (comfortable) =  0.41 m/s using RW; terminated fast-pace for safety.   02/11/22: 0.64 m/s comfortable speed, 0.65 m/s fast speed.  03/04/22: 0.65 m/s fast speed   STS mechanics: no UE support, use of momentum with rapid hip hinge to initiate sit to stand. Intermittent LLE buckling and varus/valgus immediately following initiation of sit to stand             -03/04/22: After 2 attempts pt able to perform sit to stand with no UE support, upward velocity of transfers slows down halfway through transfer with intermittent LLE varus/valgus   GAIT: 03/04/22: Distance walked: 735 feet Assistive device utilized: [facility] Level of assistance: standby assist Comments: good heel to toe pattern, intermittent LLE buckling requiring pt holding wall and intermittently placing hands on his L thigh for support; no assist required from PT to prevent fall     Injury screen 04/08/22 Homan's sign: Negative. Ankle inversion and eversion stress: Negative Dorsiflexion-eversion test: Negative  No significant color change, erythema, ecchymosis, venous distension, or girth asymmetry along calf between R and LLE. Pt has non-pitting edema along distal tibia medially       TODAY'S TREATMENT    SUBJECTIVE: Patient reports pain with abrupt bending over this morning with flare-up of low back pain, bilateral lumbar paraspinal region and axially in lower lumbar spine. Patient reports feeling pain  "into my intestines" as well with this flare-up. He states his walking has been getting better over last few weeks.   PAIN:  Are you having pain? Yes, 4/10 at arrival  Pain location: bilateral lumbar paraspinal region and axially in lower lumbar spine   MHP (unbilled) utilized during manual therapy for analgesic effect and improved soft tissue extensibility. X 5 minutes along low back, in prone lying with 2 pillows under pelvis    Manual Therapy - for symptom modulation, soft tissue sensitivity and mobility, joint mobility, ROM    (Prone lying with 2 pillows under pelvis)   STM and IASTM with Hypervolt along bilateral L3-S1 erector spinae x 10 minutes     Therapeutic Exercise - for improved soft tissue flexibility and extensibility as needed for ROM, improved strength as needed to improve performance of CKC activities/functional movements and prevention of fall during episode of LOB, progressive standing/weightbearing activity to improve capacity for community-level mobility with decreasing reliance on AD/external support.  SciFit seated elliptical; Level 6.5; seat at 10, 5 minutes - subjective information gathered during this time  Hooklying lower trunk rotations; x20 alternating R/L  Ambulate laps around gym; x 3, no AD  Total Gym; Level 22; L double-limb squat; x20 Total Gym; Level 22; L single-leg minisquat; 1x13, 1x8, 1x8 (completed "drop set" strategy for 3 sets due to L quad fatigability)   On blue agility ladder: high knee with opposite knee tap 4x D/B,   Forward mini-lunge, adjacent to treadmill arm support for UE assist prn; 2x15  Lateral mini-lunge; 2x8, careful guarding on left side with PT TRX single leg squat to chair; chair + Airex pad; 2x8  Unipedal stance on floor 3x20 sec  Seated physioball Tband row with 5-sec isometric; 2x10, Blue Tband; cueing for upright posture Seated physioball march, alternating arms and legs; 2x10, alternating   *not today* Forward  step up to step tap on 2nd step; 2x10; staircase in center of gym. Performed with 8-lb Dbell bilat UE  STS with x1 airex pad under RLE to improve LLE strength: 2x10 with 8-lb medicine ball;  Close SBA overall but CGA on second set with  VC's for anterior weight shift to assist in standing due to LE fatigue.   Open-gate, on blue agility ladder; 3x D/B Heel raises: unilateral 1x15, 1x8 L LE, bilateral hand support on railing Unipedal stance on foam x multiple attempts Resisted walking forward and laterally in // bars. X2 Black resistance tubing. CGA, x5 laps each direction  Lateral L sided 6" step up: 1x12, 1x10 Lateral side steps alternating hurdle heights. 5# AW's. X6 laps forwards, x6 laps lateral. Intermittent UE support needed on // bar for support. SBA.  Standing marches with 4# ankle weights 2 x 1 minute, PT used hand as target for L hip flexion; pt unilateral hand support on railing     HOME EXERCISE PROGRAM: Access Code [email or web address]       ASSESSMENT:   CLINICAL IMPRESSION: Patient had flare-up this AM following abrupt combined lumbar flexion and thoracolumbar lateral flexion with pain along bilateral lumbar paraspinal region and axial lower lumbar spine. Utilized moist heat and STM/IASTM for mitigation of symptoms; pt was able to resume exercise today at lower volume. Pt left the clinic today with improved gait pattern, lessening antalgic quality and improved tolerance of upright standing. Pt has remaining deficits in decreased LLE strength, motor fatigability of LLE antigravity musculature, intermittent gait instability with buckling of L knee, intermittent L flank and hip pain, L hip and lateral thigh sensitivity. Pt will continue to benefit from skilled PT services to address his noted deficits and improve function.     OBJECTIVE IMPAIRMENTS Abnormal gait, decreased activity tolerance, decreased balance, decreased endurance, decreased mobility, difficulty walking, decreased ROM,  decreased strength, decreased safety awareness, impaired flexibility, impaired sensation, postural dysfunction, and pain.    ACTIVITY LIMITATIONS carrying, lifting, bending, sitting, standing, squatting, stairs, and transfers   PARTICIPATION LIMITATIONS: cleaning, laundry, driving, shopping, community activity, occupation, and yard work   PERSONAL FACTORS Time since onset of injury/illness/exacerbation and 1-2 comorbidities: schizophrenia and anxiety  are also affecting patient's functional outcome.    REHAB POTENTIAL: Good   CLINICAL DECISION MAKING: Evolving/moderate complexity   EVALUATION COMPLEXITY: Moderate     GOALS: Goals reviewed with patient? Yes   SHORT TERM GOALS: Target date: 01/21/2022   Patient will be independent in home exercise program to improve strength/mobility for better functional independence with ADLs. Baseline:   02/11/22: Pt is compliant with his HEP  Goal status: ACHIEVED   2.  Pt will ambulate 200+ feet using RW without LOB or LLE buckling to demonstrate progression towards improved ambulatory endurance.  Baseline: 1103ft .    02/11/22: Pt able to ambulate > 200 feet without LLE buckling Goal status: ACHIEVED   3.   Patient will deny any falls over past 4 weeks to demonstrate improved safety at home.  Baseline: 4 falls since surgery.   02/11/22: 2 falls in June.   03/04/22: No falls in previous month.  03/25/22: Pt last had fall in home 03/22/22.  04/15/22: One fall in home about 3.5 weeks ago.  05/12/22: No falls since 03/22/22  Goal status: ACHIEVED     LONG TERM GOALS: Target date: 03/04/2022   Patient will increase FOTO score to equal to or greater than 41 to demonstrate statistically significant improvement in mobility and quality of life.  Baseline: 01/07/22: 23/41.  7/11/2: 44/41.  04/15/22: 52/41 Goal status: ACHIEVED   2.   Patient (< 57 years old) will complete five times sit to stand test in < 10 seconds indicating an increased LE strength and improved  balance. Baseline:  01/07/22: 43 seconds.   02/11/22: 36.1 sec.  03/04/22: 27.4 seconds.    03/18/22: 21.6 seconds.  04/15/22: 16 seconds.    05/12/22: 16.9 sec Goal status: IN PROGRESS   3.  Patient will increase 10 meter walk test to >1.4m/s as to improve gait speed for better community ambulation and to reduce fall risk. Baseline: 01/07/22: 0.41 m/s.   02/11/22: 0.65 m/s.    03/04/22: 0.65 m/s fast speed   03/18/22: 0.90 m/s fast speed.   04/15/22: 0.90 m/s 05/12/22: 0.96 m/s Goal status: IN PROGRESS   4.  Patient will increase six minute walk test distance to >1000 for progression to community ambulator and improve gait ability. Baseline: 01/14/22: 280 feet.  02/11/22: 600 feet.   03/04/22: 735 feet.   03/18/22: 840 feet.   04/15/22: 840 feet.  05/12/22: 875 feet  Goal status: IN PROGRESS   5.  Patient will increase LLE gross strength to 4/5 as to improve functional strength for independent gait, increased standing tolerance and increased ADL ability. Baseline: 01/07/22: ranging 3-4/5, please see objective.   02/11/22: 4 to 4+/5 MMTs, see above.  05/12/22: 4+ to 5/5 MMTs, see chart above Goal status: ACHIEVED         PLAN: PT FREQUENCY: 2x/week   PT DURATION: 4-5 weeks   PLANNED INTERVENTIONS: Therapeutic exercises, Therapeutic activity, Neuromuscular re-education, Balance training, Gait training, Patient/Family education, Joint mobilization, Stair training, DME instructions, Dry Needling, Cryotherapy, Moist heat, and Manual therapy.   PLAN FOR NEXT SESSION: LE strengthening, trunk stabilization, LE flexibility. Progress with increasing emphasis on standing/weightbearing activity and continued AD wean as able from [facility] to no AD. Modalities and manual therapy for pain modulation as needed. Continue PT with increasing exercise intensity as able with careful CGA/SBA to prevent fall.      Valentina Gu, PT, DPT #[id number]  Eilleen Kempf 9:39 AM 06/09/2022

## 2022-06-11 ENCOUNTER — Ambulatory Visit: Payer: Medicare Other | Admitting: Physical Therapy

## 2022-06-11 ENCOUNTER — Encounter: Payer: Self-pay | Admitting: Physical Therapy

## 2022-06-11 DIAGNOSIS — M6281 Muscle weakness (generalized): Secondary | ICD-10-CM

## 2022-06-11 DIAGNOSIS — R269 Unspecified abnormalities of gait and mobility: Secondary | ICD-10-CM

## 2022-06-11 DIAGNOSIS — R2681 Unsteadiness on feet: Secondary | ICD-10-CM

## 2022-06-11 DIAGNOSIS — M545 Low back pain, unspecified: Secondary | ICD-10-CM

## 2022-06-11 DIAGNOSIS — Z981 Arthrodesis status: Secondary | ICD-10-CM

## 2022-06-11 NOTE — Therapy (Signed)
OUTPATIENT PHYSICAL THERAPY TREATMENT   Patient Name: Justin Nixon MRN: [id number] DOB:01/21/1976, 46 y.o., male Today's Date: 04/24/2022   END OF SESSION:   PT End of Session - 06/11/22 1154     Visit Number 42    Number of Visits 48    Date for PT Re-Evaluation 07/03/22    Progress Note Due on Visit 67    PT Start Time 1149    PT Stop Time 1235    PT Time Calculation (min) 46 min    Equipment Utilized During Treatment Gait belt    Activity Tolerance Patient tolerated treatment well    Behavior During Therapy WFL for tasks assessed/performed                  Past Medical History:  Diagnosis Date   Anxiety    Arthritis    GERD (gastroesophageal reflux disease)    H/O   Hyperlipidemia    Schizophrenia ([place])    Past Surgical History:  Procedure Laterality Date   ANTERIOR LATERAL LUMBAR FUSION WITH PERCUTANEOUS SCREW 1 LEVEL N/A 11/18/2021   Procedure: L4-5 LATERAL LUMBAR INTERBODY FUSION WITH POSTERIOR SPINAL FUSION;  Surgeon: Meade Maw, MD;  Location: [facility];  Service: Neurosurgery;  Laterality: N/A;   APPLICATION OF INTRAOPERATIVE CT SCAN N/A 11/18/2021   Procedure: APPLICATION OF INTRAOPERATIVE CT SCAN;  Surgeon: Meade Maw, MD;  Location: [facility];  Service: Neurosurgery;  Laterality: N/A;   COLONOSCOPY     CYST EXCISION     ELBOW ARTHROSCOPY Left 10/10/2020   Procedure: LEFT ELBOW ARTHROSCOPY WITH DEBRIDEMENT AND EXCISION OF LOOSE BODIES;  Surgeon: Corky Mull, MD;  Location: [facility];  Service: Orthopedics;  Laterality: Left;   MYRINGOTOMY WITH TUBE PLACEMENT     ORIF ELBOW FRACTURE Left 08/28/2021   Procedure: OPEN REDUCTION INTERNAL FIXATION (ORIF) ELBOW/OLECRANON FRACTURE;  Surgeon: Corky Mull, MD;  Location: [facility];  Service: Orthopedics;  Laterality: Left;   SHOULDER ARTHROSCOPY Left 03/18/2017   Procedure: ARTHROSCOPY SHOULDER with debridement decompression and distal clavicle excision;  Surgeon: Corky Mull, MD;  Location:  [place];  Service: Orthopedics;  Laterality: Left;   SHOULDER ARTHROSCOPY WITH OPEN ROTATOR CUFF REPAIR Right 09/23/2016   Procedure: SHOULDER ARTHROSCOPY WITH OPEN ROTATOR CUFF REPAIR;  Surgeon: Corky Mull, MD;  Location: [facility];  Service: Orthopedics;  Laterality: Right;   SHOULDER ARTHROSCOPY WITH SUBACROMIAL DECOMPRESSION Right 09/23/2016   Procedure: SHOULDER ARTHROSCOPY WITH SUBACROMIAL DECOMPRESSION AND DEBRIDEMENT;  Surgeon: Corky Mull, MD;  Location: [facility];  Service: Orthopedics;  Laterality: Right;   Patient Active Problem List   Diagnosis Date Noted   S/P spinal fusion 11/18/2021   Olecranon fracture, left, open type I or II, initial encounter 08/28/2021      PCP: Baxter Hire MD REFERRING PROVIDER: Meade Maw, MD     REFERRING DIAG: Z98.1 (ICD-10-CM) - History of lumbar fusion   THERAPY DIAG:  Muscle weakness (generalized)   S/P lumbar fusion   Abnormality of gait and mobility   Unsteadiness on feet   Rationale for Evaluation and Treatment Rehabilitation   PERTINENT HISTORY: Pt presents to physical therapy for evaluation of low back and LE pain and weakness L>R, following L4-L5 XLIF/PSF on 11/18/21. Pt states he was using a cane before surgery due to onset of LLE weakness. States he has now been using a RW since surgery. Pain ranges from a 5/10 to a 10/10. When aggravated pain is described as sharp and shooting  into LLE; at baseline, pain is achy. Wearing clothes such as jeans is painful due to sensitivity of skin; at home wears pajamas. Pt has not returned to driving; he is limited to household distance ambulation or maximum into a doctors office. He feels as though his LLE is going to "give out" when he is walking or weightbearing. 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 Reports frequent falling since surgery due to LLE giving out; no  injuries associated with falls.    PRECAUTIONS: None at this time following 03/13/22 visit with Dr. Izora Ribas     OBJECTIVE: (objective measures completed at initial evaluation unless otherwise dated)   DIAGNOSTIC FINDINGS:  "Intraoperative images during L4-L5 anterior and posterior fusion. No evidence of immediate complication"   PATIENT SURVEYS:  Modified Oswestry (updated 01/09/22 due to incomplete form): 62%.   02/11/22: 48%.  03/04/22: 28% FOTO: eval 23/41,   02/11/22: 44/41   SCREENING FOR RED FLAGS: Bowel or bladder incontinence: No Spinal tumors: No Cauda equina syndrome: No Compression fracture: No Abdominal aneurysm: No   COGNITION:           Overall cognitive status: Within functional limits for tasks assessed                          SENSATION: Light touch: Impaired  - increased sensitivity in left L2 and L3 dermatomes    MUSCLE LENGTH: Hamstrings: unable to test due to increase in sharp/shooting pain in hip flexors prior to HS stretch being felt.  (01/28/22): R lacking 30 deg, L lacking 40 deg.   Thomas test: NT   POSTURE: decreased lumbar lordosis and posterior pelvic tilt   PALPATION: Right: hip flexors and lateral hip, glute med, piriformis, deep external rotators, QL Left: hip flexors, lateral hip and adductors (from proximal hip to mid-thigh), proximal hamstrings, L4-distal paraspinals, QL, all gluteals   OBSERVATION 01/21/22: No bruising, ecchymosis, increased tissue temperature, or erythema observed along L flank, L gluteal region, or adjacent to surgical incisions. No sign of infection or delayed healing   LUMBAR ROM: not assessed due to back precautions    Active  A/PROM  eval  Flexion -  Extension -  Right lateral flexion -  Left lateral flexion -  Right rotation -  Left rotation -   (Blank rows = not tested)   LOWER EXTREMITY ROM:      Passive  Right eval Left eval  Hip flexion 100 90  Hip extension NT NT  Hip abduction 20 20  Hip adduction       Hip internal rotation      Hip external rotation      Knee flexion [facility]  Knee extension [facility]  Ankle dorsiflexion [facility]  Ankle plantarflexion      Ankle inversion      Ankle eversion       (Blank rows = not tested)   LOWER EXTREMITY MMT:     MMT Right eval Left eval Right 02/11/22 Left 02/11/22 Right 05/12/22 Left 05/12/22  Hip flexion 4-* 4-* 4+ 4* 5 4+  Hip extension            Hip abduction 4+* 4-* 4+ 4+    Hip adduction 4* 4* 4+ 4+ 5 4+  Hip internal rotation 5 3* 5 4* 4+* 4+  Hip external rotation 5 4* 5 4* 5 4+  Knee flexion 5 4* 5 4+ 5 5-  Knee extension 5 4+ 5 4 5  4+  Ankle dorsiflexion 4+ 3+ 5 4 5  4+  Ankle plantarflexion             (Blank rows = not tested)     FUNCTIONAL TESTS:  5 times sit to stand: 43.37 seconds using arms.    02/11/22: 36.1 seconds.  03/04/22: 27.4 seconds.  6 minute walk test (performed 01/14/22): 280 feet.     02/11/22: 600 feet.  03/04/22: 735 feet. 10 meter walk test: 24.3 seconds (comfortable) = 0.41 m/s using RW; terminated fast-pace for safety.   02/11/22: 0.64 m/s comfortable speed, 0.65 m/s fast speed.  03/04/22: 0.65 m/s fast speed   STS mechanics: no UE support, use of momentum with rapid hip hinge to initiate sit to stand. Intermittent LLE buckling and varus/valgus immediately following initiation of sit to stand             -03/04/22: After 2 attempts pt able to perform sit to stand with no UE support, upward velocity of transfers slows down halfway through transfer with intermittent LLE varus/valgus   GAIT: 03/04/22: Distance walked: 735 feet Assistive device utilized: [facility] Level of assistance: standby assist Comments: good heel to toe pattern, intermittent LLE buckling requiring pt holding wall and intermittently placing hands on his L thigh for support; no assist required from PT to prevent fall     Injury screen 04/08/22 Homan's sign: Negative. Ankle inversion and eversion stress: Negative Dorsiflexion-eversion test: Negative   No significant color change, erythema, ecchymosis, venous distension, or girth asymmetry along calf between R and LLE. Pt has non-pitting edema along distal tibia medially       TODAY'S TREATMENT    SUBJECTIVE: Patient reports [facility] up [facility] last week due to ongoing pain after flare-up last week with X-rays completed at the time. He has not heard about results yet. Patient reports no new numbness/paresthesias since flare-up. He does report his lower limbs felt "wobbly" when standing in bank after last visit. He reports tolerating exercise well last visit. Pt is awaiting follow-up with Dr. Izora Ribas.    PAIN:  Are you having pain? Yes, 5/10 at arrival  Pain location: bilateral lumbar paraspinal region and axially in lower lumbar spine   MHP (unbilled) utilized during e-stim for analgesic effect and improved soft tissue extensibility. X 10 minutes along low back, in prone lying with 2 pillows under pelvis    Premodulated E-stim - utilized for pain control to improve active participation in physical therapy, with concurrent use of MHP;  2 channels - 1 along L3-S1 R paraspinal and 1 along R glute. Continuous cycle time, beat low 80 Hz, beat high 150 Hz, intensity: 18 V. Duration: 10 minutes   4/10 pain following use of modalities; fleeting sharp pain with transfer off of table from prone lying    Therapeutic Exercise - for improved soft tissue flexibility and extensibility as needed for ROM, improved strength as needed to improve performance of CKC activities/functional movements and prevention of fall during episode of LOB, progressive standing/weightbearing activity to improve capacity for community-level mobility with decreasing reliance on AD/external support.  SciFit seated elliptical; Level 6.5; seat at 10, 5 minutes - subjective information gathered during this time  Hooklying lower trunk rotations; x20 alternating R/L  Supine piriformis stretch, dynamic; x20,  bilat, 1 sec hold   On blue agility ladder: high knee with opposite knee tap 2x D/B  Forward mini-lunge,  adjacent to treadmill arm support for UE assist prn; 1x10  Lateral mini-lunge; 1x10, careful guarding on left side with PT  Unipedal stance on floor 1x15 sec and 1x20 sec   Lower volume of exercise completed due to recent flare-up of R-sided back pain and notable pain at arrival today.    PATIENT EDUCATION: Discussed low likelihood of hardware issue given post-operative healing time and relatively low-impact activity at onset of pain. Re-assured pt that flare-up of pain will likely improve gradually over 2-3 weeks from onset.    *not today* Total Gym; Level 22; L double-limb squat; x20 Total Gym; Level 22; L single-leg minisquat; 1x13, 1x8, 1x8 (completed "drop set" strategy for 3 sets due to L quad fatigability)  TRX single leg squat to chair; chair + Airex pad; 2x8 Seated physioball Tband row with 5-sec isometric; 2x10, Blue Tband; cueing for upright posture Seated physioball march, alternating arms and legs; 2x10, alternating Ambulate laps around gym; x 3, no AD Forward step up to step tap on 2nd step; 2x10; staircase in center of gym. Performed with 8-lb Dbell bilat UE  STS with x1 airex pad under RLE to improve LLE strength: 2x10 with 8-lb medicine ball;  Close SBA overall but CGA on second set with VC's for anterior weight shift to assist in standing due to LE fatigue.  Open-gate, on blue agility ladder; 3x D/B Heel raises: unilateral 1x15, 1x8 L LE, bilateral hand support on railing Unipedal stance on foam x multiple attempts Resisted walking forward and laterally in // bars. X2 Black resistance tubing. CGA, x5 laps each direction  Lateral L sided 6" step up: 1x12, 1x10 Lateral side steps alternating hurdle heights. 5# AW's. X6 laps forwards, x6 laps lateral. Intermittent UE support needed on // bar for support. SBA.  Standing marches with 4# ankle weights 2 x 1 minute, PT  used hand as target for L hip flexion; pt unilateral hand support on railing     HOME EXERCISE PROGRAM: Access Code [email or web address]       ASSESSMENT:   CLINICAL IMPRESSION: [facility] up [facility] due to ongoing pain after flare-up last week that started when pt abruptly bent over with combined lateral flexion and had rapid increase in R flank pain (most of patient's pain earlier in this episode of care was left lower quarter). Pt has remaining 4-5/10 pain today. Pain is improved by only one point on NPRS with use of modalities today. Pt is able to participate well with closed-chain drills today without exacerbation of symptoms. Pt is awaiting follow-up with referring provider concerning this flare-up of R-sided low back pain. Pt has remaining deficits in decreased LLE strength, motor fatigability of LLE antigravity musculature, intermittent gait instability with buckling of L knee, intermittent L flank and hip pain, L hip and lateral thigh sensitivity. Pt will continue to benefit from skilled PT services to address his noted deficits and improve function.     OBJECTIVE IMPAIRMENTS Abnormal gait, decreased activity tolerance, decreased balance, decreased endurance, decreased mobility, difficulty walking, decreased ROM, decreased strength, decreased safety awareness, impaired flexibility, impaired sensation, postural dysfunction, and pain.    ACTIVITY LIMITATIONS carrying, lifting, bending, sitting, standing, squatting, stairs, and transfers   PARTICIPATION LIMITATIONS: cleaning, laundry, driving, shopping, community activity, occupation, and yard work   PERSONAL FACTORS Time since onset of injury/illness/exacerbation and 1-2 comorbidities: schizophrenia and anxiety  are also affecting patient's functional outcome.    REHAB POTENTIAL: Good   CLINICAL DECISION MAKING: Evolving/moderate complexity  EVALUATION COMPLEXITY: Moderate     GOALS: Goals reviewed with patient? Yes    SHORT TERM GOALS: Target date: 01/21/2022   Patient will be independent in home exercise program to improve strength/mobility for better functional independence with ADLs. Baseline:   02/11/22: Pt is compliant with his HEP  Goal status: ACHIEVED   2.  Pt will ambulate 200+ feet using RW without LOB or LLE buckling to demonstrate progression towards improved ambulatory endurance.  Baseline: 112ft .    02/11/22: Pt able to ambulate > 200 feet without LLE buckling Goal status: ACHIEVED   3.   Patient will deny any falls over past 4 weeks to demonstrate improved safety at home.  Baseline: 4 falls since surgery.   02/11/22: 2 falls in June.   03/04/22: No falls in previous month.  03/25/22: Pt last had fall in home 03/22/22.  04/15/22: One fall in home about 3.5 weeks ago.  05/12/22: No falls since 03/22/22  Goal status: ACHIEVED     LONG TERM GOALS: Target date: 03/04/2022   Patient will increase FOTO score to equal to or greater than 41 to demonstrate statistically significant improvement in mobility and quality of life.  Baseline: 01/07/22: 23/41.  7/11/2: 44/41.  04/15/22: 52/41 Goal status: ACHIEVED   2.   Patient (< 36 years old) will complete five times sit to stand test in < 10 seconds indicating an increased LE strength and improved balance. Baseline: 01/07/22: 43 seconds.   02/11/22: 36.1 sec.  03/04/22: 27.4 seconds.    03/18/22: 21.6 seconds.  04/15/22: 16 seconds.    05/12/22: 16.9 sec Goal status: IN PROGRESS   3.  Patient will increase 10 meter walk test to >1.47m/s as to improve gait speed for better community ambulation and to reduce fall risk. Baseline: 01/07/22: 0.41 m/s.   02/11/22: 0.65 m/s.    03/04/22: 0.65 m/s fast speed   03/18/22: 0.90 m/s fast speed.   04/15/22: 0.90 m/s 05/12/22: 0.96 m/s Goal status: IN PROGRESS   4.  Patient will increase six minute walk test distance to >1000 for progression to community ambulator and improve gait ability. Baseline: 01/14/22: 280 feet.  02/11/22: 600 feet.    03/04/22: 735 feet.   03/18/22: 840 feet.   04/15/22: 840 feet.  05/12/22: 875 feet  Goal status: IN PROGRESS   5.  Patient will increase LLE gross strength to 4/5 as to improve functional strength for independent gait, increased standing tolerance and increased ADL ability. Baseline: 01/07/22: ranging 3-4/5, please see objective.   02/11/22: 4 to 4+/5 MMTs, see above.  05/12/22: 4+ to 5/5 MMTs, see chart above Goal status: ACHIEVED         PLAN: PT FREQUENCY: 2x/week   PT DURATION: 4-5 weeks   PLANNED INTERVENTIONS: Therapeutic exercises, Therapeutic activity, Neuromuscular re-education, Balance training, Gait training, Patient/Family education, Joint mobilization, Stair training, DME instructions, Dry Needling, Cryotherapy, Moist heat, and Manual therapy.   PLAN FOR NEXT SESSION: LE strengthening, trunk stabilization, LE flexibility. Progress with increasing emphasis on standing/weightbearing activity and continued AD wean as able from [facility] to no AD. Modalities and manual therapy for pain modulation as needed. Continue PT with increasing exercise intensity as able with careful CGA/SBA to prevent fall.      Valentina Gu, PT, DPT [id number]  Eilleen Kempf 1:07 PM 06/11/2022

## 2022-06-13 ENCOUNTER — Telehealth: Payer: Self-pay

## 2022-06-13 NOTE — Telephone Encounter (Signed)
We have not received anything.  I just contacted Justin Nixon in [place] and asked her to fax [place] the report and send the images through [place]. Once we receive them, I will send Dr Myer Haff a message to review them.

## 2022-06-13 NOTE — Telephone Encounter (Signed)
No xray report has come over yet. I did tell the patient when he called that it might be Monday or Tuesday before he received a call back from our office, he agreed.

## 2022-06-13 NOTE — Telephone Encounter (Signed)
-----   Message from Rockey Situ sent at 06/13/2022  1:35 PM EST ----- Regarding: xrays from [facility] Contact: [phone] 11/18/21 L4-5 XLIF/PSF Patient calling that he went to see Dr.Johnston at [facility] on 11/6 because he twisted too hard and hurt his back. They did xrays. He told KC to send Dr.Yarbrough the images. Has Dr.Yarbrough had a chance to review them? Is there anything Dr.Yarbrough recommends patient to do?

## 2022-06-16 ENCOUNTER — Ambulatory Visit: Payer: Medicare Other | Admitting: Physical Therapy

## 2022-06-16 ENCOUNTER — Ambulatory Visit
Admission: RE | Admit: 2022-06-16 | Discharge: 2022-06-16 | Disposition: A | Discharge status: Home / Self Care | Payer: Self-pay | Source: Ambulatory Visit | Attending: Neurosurgery | Admitting: Neurosurgery

## 2022-06-16 ENCOUNTER — Encounter: Payer: Self-pay | Admitting: Physical Therapy

## 2022-06-16 ENCOUNTER — Other Ambulatory Visit: Payer: Self-pay

## 2022-06-16 DIAGNOSIS — Z049 Encounter for examination and observation for unspecified reason: Secondary | ICD-10-CM

## 2022-06-16 DIAGNOSIS — Z981 Arthrodesis status: Secondary | ICD-10-CM

## 2022-06-16 DIAGNOSIS — M545 Low back pain, unspecified: Secondary | ICD-10-CM

## 2022-06-16 DIAGNOSIS — M6281 Muscle weakness (generalized): Secondary | ICD-10-CM | POA: Diagnosis not present

## 2022-06-16 DIAGNOSIS — R2681 Unsteadiness on feet: Secondary | ICD-10-CM

## 2022-06-16 DIAGNOSIS — R269 Unspecified abnormalities of gait and mobility: Secondary | ICD-10-CM

## 2022-06-16 NOTE — Therapy (Signed)
OUTPATIENT PHYSICAL THERAPY TREATMENT   Patient Name: Justin Nixon MRN: [id number] DOB:[birth year], 46 y.o., male Today's Date: 04/24/2022   END OF SESSION:   PT End of Session - 06/16/22 1157     Visit Number 43    Number of Visits 48    Date for PT Re-Evaluation 07/03/22    Progress Note Due on Visit 40    PT Start Time 1151    PT Stop Time 1249    PT Time Calculation (min) 58 min    Equipment Utilized During Treatment Gait belt    Activity Tolerance Patient tolerated treatment well    Behavior During Therapy WFL for tasks assessed/performed               Past Medical History:  Diagnosis Date   Anxiety    Arthritis    GERD (gastroesophageal reflux disease)    H/O   Hyperlipidemia    Schizophrenia (HCC)    Past Surgical History:  Procedure Laterality Date   ANTERIOR LATERAL LUMBAR FUSION WITH PERCUTANEOUS SCREW 1 LEVEL N/A 11/18/2021   Procedure: L4-5 LATERAL LUMBAR INTERBODY FUSION WITH POSTERIOR SPINAL FUSION;  Surgeon: Venetia Night, MD;  Location: [facility];  Service: Neurosurgery;  Laterality: N/A;   APPLICATION OF INTRAOPERATIVE CT SCAN N/A 11/18/2021   Procedure: APPLICATION OF INTRAOPERATIVE CT SCAN;  Surgeon: Venetia Night, MD;  Location: [facility];  Service: Neurosurgery;  Laterality: N/A;   COLONOSCOPY     CYST EXCISION     ELBOW ARTHROSCOPY Left 10/10/2020   Procedure: LEFT ELBOW ARTHROSCOPY WITH DEBRIDEMENT AND EXCISION OF LOOSE BODIES;  Surgeon: Christena Flake, MD;  Location: [facility];  Service: Orthopedics;  Laterality: Left;   MYRINGOTOMY WITH TUBE PLACEMENT     ORIF ELBOW FRACTURE Left 08/28/2021   Procedure: OPEN REDUCTION INTERNAL FIXATION (ORIF) ELBOW/OLECRANON FRACTURE;  Surgeon: Christena Flake, MD;  Location: [facility];  Service: Orthopedics;  Laterality: Left;   SHOULDER ARTHROSCOPY Left 03/18/2017   Procedure: ARTHROSCOPY SHOULDER with debridement decompression and distal clavicle excision;  Surgeon: Christena Flake, MD;  Location: [facility];  Service: Orthopedics;  Laterality: Left;   SHOULDER ARTHROSCOPY WITH OPEN ROTATOR CUFF REPAIR Right 09/23/2016   Procedure: SHOULDER ARTHROSCOPY WITH OPEN ROTATOR CUFF REPAIR;  Surgeon: Christena Flake, MD;  Location: [facility];  Service: Orthopedics;  Laterality: Right;   SHOULDER ARTHROSCOPY WITH SUBACROMIAL DECOMPRESSION Right 09/23/2016   Procedure: SHOULDER ARTHROSCOPY WITH SUBACROMIAL DECOMPRESSION AND DEBRIDEMENT;  Surgeon: Christena Flake, MD;  Location: [facility];  Service: Orthopedics;  Laterality: Right;   Patient Active Problem List   Diagnosis Date Noted   S/P spinal fusion 11/18/2021   Olecranon fracture, left, open type I or II, initial encounter 08/28/2021      PCP: Gracelyn Nurse MD REFERRING PROVIDER: Venetia Night, MD     REFERRING DIAG: Z98.1 (ICD-10-CM) - History of lumbar fusion   THERAPY DIAG:  Muscle weakness (generalized)   S/P lumbar fusion   Abnormality of gait and mobility   Unsteadiness on feet   Rationale for Evaluation and Treatment Rehabilitation   PERTINENT HISTORY: Pt presents to physical therapy for evaluation of low back and LE pain and weakness L>R, following L4-L5 XLIF/PSF on 11/18/21. Pt states he was using a cane before surgery due to onset of LLE weakness. States he has now been using a RW since surgery. Pain ranges from a 5/10 to a 10/10. When aggravated pain is described as sharp and shooting into LLE; at  baseline, pain is achy. Wearing clothes such as jeans is painful due to sensitivity of skin; at home wears pajamas. Pt has not returned to driving; he is limited to household distance ambulation or maximum into a doctors office. He feels as though his LLE is going to "give out" when he is walking or weightbearing. 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 Reports frequent falling since surgery due to LLE giving out; no  injuries associated with falls.    PRECAUTIONS: None at this time following 03/13/22 visit with Dr. Myer Haff     OBJECTIVE: (objective measures completed at initial evaluation unless otherwise dated)   DIAGNOSTIC FINDINGS:  "Intraoperative images during L4-L5 anterior and posterior fusion. No evidence of immediate complication"   PATIENT SURVEYS:  Modified Oswestry (updated 01/09/22 due to incomplete form): 62%.   02/11/22: 48%.  03/04/22: 28% FOTO: eval 23/41,   02/11/22: 44/41   SCREENING FOR RED FLAGS: Bowel or bladder incontinence: No Spinal tumors: No Cauda equina syndrome: No Compression fracture: No Abdominal aneurysm: No   COGNITION:           Overall cognitive status: Within functional limits for tasks assessed                          SENSATION: Light touch: Impaired  - increased sensitivity in left L2 and L3 dermatomes    MUSCLE LENGTH: Hamstrings: unable to test due to increase in sharp/shooting pain in hip flexors prior to HS stretch being felt.  (01/28/22): R lacking 30 deg, L lacking 40 deg.   Thomas test: NT   POSTURE: decreased lumbar lordosis and posterior pelvic tilt   PALPATION: Right: hip flexors and lateral hip, glute med, piriformis, deep external rotators, QL Left: hip flexors, lateral hip and adductors (from proximal hip to mid-thigh), proximal hamstrings, L4-distal paraspinals, QL, all gluteals   OBSERVATION 01/21/22: No bruising, ecchymosis, increased tissue temperature, or erythema observed along L flank, L gluteal region, or adjacent to surgical incisions. No sign of infection or delayed healing   LUMBAR ROM: not assessed due to back precautions    Active  A/PROM  eval  Flexion -  Extension -  Right lateral flexion -  Left lateral flexion -  Right rotation -  Left rotation -   (Blank rows = not tested)   LOWER EXTREMITY ROM:      Passive  Right eval Left eval  Hip flexion 100 90  Hip extension NT NT  Hip abduction 20 20  Hip adduction       Hip internal rotation      Hip external rotation      Knee flexion [facility]  Knee extension [facility]  Ankle dorsiflexion [facility]  Ankle plantarflexion      Ankle inversion      Ankle eversion       (Blank rows = not tested)   LOWER EXTREMITY MMT:     MMT Right eval Left eval Right 02/11/22 Left 02/11/22 Right 05/12/22 Left 05/12/22  Hip flexion 4-* 4-* 4+ 4* 5 4+  Hip extension            Hip abduction 4+* 4-* 4+ 4+    Hip adduction 4* 4* 4+ 4+ 5 4+  Hip internal rotation 5 3* 5 4* 4+* 4+  Hip external rotation 5 4* 5 4* 5 4+  Knee flexion  5 4* 5 4+ 5 5-  Knee extension 5 4+ 5 4 5  4+  Ankle dorsiflexion 4+ 3+ 5 4 5  4+  Ankle plantarflexion             (Blank rows = not tested)     FUNCTIONAL TESTS:  5 times sit to stand: 43.37 seconds using arms.    02/11/22: 36.1 seconds.  03/04/22: 27.4 seconds.  6 minute walk test (performed 01/14/22): 280 feet.     02/11/22: 600 feet.  03/04/22: 735 feet. 10 meter walk test: 24.3 seconds (comfortable) = 0.41 m/s using RW; terminated fast-pace for safety.   02/11/22: 0.64 m/s comfortable speed, 0.65 m/s fast speed.  03/04/22: 0.65 m/s fast speed   STS mechanics: no UE support, use of momentum with rapid hip hinge to initiate sit to stand. Intermittent LLE buckling and varus/valgus immediately following initiation of sit to stand             -03/04/22: After 2 attempts pt able to perform sit to stand with no UE support, upward velocity of transfers slows down halfway through transfer with intermittent LLE varus/valgus   GAIT: 03/04/22: Distance walked: 735 feet Assistive device utilized: [facility] Level of assistance: standby assist Comments: good heel to toe pattern, intermittent LLE buckling requiring pt holding wall and intermittently placing hands on his L thigh for support; no assist required from PT to prevent fall     Injury screen 04/08/22 Homan's sign: Negative. Ankle inversion and eversion stress: Negative Dorsiflexion-eversion test: Negative   No significant color change, erythema, ecchymosis, venous distension, or girth asymmetry along calf between R and LLE. Pt has non-pitting edema along distal tibia medially       TODAY'S TREATMENT    SUBJECTIVE: Patient reports pain intermittently up to 7/10 in R flank, but it can then settle down to 4-5/10. Patient is awaiting further f/u with Dr. Myer HaffYarbrough; recent radiographs have not gotten to his office yet. Pt has reached out to radiologic office regarding this, and he has yet to hear back. Patient reports he has ordered TENS unit from [facility] for symptom management. Pt feels that he is tolerating lying exercises at home well.    PAIN:  Are you having pain? Yes, 4/10 at arrival  Pain location: bilateral lumbar paraspinal region and axially in lower lumbar spine   MHP (unbilled) utilized during e-stim for analgesic effect and improved soft tissue extensibility. X 10 minutes along low back, in prone lying with 2 pillows under pelvis    Premodulated E-stim - utilized for pain control to improve active participation in physical therapy, with concurrent use of MHP;  2 channels - 1 along L3-S1 R paraspinal and 1 along R glute. Continuous cycle time, beat low 80 Hz, beat high 150 Hz, intensity: 18.5 V at R paraspinal, 16.5 V at R gluteal region. Duration: 10 minutes   Pt reports minimal pain following use of modalities while in lying    Therapeutic Exercise - for improved soft tissue flexibility and extensibility as needed for ROM, improved strength as needed to improve performance of CKC activities/functional movements and prevention of fall during episode of LOB, progressive standing/weightbearing activity to improve capacity for community-level mobility with decreasing reliance on AD/external support.  SciFit seated elliptical; Level 6.5; seat at 10, 5 minutes - subjective information gathered during this time intermittently, 2 minutes unbilled  Supine piriformis stretch, dynamic; x20,  bilat, 1 sec hold   On blue agility ladder: high knee with opposite knee tap  2x D/B, hip opener 2x D/B alternating R/L  Forward mini-lunge, adjacent to treadmill arm support for UE assist prn; 1x12, performed bilaterally  Lateral mini-lunge; 1x12, careful guarding on left side with PT, performed bilaterally  Forward step up with bilateral dumbbell hold; 2x15, 8-lb Dbells bilat UE  TRX single leg squat to chair, on LLE; chair + Airex pad; 2x10  Unipedal stance on floor 2x30 sec bilat, intermittent UE light touch on // bars as needed    PATIENT EDUCATION: Discussed low likelihood of hardware issue given post-operative healing time and relatively low-impact activity at onset of pain.   *not today* Hooklying lower trunk rotations; x20 alternating R/L Total Gym; Level 22; L double-limb squat; x20 Total Gym; Level 22; L single-leg minisquat; 1x13, 1x8, 1x8 (completed "drop set" strategy for 3 sets due to L quad fatigability)  Seated physioball Tband row with 5-sec isometric; 2x10, Blue Tband; cueing for upright posture Seated physioball march, alternating arms and legs; 2x10, alternating Ambulate laps around gym; x 3, no AD Forward step up to step tap on 2nd step; 2x10; staircase in center of gym. Performed with 8-lb Dbell bilat UE  STS with x1 airex pad under RLE to improve LLE strength: 2x10 with 8-lb medicine ball;  Close SBA overall but CGA on second set with VC's for anterior weight shift to assist in standing due to LE fatigue.  Open-gate, on blue agility ladder; 3x D/B Heel raises: unilateral 1x15, 1x8 L LE, bilateral hand support on railing Unipedal stance on foam x multiple attempts Resisted walking forward and laterally in // bars. X2 Black resistance tubing. CGA, x5 laps each direction  Lateral L sided 6" step up: 1x12, 1x10 Lateral side steps alternating hurdle heights. 5# AW's. X6 laps forwards, x6 laps lateral. Intermittent UE support needed on // bar for support. SBA.   Standing marches with 4# ankle weights 2 x 1 minute, PT used hand as target for L hip flexion; pt unilateral hand support on railing     HOME EXERCISE PROGRAM: Access Code [email or web address]       ASSESSMENT:   CLINICAL IMPRESSION: Patient has ongoing pain in R flank at similar intensity that is worse with pt moving/transferring. Pt has minimal pain at rest while in lying today. Modalities do help in short-term, but R flank pain returns with activity. Pt has made excellent progress with left LE strength and gait stability; unfortunately, he also has increase in R lower quarter pain that has been limiting him functionally over the past week. Pt is awaiting findings from recent X-ray for this, and he is awaiting further f/u with Dr. Myer Haff. Pt has remaining deficits in decreased LLE strength, motor fatigability of LLE antigravity musculature, intermittent gait instability with buckling of L knee, intermittent L flank and hip pain, L hip and lateral thigh sensitivity. Pt will continue to benefit from skilled PT services to address his noted deficits and improve function.     OBJECTIVE IMPAIRMENTS Abnormal gait, decreased activity tolerance, decreased balance, decreased endurance, decreased mobility, difficulty walking, decreased ROM, decreased strength, decreased safety awareness, impaired flexibility, impaired sensation, postural dysfunction, and pain.    ACTIVITY LIMITATIONS carrying, lifting, bending, sitting, standing, squatting, stairs, and transfers   PARTICIPATION LIMITATIONS: cleaning, laundry, driving, shopping, community activity, occupation, and yard work   PERSONAL FACTORS Time since onset of injury/illness/exacerbation and 1-2 comorbidities: schizophrenia and anxiety  are also affecting patient's functional outcome.    REHAB POTENTIAL: Good   CLINICAL DECISION MAKING: Evolving/moderate complexity  EVALUATION COMPLEXITY: Moderate     GOALS: Goals reviewed with patient? Yes    SHORT TERM GOALS: Target date: 01/21/2022   Patient will be independent in home exercise program to improve strength/mobility for better functional independence with ADLs. Baseline:   02/11/22: Pt is compliant with his HEP  Goal status: ACHIEVED   2.  Pt will ambulate 200+ feet using RW without LOB or LLE buckling to demonstrate progression towards improved ambulatory endurance.  Baseline: 162ft .    02/11/22: Pt able to ambulate > 200 feet without LLE buckling Goal status: ACHIEVED   3.   Patient will deny any falls over past 4 weeks to demonstrate improved safety at home.  Baseline: 4 falls since surgery.   02/11/22: 2 falls in June.   03/04/22: No falls in previous month.  03/25/22: Pt last had fall in home 03/22/22.  04/15/22: One fall in home about 3.5 weeks ago.  05/12/22: No falls since 03/22/22  Goal status: ACHIEVED     LONG TERM GOALS: Target date: 03/04/2022   Patient will increase FOTO score to equal to or greater than 41 to demonstrate statistically significant improvement in mobility and quality of life.  Baseline: 01/07/22: 23/41.  7/11/2: 44/41.  04/15/22: 52/41 Goal status: ACHIEVED   2.   Patient (< 38 years old) will complete five times sit to stand test in < 10 seconds indicating an increased LE strength and improved balance. Baseline: 01/07/22: 43 seconds.   02/11/22: 36.1 sec.  03/04/22: 27.4 seconds.    03/18/22: 21.6 seconds.  04/15/22: 16 seconds.    05/12/22: 16.9 sec Goal status: IN PROGRESS   3.  Patient will increase 10 meter walk test to >1.44m/s as to improve gait speed for better community ambulation and to reduce fall risk. Baseline: 01/07/22: 0.41 m/s.   02/11/22: 0.65 m/s.    03/04/22: 0.65 m/s fast speed   03/18/22: 0.90 m/s fast speed.   04/15/22: 0.90 m/s 05/12/22: 0.96 m/s Goal status: IN PROGRESS   4.  Patient will increase six minute walk test distance to >1000 for progression to community ambulator and improve gait ability. Baseline: 01/14/22: 280 feet.  02/11/22: 600 feet.    03/04/22: 735 feet.   03/18/22: 840 feet.   04/15/22: 840 feet.  05/12/22: 875 feet  Goal status: IN PROGRESS   5.  Patient will increase LLE gross strength to 4/5 as to improve functional strength for independent gait, increased standing tolerance and increased ADL ability. Baseline: 01/07/22: ranging 3-4/5, please see objective.   02/11/22: 4 to 4+/5 MMTs, see above.  05/12/22: 4+ to 5/5 MMTs, see chart above Goal status: ACHIEVED         PLAN: PT FREQUENCY: 2x/week   PT DURATION: 4-5 weeks   PLANNED INTERVENTIONS: Therapeutic exercises, Therapeutic activity, Neuromuscular re-education, Balance training, Gait training, Patient/Family education, Joint mobilization, Stair training, DME instructions, Dry Needling, Cryotherapy, Moist heat, and Manual therapy.   PLAN FOR NEXT SESSION: LE strengthening, trunk stabilization, LE flexibility. Progress with increasing emphasis on standing/weightbearing activity and continued AD wean as able from [place] to no AD. Modalities and manual therapy for pain modulation as needed. Continue PT with increasing exercise intensity as able with careful CGA/SBA to prevent fall.  F/u on any update from radiology or Dr. Myer Haff regarding R flank pain     Consuela Mimes, PT, DPT [phone]  Gertie Exon 1:06 PM 06/16/2022

## 2022-06-16 NOTE — Telephone Encounter (Signed)
Xrays received so patient confirmed appt for 06/17/2022.

## 2022-06-17 ENCOUNTER — Encounter: Payer: Self-pay | Admitting: Neurosurgery

## 2022-06-17 ENCOUNTER — Ambulatory Visit: Payer: Medicare Other | Admitting: Neurosurgery

## 2022-06-17 VITALS — BP 148/88 | Ht 72.0 in | Wt 150.6 lb

## 2022-06-17 DIAGNOSIS — Z09 Encounter for follow-up examination after completed treatment for conditions other than malignant neoplasm: Secondary | ICD-10-CM | POA: Diagnosis not present

## 2022-06-17 DIAGNOSIS — M5416 Radiculopathy, lumbar region: Secondary | ICD-10-CM

## 2022-06-17 DIAGNOSIS — Z981 Arthrodesis status: Secondary | ICD-10-CM

## 2022-06-17 DIAGNOSIS — M4310 Spondylolisthesis, site unspecified: Secondary | ICD-10-CM | POA: Diagnosis not present

## 2022-06-17 NOTE — Progress Notes (Signed)
   DOS: 11/18/21 (L4-5 XLIF/PSF)  HISTORY OF PRESENT ILLNESS: 06/17/2022 Mr. Sailor Haughn is status post the above surgery.  He had a slight exacerbation of his pain after twisting.  He is taking Tylenol for pain.    He continues to do physical therapy.  PHYSICAL EXAMINATION:   Vitals:   06/17/22 1320  BP: (!) 148/88   General: Patient is well developed, well nourished, calm, collected, and in no apparent distress.  NEUROLOGICAL:  General: In no acute distress.  Awake, alert, oriented to person, place, and time. Pupils equal round and reactive to light.   Strength:  Side Iliopsoas Quads Hamstring PF DF EHL  R 5 5 5 5 5 5   L 5 5 5 5 5 5    Incision c/d/i   ROS (Neurologic): Negative except as noted above  IMAGING: No complications noted  ASSESSMENT/PLAN:  Daimien Patmon is doing well after lumbar spine surgery.  He has had a small exacerbation.  I suspect this will get better with time.  I encouraged him to consider ibuprofen in addition to Tylenol.  I also encouraged him to quit smoking.  I will see him back in approximately 4 months.   I spent a total of 15 minutes in face-to-face and non-face-to-face activities related to this patient's care today.   MD, [facility] Department of Neurosurgery

## 2022-06-18 ENCOUNTER — Ambulatory Visit: Payer: Medicare Other | Admitting: Physical Therapy

## 2022-06-18 DIAGNOSIS — R269 Unspecified abnormalities of gait and mobility: Secondary | ICD-10-CM

## 2022-06-18 DIAGNOSIS — Z981 Arthrodesis status: Secondary | ICD-10-CM

## 2022-06-18 DIAGNOSIS — M6281 Muscle weakness (generalized): Secondary | ICD-10-CM | POA: Diagnosis not present

## 2022-06-18 DIAGNOSIS — M545 Low back pain, unspecified: Secondary | ICD-10-CM

## 2022-06-18 DIAGNOSIS — R2681 Unsteadiness on feet: Secondary | ICD-10-CM

## 2022-06-18 NOTE — Therapy (Signed)
OUTPATIENT PHYSICAL THERAPY TREATMENT   Patient Name: Justin Nixon MRN: [id number] DOB:Dec 03, 1975, 46 y.o., male Today's Date: 04/24/2022   END OF SESSION:   PT End of Session - 06/21/22 2053     Visit Number 44    Number of Visits 48    Date for PT Re-Evaluation 07/03/22    Progress Note Due on Visit 1    PT Start Time 1146    PT Stop Time 1230    PT Time Calculation (min) 44 min    Equipment Utilized During Treatment Gait belt    Activity Tolerance Patient tolerated treatment well    Behavior During Therapy WFL for tasks assessed/performed                Past Medical History:  Diagnosis Date   Anxiety    Arthritis    GERD (gastroesophageal reflux disease)    H/O   Hyperlipidemia    Schizophrenia ([place])    Past Surgical History:  Procedure Laterality Date   ANTERIOR LATERAL LUMBAR FUSION WITH PERCUTANEOUS SCREW 1 LEVEL N/A 11/18/2021   Procedure: L4-5 LATERAL LUMBAR INTERBODY FUSION WITH POSTERIOR SPINAL FUSION;  Surgeon: Meade Maw, MD;  Location: [facility];  Service: Neurosurgery;  Laterality: N/A;   APPLICATION OF INTRAOPERATIVE CT SCAN N/A 11/18/2021   Procedure: APPLICATION OF INTRAOPERATIVE CT SCAN;  Surgeon: Meade Maw, MD;  Location: [facility];  Service: Neurosurgery;  Laterality: N/A;   COLONOSCOPY     CYST EXCISION     ELBOW ARTHROSCOPY Left 10/10/2020   Procedure: LEFT ELBOW ARTHROSCOPY WITH DEBRIDEMENT AND EXCISION OF LOOSE BODIES;  Surgeon: Corky Mull, MD;  Location: [facility];  Service: Orthopedics;  Laterality: Left;   MYRINGOTOMY WITH TUBE PLACEMENT     ORIF ELBOW FRACTURE Left 08/28/2021   Procedure: OPEN REDUCTION INTERNAL FIXATION (ORIF) ELBOW/OLECRANON FRACTURE;  Surgeon: Corky Mull, MD;  Location: [facility];  Service: Orthopedics;  Laterality: Left;   SHOULDER ARTHROSCOPY Left 03/18/2017   Procedure: ARTHROSCOPY SHOULDER with debridement decompression and distal clavicle excision;  Surgeon: Corky Mull, MD;  Location: [facility];  Service: Orthopedics;  Laterality: Left;   SHOULDER ARTHROSCOPY WITH OPEN ROTATOR CUFF REPAIR Right 09/23/2016   Procedure: SHOULDER ARTHROSCOPY WITH OPEN ROTATOR CUFF REPAIR;  Surgeon: Corky Mull, MD;  Location: [facility];  Service: Orthopedics;  Laterality: Right;   SHOULDER ARTHROSCOPY WITH SUBACROMIAL DECOMPRESSION Right 09/23/2016   Procedure: SHOULDER ARTHROSCOPY WITH SUBACROMIAL DECOMPRESSION AND DEBRIDEMENT;  Surgeon: Corky Mull, MD;  Location: [facility];  Service: Orthopedics;  Laterality: Right;   Patient Active Problem List   Diagnosis Date Noted   S/P spinal fusion 11/18/2021   Olecranon fracture, left, open type I or II, initial encounter 08/28/2021      PCP: Baxter Hire MD REFERRING PROVIDER: Meade Maw, MD     REFERRING DIAG: Z98.1 (ICD-10-CM) - History of lumbar fusion   THERAPY DIAG:  Muscle weakness (generalized)   S/P lumbar fusion   Abnormality of gait and mobility   Unsteadiness on feet   Rationale for Evaluation and Treatment Rehabilitation   PERTINENT HISTORY: Pt presents to physical therapy for evaluation of low back and LE pain and weakness L>R, following L4-L5 XLIF/PSF on 11/18/21. Pt states he was using a cane before surgery due to onset of LLE weakness. States he has now been using a RW since surgery. Pain ranges from a 5/10 to a 10/10. When aggravated pain is described as sharp and shooting into LLE;  at baseline, pain is achy. Wearing clothes such as jeans is painful due to sensitivity of skin; at home wears pajamas. Pt has not returned to driving; he is limited to household distance ambulation or maximum into a doctors office. He feels as though his LLE is going to "give out" when he is walking or weightbearing. 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 Reports frequent falling since surgery due to LLE giving out; no  injuries associated with falls.    PRECAUTIONS: None at this time following 03/13/22 visit with Dr. Izora Ribas     OBJECTIVE: (objective measures completed at initial evaluation unless otherwise dated)   DIAGNOSTIC FINDINGS:  "Intraoperative images during L4-L5 anterior and posterior fusion. No evidence of immediate complication"   PATIENT SURVEYS:  Modified Oswestry (updated 01/09/22 due to incomplete form): 62%.   02/11/22: 48%.  03/04/22: 28% FOTO: eval 23/41,   02/11/22: 44/41   SCREENING FOR RED FLAGS: Bowel or bladder incontinence: No Spinal tumors: No Cauda equina syndrome: No Compression fracture: No Abdominal aneurysm: No   COGNITION:           Overall cognitive status: Within functional limits for tasks assessed                          SENSATION: Light touch: Impaired  - increased sensitivity in left L2 and L3 dermatomes    MUSCLE LENGTH: Hamstrings: unable to test due to increase in sharp/shooting pain in hip flexors prior to HS stretch being felt.  (01/28/22): R lacking 30 deg, L lacking 40 deg.   Thomas test: NT   POSTURE: decreased lumbar lordosis and posterior pelvic tilt   PALPATION: Right: hip flexors and lateral hip, glute med, piriformis, deep external rotators, QL Left: hip flexors, lateral hip and adductors (from proximal hip to mid-thigh), proximal hamstrings, L4-distal paraspinals, QL, all gluteals   OBSERVATION 01/21/22: No bruising, ecchymosis, increased tissue temperature, or erythema observed along L flank, L gluteal region, or adjacent to surgical incisions. No sign of infection or delayed healing   LUMBAR ROM: not assessed due to back precautions    Active  A/PROM  eval  Flexion -  Extension -  Right lateral flexion -  Left lateral flexion -  Right rotation -  Left rotation -   (Blank rows = not tested)   LOWER EXTREMITY ROM:      Passive  Right eval Left eval  Hip flexion 100 90  Hip extension NT NT  Hip abduction 20 20  Hip adduction       Hip internal rotation      Hip external rotation      Knee flexion [facility]  Knee extension [facility]  Ankle dorsiflexion [facility]  Ankle plantarflexion      Ankle inversion      Ankle eversion       (Blank rows = not tested)   LOWER EXTREMITY MMT:     MMT Right eval Left eval Right 02/11/22 Left 02/11/22 Right 05/12/22 Left 05/12/22  Hip flexion 4-* 4-* 4+ 4* 5 4+  Hip extension            Hip abduction 4+* 4-* 4+ 4+    Hip adduction 4* 4* 4+ 4+ 5 4+  Hip internal rotation 5 3* 5 4* 4+* 4+  Hip external rotation 5 4* 5 4* 5 4+  Knee  flexion 5 4* 5 4+ 5 5-  Knee extension 5 4+ 5 4 5  4+  Ankle dorsiflexion 4+ 3+ 5 4 5  4+  Ankle plantarflexion             (Blank rows = not tested)     FUNCTIONAL TESTS:  5 times sit to stand: 43.37 seconds using arms.    02/11/22: 36.1 seconds.  03/04/22: 27.4 seconds.  6 minute walk test (performed 01/14/22): 280 feet.     02/11/22: 600 feet.  03/04/22: 735 feet. 10 meter walk test: 24.3 seconds (comfortable) = 0.41 m/s using RW; terminated fast-pace for safety.   02/11/22: 0.64 m/s comfortable speed, 0.65 m/s fast speed.  03/04/22: 0.65 m/s fast speed   STS mechanics: no UE support, use of momentum with rapid hip hinge to initiate sit to stand. Intermittent LLE buckling and varus/valgus immediately following initiation of sit to stand             -03/04/22: After 2 attempts pt able to perform sit to stand with no UE support, upward velocity of transfers slows down halfway through transfer with intermittent LLE varus/valgus   GAIT: 03/04/22: Distance walked: 735 feet Assistive device utilized: [facility] Level of assistance: standby assist Comments: good heel to toe pattern, intermittent LLE buckling requiring pt holding wall and intermittently placing hands on his L thigh for support; no assist required from PT to prevent fall     Injury screen 04/08/22 Homan's sign: Negative. Ankle inversion and eversion stress: Negative Dorsiflexion-eversion test: Negative   No significant color change, erythema, ecchymosis, venous distension, or girth asymmetry along calf between R and LLE. Pt has non-pitting edema along distal tibia medially       TODAY'S TREATMENT    SUBJECTIVE: Patient reports ongoing pain affecting R flank. Pt denies significant left lower quarter symptoms at this time. He reports doing better with his walking, but R flank is continuing. Pt had f/u with his referring MD, and f/u X-rays showed there were no mechanical issues with hardware. Pt states that his physician believed it could be pulled muscle.    PAIN:  Are you having pain? Yes, 4/10 at arrival  Pain location: R flank    Manual Therapy - for symptom modulation, soft tissue sensitivity and mobility; as needed for improved ability to participate in active PT intervnetion    STM with use of Biofreeze for topical analgesic effect; R lumbar erector spinae L3-S1, R QL; x 10 minutes   Therapeutic Exercise - for improved soft tissue flexibility and extensibility as needed for ROM, improved strength as needed to improve performance of CKC activities/functional movements and prevention of fall during episode of LOB, progressive standing/weightbearing activity to improve capacity for community-level mobility with decreasing reliance on AD/external support.  SciFit seated elliptical; Level 6.5; seat at 10, 5 minutes - subjective information gathered during this time intermittently, 1 minute unbilled  Lower trunk rotations; x10 alternating R/L   Blue physioball gentle rollout; x5 ea dir, 3-way (anterior and anterolateral R/L)  On blue agility ladder: high knee with opposite knee tap 2x D/B, hip opener 2x D/B alternating R/L  Forward mini-lunge, adjacent to treadmill arm support for UE assist prn; 1x12, performed bilaterally  Lateral mini-lunge; 1x12, careful guarding on left side with PT, performed bilaterally  Forward step up with bilateral dumbbell hold; 2x15, 8-lb Dbells bilat  UE  TRX single leg squat to chair, on LLE; chair + Airex pad; 2x10  Unipedal stance on floor 2x30 sec bilat,  intermittent UE light touch on // bars as needed     *not today* Supine piriformis stretch, dynamic; x20, bilat, 1 sec hold  Hooklying lower trunk rotations; x20 alternating R/L Total Gym; Level 22; L double-limb squat; x20 Total Gym; Level 22; L single-leg minisquat; 1x13, 1x8, 1x8 (completed "drop set" strategy for 3 sets due to L quad fatigability)  Seated physioball Tband row with 5-sec isometric; 2x10, Blue Tband; cueing for upright posture Seated physioball march, alternating arms and legs; 2x10, alternating Ambulate laps around gym; x 3, no AD Forward step up to step tap on 2nd step; 2x10; staircase in center of gym. Performed with 8-lb Dbell bilat UE  STS with x1 airex pad under RLE to improve LLE strength: 2x10 with 8-lb medicine ball;  Close SBA overall but CGA on second set with VC's for anterior weight shift to assist in standing due to LE fatigue.  Open-gate, on blue agility ladder; 3x D/B Heel raises: unilateral 1x15, 1x8 L LE, bilateral hand support on railing Unipedal stance on foam x multiple attempts Resisted walking forward and laterally in // bars. X2 Black resistance tubing. CGA, x5 laps each direction  Lateral L sided 6" step up: 1x12, 1x10 Lateral side steps alternating hurdle heights. 5# AW's. X6 laps forwards, x6 laps lateral. Intermittent UE support needed on // bar for support. SBA.  Standing marches with 4# ankle weights 2 x 1 minute, PT used hand as target for L hip flexion; pt unilateral hand support on railing     HOME EXERCISE PROGRAM: Access Code [email or web address]       ASSESSMENT:   CLINICAL IMPRESSION: Patient had f/u with MD; there were no concerns for hardware in his spine and that his symptoms could be due to muscular strain per pt secondary report. Pt has improving L-sided pain and LE weakness. He is able to resume standing activity and CKC  strengthening in clinic. He has ongoing R flank pain that affects him more with walking and thoracolumbar AROM. Utilized manual therapy to improve sensitivity of affected tissues to allow for improved participation in active PT. Pt has remaining deficits in decreased LLE strength, motor fatigability of LLE antigravity musculature, intermittent gait instability with buckling of L knee, intermittent L flank and hip pain, L hip and lateral thigh sensitivity. Pt will continue to benefit from skilled PT services to address his noted deficits and improve function.     OBJECTIVE IMPAIRMENTS Abnormal gait, decreased activity tolerance, decreased balance, decreased endurance, decreased mobility, difficulty walking, decreased ROM, decreased strength, decreased safety awareness, impaired flexibility, impaired sensation, postural dysfunction, and pain.    ACTIVITY LIMITATIONS carrying, lifting, bending, sitting, standing, squatting, stairs, and transfers   PARTICIPATION LIMITATIONS: cleaning, laundry, driving, shopping, community activity, occupation, and yard work   PERSONAL FACTORS Time since onset of injury/illness/exacerbation and 1-2 comorbidities: schizophrenia and anxiety  are also affecting patient's functional outcome.    REHAB POTENTIAL: Good   CLINICAL DECISION MAKING: Evolving/moderate complexity   EVALUATION COMPLEXITY: Moderate     GOALS: Goals reviewed with patient? Yes   SHORT TERM GOALS: Target date: 01/21/2022   Patient will be independent in home exercise program to improve strength/mobility for better functional independence with ADLs. Baseline:   02/11/22: Pt is compliant with his HEP  Goal status: ACHIEVED   2.  Pt will ambulate 200+ feet using RW without LOB or LLE buckling to demonstrate progression towards improved ambulatory endurance.  Baseline: 1102ft .    02/11/22: Pt able to ambulate >  200 feet without LLE buckling Goal status: ACHIEVED   3.   Patient will deny any falls  over past 4 weeks to demonstrate improved safety at home.  Baseline: 4 falls since surgery.   02/11/22: 2 falls in June.   03/04/22: No falls in previous month.  03/25/22: Pt last had fall in home 03/22/22.  04/15/22: One fall in home about 3.5 weeks ago.  05/12/22: No falls since 03/22/22  Goal status: ACHIEVED     LONG TERM GOALS: Target date: 03/04/2022   Patient will increase FOTO score to equal to or greater than 41 to demonstrate statistically significant improvement in mobility and quality of life.  Baseline: 01/07/22: 23/41.  7/11/2: 44/41.  04/15/22: 52/41 Goal status: ACHIEVED   2.   Patient (< 57 years old) will complete five times sit to stand test in < 10 seconds indicating an increased LE strength and improved balance. Baseline: 01/07/22: 43 seconds.   02/11/22: 36.1 sec.  03/04/22: 27.4 seconds.    03/18/22: 21.6 seconds.  04/15/22: 16 seconds.    05/12/22: 16.9 sec Goal status: IN PROGRESS   3.  Patient will increase 10 meter walk test to >1.20m/s as to improve gait speed for better community ambulation and to reduce fall risk. Baseline: 01/07/22: 0.41 m/s.   02/11/22: 0.65 m/s.    03/04/22: 0.65 m/s fast speed   03/18/22: 0.90 m/s fast speed.   04/15/22: 0.90 m/s 05/12/22: 0.96 m/s Goal status: IN PROGRESS   4.  Patient will increase six minute walk test distance to >1000 for progression to community ambulator and improve gait ability. Baseline: 01/14/22: 280 feet.  02/11/22: 600 feet.   03/04/22: 735 feet.   03/18/22: 840 feet.   04/15/22: 840 feet.  05/12/22: 875 feet  Goal status: IN PROGRESS   5.  Patient will increase LLE gross strength to 4/5 as to improve functional strength for independent gait, increased standing tolerance and increased ADL ability. Baseline: 01/07/22: ranging 3-4/5, please see objective.   02/11/22: 4 to 4+/5 MMTs, see above.  05/12/22: 4+ to 5/5 MMTs, see chart above Goal status: ACHIEVED         PLAN: PT FREQUENCY: 2x/week   PT DURATION: 4-5 weeks   PLANNED INTERVENTIONS:  Therapeutic exercises, Therapeutic activity, Neuromuscular re-education, Balance training, Gait training, Patient/Family education, Joint mobilization, Stair training, DME instructions, Dry Needling, Cryotherapy, Moist heat, and Manual therapy.   PLAN FOR NEXT SESSION: LE strengthening, trunk stabilization, LE flexibility. Progress with increasing emphasis on standing/weightbearing activity and continued AD wean as able from [facility] to no AD. Modalities and manual therapy for pain modulation as needed. Continue PT with increasing exercise intensity as able with careful CGA/SBA to prevent fall.  F/u on any update from radiology or Dr. Izora Ribas regarding R flank pain     Valentina Gu, PT, DPT [phone]  Justin Nixon 8:54 PM 06/21/2022

## 2022-07-07 ENCOUNTER — Ambulatory Visit: Payer: Medicare Other | Admitting: Physical Therapy

## 2022-09-16 ENCOUNTER — Telehealth: Payer: Self-pay

## 2022-09-16 MED ORDER — GABAPENTIN 300 MG PO CAPS: 300.0000 mg | ORAL_CAPSULE | Freq: Every day | ORAL | 0 refills | Status: DC

## 2022-09-16 NOTE — Telephone Encounter (Signed)
Patient notified of refill and any other refill need to come from [place], he agreed.

## 2022-09-16 NOTE — Telephone Encounter (Signed)
-----   Message from Peggyann Shoals sent at 09/16/2022  1:31 PM EST ----- Regarding: med refill Contact: [phone] 11/18/21 L4-5 XLIF/PSF [place] Gabapentin 300mg  only at night Appt on 10/14/2022

## 2022-09-16 NOTE — Telephone Encounter (Signed)
Dr Izora Ribas said we can refill this time, but he would prefer future refills to come from Dr Edwina Barth. Refill sent for 30 days.

## 2022-10-14 ENCOUNTER — Ambulatory Visit: Payer: Self-pay | Admitting: Neurosurgery

## 2022-10-14 ENCOUNTER — Other Ambulatory Visit: Payer: Self-pay | Admitting: Neurosurgery

## 2022-11-17 ENCOUNTER — Ambulatory Visit: Payer: Medicare Other

## 2022-11-17 DIAGNOSIS — Z1211 Encounter for screening for malignant neoplasm of colon: Secondary | ICD-10-CM | POA: Diagnosis present

## 2022-11-25 ENCOUNTER — Ambulatory Visit: Payer: Medicare Other | Admitting: Neurosurgery

## 2022-11-25 ENCOUNTER — Encounter: Payer: Self-pay | Admitting: Neurosurgery

## 2022-11-25 VITALS — BP 148/104 | HR 98 | Ht 72.0 in | Wt 150.0 lb

## 2022-11-25 DIAGNOSIS — Z981 Arthrodesis status: Secondary | ICD-10-CM

## 2022-11-25 DIAGNOSIS — M5416 Radiculopathy, lumbar region: Secondary | ICD-10-CM | POA: Diagnosis not present

## 2022-11-25 NOTE — Progress Notes (Signed)
   DOS: 11/18/21 (L4-5 XLIF/PSF)  HISTORY OF PRESENT ILLNESS: 11/25/2022 [name] is status post the above surgery.  He is having some pain down his leg intermittently.  Overall, he is still better than it was prior to surgery.    PHYSICAL EXAMINATION:   Vitals:   11/25/22 1424 11/25/22 1428  BP: (!) 142/103 (!) 148/104  Pulse: (!) 103 98   General: Patient is well developed, well nourished, calm, collected, and in no apparent distress.  NEUROLOGICAL:  General: In no acute distress.  Awake, alert, oriented to person, place, and time. Pupils equal round and reactive to light.   Strength:  Side Iliopsoas Quads Hamstring PF DF EHL  R L Incision c/d/i   ROS (Neurologic): Negative except as noted above  IMAGING: No complications noted on November 2023 x-rays  ASSESSMENT/PLAN:  Justin Nixon is doing fair after lumbar spine surgery.  He has some intermittent pains down his leg.  He is very concerned about these symptoms.  He has done physical therapy since his surgery without significant improvement.  At this point, I would like to obtain an MRI scan to determine whether there is any significant adjacent segment disease.  I have asked him to discontinue smoking.    I spent a total of 15 minutes in face-to-face and non-face-to-face activities related to this patient's care today.   Venetia Night MD, [facility] Department of Neurosurgery

## 2022-12-02 ENCOUNTER — Ambulatory Visit
Admission: RE | Admit: 2022-12-02 | Discharge: 2022-12-02 | Disposition: A | Discharge status: Home / Self Care | Payer: Medicare Other | Source: Ambulatory Visit | Attending: Neurosurgery | Admitting: Neurosurgery

## 2022-12-02 DIAGNOSIS — Z981 Arthrodesis status: Secondary | ICD-10-CM | POA: Diagnosis present

## 2022-12-02 DIAGNOSIS — M5416 Radiculopathy, lumbar region: Secondary | ICD-10-CM | POA: Insufficient documentation

## 2022-12-09 DIAGNOSIS — M5416 Radiculopathy, lumbar region: Secondary | ICD-10-CM

## 2022-12-22 ENCOUNTER — Other Ambulatory Visit: Payer: Self-pay | Admitting: Neurosurgery

## 2023-01-05 ENCOUNTER — Ambulatory Visit
Payer: Medicare Other | Attending: Student in an Organized Health Care Education/Training Program | Admitting: Student in an Organized Health Care Education/Training Program

## 2023-01-05 ENCOUNTER — Ambulatory Visit
Admission: RE | Admit: 2023-01-05 | Discharge: 2023-01-05 | Disposition: A | Discharge status: Home / Self Care | Payer: Medicare Other | Source: Ambulatory Visit | Attending: Student in an Organized Health Care Education/Training Program | Admitting: Student in an Organized Health Care Education/Training Program

## 2023-01-05 ENCOUNTER — Encounter: Payer: Self-pay | Admitting: Student in an Organized Health Care Education/Training Program

## 2023-01-05 VITALS — BP 138/113 | HR 105 | Temp 97.9°F | Resp 25 | Ht 72.0 in | Wt 154.0 lb

## 2023-01-05 DIAGNOSIS — M5416 Radiculopathy, lumbar region: Secondary | ICD-10-CM | POA: Diagnosis present

## 2023-01-05 MED ORDER — SODIUM CHLORIDE (PF) 0.9 % IJ SOLN
INTRAMUSCULAR | Status: AC
  Filled 2023-01-05: qty 10

## 2023-01-05 MED ORDER — DEXAMETHASONE SODIUM PHOSPHATE 10 MG/ML IJ SOLN
INTRAMUSCULAR | Status: AC
  Filled 2023-01-05: qty 1

## 2023-01-05 MED ORDER — ROPIVACAINE HCL 2 MG/ML IJ SOLN
INTRAMUSCULAR | Status: AC
  Filled 2023-01-05: qty 20

## 2023-01-05 MED ORDER — ROPIVACAINE HCL 2 MG/ML IJ SOLN
1.0000 mL | Freq: Once | INTRAMUSCULAR | Status: AC
  Administered 2023-01-05: 1 mL via EPIDURAL

## 2023-01-05 MED ORDER — SODIUM CHLORIDE 0.9% FLUSH
1.0000 mL | Freq: Once | INTRAVENOUS | Status: AC
  Administered 2023-01-05: 1 mL

## 2023-01-05 MED ORDER — IOHEXOL 180 MG/ML  SOLN
INTRAMUSCULAR | Status: AC
  Filled 2023-01-05: qty 20

## 2023-01-05 MED ORDER — LIDOCAINE HCL 2 % IJ SOLN
20.0000 mL | Freq: Once | INTRAMUSCULAR | Status: AC
  Administered 2023-01-05: 200 mg

## 2023-01-05 MED ORDER — IOHEXOL 180 MG/ML  SOLN
10.0000 mL | Freq: Once | INTRAMUSCULAR | Status: AC
  Administered 2023-01-05: 10 mL via EPIDURAL

## 2023-01-05 MED ORDER — LIDOCAINE HCL (PF) 2 % IJ SOLN
INTRAMUSCULAR | Status: AC
  Filled 2023-01-05: qty 10

## 2023-01-05 MED ORDER — DEXAMETHASONE SODIUM PHOSPHATE 10 MG/ML IJ SOLN
10.0000 mg | Freq: Once | INTRAMUSCULAR | Status: AC
  Administered 2023-01-05: 10 mg

## 2023-01-05 NOTE — Progress Notes (Signed)
1400 Patient's elevated BPs reported to Dr. Cherylann Ratel.

## 2023-01-05 NOTE — Patient Instructions (Signed)

## 2023-01-05 NOTE — Progress Notes (Signed)
PROVIDER NOTE: Information contained herein reflects review and annotations entered in association with encounter. Interpretation of such information and data should be left to medically-trained personnel. Information provided to patient can be located elsewhere in the medical record under "Patient Instructions". Document created using STT-dictation technology, any transcriptional errors that may result from process are unintentional.    Patient: Justin Nixon  Service: Procedure (FAST-TRACK REFERRAL)  Provider: Edward Jolly, MD  DOB: 1976-04-15  DOS: 01/05/2023  Specialty: Interventional Pain Management  MRN: [id number]  Setting: Ambulatory outpatient  Referring Provider: Venetia Night, MD  Type: New Patient  Location: Pain clinic facility  office  PCP: Gracelyn Nurse, MD  NOTE: FAST-TRACK referrals are offered to accelerate the care of patients in the need of specific interventional pain management therapies. This type of referral does not include non-interventional pain management options.   Procedure  Primary Reason for Visit: Interventional Pain Management Treatment. CC: Back Pain  HPI  Justin Nixon is a 47 y.o. year old, male patient, who comes today for a  "Fast-Track" new patient evaluation, as requested by Venetia Night, MD. The patient has been made aware that this type of referral option is reserved for the Interventional Pain Management portion of our practice and completely excludes the option of medication management. His primarily concern today is the Back Pain  Pain Assessment: Location: Right Back Radiating: radiates down both legs to feet  in the side Onset: More than a month ago Duration: Chronic pain Quality: Stabbing, Pressure Severity: 7 /10 (subjective, self-reported pain score)  Effect on ADL: Limits activities Timing: Constant Modifying factors: lying down BP: (!) 154/109  HR: (!) 105  Onset and Duration: Gradual and Present less than 3 months Cause of  pain: Surgery Severity: Getting worse, NAS-11 at its worse: 10/10, NAS-11 at its best: 4/10, NAS-11 now: 7/10, and NAS-11 on the average: 10/10 Timing: Not influenced by the time of the day Aggravating Factors: Bending, Twisting, Walking, Walking uphill, and Walking downhill Alleviating Factors: Lying down and Resting Associated Problems: Day-time cramps, Night-time cramps, Numbness, Tingling, Pain that wakes patient up, and Pain that does not allow patient to sleep Quality of Pain: Pressure-like and Sharp Previous Examinations or Tests: MRI scan   The patient comes into the clinics today, referred to [place] for a left lumbar epidural steroid injection.  Justin Nixon is a pleasant 47 year old male with a history of L4-L5 lumbar interbody fusion on 11/18/2021.  He is complaining of left leg pain that radiates down to his left foot.  He has done physical therapy since his surgery without any improvement.  His most recent MRI shows a circumferential disc bulge and associated left extraforaminal disc extrusion impacting the left L3 nerve root.  He is being referred here from Dr. Marcell Barlow for a left lumbar epidural steroid injection.  Meds   Current Outpatient Medications:    gabapentin (NEURONTIN) 300 MG capsule, TAKE 1 CAPSULE(300 MG) BY MOUTH AT BEDTIME, Disp: 30 capsule, Rfl: 1   lurasidone (LATUDA) 40 MG TABS tablet, Take 40 mg by mouth daily after supper., Disp: , Rfl:    ondansetron (ZOFRAN-ODT) 8 MG disintegrating tablet, Take 1 tablet (8 mg total) by mouth every 8 (eight) hours as needed for nausea or vomiting., Disp: 20 tablet, Rfl: 0   QUEtiapine (SEROQUEL) 100 MG tablet, Take 50-100 mg by mouth at bedtime., Disp: , Rfl:   Imaging Review    MR SHOULDER RIGHT WO CONTRAST  Narrative CLINICAL DATA:  Shoulder pain and inability to  raise right arm after dislocating shoulder last night. No previous relevant surgery.  EXAM: MRI OF THE RIGHT SHOULDER WITHOUT CONTRAST  TECHNIQUE: Multiplanar,  multisequence MR imaging of the shoulder was performed. No intravenous contrast was administered.  COMPARISON:  None.  FINDINGS: Rotator cuff: There is extensive edema within and surrounding the infraspinatus muscle and tendon. The tendon is diffusely attenuated and at least partially torn. There may be some intact fibers on the sagittal images, although some tendon retraction is suggested on the coronal images. The supraspinatus, subscapularis and teres minor tendons are intact.  Muscles: As above, there is extensive edema/ hemorrhage within and surrounding the infraspinatus muscle.  Biceps long head:  Intact and normally positioned.  Acromioclavicular Joint: The acromion is type 2. There are mild acromioclavicular degenerative changes. A small amount of fluid is present posteriorly in the subacromial-subdeltoid bursa.  Glenohumeral Joint: The humeral head is located. There is no significant glenohumeral joint effusion. Small focus of susceptibility artifact interposed between the humeral head and the glenoid on coronal image number 11 could reflect vacuum phenomenon.Small loose body or labral fragment difficult to exclude.  Labrum: Labral evaluation limited by the lack joint fluid. No definite labral abnormality seen.  Bones: The humeral head is located. There is no evidence of Hill-Sachs deformity or osseous Bankart lesion.  Other: No significant soft tissue findings.  IMPRESSION: 1. Significant infraspinatus injury with extensive muscular edema. There is least high-grade partial tearing of the infraspinatus tendon with some tendon retraction on the coronal images. 2. The humeral head is located. No Hill-Sachs or osseous Bankart lesions identified. 3. Labral assessment is limited by the lack joint fluid. MR arthrography should be considered given the reported dislocation.   Electronically Signed By: Carey Bullocks M.D. On: 07/04/2016 15:27   MR SHOULDER LEFT WO  CONTRAST  Narrative CLINICAL DATA:  Left shoulder pain for 1 year.  No known injury.  EXAM: MRI OF THE LEFT SHOULDER WITHOUT CONTRAST  TECHNIQUE: Multiplanar, multisequence MR imaging of the shoulder was performed. No intravenous contrast was administered.  COMPARISON:  None.  FINDINGS: Rotator cuff: Intact. Mildly increased T2 signal in the supraspinatus tendon consistent with tendinopathy noted.  Muscles:  Normal without atrophy or focal lesion.  Biceps long head:  Intact and normal in appearance.  Acromioclavicular Joint: Degenerated with fairly marked marrow edema about the joint. Type II acromion. No subacromial/subdeltoid bursal fluid.  Glenohumeral Joint: Negative.  Labrum:  Intact.  Bones:  No fracture or worrisome lesion.  Other: None.  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  Mild supraspinatus tendinopathy without tear.   Electronically Signed By: Drusilla Kanner M.D. On: 03/06/2017 14:52  MR LUMBAR SPINE WO CONTRAST  Narrative CLINICAL DATA:  Low back pain, symptoms persist with > 6 wks treatment. Left leg pain/burning. Frequent falls. Prior lumbar surgery.  EXAM: MRI LUMBAR SPINE WITHOUT CONTRAST  TECHNIQUE: Multiplanar, multisequence MR imaging of the lumbar spine was performed. No intravenous contrast was administered.  COMPARISON:  Lumbar spine MRI 08/10/2021  FINDINGS: Segmentation:  5 lumbar vertebrae.  Rudimentary disc at S1-2.  Alignment: Mild lumbar levoscoliosis. Unchanged minimal retrolisthesis of L1 on L2, L2 on L3, and L3 on L4.  Vertebrae: No fracture or suspicious marrow lesion. Increased, mild degenerative endplate edema at Z6-1. Interval posterior and interbody fusion at L4-5.  Conus medullaris and cauda equina: Conus extends to the L2 level. Conus and cauda equina appear normal.  Paraspinal and other soft tissues:  Unremarkable.  Disc  levels:  Disc desiccation and mild disc space narrowing from L1-2 through L3-4.  T12-L1: Only imaged sagittally and negative.  L1-2: Disc bulging with central annular fissure, unchanged. No stenosis.  L2-3: Increased disc bulging and a new moderate-sized right subarticular disc extrusion with mild superior migration result in moderate right and mild left lateral recess stenosis with potential right L3 nerve root impingement. No significant generalized spinal stenosis or neural foraminal stenosis.  L3-4: Circumferential disc bulging, endplate spurring, and mild facet and ligamentum flavum hypertrophy result in mild bilateral lateral recess stenosis and mild-to-moderate right and moderate left neural foraminal stenosis, similar to the prior study. A left extraforaminal disc extrusion that appears either new or larger than on the prior study and may result in left L3 nerve impingement. No significant generalized spinal stenosis.  L4-5: Interval fusion.  No stenosis.  L5-S1: Minimal disc bulging and mild facet hypertrophy without stenosis, unchanged.  IMPRESSION: 1. New or larger left extraforaminal disc extrusion at L3-4 with potential left L3 nerve impingement. 2. New right subarticular disc extrusion at L2-3 with potential right L3 nerve root impingement. 3. Interval L4-5 fusion without stenosis.   Electronically Signed By: Sebastian Ache M.D. On: 12/05/2022 16:48   Narrative CLINICAL DATA:  Pain across the lower back. Patient had lumbar fusion November 18, 2021  EXAM: LUMBAR SPINE - 2-3 VIEW  COMPARISON:  November 18, 2021  FINDINGS: There is no evidence of lumbar spine fracture. Mild scoliosis of spine identified. Patient is status post posterior fusion of L4-5 with intervertebral spacer noted without malalignment. Mild anterior spurring is identified in the lumbar spine.  IMPRESSION: Status post posterior fusion of L4-5 with intervertebral spacer noted without  malalignment.   Electronically Signed By: Sherian Rein M.D. On: 03/13/2022 14:28   DG Elbow Complete Left  Narrative CLINICAL DATA:  Fall with elbow trauma.  EXAM: LEFT ELBOW - COMPLETE 3+ VIEW  COMPARISON:  None.  FINDINGS: There is dorsal skin laceration and swelling at the level of the olecranon, positive anterior and posterior supracondylar fat pad signs, and an acute transverse oblique fracture of the dorsal base of the olecranon process of the ulna.  There is no further evidence of fractures. Arthritic changes are not seen.  There is normal bone mineralization. No radiopaque foreign body is evident.  IMPRESSION: Transverse oblique intra-articular nondisplaced fracture of the dorsal base of the proximal ulnar olecranon process, with joint effusion and dorsal overlying laceration.   Electronically Signed By: Almira Bar M.D. On: 08/28/2021 02:02   Complexity Note: Imaging results reviewed.                         ROS  Cardiovascular: No reported cardiovascular signs or symptoms such as High blood pressure, coronary artery disease, abnormal heart rate or rhythm, heart attack, blood thinner therapy or heart weakness and/or failure Pulmonary or Respiratory: No reported pulmonary signs or symptoms such as wheezing and difficulty taking a deep full breath (Asthma), difficulty blowing air out (Emphysema), coughing up mucus (Bronchitis), persistent dry cough, or temporary stoppage of breathing during sleep Neurological: No reported neurological signs or symptoms such as seizures, abnormal skin sensations, urinary and/or fecal incontinence, being born with an abnormal open spine and/or a tethered spinal cord Psychological-Psychiatric: Psychiatric disorder Gastrointestinal: No reported gastrointestinal signs or symptoms such as vomiting or evacuating blood, reflux, heartburn, alternating episodes of diarrhea and constipation, inflamed or scarred liver, or pancreas or  irrregular  and/or infrequent bowel movements Genitourinary: No reported renal or genitourinary signs or symptoms such as difficulty voiding or producing urine, peeing blood, non-functioning kidney, kidney stones, difficulty emptying the bladder, difficulty controlling the flow of urine, or chronic kidney disease Hematological: No reported hematological signs or symptoms such as prolonged bleeding, low or poor functioning platelets, bruising or bleeding easily, hereditary bleeding problems, low energy levels due to low hemoglobin or being anemic Endocrine: No reported endocrine signs or symptoms such as high or low blood sugar, rapid heart rate due to high thyroid levels, obesity or weight gain due to slow thyroid or thyroid disease Rheumatologic: No reported rheumatological signs and symptoms such as fatigue, joint pain, tenderness, swelling, redness, heat, stiffness, decreased range of motion, with or without associated rash Musculoskeletal: Negative for myasthenia gravis, muscular dystrophy, multiple sclerosis or malignant hyperthermia Work History: Disabled  Allergies  Justin Nixon is allergic to penicillins.  Laboratory Chemistry Profile   Renal Lab Results  Component Value Date   BUN 10 11/08/2021   CREATININE 1.00 11/18/2021   GFRNONAA >60 11/18/2021   PROTEINUR 30 (A) 11/08/2021     Electrolytes Lab Results  Component Value Date   NA 139 11/08/2021   K 4.0 11/08/2021   CL 103 11/08/2021   CALCIUM 9.1 11/08/2021     Hepatic No results found for: "AST", "ALT", "ALBUMIN", "ALKPHOS", "AMYLASE", "LIPASE", "AMMONIA"   ID Lab Results  Component Value Date   HIV Non Reactive 08/28/2021   SARSCOV2NAA NEGATIVE 08/28/2021   STAPHAUREUS NEGATIVE 11/18/2021   MRSAPCR NEGATIVE 11/18/2021     Bone No results found for: "VD25OH", "VD125OH2TOT", "ZO1096EA5", "WU9811BJ4", "25OHVITD1", "25OHVITD2", "25OHVITD3", "TESTOFREE", "TESTOSTERONE"   Endocrine Lab Results  Component Value Date    GLUCOSE 110 (H) 11/08/2021   GLUCOSEU NEGATIVE 11/08/2021     Neuropathy Lab Results  Component Value Date   HIV Non Reactive 08/28/2021     CNS No results found for: "COLORCSF", "APPEARCSF", "RBCCOUNTCSF", "WBCCSF", "POLYSCSF", "LYMPHSCSF", "EOSCSF", "PROTEINCSF", "GLUCCSF", "JCVIRUS", "CSFOLI", "IGGCSF", "LABACHR", "ACETBL"   Inflammation (CRP: Acute  ESR: Chronic) No results found for: "CRP", "ESRSEDRATE", "LATICACIDVEN"   Rheumatology No results found for: "RF", "ANA", "LABURIC", "URICUR", "LYMEIGGIGMAB", "LYMEABIGMQN", "HLAB27"   Coagulation Lab Results  Component Value Date   INR 0.9 08/28/2021   LABPROT 12.3 08/28/2021   PLT 265 11/18/2021     Cardiovascular Lab Results  Component Value Date   HGB 13.1 11/18/2021   HCT 39.1 11/18/2021     Screening Lab Results  Component Value Date   SARSCOV2NAA NEGATIVE 08/28/2021   STAPHAUREUS NEGATIVE 11/18/2021   MRSAPCR NEGATIVE 11/18/2021   HIV Non Reactive 08/28/2021     Cancer No results found for: "CEA", "CA125", "LABCA2"   Allergens No results found for: "ALMOND", "APPLE", "ASPARAGUS", "AVOCADO", "BANANA", "BARLEY", "BASIL", "BAYLEAF", "GREENBEAN", "LIMABEAN", "WHITEBEAN", "BEEFIGE", "REDBEET", "BLUEBERRY", "BROCCOLI", "CABBAGE", "MELON", "CARROT", "CASEIN", "CASHEWNUT", "CAULIFLOWER", "CELERY"     Note: Lab results reviewed.  PFSH  Drug: Justin Nixon  reports no history of drug use. Alcohol:  reports current alcohol use. Tobacco:  reports that he has been smoking cigarettes. He has a 11.00 pack-year smoking history. He has never used smokeless tobacco. Medical:  has a past medical history of Anxiety, Arthritis, GERD (gastroesophageal reflux disease), Hyperlipidemia, and Schizophrenia (HCC). Family: family history is not on file.  Past Surgical History:  Procedure Laterality Date   ANTERIOR LATERAL LUMBAR FUSION WITH PERCUTANEOUS SCREW 1 LEVEL N/A 11/18/2021   Procedure: L4-5 LATERAL LUMBAR INTERBODY FUSION  WITH POSTERIOR SPINAL  FUSION;  Surgeon: Venetia Night, MD;  Location: [facility];  Service: Neurosurgery;  Laterality: N/A;   APPLICATION OF INTRAOPERATIVE CT SCAN N/A 11/18/2021   Procedure: APPLICATION OF INTRAOPERATIVE CT SCAN;  Surgeon: Venetia Night, MD;  Location: [facility];  Service: Neurosurgery;  Laterality: N/A;   COLONOSCOPY     CYST EXCISION     ELBOW ARTHROSCOPY Left 10/10/2020   Procedure: LEFT ELBOW ARTHROSCOPY WITH DEBRIDEMENT AND EXCISION OF LOOSE BODIES;  Surgeon: Christena Flake, MD;  Location: [facility];  Service: Orthopedics;  Laterality: Left;   MYRINGOTOMY WITH TUBE PLACEMENT     ORIF ELBOW FRACTURE Left 08/28/2021   Procedure: OPEN REDUCTION INTERNAL FIXATION (ORIF) ELBOW/OLECRANON FRACTURE;  Surgeon: Christena Flake, MD;  Location: [facility];  Service: Orthopedics;  Laterality: Left;   SHOULDER ARTHROSCOPY Left 03/18/2017   Procedure: ARTHROSCOPY SHOULDER with debridement decompression and distal clavicle excision;  Surgeon: Christena Flake, MD;  Location: [facility];  Service: Orthopedics;  Laterality: Left;   SHOULDER ARTHROSCOPY WITH OPEN ROTATOR CUFF REPAIR Right 09/23/2016   Procedure: SHOULDER ARTHROSCOPY WITH OPEN ROTATOR CUFF REPAIR;  Surgeon: Christena Flake, MD;  Location: [facility];  Service: Orthopedics;  Laterality: Right;   SHOULDER ARTHROSCOPY WITH SUBACROMIAL DECOMPRESSION Right 09/23/2016   Procedure: SHOULDER ARTHROSCOPY WITH SUBACROMIAL DECOMPRESSION AND DEBRIDEMENT;  Surgeon: Christena Flake, MD;  Location: [facility];  Service: Orthopedics;  Laterality: Right;   Active Ambulatory Problems    Diagnosis Date Noted   Olecranon fracture, left, open type I or II, initial encounter 08/28/2021   S/P spinal fusion 11/18/2021   Resolved Ambulatory Problems    Diagnosis Date Noted   No Resolved Ambulatory Problems   Past Medical History:  Diagnosis Date   Anxiety    Arthritis    GERD (gastroesophageal reflux disease)    Hyperlipidemia    Schizophrenia (HCC)     Constitutional Exam  General appearance: Well nourished, well developed, and well hydrated. In no apparent acute distress Vitals:   01/05/23 1358 01/05/23 1400  BP: (!) 158/106 (!) 154/109  Pulse: (!) 105   Resp: 16   Temp: 97.9 F (36.6 C)   SpO2: 100%   Weight: 154 lb (69.9 kg)   Height: 6' (1.829 m)    BMI Assessment: Estimated body mass index is 20.89 kg/m as calculated from the following:   Height as of this encounter: 6' (1.829 m).   Weight as of this encounter: 154 lb (69.9 kg).  BMI interpretation table: BMI level Category Range association with higher incidence of chronic pain  <18 kg/m2 Underweight   18.5-24.9 kg/m2 Ideal body weight   25-29.9 kg/m2 Overweight Increased incidence by 20%  30-34.9 kg/m2 Obese (Class I) Increased incidence by 68%  35-39.9 kg/m2 Severe obesity (Class II) Increased incidence by 136%  >40 kg/m2 Extreme obesity (Class III) Increased incidence by 254%   Patient's current BMI Ideal Body weight  Body mass index is 20.89 kg/m. Ideal body weight: 77.6 kg (171 lb 1.2 oz)   BMI Readings from Last 4 Encounters:  01/05/23 20.89 kg/m  11/25/22 20.34 kg/m  06/17/22 20.43 kg/m  05/19/22 20.07 kg/m   Wt Readings from Last 4 Encounters:  01/05/23 154 lb (69.9 kg)  11/25/22 150 lb (68 kg)  06/17/22 150 lb 9.6 oz (68.3 kg)  05/19/22 148 lb (67.1 kg)    Psych/Mental status: Alert, oriented x 3 (person, place, & time)       Eyes: PERLA Respiratory: No evidence of acute respiratory  distress  Lumbar Exam  Skin & Axial Inspection: Well healed scar from previous spine surgery detected Alignment: Symmetrical Functional ROM: Pain restricted ROM affecting primarily the left Stability: No instability detected Muscle Tone/Strength: Functionally intact. No obvious neuro-muscular anomalies detected. Sensory (Neurological): Dermatomal pain pattern  Provocative Tests: Hyperextension/rotation test: deferred today       Lumbar quadrant test  (Kemp's test): deferred today       Lateral bending test: (+) ipsilateral radicular pain, on the left. Positive for left-sided foraminal stenosis.   Gait & Posture Assessment  Ambulation: Unassisted Gait: Limited. Using assistive device to ambulate Posture: WNL   Lower Extremity Exam    Side: Right lower extremity  Side: Left lower extremity  Stability: No instability observed          Stability: No instability observed          Skin & Extremity Inspection: Skin color, temperature, and hair growth are WNL. No peripheral edema or cyanosis. No masses, redness, swelling, asymmetry, or associated skin lesions. No contractures.  Skin & Extremity Inspection: Skin color, temperature, and hair growth are WNL. No peripheral edema or cyanosis. No masses, redness, swelling, asymmetry, or associated skin lesions. No contractures.  Functional ROM: Unrestricted ROM                  Functional ROM: Pain restricted ROM for hip and knee joints          Muscle Tone/Strength: Functionally intact. No obvious neuro-muscular anomalies detected.  Muscle Tone/Strength: Functionally intact. No obvious neuro-muscular anomalies detected.  Sensory (Neurological): Unimpaired        Sensory (Neurological): Dermatomal pain pattern        DTR: Patellar: deferred today Achilles: deferred today Plantar: deferred today  DTR: Patellar: deferred today Achilles: deferred today Plantar: deferred today  Palpation: No palpable anomalies  Palpation: No palpable anomalies   Procedure: Left L3 TF ESI- see attached procedure note.

## 2023-01-05 NOTE — Progress Notes (Signed)
PROVIDER NOTE: Interpretation of information contained herein should be left to medically-trained personnel. Specific patient instructions are provided elsewhere under "Patient Instructions" section of medical record. This document was created in part using STT-dictation technology, any transcriptional errors that may result from this process are unintentional.  Patient: Justin Nixon Type: Established DOB: 1976-05-27 MRN: [id number] PCP: Gracelyn Nurse, MD  Service: Procedure DOS: 01/05/2023 Setting: Ambulatory Location: Ambulatory outpatient facility Delivery: Face-to-face Provider: Edward Jolly, MD Specialty: Interventional Pain Management Specialty designation: 09 Location: Outpatient facility Ref. Prov.: Venetia Night, MD       Interventional Therapy   Procedure: Lumbar trans-foraminal epidural steroid injection (L-TFESI) #1  Laterality: Left (-LT)  Level: L3 nerve root(s) Imaging: Fluoroscopy-guided         Anesthesia: Local anesthesia (1-2% Lidocaine) DOS: 01/05/2023  Performed by: Edward Jolly, MD  Purpose: Diagnostic/Therapeutic Indications: Lumbar radicular pain severe enough to impact quality of life or function. 1. Lumbar radiculopathy    NAS-11 Pain score:   Pre-procedure: 7 /10   Post-procedure: 0-No pain/10     Position / Prep / Materials:  Position: Prone  Prep solution: DuraPrep (Iodine Povacrylex [0.7% available iodine] and Isopropyl Alcohol, 74% w/w) Prep Area: Entire Posterior Lumbosacral Area.  From the lower tip of the scapula down to the tailbone and from flank to flank. Materials:  Tray: Block Needle(s):  Type: Spinal  Gauge (G): 22  Length: 3.5-in  Qty: 1      Pre-op H&P Assessment:  Mr. Wo is a 47 y.o. (year old), male patient, seen today for interventional treatment. He  has a past surgical history that includes Cyst excision; Myringotomy with tube placement; Shoulder arthroscopy with open rotator cuff repair (Right, 09/23/2016);  Shoulder arthroscopy with subacromial decompression (Right, 09/23/2016); Shoulder arthroscopy (Left, 03/18/2017); Colonoscopy; Elbow arthroscopy (Left, 10/10/2020); ORIF elbow fracture (Left, 08/28/2021); Anterior lateral lumbar fusion with percutaneous screw 1 level (N/A, 11/18/2021); and Application of intraoperative CT scan (N/A, 11/18/2021). Mr. Banducci has a current medication list which includes the following prescription(s): gabapentin, lurasidone, ondansetron, and quetiapine. His primarily concern today is the Back Pain  Initial Vital Signs:  Pulse/HCG Rate: (!) 105ECG Heart Rate: 92 Temp: 97.9 F (36.6 C) Resp: 16 BP: (!) 158/106 SpO2: 100 %  BMI: Estimated body mass index is 20.89 kg/m as calculated from the following:   Height as of this encounter: 6' (1.829 m).   Weight as of this encounter: 154 lb (69.9 kg).  Risk Assessment: Allergies: Reviewed. He is allergic to penicillins.  Allergy Precautions: None required Coagulopathies: Reviewed. None identified.  Blood-thinner therapy: None at this time Active Infection(s): Reviewed. None identified. Mr. Confer is afebrile  Site Confirmation: Mr. Vanamburg was asked to confirm the procedure and laterality before marking the site Procedure checklist: Completed Consent: Before the procedure and under the influence of no sedative(s), amnesic(s), or anxiolytics, the patient was informed of the treatment options, risks and possible complications. To fulfill our ethical and legal obligations, as recommended by the American Medical Association's Code of Ethics, I have informed the patient of my clinical impression; the nature and purpose of the treatment or procedure; the risks, benefits, and possible complications of the intervention; the alternatives, including doing nothing; the risk(s) and benefit(s) of the alternative treatment(s) or procedure(s); and the risk(s) and benefit(s) of doing nothing. The patient was provided information about the general  risks and possible complications associated with the procedure. These may include, but are not limited to: failure to achieve desired goals, infection, bleeding,  organ or nerve damage, allergic reactions, paralysis, and death. In addition, the patient was informed of those risks and complications associated to Spine-related procedures, such as failure to decrease pain; infection (i.e.: Meningitis, epidural or intraspinal abscess); bleeding (i.e.: epidural hematoma, subarachnoid hemorrhage, or any other type of intraspinal or peri-dural bleeding); organ or nerve damage (i.e.: Any type of peripheral nerve, nerve root, or spinal cord injury) with subsequent damage to sensory, motor, and/or autonomic systems, resulting in permanent pain, numbness, and/or weakness of one or several areas of the body; allergic reactions; (i.e.: anaphylactic reaction); and/or death. Furthermore, the patient was informed of those risks and complications associated with the medications. These include, but are not limited to: allergic reactions (i.e.: anaphylactic or anaphylactoid reaction(s)); adrenal axis suppression; blood sugar elevation that in diabetics may result in ketoacidosis or comma; water retention that in patients with history of congestive heart failure may result in shortness of breath, pulmonary edema, and decompensation with resultant heart failure; weight gain; swelling or edema; medication-induced neural toxicity; particulate matter embolism and blood vessel occlusion with resultant organ, and/or nervous system infarction; and/or aseptic necrosis of one or more joints. Finally, the patient was informed that Medicine is not an exact science; therefore, there is also the possibility of unforeseen or unpredictable risks and/or possible complications that may result in a catastrophic outcome. The patient indicated having understood very clearly. We have given the patient no guarantees and we have made no promises. Enough  time was given to the patient to ask questions, all of which were answered to the patient's satisfaction. Mr. Bonaventura has indicated that he wanted to continue with the procedure. Attestation: I, the ordering provider, attest that I have discussed with the patient the benefits, risks, side-effects, alternatives, likelihood of achieving goals, and potential problems during recovery for the procedure that I have provided informed consent. Date  Time: 01/05/2023  1:56 PM   Pre-Procedure Preparation:  Monitoring: As per clinic protocol. Respiration, ETCO2, SpO2, BP, heart rate and rhythm monitor placed and checked for adequate function Safety Precautions: Patient was assessed for positional comfort and pressure points before starting the procedure. Time-out: I initiated and conducted the "Time-out" before starting the procedure, as per protocol. The patient was asked to participate by confirming the accuracy of the "Time Out" information. Verification of the correct person, site, and procedure were performed and confirmed by me, the nursing staff, and the patient. "Time-out" conducted as per Joint Commission's Universal Protocol (UP.01.01.01). Time: 1437 Start Time: 1437 hrs.  Description/Narrative of Procedure:          Target: The 6 o'clock position under the pedicle, on the affected side. Region: Posterolateral Lumbosacral Approach: Posterior Percutaneous Paravertebral approach.  Rationale (medical necessity): procedure needed and proper for the diagnosis and/or treatment of the patient's medical symptoms and needs. Procedural Technique Safety Precautions: Aspiration looking for blood return was conducted prior to all injections. At no point did we inject any substances, as a needle was being advanced. No attempts were made at seeking any paresthesias. Safe injection practices and needle disposal techniques used. Medications properly checked for expiration dates. SDV (single dose vial) medications  used. Description of the Procedure: Protocol guidelines were followed. The patient was placed in position over the procedure table. The target area was identified and the area prepped in the usual manner. Skin & deeper tissues infiltrated with local anesthetic. Appropriate amount of time allowed to pass for local anesthetics to take effect. The procedure needles were then advanced to the  target area. Proper needle placement secured. Negative aspiration confirmed. Solution injected in intermittent fashion, asking for systemic symptoms every 0.5cc of injectate. The needles were then removed and the area cleansed, making sure to leave some of the prepping solution back to take advantage of its long term bactericidal properties.  Vitals:   01/05/23 1400 01/05/23 1428 01/05/23 1434 01/05/23 1441  BP: (!) 154/109 (!) 164/106 (!) 139/97 (!) 138/113  Pulse:      Resp:  (!) 29 20 (!) 25  Temp:      SpO2:  99% 99% 97%  Weight:      Height:        Start Time: 1437 hrs. End Time: 1440 hrs.  Imaging Guidance (Spinal):          Type of Imaging Technique: Fluoroscopy Guidance (Spinal) Indication(s): Assistance in needle guidance and placement for procedures requiring needle placement in or near specific anatomical locations not easily accessible without such assistance. Exposure Time: Please see nurses notes. Contrast: Before injecting any contrast, we confirmed that the patient did not have an allergy to iodine, shellfish, or radiological contrast. Once satisfactory needle placement was completed at the desired level, radiological contrast was injected. Contrast injected under live fluoroscopy. No contrast complications. See chart for type and volume of contrast used. Fluoroscopic Guidance: I was personally present during the use of fluoroscopy. "Tunnel Vision Technique" used to obtain the best possible view of the target area. Parallax error corrected before commencing the procedure.  "Direction-depth-direction" technique used to introduce the needle under continuous pulsed fluoroscopy. Once target was reached, antero-posterior, oblique, and lateral fluoroscopic projection used confirm needle placement in all planes. Images permanently stored in EMR. Interpretation: I personally interpreted the imaging intraoperatively. Adequate needle placement confirmed in multiple planes. Appropriate spread of contrast into desired area was observed. No evidence of afferent or efferent intravascular uptake. No intrathecal or subarachnoid spread observed. Permanent images saved into the patient's record.  Post-operative Assessment:  Post-procedure Vital Signs:  Pulse/HCG Rate: (!) 10592 Temp: 97.9 F (36.6 C) Resp: (!) 25 BP: (!) 138/113 SpO2: 97 %  EBL: None  Complications: No immediate post-treatment complications observed by team, or reported by patient.  Note: The patient tolerated the entire procedure well. A repeat set of vitals were taken after the procedure and the patient was kept under observation following institutional policy, for this type of procedure. Post-procedural neurological assessment was performed, showing return to baseline, prior to discharge. The patient was provided with post-procedure discharge instructions, including a section on how to identify potential problems. Should any problems arise concerning this procedure, the patient was given instructions to immediately contact us, at any time, without hesitation. In any case, we plan to contact the patient by telephone for a follow-up status report regarding this interventional procedure.  Comments:  No additional relevant information.  Plan of Care (POC)  Orders:  Orders Placed This Encounter  Procedures   DG PAIN CLINIC C-ARM 1-60 MIN NO REPORT    Intraoperative interpretation by procedural physician at [facility].    Standing Status:   Standing    Number of Occurrences:   1    Order Specific  Question:   Reason for exam:    Answer:   Assistance in needle guidance and placement for procedures requiring needle placement in or near specific anatomical locations not easily accessible without such assistance.     Medications ordered for procedure: Meds ordered this encounter  Medications   iohexol (OMNIPAQUE) 180 MG/ML  injection 10 mL    Must be Myelogram-compatible. If not available, you may substitute with a water-soluble, non-ionic, hypoallergenic, myelogram-compatible radiological contrast medium.   lidocaine (XYLOCAINE) 2 % (with pres) injection 400 mg   sodium chloride flush (NS) 0.9 % injection 1 mL   ropivacaine (PF) 2 mg/mL (0.2%) (NAROPIN) injection 1 mL   dexamethasone (DECADRON) injection 10 mg   Medications administered: We administered iohexol, lidocaine, sodium chloride flush, ropivacaine (PF) 2 mg/mL (0.2%), and dexamethasone.  See the medical record for exact dosing, route, and time of administration.  Follow-up plan:   Return in about 4 weeks (around 02/02/2023) for Post Procedure Evaluation, in person.       Left L3 transforaminal ESI.    Recent Visits No visits were found meeting these conditions. Showing recent visits within past 90 days and meeting all other requirements Today's Visits Date Type Provider Dept  01/05/23 Procedure visit Edward Jolly, MD [facility]  Showing today's visits and meeting all other requirements Future Appointments Date Type Provider Dept  02/02/23 Appointment Edward Jolly, MD [facility]  Showing future appointments within next 90 days and meeting all other requirements  Disposition: Discharge home  Discharge (Date  Time): 01/05/2023; 1448 hrs.   Primary Care Physician: Gracelyn Nurse, MD Location: [facility] Note by: Edward Jolly, MD (TTS technology used. I apologize for any typographical errors that were not detected and corrected.) Date: 01/05/2023; Time: 2:56  PM  Disclaimer:  Medicine is not an Visual merchandiser. The only guarantee in medicine is that nothing is guaranteed. It is important to note that the decision to proceed with this intervention was based on the information collected from the patient. The Data and conclusions were drawn from the patient's questionnaire, the interview, and the physical examination. Because the information was provided in large part by the patient, it cannot be guaranteed that it has not been purposely or unconsciously manipulated. Every effort has been made to obtain as much relevant data as possible for this evaluation. It is important to note that the conclusions that lead to this procedure are derived in large part from the available data. Always take into account that the treatment will also be dependent on availability of resources and existing treatment guidelines, considered by other Pain Management Practitioners as being common knowledge and practice, at the time of the intervention. For Medico-Legal purposes, it is also important to point out that variation in procedural techniques and pharmacological choices are the acceptable norm. The indications, contraindications, technique, and results of the above procedure should only be interpreted and judged by a Board-Certified Interventional Pain Specialist with extensive familiarity and expertise in the same exact procedure and technique.

## 2023-01-05 NOTE — Progress Notes (Signed)
Safety precautions to be maintained throughout the outpatient stay will include: orient to surroundings, keep bed in low position, maintain call bell within reach at all times, provide assistance with transfer out of bed and ambulation.  

## 2023-01-27 ENCOUNTER — Encounter: Payer: Self-pay | Admitting: Neurosurgery

## 2023-01-27 ENCOUNTER — Ambulatory Visit: Payer: Medicare Other | Admitting: Neurosurgery

## 2023-01-27 VITALS — BP 123/78 | Ht 72.0 in | Wt 154.0 lb

## 2023-01-27 DIAGNOSIS — M5416 Radiculopathy, lumbar region: Secondary | ICD-10-CM

## 2023-01-27 DIAGNOSIS — M5116 Intervertebral disc disorders with radiculopathy, lumbar region: Secondary | ICD-10-CM

## 2023-01-27 NOTE — Progress Notes (Signed)
   DOS: 11/18/21 (L4-5 XLIF/PSF)  HISTORY OF PRESENT ILLNESS: 01/27/2023 He had a left-sided L3-4 transforaminal epidural steroid injection which was very helpful during the anesthetic phase.  He continues to have left anterior thigh pain.  11/25/2022 [name] is status post the above surgery.  He is having some pain down his leg intermittently.  Overall, he is still better than it was prior to surgery.    PHYSICAL EXAMINATION:   Vitals:   01/27/23 1443  BP: 123/78   General: Patient is well developed, well nourished, calm, collected, and in no apparent distress.  NEUROLOGICAL:  General: In no acute distress.  Awake, alert, oriented to person, place, and time. Pupils equal round and reactive to light.   Strength:  Side Iliopsoas Quads Hamstring PF DF EHL  R 5 5 5 5 5 5   L 5 5 5 5 5 5    Incision c/d/i   ROS (Neurologic): Negative except as noted above  IMAGING: No complications noted on November 2023 x-rays  MRI L spine 12/05/22 IMPRESSION: 1. New or larger left extraforaminal disc extrusion at L3-4 with potential left L3 nerve impingement. 2. New right subarticular disc extrusion at L2-3 with potential right L3 nerve root impingement. 3. Interval L4-5 fusion without stenosis.     Electronically Signed   By: Sebastian Ache M.D.   On: 12/05/2022 16:48   ASSESSMENT/PLAN:  Justin Nixon is doing fair after L4-5 fusion.  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  I would like him to start physical therapy.  If he does not improve with this, we will discuss L3-4 far lateral discectomy.   I spent a total of 15 minutes in face-to-face and non-face-to-face activities related to this patient's care today.   Venetia Night MD, [facility] Department of Neurosurgery

## 2023-02-02 ENCOUNTER — Ambulatory Visit: Payer: Medicare Other | Admitting: Student in an Organized Health Care Education/Training Program

## 2023-02-03 ENCOUNTER — Other Ambulatory Visit: Payer: Self-pay

## 2023-02-03 ENCOUNTER — Telehealth: Payer: Self-pay | Admitting: Neurosurgery

## 2023-02-03 ENCOUNTER — Emergency Department: Payer: Medicare Other

## 2023-02-03 ENCOUNTER — Emergency Department
Admission: EM | Admit: 2023-02-03 | Discharge: 2023-02-03 | Disposition: A | Discharge status: Home / Self Care | Payer: Medicare Other | Attending: Emergency Medicine | Admitting: Emergency Medicine

## 2023-02-03 DIAGNOSIS — R32 Unspecified urinary incontinence: Secondary | ICD-10-CM | POA: Diagnosis present

## 2023-02-03 DIAGNOSIS — M48061 Spinal stenosis, lumbar region without neurogenic claudication: Secondary | ICD-10-CM | POA: Insufficient documentation

## 2023-02-03 NOTE — ED Provider Notes (Signed)
[facility] Provider Note    Event Date/Time   First MD Initiated Contact with Patient 02/03/23 1913     (approximate)   History   Loss Control of Bladder   HPI  Justin Nixon is a 47 y.o. male past medical history significant for L4-L5 fusion with new disc herniation at L3-L4, who presents to the emergency department after being told to come in for MRI by neurosurgery Dr. Marcell Barlow.  Patient states that he is working with physical therapy and developed worsening pain to his back.  Is having difficulty with urinary incontinence and holding his urine.  States that he is having ongoing numbness and tingling sensation.  Denies any difficulties with his bowels.  Recent MRI done 12/05/2022 that showed new or larger left extraforaminal disc cyst extrusion at L3-L4 with L3 nerve impingement.  New right disc extrusion at L2-L3 with potential right L3 nerve impingement.     Physical Exam   Triage Vital Signs: ED Triage Vitals [02/03/23 1904]  Enc Vitals Group     BP (!) 141/103     Pulse Rate (!) 113     Resp 17     Temp 98.1 F (36.7 C)     Temp src      SpO2 98 %     Weight 154 lb (69.9 kg)     Height 6' (1.829 m)     Head Circumference      Peak Flow      Pain Score 10     Pain Loc      Pain Edu?      Excl. in GC?     Most recent vital signs: Vitals:   02/03/23 1904  BP: (!) 141/103  Pulse: (!) 113  Resp: 17  Temp: 98.1 F (36.7 C)  SpO2: 98%    Physical Exam Constitutional:      Appearance: He is well-developed.  HENT:     Head: Atraumatic.  Eyes:     Conjunctiva/sclera: Conjunctivae normal.  Cardiovascular:     Rate and Rhythm: Regular rhythm.  Pulmonary:     Effort: No respiratory distress.  Genitourinary:    Comments: Bladder scan was 65 mL in the bladder. Musculoskeletal:        General: Normal range of motion.     Cervical back: Normal range of motion.     Comments: Midline lumbar tenderness to palpation.  5/5 strength  bilateral lower extremities.  Able to ambulate with a walker in the emergency department.  No saddle anesthesia.  Skin:    General: Skin is warm.  Neurological:     Mental Status: He is alert. Mental status is at baseline.     IMPRESSION / MDM / ASSESSMENT AND PLAN / ED COURSE  I reviewed the triage vital signs and the nursing notes.  Differential diagnosis including disc herniation, cauda equina/epidural compression syndrome  On review of telephone records with neurosurgery recommended MRI and given his urinary incontinence to evaluate for possible cauda equina or epidural compression syndrome.  Tachycardic on arrival, hypertensive  RADIOLOGY I independently reviewed imaging, my interpretation of imaging: MRI lumbar spine my evaluation and not seeing obvious epidural compression syndrome.  Read as multilevel degenerative changes most prominent at L3-L4 with severe bilateral stenosis and mild to moderate left foraminal stenosis.  LABS (all labs ordered are listed, but only abnormal results are displayed) Labs interpreted as -    Labs Reviewed - No data to display   MDM  No urinary retention in the emergency department.  Able to ambulate with walker.  Will discharge the patient with follow-up with neurosurgery.  Given return precautions for any worsening symptoms.     PROCEDURES:  Critical Care performed: No  Procedures  Patient's presentation is most consistent with acute presentation with potential threat to life or bodily function.   MEDICATIONS ORDERED IN ED: Medications - No data to display  FINAL CLINICAL IMPRESSION(S) / ED DIAGNOSES   Final diagnoses:  Spinal stenosis of lumbar region without neurogenic claudication     Rx / DC Orders   ED Discharge Orders     None        Note:  This document was prepared using Dragon voice recognition software and may include unintentional dictation errors.   Corena Herter, MD 02/03/23 [phone]

## 2023-02-03 NOTE — ED Triage Notes (Signed)
Pt sts that he has a herniated disk in his back and was at PT when his legs went numb and is not able to control his bladder. Pt sts that he talked to Dr. Gaylord Shih and he advised pt to come into ED.

## 2023-02-03 NOTE — Telephone Encounter (Signed)
Discussed with Dr Myer Haff... he recommends that he go to the ER for evaluation

## 2023-02-03 NOTE — Telephone Encounter (Signed)
Patient seen Dr.Yarbrough on 01/27/2023. He forgot to tell him that his is having urinary inconsistency. Patient saw physical therapy today and the therapist recommended he call the office. What should patient do?

## 2023-02-03 NOTE — ED Notes (Signed)
Bladder scan performed, 65ml in bladder at this time, MD notified

## 2023-02-03 NOTE — Telephone Encounter (Signed)
Called and spoke with Justin Nixon and advised as  discussed with Dr Myer Haff... he recommends that he go to the ER for evaluation. Patient stated he will try to go to he ER today.

## 2023-02-03 NOTE — Telephone Encounter (Signed)
I spoke with the patient.   He reports that he cannot hold his urine. He cannot tell when he needs to go, then he moves and urine comes out. He does endorse "a little numbness" that started sometime in June.  He denies bowel issues. This has been going on since before he saw Dr Myer Haff on 01/27/23 and is happening every day. However, he feels his symptoms are a lot worse since his last visit with Dr Myer Haff, particularly his back pain, left leg weakness/twitching.

## 2023-02-12 ENCOUNTER — Telehealth: Payer: Self-pay

## 2023-02-12 NOTE — Telephone Encounter (Signed)
Patient confirmed appt for 02/17/2023

## 2023-02-12 NOTE — Telephone Encounter (Signed)
I have reviewed the PT note from [facility] with Dr. Myer Haff and per the PT note patient has been discharged and also has gross motor loss during the session. He has an appt on 03/24/23, can you call the patient and offer him to come in sooner on 02/17/23 at 10am?

## 2023-02-17 ENCOUNTER — Encounter: Payer: Self-pay | Admitting: Neurosurgery

## 2023-02-17 ENCOUNTER — Ambulatory Visit: Payer: Medicare Other | Admitting: Neurosurgery

## 2023-02-17 VITALS — BP 122/72 | Ht 72.0 in | Wt 154.0 lb

## 2023-02-17 DIAGNOSIS — M5441 Lumbago with sciatica, right side: Secondary | ICD-10-CM | POA: Diagnosis not present

## 2023-02-17 DIAGNOSIS — M5442 Lumbago with sciatica, left side: Secondary | ICD-10-CM

## 2023-02-17 DIAGNOSIS — M5136 Other intervertebral disc degeneration, lumbar region: Secondary | ICD-10-CM

## 2023-02-17 DIAGNOSIS — Z981 Arthrodesis status: Secondary | ICD-10-CM

## 2023-02-17 DIAGNOSIS — G8929 Other chronic pain: Secondary | ICD-10-CM

## 2023-02-17 NOTE — Addendum Note (Signed)
Addended by: Sharlot Gowda on: 02/17/2023 11:09 AM   Modules accepted: Orders

## 2023-02-17 NOTE — Patient Instructions (Signed)
Please see below for information in regards to your upcoming surgery:   Planned surgery: L3-4 lateral lumbar interbody fusion, L3-5 posterior spinal fusion   Surgery date: 03/30/23 at [facility] - you will find out your arrival time the business day before your surgery.   Pre-op appointment at [facility] Pre-admit Testing: we will call you with a date/time for this. Pre-admit testing is located on the first floor of the [facility] building, [address] [facility] [address], [address]. Please bring all prescriptions in the original prescription bottles to your appointment, even if you have reviewed medications by phone with a pharmacy representative. During this appointment, they will advise you which medications you can take the morning of surgery, and which medications you will need to hold for surgery. Pre-op labs may be done at your pre-op appointment. You are not required to fast for these labs. Should you need to change your pre-op appointment, please call Pre-admit testing at [phone].    Surgical clearance: we will send a clearance form to Dr Letitia Libra    NSAIDS (Non-steroidal anti-inflammatory drugs): because you are having a fusion, please avoid taking any NSAIDS (examples: ibuprofen, motrin, aleve, naproxen, meloxicam, diclofenac) for 3 months after surgery. Celebrex is an exception and is OK to take, if prescribed. Tylenol is not an NSAID.    Home health physical therapy: [facility] will contact you regarding home health physical therapy for after surgery.Their number is [phone].    Because you are having a fusion or arthroplasty: for appointments after your 2 week follow-up: please arrive at the [facility] outpatient imaging center ([address], [place]) or [facility] one hour prior to your appointment for x-rays. This applies to every appointment after your 2 week follow-up.  Failure to do so may result in your appointment being rescheduled.    Common restrictions after surgery: No bending, lifting, or twisting ("BLT"). Avoid lifting objects heavier than 10 pounds for the first 6 weeks after surgery. Where possible, avoid household activities that involve lifting, bending, reaching, pushing, or pulling such as laundry, vacuuming, grocery shopping, and childcare. Try to arrange for help from friends and family for these activities while your back heals. Do not drive while taking prescription pain medication. Weeks 6 through 12 after surgery: avoid lifting more than 25 pounds.     How to contact us:  If you have any questions/concerns before or after surgery, you can reach [place] at [phone], or you can send a mychart message. We can be reached by phone or mychart 8am-4pm, Monday-Friday.  *Please note: Calls after 4pm are forwarded to a third party answering service. Mychart messages are not routinely monitored during evenings, weekends, and holidays. Please call our office to contact the answering service for urgent concerns during non-business hours.    If you have FMLA/disability paperwork, please drop it off or fax it to [phone], attention Patty.   Appointments/FMLA & disability paperwork: Patty & Cristin  Nurse: Royston Cowper  Medical assistants: Laurann Montana, & Lyla Son Physician Assistants: Manning Charity & Drake Leach Surgeons: Venetia Night, MD & Ernestine Mcmurray, MD

## 2023-02-17 NOTE — Progress Notes (Signed)
DOS: 11/18/21 (L4-5 XLIF/PSF)  HISTORY OF PRESENT ILLNESS: 02/17/2023 He returns to see me after finishing physical therapy.  This physical therapy made him worse.  He is still having a lot of back pain and leg pain.  He is not having leg pain in both legs.  01/27/2023 He had a left-sided L3-4 transforaminal epidural steroid injection which was very helpful during the anesthetic phase.  He continues to have left anterior thigh pain.  11/25/2022 Mr. Noha Karasik is status post the above surgery.  He is having some pain down his leg intermittently.  Overall, he is still better than it was prior to surgery.  No family history on file.  Social History   Tobacco Use   Smoking status: Every Day    Current packs/day: 0.50    Average packs/day: 0.5 packs/day for 22.0 years (11.0 ttl pk-yrs)    Types: Cigarettes   Smokeless tobacco: Never  Vaping Use   Vaping status: Never Used  Substance Use Topics   Alcohol use: Yes    Comment: OCC   Drug use: No    Current Meds  Medication Sig   gabapentin (NEURONTIN) 300 MG capsule TAKE 1 CAPSULE(300 MG) BY MOUTH AT BEDTIME   lisinopril (ZESTRIL) 20 MG tablet Take by mouth.   lurasidone (LATUDA) 40 MG TABS tablet Take 40 mg by mouth daily after supper.   ondansetron (ZOFRAN-ODT) 8 MG disintegrating tablet Take 1 tablet (8 mg total) by mouth every 8 (eight) hours as needed for nausea or vomiting.   QUEtiapine (SEROQUEL) 100 MG tablet Take 50-100 mg by mouth at bedtime.   Allergies  Allergen Reactions   Penicillins Itching    TOLERATED CEFAZOLIN 10/10/2020  Has patient had a PCN reaction causing immediate rash, facial/tongue/throat swelling, SOB or lightheadedness with hypotension: No  Has patient had a PCN reaction causing severe rash involving mucus membranes or skin necrosis: No  Has patient had a PCN reaction that required hospitalization No  Has patient had a PCN reaction occurring within the last 10 years: Yes  If all of the above  answers are "NO", then may proceed with Cephalosporin use.  Has patient had a PCN reaction causing immediate rash, facial/tongue/throat swelling, SOB or lightheadedness with hypotension: No  Has patient had a PCN reaction causing severe rash involving mucus membranes or skin necrosis: No  Has patient had a PCN reaction that required hospitalization No  Has patient had a PCN reaction occurring within the last 10 years: Yes  If all of the above answers are "NO", then may proceed with Cephalosporin use.  TOLERATED CEFAZOLIN 10/10/2020      PHYSICAL EXAMINATION:   Vitals:   02/17/23 1010  BP: 122/72   General: Patient is well developed, well nourished, calm, collected, and in no apparent distress.  NEUROLOGICAL:  General: In no acute distress.  Awake, alert, oriented to person, place, and time. Pupils equal round and reactive to light.   Strength:  Side Iliopsoas Quads Hamstring PF DF EHL  R 5 5 5 5 5 5   L 5 5 5 5 5 5    Incision c/d/i   ROS (Neurologic): Negative except as noted above  IMAGING: No complications noted on November 2023 x-rays  MRI L spine 12/05/22 IMPRESSION: 1. New or larger left extraforaminal disc extrusion at L3-4 with potential left L3 nerve impingement. 2. New right subarticular disc extrusion at L2-3 with potential right L3 nerve root impingement. 3. Interval L4-5 fusion without stenosis.  Electronically Signed   By: Sebastian Ache M.D.   On: 12/05/2022 16:48   ASSESSMENT/PLAN:  Brodyn Depuy is doing fair after L4-5 fusion.  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  He is having bilateral back pain with bilateral sciatica.  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    He has tried and failed conservative management.  He tried physical therapy without improvement.  He has quit smoking.  At this point, no further conservative management is indicated.  I think he has 2 different options for  intervention.  However, he is having bilateral leg pain.  A left-sided L3-4 far lateral discectomy would not address his leg pain.  He is having symptoms of both exiting and traversing nerve root pain in both legs due to his stenosis at L3-4.  As such, I recommended L3-4 lateral lumbar interbody fusion from a right-sided approach to address his bilateral concerns followed by extension of his posterior instrumentation and fusion.   I discussed the planned procedure at length with the patient, including the risks, benefits, alternatives, and indications. The risks discussed include but are not limited to bleeding, infection, need for reoperation, spinal fluid leak, stroke, vision loss, anesthetic complication, coma, paralysis, and even death. I also described the possibility of psoas weakness and paresthesias. I described in detail that improvement was not guaranteed.  The patient expressed understanding of these risks, and asked that we proceed with surgery. I described the surgery in layman's terms, and gave ample opportunity for questions, which were answered to the best of my ability.   Venetia Night MD, [facility] Department of Neurosurgery

## 2023-02-27 ENCOUNTER — Other Ambulatory Visit: Payer: Self-pay

## 2023-02-27 DIAGNOSIS — Z01818 Encounter for other preprocedural examination: Secondary | ICD-10-CM

## 2023-03-19 ENCOUNTER — Other Ambulatory Visit: Payer: Self-pay

## 2023-03-19 ENCOUNTER — Encounter
Admission: RE | Admit: 2023-03-19 | Discharge: 2023-03-19 | Disposition: A | Discharge status: Home / Self Care | Payer: Medicare Other | Source: Ambulatory Visit | Attending: Neurosurgery | Admitting: Neurosurgery

## 2023-03-19 VITALS — BP 145/97 | HR 70 | Resp 14 | Ht 72.0 in | Wt 160.0 lb

## 2023-03-19 DIAGNOSIS — Z01818 Encounter for other preprocedural examination: Secondary | ICD-10-CM | POA: Diagnosis not present

## 2023-03-19 DIAGNOSIS — I10 Essential (primary) hypertension: Secondary | ICD-10-CM | POA: Insufficient documentation

## 2023-03-19 DIAGNOSIS — Z87891 Personal history of nicotine dependence: Secondary | ICD-10-CM | POA: Diagnosis not present

## 2023-03-19 HISTORY — DX: Essential (primary) hypertension: I10

## 2023-03-19 LAB — TYPE AND SCREEN
ABO/RH(D): O POS
Antibody Screen: NEGATIVE

## 2023-03-19 LAB — BASIC METABOLIC PANEL
Anion gap: 10 (ref 5–15)
BUN: 17 mg/dL (ref 6–20)
CO2: 26 mmol/L (ref 22–32)
Calcium: 8.9 mg/dL (ref 8.9–10.3)
Chloride: 103 mmol/L (ref 98–111)
Creatinine, Ser: 1.11 mg/dL (ref 0.61–1.24)
GFR, Estimated: >60 mL/min (ref >60)
Glucose, Bld: 84 mg/dL (ref 70–99)
Potassium: 3.9 mmol/L (ref 3.5–5.1)
Sodium: 139 mmol/L (ref 135–145)

## 2023-03-19 LAB — URINALYSIS, ROUTINE W REFLEX MICROSCOPIC
Bilirubin Urine: NEGATIVE
Glucose, UA: NEGATIVE mg/dL
Hgb urine dipstick: NEGATIVE
Ketones, ur: NEGATIVE mg/dL
Leukocytes,Ua: NEGATIVE
Nitrite: NEGATIVE
Protein, ur: NEGATIVE mg/dL
Specific Gravity, Urine: 1.016 (ref 1.005–1.030)
pH: 5 (ref 5.0–8.0)

## 2023-03-19 LAB — CBC
HCT: 42.6 % (ref 39.0–52.0)
Hemoglobin: 14.7 g/dL (ref 13.0–17.0)
MCH: 32.2 pg (ref 26.0–34.0)
MCHC: 34.5 g/dL (ref 30.0–36.0)
MCV: 93.4 fL (ref 80.0–100.0)
Platelets: 304 10*3/uL (ref 150–400)
RBC: 4.56 MIL/uL (ref 4.22–5.81)
RDW: 13.2 % (ref 11.5–15.5)
WBC: 6.2 10*3/uL (ref 4.0–10.5)
nRBC: 0 % (ref 0.0–0.2)

## 2023-03-19 LAB — SURGICAL PCR SCREEN
MRSA, PCR: NEGATIVE
Staphylococcus aureus: NEGATIVE

## 2023-03-19 NOTE — Patient Instructions (Addendum)
Your procedure is scheduled on: Monday 03/30/23 To find out your arrival time, please call [phone] between 1PM - 3PM on:   Friday 03/27/23 Report to the Registration Desk on the 1st floor of the [facility]. FREE Valet parking is available.  If your arrival time is 6:00 am, do not arrive before that time as the [facility] entrance doors do not open until 6:00 am.  REMEMBER: Instructions that are not followed completely may result in serious medical risk, up to and including death; or upon the discretion of your surgeon and anesthesiologist your surgery may need to be rescheduled.  Do not eat food after midnight the night before surgery.  No gum chewing or hard candies.  You may however, drink CLEAR liquids up to 2 hours before you are scheduled to arrive for your surgery. Do not drink anything within 2 hours of your scheduled arrival time.  Clear liquids include: - water  - apple juice without pulp - gatorade (not RED colors) - black coffee or tea (Do NOT add milk or creamers to the coffee or tea) Do NOT drink anything that is not on this list.  Type 1 and Type 2 diabetics should only drink water.  One week prior to surgery: Stop Anti-inflammatories (NSAIDS) such as Advil, Aleve, Ibuprofen, Motrin, Naproxen, Naprosyn and Aspirin based products such as Excedrin, Goody's Powder, BC Powder. You may however, continue to take Tylenol if needed for pain up until the day of surgery.  Stop ANY OVER THE COUNTER supplements and vitamins until after surgery.  Continue taking all prescribed medications.   TAKE ONLY THESE MEDICATIONS THE MORNING OF SURGERY WITH A SIP OF WATER:  NONE  No Alcohol for 24 hours before or after surgery.  No Smoking including e-cigarettes for 24 hours before surgery.  No chewable tobacco products for at least 6 hours before surgery.  No nicotine patches on the day of surgery.  Do not use any "recreational" drugs for at least a week (preferably 2 weeks)  before your surgery.  Please be advised that the combination of cocaine and anesthesia may have negative outcomes, up to and including death. If you test positive for cocaine, your surgery will be cancelled.  On the morning of surgery brush your teeth with toothpaste and water, you may rinse your mouth with mouthwash if you wish. Do not swallow any toothpaste or mouthwash.  Use CHG Soap or wipes as directed on instruction sheet. Shower daily for 5 days with CHG starting on Thursday 03/26/23  Do not wear lotions, powders, or perfumes/cologne on the day of surgery.   Do not shave body hair from the neck down 48 hours before surgery. You can shave face and or head.  Wear comfortable clothing (specific to your surgery type) to the hospital.  Do not wear jewelry, make-up, hairpins, clips or nail polish.  Contact lenses, hearing aids and dentures may not be worn into surgery.  Do not bring valuables to the hospital. [facility] is not responsible for any missing/lost belongings or valuables.   Notify your doctor if there is any change in your medical condition (cold, fever, infection).  If you are being discharged the day of surgery, you will not be allowed to drive home. You will need a responsible individual to drive you home and stay with you for 24 hours after surgery.   If you are taking public transportation, you will need to have a responsible individual with you.  If you are being admitted  to the hospital overnight, leave your suitcase in the car. After surgery it may be brought to your room.  In case of increased patient census, it may be necessary for you, the patient, to continue your postoperative care in the Same Day Surgery department.  After surgery, you can help prevent lung complications by doing breathing exercises.  Take deep breaths and cough every 1-2 hours. Your doctor may order a device called an Incentive Spirometer to help you take deep breaths. When coughing or  sneezing, hold a pillow firmly against your incision with both hands. This is called "splinting." Doing this helps protect your incision. It also decreases belly discomfort.  Surgery Visitation Policy:  Patients undergoing a surgery or procedure may have two family members or support persons with them as long as the person is not COVID-19 positive or experiencing its symptoms.   Inpatient Visitation:    Visiting hours are 7 a.m. to 8 p.m. Up to four visitors are allowed at one time in a patient room. The visitors may rotate out with other people during the day. One designated support person (adult) may remain overnight.  Please call the Pre-admissions Testing Dept. at [phone] if you have any questions about these instructions.    Pre-operative 5 CHG Bath Instructions   You can play a key role in reducing the risk of infection after surgery. Your skin needs to be as free of germs as possible. You can reduce the number of germs on your skin by washing with CHG (chlorhexidine gluconate) soap before surgery. CHG is an antiseptic soap that kills germs and continues to kill germs even after washing.   DO NOT use if you have an allergy to chlorhexidine/CHG or antibacterial soaps. If your skin becomes reddened or irritated, stop using the CHG and notify one of our RNs at [phone].   Please shower with the CHG soap starting 4 days before surgery using the following schedule:     Please keep in mind the following:  DO NOT shave, including legs and underarms, starting the day of your first shower.   You may shave your face at any point before/day of surgery.  Place clean sheets on your bed the day you start using CHG soap. Use a clean washcloth (not used since being washed) for each shower. DO NOT sleep with pets once you start using the CHG.   CHG Shower Instructions:  If you choose to wash your hair and private area, wash first with your normal shampoo/soap.  After you use  shampoo/soap, rinse your hair and body thoroughly to remove shampoo/soap residue.  Turn the water OFF and apply about 3 tablespoons (45 ml) of CHG soap to a CLEAN washcloth.  Apply CHG soap ONLY FROM YOUR NECK DOWN TO YOUR TOES (washing for 3-5 minutes)  DO NOT use CHG soap on face, private areas, open wounds, or sores.  Pay special attention to the area where your surgery is being performed.  If you are having back surgery, having someone wash your back for you may be helpful. Wait 2 minutes after CHG soap is applied, then you may rinse off the CHG soap.  Pat dry with a clean towel  Put on clean clothes/pajamas   If you choose to wear lotion, please use ONLY the CHG-compatible lotions on the back of this paper.     Additional instructions for the day of surgery: DO NOT APPLY any lotions, deodorants, cologne, or perfumes.   Put on clean/comfortable clothes.  Brush your teeth.  Ask your nurse before applying any prescription medications to the skin.      CHG Compatible Lotions   Aveeno Moisturizing lotion  Cetaphil Moisturizing Cream  Cetaphil Moisturizing Lotion  Clairol Herbal Essence Moisturizing Lotion, Dry Skin  Clairol Herbal Essence Moisturizing Lotion, Extra Dry Skin  Clairol Herbal Essence Moisturizing Lotion, Normal Skin  Curel Age Defying Therapeutic Moisturizing Lotion with Alpha Hydroxy  Curel Extreme Care Body Lotion  Curel Soothing Hands Moisturizing Hand Lotion  Curel Therapeutic Moisturizing Cream, Fragrance-Free  Curel Therapeutic Moisturizing Lotion, Fragrance-Free  Curel Therapeutic Moisturizing Lotion, Original Formula  Eucerin Daily Replenishing Lotion  Eucerin Dry Skin Therapy Plus Alpha Hydroxy Crme  Eucerin Dry Skin Therapy Plus Alpha Hydroxy Lotion  Eucerin Original Crme  Eucerin Original Lotion  Eucerin Plus Crme Eucerin Plus Lotion  Eucerin TriLipid Replenishing Lotion  Keri Anti-Bacterial Hand Lotion  Keri Deep Conditioning Original Lotion Dry  Skin Formula Softly Scented  Keri Deep Conditioning Original Lotion, Fragrance Free Sensitive Skin Formula  Keri Lotion Fast Absorbing Fragrance Free Sensitive Skin Formula  Keri Lotion Fast Absorbing Softly Scented Dry Skin Formula  Keri Original Lotion  Keri Skin Renewal Lotion Keri Silky Smooth Lotion  Keri Silky Smooth Sensitive Skin Lotion  Nivea Body Creamy Conditioning Oil  Nivea Body Extra Enriched Teacher, adult education Moisturizing Lotion Nivea Crme  Nivea Skin Firming Lotion  NutraDerm 30 Skin Lotion  NutraDerm Skin Lotion  NutraDerm Therapeutic Skin Cream  NutraDerm Therapeutic Skin Lotion  ProShield Protective Hand Cream  Provon moisturizing lotion

## 2023-03-23 LAB — NICOTINE/COTININE METABOLITES
Cotinine: 4.2 ng/mL
Nicotine: <1 ng/mL

## 2023-03-24 ENCOUNTER — Ambulatory Visit: Payer: Medicare Other | Admitting: Neurosurgery

## 2023-03-29 MED ORDER — FAMOTIDINE 20 MG PO TABS
20.0000 mg | ORAL_TABLET | Freq: Once | ORAL | Status: AC
  Administered 2023-03-30: 20 mg via ORAL

## 2023-03-29 MED ORDER — ORAL CARE MOUTH RINSE: 15.0000 mL | Freq: Once | OROMUCOSAL | Status: AC

## 2023-03-29 MED ORDER — CEFAZOLIN IN SODIUM CHLORIDE 2-0.9 GM/100ML-% IV SOLN
2.0000 g | Freq: Once | INTRAVENOUS | Status: DC
  Filled 2023-03-29: qty 100

## 2023-03-29 MED ORDER — CEFAZOLIN SODIUM-DEXTROSE 2-4 GM/100ML-% IV SOLN
2.0000 g | INTRAVENOUS | Status: AC
  Administered 2023-03-30: 2 g via INTRAVENOUS

## 2023-03-29 MED ORDER — CHLORHEXIDINE GLUCONATE 0.12 % MT SOLN
15.0000 mL | Freq: Once | OROMUCOSAL | Status: AC
  Administered 2023-03-30: 15 mL via OROMUCOSAL

## 2023-03-29 MED ORDER — VANCOMYCIN HCL IN DEXTROSE 1-5 GM/200ML-% IV SOLN
1000.0000 mg | Freq: Once | INTRAVENOUS | Status: AC
  Administered 2023-03-30: 1000 mg via INTRAVENOUS

## 2023-03-29 MED ORDER — LACTATED RINGERS IV SOLN: INTRAVENOUS | Status: DC

## 2023-03-30 ENCOUNTER — Encounter: Payer: Self-pay | Admitting: Neurosurgery

## 2023-03-30 ENCOUNTER — Inpatient Hospital Stay: Payer: Medicare Other | Admitting: Registered Nurse

## 2023-03-30 ENCOUNTER — Other Ambulatory Visit: Payer: Self-pay

## 2023-03-30 ENCOUNTER — Inpatient Hospital Stay: Payer: Medicare Other

## 2023-03-30 ENCOUNTER — Inpatient Hospital Stay
Admission: RE | Admit: 2023-03-30 | Discharge: 2023-04-01 | DRG: 455 | Disposition: A | Discharge status: Home / Self Care | Payer: Medicare Other | Attending: Neurosurgery | Admitting: Neurosurgery

## 2023-03-30 ENCOUNTER — Encounter
Admission: RE | Disposition: A | Discharge status: Home / Self Care | Payer: Self-pay | Source: Home / Self Care | Attending: Neurosurgery

## 2023-03-30 DIAGNOSIS — M5441 Lumbago with sciatica, right side: Secondary | ICD-10-CM | POA: Diagnosis present

## 2023-03-30 DIAGNOSIS — G8929 Other chronic pain: Secondary | ICD-10-CM | POA: Diagnosis present

## 2023-03-30 DIAGNOSIS — Z87891 Personal history of nicotine dependence: Secondary | ICD-10-CM | POA: Diagnosis not present

## 2023-03-30 DIAGNOSIS — F419 Anxiety disorder, unspecified: Secondary | ICD-10-CM | POA: Diagnosis present

## 2023-03-30 DIAGNOSIS — K219 Gastro-esophageal reflux disease without esophagitis: Secondary | ICD-10-CM | POA: Diagnosis present

## 2023-03-30 DIAGNOSIS — M5126 Other intervertebral disc displacement, lumbar region: Secondary | ICD-10-CM | POA: Diagnosis present

## 2023-03-30 DIAGNOSIS — M51369 Other intervertebral disc degeneration, lumbar region without mention of lumbar back pain or lower extremity pain: Secondary | ICD-10-CM

## 2023-03-30 DIAGNOSIS — M5136 Other intervertebral disc degeneration, lumbar region: Secondary | ICD-10-CM

## 2023-03-30 DIAGNOSIS — Z981 Arthrodesis status: Secondary | ICD-10-CM

## 2023-03-30 DIAGNOSIS — Z880 Allergy status to penicillin: Secondary | ICD-10-CM | POA: Diagnosis not present

## 2023-03-30 DIAGNOSIS — M5442 Lumbago with sciatica, left side: Secondary | ICD-10-CM | POA: Diagnosis present

## 2023-03-30 DIAGNOSIS — Z01818 Encounter for other preprocedural examination: Secondary | ICD-10-CM

## 2023-03-30 DIAGNOSIS — Z79899 Other long term (current) drug therapy: Secondary | ICD-10-CM

## 2023-03-30 HISTORY — PX: APPLICATION OF INTRAOPERATIVE CT SCAN: SHX6668

## 2023-03-30 HISTORY — PX: ANTERIOR LUMBAR FUSION: SHX1170

## 2023-03-30 SURGERY — ANTERIOR LUMBAR FUSION 1 LEVEL
Anesthesia: General

## 2023-03-30 MED ORDER — ONDANSETRON HCL 4 MG/2ML IJ SOLN: 4.0000 mg | Freq: Once | INTRAMUSCULAR | Status: DC | PRN

## 2023-03-30 MED ORDER — PROPOFOL 1000 MG/100ML IV EMUL
INTRAVENOUS | Status: AC
  Filled 2023-03-30: qty 100

## 2023-03-30 MED ORDER — MIDAZOLAM HCL 2 MG/2ML IJ SOLN
INTRAMUSCULAR | Status: AC
  Filled 2023-03-30: qty 2

## 2023-03-30 MED ORDER — LISINOPRIL 20 MG PO TABS
ORAL_TABLET | ORAL | Status: AC
  Filled 2023-03-30: qty 1

## 2023-03-30 MED ORDER — ONDANSETRON HCL 4 MG/2ML IJ SOLN
INTRAMUSCULAR | Status: AC
  Filled 2023-03-30: qty 2

## 2023-03-30 MED ORDER — FENTANYL CITRATE (PF) 100 MCG/2ML IJ SOLN
INTRAMUSCULAR | Status: AC
  Filled 2023-03-30: qty 2

## 2023-03-30 MED ORDER — GABAPENTIN 300 MG PO CAPS
ORAL_CAPSULE | ORAL | Status: AC
  Filled 2023-03-30: qty 1

## 2023-03-30 MED ORDER — DOCUSATE SODIUM 100 MG PO CAPS
100.0000 mg | ORAL_CAPSULE | Freq: Two times a day (BID) | ORAL | Status: DC
  Administered 2023-03-30 – 2023-04-01 (×4): 100 mg via ORAL

## 2023-03-30 MED ORDER — POLYETHYLENE GLYCOL 3350 17 G PO PACK: 17.0000 g | PACK | Freq: Every day | ORAL | Status: DC | PRN

## 2023-03-30 MED ORDER — SUCCINYLCHOLINE CHLORIDE 200 MG/10ML IV SOSY
PREFILLED_SYRINGE | INTRAVENOUS | Status: AC
  Filled 2023-03-30: qty 10

## 2023-03-30 MED ORDER — OXYCODONE HCL 5 MG PO TABS
ORAL_TABLET | ORAL | Status: AC
  Filled 2023-03-30: qty 1

## 2023-03-30 MED ORDER — METHOCARBAMOL 500 MG PO TABS: 500.0000 mg | ORAL_TABLET | Freq: Four times a day (QID) | ORAL | Status: DC | PRN

## 2023-03-30 MED ORDER — PHENYLEPHRINE 80 MCG/ML (10ML) SYRINGE FOR IV PUSH (FOR BLOOD PRESSURE SUPPORT)
PREFILLED_SYRINGE | INTRAVENOUS | Status: AC
  Filled 2023-03-30: qty 10

## 2023-03-30 MED ORDER — METHOCARBAMOL 1000 MG/10ML IJ SOLN
500.0000 mg | Freq: Four times a day (QID) | INTRAVENOUS | Status: DC | PRN
  Administered 2023-03-30: 500 mg via INTRAVENOUS
  Filled 2023-03-30: qty 5

## 2023-03-30 MED ORDER — DEXMEDETOMIDINE HCL IN NACL 80 MCG/20ML IV SOLN
INTRAVENOUS | Status: DC | PRN
Start: 2023-03-30 — End: 2023-03-30
  Administered 2023-03-30 (×2): 8 ug via INTRAVENOUS

## 2023-03-30 MED ORDER — KETOROLAC TROMETHAMINE 30 MG/ML IJ SOLN
INTRAMUSCULAR | Status: DC | PRN
  Administered 2023-03-30: 15 mg via INTRAVENOUS

## 2023-03-30 MED ORDER — FENTANYL CITRATE (PF) 100 MCG/2ML IJ SOLN: 25.0000 ug | INTRAMUSCULAR | Status: DC | PRN

## 2023-03-30 MED ORDER — PHENYLEPHRINE HCL (PRESSORS) 10 MG/ML IV SOLN
INTRAVENOUS | Status: DC | PRN
  Administered 2023-03-30: 80 ug via INTRAVENOUS

## 2023-03-30 MED ORDER — CHLORHEXIDINE GLUCONATE 0.12 % MT SOLN
OROMUCOSAL | Status: AC
  Filled 2023-03-30: qty 15

## 2023-03-30 MED ORDER — OXYCODONE HCL 5 MG/5ML PO SOLN: 5.0000 mg | Freq: Once | ORAL | Status: AC | PRN

## 2023-03-30 MED ORDER — GLYCOPYRROLATE 0.2 MG/ML IJ SOLN
INTRAMUSCULAR | Status: DC | PRN
  Administered 2023-03-30: .1 mg via INTRAVENOUS

## 2023-03-30 MED ORDER — ONDANSETRON HCL 4 MG PO TABS: 4.0000 mg | ORAL_TABLET | Freq: Four times a day (QID) | ORAL | Status: DC | PRN

## 2023-03-30 MED ORDER — QUETIAPINE FUMARATE 100 MG PO TABS
100.0000 mg | ORAL_TABLET | Freq: Every day | ORAL | Status: DC
  Administered 2023-03-30 – 2023-03-31 (×2): 100 mg via ORAL
  Filled 2023-03-30 (×2): qty 1

## 2023-03-30 MED ORDER — DEXAMETHASONE SODIUM PHOSPHATE 10 MG/ML IJ SOLN
INTRAMUSCULAR | Status: AC
  Filled 2023-03-30: qty 1

## 2023-03-30 MED ORDER — ACETAMINOPHEN 10 MG/ML IV SOLN
INTRAVENOUS | Status: DC | PRN
  Administered 2023-03-30: 1000 mg via INTRAVENOUS

## 2023-03-30 MED ORDER — LIDOCAINE HCL (PF) 2 % IJ SOLN
INTRAMUSCULAR | Status: AC
  Filled 2023-03-30: qty 5

## 2023-03-30 MED ORDER — SURGIFLO WITH THROMBIN (HEMOSTATIC MATRIX KIT) OPTIME
TOPICAL | Status: DC | PRN
  Administered 2023-03-30: 1 via TOPICAL

## 2023-03-30 MED ORDER — OXYCODONE HCL 5 MG PO TABS
5.0000 mg | ORAL_TABLET | ORAL | Status: DC | PRN
  Administered 2023-03-30 – 2023-04-01 (×8): 5 mg via ORAL

## 2023-03-30 MED ORDER — ONDANSETRON HCL 4 MG/2ML IJ SOLN
4.0000 mg | Freq: Four times a day (QID) | INTRAMUSCULAR | Status: DC | PRN
  Administered 2023-03-31: 4 mg via INTRAVENOUS

## 2023-03-30 MED ORDER — MIDAZOLAM HCL 2 MG/2ML IJ SOLN
INTRAMUSCULAR | Status: DC | PRN
  Administered 2023-03-30: 2 mg via INTRAVENOUS

## 2023-03-30 MED ORDER — VANCOMYCIN HCL IN DEXTROSE 1-5 GM/200ML-% IV SOLN
INTRAVENOUS | Status: AC
  Filled 2023-03-30: qty 200

## 2023-03-30 MED ORDER — KETOROLAC TROMETHAMINE 15 MG/ML IJ SOLN
INTRAMUSCULAR | Status: AC
  Filled 2023-03-30: qty 1

## 2023-03-30 MED ORDER — GABAPENTIN 300 MG PO CAPS
300.0000 mg | ORAL_CAPSULE | Freq: Every day | ORAL | Status: DC
  Administered 2023-03-30 – 2023-03-31 (×2): 300 mg via ORAL

## 2023-03-30 MED ORDER — KETOROLAC TROMETHAMINE 30 MG/ML IJ SOLN
INTRAMUSCULAR | Status: AC
  Filled 2023-03-30: qty 1

## 2023-03-30 MED ORDER — BISACODYL 10 MG RE SUPP: 10.0000 mg | Freq: Every day | RECTAL | Status: DC | PRN

## 2023-03-30 MED ORDER — PHENYLEPHRINE HCL (PRESSORS) 10 MG/ML IV SOLN
INTRAVENOUS | Status: AC
  Filled 2023-03-30: qty 1

## 2023-03-30 MED ORDER — NALOXONE HCL 0.4 MG/ML IJ SOLN
INTRAMUSCULAR | Status: AC
  Filled 2023-03-30: qty 1

## 2023-03-30 MED ORDER — PHENYLEPHRINE HCL-NACL 20-0.9 MG/250ML-% IV SOLN
INTRAVENOUS | Status: DC | PRN
Start: 2023-03-30 — End: 2023-03-30
  Administered 2023-03-30: 40 ug/min via INTRAVENOUS

## 2023-03-30 MED ORDER — SODIUM CHLORIDE 0.9% FLUSH
3.0000 mL | Freq: Two times a day (BID) | INTRAVENOUS | Status: DC
  Administered 2023-03-30 – 2023-04-01 (×4): 3 mL via INTRAVENOUS

## 2023-03-30 MED ORDER — IRRISEPT - 450ML BOTTLE WITH 0.05% CHG IN STERILE WATER, USP 99.95% OPTIME
TOPICAL | Status: DC | PRN
  Administered 2023-03-30: 450 mL

## 2023-03-30 MED ORDER — FAMOTIDINE 20 MG PO TABS
ORAL_TABLET | ORAL | Status: AC
  Filled 2023-03-30: qty 1

## 2023-03-30 MED ORDER — LISINOPRIL 20 MG PO TABS
20.0000 mg | ORAL_TABLET | Freq: Every day | ORAL | Status: DC
  Administered 2023-03-30 – 2023-03-31 (×2): 20 mg via ORAL

## 2023-03-30 MED ORDER — SODIUM CHLORIDE FLUSH 0.9 % IV SOLN
INTRAVENOUS | Status: AC
  Filled 2023-03-30: qty 10

## 2023-03-30 MED ORDER — ACETAMINOPHEN 500 MG PO TABS
ORAL_TABLET | ORAL | Status: AC
  Filled 2023-03-30: qty 2

## 2023-03-30 MED ORDER — BUPIVACAINE-EPINEPHRINE (PF) 0.5% -1:200000 IJ SOLN
INTRAMUSCULAR | Status: DC | PRN
  Administered 2023-03-30: 16 mL

## 2023-03-30 MED ORDER — OXYCODONE HCL 5 MG PO TABS
5.0000 mg | ORAL_TABLET | Freq: Once | ORAL | Status: AC | PRN
  Administered 2023-03-30: 5 mg via ORAL

## 2023-03-30 MED ORDER — SENNA 8.6 MG PO TABS
ORAL_TABLET | ORAL | Status: AC
  Filled 2023-03-30: qty 1

## 2023-03-30 MED ORDER — 0.9 % SODIUM CHLORIDE (POUR BTL) OPTIME
TOPICAL | Status: DC | PRN
  Administered 2023-03-30: 300 mL

## 2023-03-30 MED ORDER — FENTANYL CITRATE (PF) 100 MCG/2ML IJ SOLN
INTRAMUSCULAR | Status: DC | PRN
  Administered 2023-03-30: 100 ug via INTRAVENOUS
  Administered 2023-03-30: 50 ug via INTRAVENOUS

## 2023-03-30 MED ORDER — ACETAMINOPHEN 10 MG/ML IV SOLN: 1000.0000 mg | Freq: Once | INTRAVENOUS | Status: DC | PRN

## 2023-03-30 MED ORDER — PROPOFOL 10 MG/ML IV BOLUS
INTRAVENOUS | Status: AC
  Filled 2023-03-30: qty 20

## 2023-03-30 MED ORDER — MORPHINE SULFATE (PF) 2 MG/ML IV SOLN: 1.0000 mg | INTRAVENOUS | Status: AC | PRN

## 2023-03-30 MED ORDER — BUPIVACAINE HCL (PF) 0.5 % IJ SOLN
INTRAMUSCULAR | Status: AC
  Filled 2023-03-30: qty 30

## 2023-03-30 MED ORDER — SODIUM CHLORIDE 0.9% FLUSH: 3.0000 mL | INTRAVENOUS | Status: DC | PRN

## 2023-03-30 MED ORDER — PHENOL 1.4 % MT LIQD: 1.0000 | OROMUCOSAL | Status: DC | PRN

## 2023-03-30 MED ORDER — MENTHOL 3 MG MT LOZG: 1.0000 | LOZENGE | OROMUCOSAL | Status: DC | PRN

## 2023-03-30 MED ORDER — DOCUSATE SODIUM 100 MG PO CAPS
ORAL_CAPSULE | ORAL | Status: AC
  Filled 2023-03-30: qty 1

## 2023-03-30 MED ORDER — SODIUM CHLORIDE 0.9 % IV SOLN: 250.0000 mL | INTRAVENOUS | Status: DC

## 2023-03-30 MED ORDER — SODIUM CHLORIDE (PF) 0.9 % IJ SOLN
INTRAMUSCULAR | Status: DC | PRN
  Administered 2023-03-30: 60 mL

## 2023-03-30 MED ORDER — ACETAMINOPHEN 500 MG PO TABS
1000.0000 mg | ORAL_TABLET | Freq: Four times a day (QID) | ORAL | Status: DC
  Administered 2023-03-30 – 2023-04-01 (×7): 1000 mg via ORAL

## 2023-03-30 MED ORDER — CEFAZOLIN SODIUM-DEXTROSE 2-4 GM/100ML-% IV SOLN
INTRAVENOUS | Status: AC
  Filled 2023-03-30: qty 100

## 2023-03-30 MED ORDER — LURASIDONE HCL 40 MG PO TABS
40.0000 mg | ORAL_TABLET | Freq: Every day | ORAL | Status: DC
  Filled 2023-03-30 (×3): qty 1

## 2023-03-30 MED ORDER — KETOROLAC TROMETHAMINE 15 MG/ML IJ SOLN
15.0000 mg | Freq: Four times a day (QID) | INTRAMUSCULAR | Status: AC
  Administered 2023-03-30 – 2023-03-31 (×4): 15 mg via INTRAVENOUS

## 2023-03-30 MED ORDER — LIDOCAINE HCL (CARDIAC) PF 100 MG/5ML IV SOSY
PREFILLED_SYRINGE | INTRAVENOUS | Status: DC | PRN
  Administered 2023-03-30: 80 mg via INTRAVENOUS

## 2023-03-30 MED ORDER — SENNA 8.6 MG PO TABS
1.0000 | ORAL_TABLET | Freq: Two times a day (BID) | ORAL | Status: DC
  Administered 2023-03-30 – 2023-04-01 (×4): 8.6 mg via ORAL

## 2023-03-30 MED ORDER — PROPOFOL 10 MG/ML IV BOLUS
INTRAVENOUS | Status: DC | PRN
Start: 2023-03-30 — End: 2023-03-30
  Administered 2023-03-30: 50 mg via INTRAVENOUS
  Administered 2023-03-30: 200 mg via INTRAVENOUS
  Administered 2023-03-30: 100 ug/kg/min via INTRAVENOUS

## 2023-03-30 MED ORDER — ONDANSETRON HCL 4 MG/2ML IJ SOLN
INTRAMUSCULAR | Status: DC | PRN
  Administered 2023-03-30: 4 mg via INTRAVENOUS

## 2023-03-30 MED ORDER — DEXAMETHASONE SODIUM PHOSPHATE 10 MG/ML IJ SOLN
INTRAMUSCULAR | Status: DC | PRN
  Administered 2023-03-30: 10 mg via INTRAVENOUS

## 2023-03-30 MED ORDER — DEXMEDETOMIDINE HCL IN NACL 80 MCG/20ML IV SOLN
INTRAVENOUS | Status: AC
  Filled 2023-03-30: qty 20

## 2023-03-30 MED ORDER — SUCCINYLCHOLINE CHLORIDE 200 MG/10ML IV SOSY
PREFILLED_SYRINGE | INTRAVENOUS | Status: DC | PRN
  Administered 2023-03-30: 120 mg via INTRAVENOUS

## 2023-03-30 MED ORDER — MAGNESIUM CITRATE PO SOLN: 1.0000 | Freq: Once | ORAL | Status: DC | PRN

## 2023-03-30 MED ORDER — SODIUM CHLORIDE 0.9 % IV SOLN: INTRAVENOUS | Status: DC

## 2023-03-30 MED ORDER — OXYCODONE HCL 5 MG PO TABS: 10.0000 mg | ORAL_TABLET | ORAL | Status: DC | PRN

## 2023-03-30 MED ORDER — ENOXAPARIN SODIUM 40 MG/0.4ML IJ SOSY
40.0000 mg | PREFILLED_SYRINGE | INTRAMUSCULAR | Status: DC
  Administered 2023-03-31 – 2023-04-01 (×2): 40 mg via SUBCUTANEOUS

## 2023-03-30 MED ORDER — BUPIVACAINE LIPOSOME 1.3 % IJ SUSP
INTRAMUSCULAR | Status: AC
  Filled 2023-03-30: qty 20

## 2023-03-30 MED ORDER — GLYCOPYRROLATE 0.2 MG/ML IJ SOLN
INTRAMUSCULAR | Status: AC
  Filled 2023-03-30: qty 1

## 2023-03-30 SURGICAL SUPPLY — 57 items
ADH SKN CLS APL DERMABOND .7 (GAUZE/BANDAGES/DRESSINGS) ×2
AGENT HMST KT MTR STRL THRMB (HEMOSTASIS) ×1
ALLOGRAFT BONESTRIP KORE 2.5X5 (Bone Implant) IMPLANT
BASIN KIT SINGLE STR (MISCELLANEOUS) ×1 IMPLANT
CORD LIGHT LATERIAL X LIFT (MISCELLANEOUS) IMPLANT
COVERAGE SUPPORT SPINE BRAINLB (MISCELLANEOUS) ×1
DERMABOND ADVANCED .7 DNX12 (GAUZE/BANDAGES/DRESSINGS) ×1 IMPLANT
DRAPE C-ARMOR (DRAPES) IMPLANT
DRAPE LAPAROTOMY 100X77 ABD (DRAPES) ×1 IMPLANT
DRAPE SCAN PATIENT (DRAPES) ×1 IMPLANT
DRSG OPSITE POSTOP 4X6 (GAUZE/BANDAGES/DRESSINGS) IMPLANT
DRSG OPSITE POSTOP 4X8 (GAUZE/BANDAGES/DRESSINGS) IMPLANT
ELECT REM PT RETURN 9FT ADLT (ELECTROSURGICAL) ×2
ELECTRODE REM PT RTRN 9FT ADLT (ELECTROSURGICAL) ×1 IMPLANT
EX-PIN ORTHOLOCK NAV 4X150 (PIN) IMPLANT
FEE CVG SUPP BRAINLAB NG SPNE (MISCELLANEOUS) IMPLANT
FORCEPS BPLR BAYO 10IN 1.0TIP (ORTHOPEDIC DISPOSABLE SUPPLIES) IMPLANT
GAUZE 4X4 16PLY ~~LOC~~+RFID DBL (SPONGE) IMPLANT
GLOVE BIOGEL PI IND STRL 6.5 (GLOVE) ×1 IMPLANT
GLOVE SURG SYN 6.5 ES PF (GLOVE) ×2
GLOVE SURG SYN 6.5 PF PI (GLOVE) ×2 IMPLANT
GLOVE SURG SYN 8.5 E (GLOVE) ×7
GLOVE SURG SYN 8.5 PF PI (GLOVE) ×3 IMPLANT
GOWN SRG LRG LVL 4 IMPRV REINF (GOWNS) ×1 IMPLANT
GOWN SRG XL LVL 3 NONREINFORCE (GOWNS) ×1 IMPLANT
GOWN STRL NON-REIN TWL XL LVL3 (GOWNS) ×2
GOWN STRL REIN LRG LVL4 (GOWNS) ×2
GUIDEWIRE NITINOL BEVEL TIP (WIRE) IMPLANT
HOLDER FOLEY CATH W/STRAP (MISCELLANEOUS) ×1 IMPLANT
JET LAVAGE IRRISEPT WOUND (IRRIGATION / IRRIGATOR) ×1
KIT DILATOR XLIF 5 (KITS) IMPLANT
KIT DISP MARS 3V (KITS) IMPLANT
KIT SPINAL PRONEVIEW (KITS) ×1 IMPLANT
KNIFE BOYONETTED ANNULOTOMY (MISCELLANEOUS) IMPLANT
LAVAGE JET IRRISEPT WOUND (IRRIGATION / IRRIGATOR) ×1 IMPLANT
MANIFOLD NEPTUNE II (INSTRUMENTS) ×1 IMPLANT
MARKER SKIN DUAL TIP RULER LAB (MISCELLANEOUS) ×1 IMPLANT
MARKER SPHERE PSV REFLC 13MM (MARKER) ×7 IMPLANT
MODULE NVM5 NEXT GEN EMG (NEUROSURGERY SUPPLIES) IMPLANT
NDL SAFETY ECLIP 18X1.5 (MISCELLANEOUS) ×1 IMPLANT
NS IRRIG 1000ML POUR BTL (IV SOLUTION) ×1 IMPLANT
PACK LAMINECTOMY ARMC (PACKS) ×1 IMPLANT
ROD RELINE MAS LORD 5.5X85MM (Rod) IMPLANT
ROD SPINAL 5.5X80 TI LORDOSE (Rod) IMPLANT
SCREW LOCK RELINE 5.5 TULIP (Screw) IMPLANT
SCREW RED RELINE 7.5X45MM POLY (Screw) IMPLANT
SCREW RELINE RED 6.5X45MM POLY (Screw) IMPLANT
SPACER HEDRON 10D 18X50X13 (Spacer) IMPLANT
SURGIFLO W/THROMBIN 8M KIT (HEMOSTASIS) ×1 IMPLANT
SUT DVC VLOC 3-0 CL 6 P-12 (SUTURE) ×1 IMPLANT
SUT VIC AB 0 CT1 27 (SUTURE) ×2
SUT VIC AB 0 CT1 27XCR 8 STRN (SUTURE) ×2 IMPLANT
SUT VIC AB 2-0 CT1 18 (SUTURE) ×2 IMPLANT
SYR 10ML LL (SYRINGE) ×1 IMPLANT
SYR 30ML LL (SYRINGE) ×2 IMPLANT
TRAP FLUID SMOKE EVACUATOR (MISCELLANEOUS) ×1 IMPLANT
WATER STERILE IRR 1000ML POUR (IV SOLUTION) ×2 IMPLANT

## 2023-03-30 NOTE — Anesthesia Preprocedure Evaluation (Signed)
Anesthesia Evaluation  Patient identified by MRN, date of birth, ID band Patient awake    Reviewed: Allergy & Precautions, NPO status , Patient's Chart, lab work & pertinent test results  History of Anesthesia Complications Negative for: history of anesthetic complications  Airway Mallampati: II  TM Distance: >3 FB Neck ROM: Full    Dental  (+) Chipped   Pulmonary neg pulmonary ROS, neg sleep apnea, neg COPD, Patient abstained from smoking.Not current smoker, former smoker   Pulmonary exam normal breath sounds clear to auscultation       Cardiovascular Exercise Tolerance: Good METShypertension, Pt. on medications (-) CAD and (-) Past MI (-) dysrhythmias  Rhythm:Regular Rate:Normal - Systolic murmurs    Neuro/Psych  PSYCHIATRIC DISORDERS Anxiety   Schizophrenia  negative neurological ROS     GI/Hepatic ,GERD  Controlled,,(+)     (-) substance abuse    Endo/Other  neg diabetes    Renal/GU negative Renal ROS     Musculoskeletal  (+) Arthritis ,    Abdominal   Peds  Hematology   Anesthesia Other Findings Past Medical History: No date: Anxiety No date: Arthritis No date: GERD (gastroesophageal reflux disease)     Comment:  H/O No date: Hyperlipidemia No date: Hypertension No date: Schizophrenia (HCC)  Reproductive/Obstetrics                             Anesthesia Physical Anesthesia Plan  ASA: 2  Anesthesia Plan: General   Post-op Pain Management: Ofirmev IV (intra-op)*   Induction: Intravenous  PONV Risk Score and Plan: 2 and Ondansetron, Dexamethasone and Midazolam  Airway Management Planned: Oral ETT  Additional Equipment: None  Intra-op Plan:   Post-operative Plan: Extubation in OR  Informed Consent: I have reviewed the patients History and Physical, chart, labs and discussed the procedure including the risks, benefits and alternatives for the proposed anesthesia  with the patient or authorized representative who has indicated his/her understanding and acceptance.     Dental advisory given  Plan Discussed with: CRNA and Surgeon  Anesthesia Plan Comments: (Discussed risks of anesthesia with patient, including PONV, sore throat, lip/dental/eye damage. Rare risks discussed as well, such as cardiorespiratory and neurological sequelae, and allergic reactions. Discussed the role of CRNA in patient's perioperative care. Patient understands.)       Anesthesia Quick Evaluation

## 2023-03-30 NOTE — Anesthesia Procedure Notes (Signed)
Procedure Name: Intubation Date/Time: 03/30/2023 12:39 PM  Performed by: Morene Crocker, CRNAPre-anesthesia Checklist: Patient identified, Patient being monitored, Timeout performed, Emergency Drugs available and Suction available Patient Re-evaluated:Patient Re-evaluated prior to induction Oxygen Delivery Method: Circle system utilized Preoxygenation: Pre-oxygenation with 100% oxygen Induction Type: IV induction Ventilation: Mask ventilation without difficulty Laryngoscope Size: McGraph and 4 Grade View: Grade I Tube type: Oral Tube size: 7.0 mm Number of attempts: 1 Airway Equipment and Method: Stylet Placement Confirmation: ETT inserted through vocal cords under direct vision, positive ETCO2 and breath sounds checked- equal and bilateral Secured at: 23 cm Tube secured with: Tape Dental Injury: Teeth and Oropharynx as per pre-operative assessment  Comments: Smooth atraumatic intubation, no complications noted

## 2023-03-30 NOTE — Transfer of Care (Signed)
Immediate Anesthesia Transfer of Care Note  Patient: Justin Nixon  Procedure(s) Performed: L3-4 LATERAL LUMBAR INTERBODY FUSION L3-5 POSTERIOR SPINAL FUSION APPLICATION OF INTRAOPERATIVE CT SCAN  Patient Location: PACU  Anesthesia Type:General  Level of Consciousness: drowsy  Airway & Oxygen Therapy: Patient Spontanous Breathing and Patient connected to face mask oxygen  Post-op Assessment: Report given to RN and Post -op Vital signs reviewed and stable  Post vital signs: Reviewed and stable  Last Vitals:  Vitals Value Taken Time  BP 116/91 03/30/23 1537  Temp 36.3 C 03/30/23 1537  Pulse 70 03/30/23 1544  Resp 14 03/30/23 1544  SpO2 100 % 03/30/23 1544  Vitals shown include unfiled device data.  Last Pain:  Vitals:   03/30/23 1025  TempSrc: Oral  PainSc: 8          Complications: No notable events documented.

## 2023-03-30 NOTE — Op Note (Signed)
Indications: [name] is a 47 y.o. 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   Preoperative Note:   Risks of surgery discussed include: infection, bleeding, stroke, coma, death, paralysis, CSF leak, nerve/spinal cord injury, numbness, tingling, weakness, complex regional pain syndrome, recurrent stenosis and/or disc herniation, vascular injury, development of instability, neck/back pain, need for further surgery, persistent symptoms, development of deformity, and the risks of anesthesia. The patient understood these risks and agreed to proceed.  NAME OF ANTERIOR PROCEDURE:               1. Anterior lumbar interbody fusion via a right lateral retroperitoneal approach at L3/4 2. Placement of a Lordotic  Globus Hedron interbody cage, filled with Demineralized Bone Matrix  NAME OF POSTERIOR PROCEDURE 1. Posterior instrumentation using Nuvasive Reline Instrumentation (L3-5) 2. Posterolateral fusion, L3/4, utilizing demineralized bone matrix 3. Use of Stereotaxis   PROCEDURE:  Patient was brought to the operating room, intubated, turned to the lateral position.  All pressure points were checked and double-checked.  The patient was prepped and draped in the standard fashion. Prior to prepping, fluoroscopy was brought in and the patient was positioned with a large bump under the  contralateral side between the iliac crest and rib cage, allowing the area between the iliac crest and the lateral aspect of the rib cage to open and increase the ability to reach inferiorly, to facilitate entry into the disc space.  The incision was marked upon the skin both the location of the disc space as well as the superior most aspect of the iliac crest.  Based on the identification of the disc space an incision was prepared, marked upon the skin and eventually was used for our lateral incision.  The fluoroscopy was turned into a cross table A/P image in order to confirm that the patient's spine remained in a perpendicular trajectory to the floor without rotation.  Once confirming that all the pressure points were checked and double-checked and the patient remained in sturdy position strapped down in this slightly jack-knifed lateral position, the patient was prepped and draped in standard fashion.  The skin was injected with local anesthetic, then incised until the abdominal wall fascia was noted.  I bluntly dissected posteriorly until we were able to identify the posterior musculature near petit's triangle.  At this point, using primarily blunt dissection with our finger aided with a metzenbaum scissor, were able to enter the retroperitoneal cavity.  The retroperitoneal potential space was opened further until palpating out the psoas muscle, the medial aspect of the iliac crest, the medial aspect of the last rib and continued to define the retroperitoneal space with blunt dissection in order to facilitate safe placement of our dilators.    While protecting by dissecting directly onto a finger in the retroperitoneum, the retroperitoneal space was entered safely from the lateral incision and the initial dilator placed onto the muscle belly of the psoas.  While directly stimulating the dilator  and after radiographically confirming our location relative to the disc space, I placed the dilator through the  psoas.  The dilators were stimulated to ensure remaining safely away from any of the lumbar plexus nerves; the dilators were repositioned until no pathologic stimulation was appreciated.  Once I had confirmed the location of our initial dilator radiographically, a K-wire secured the dilator into the L3/4 disc space and confirmed position under A/P and lateral fluoroscopy.  At this point, I dilated up with direct stimulation to confirm lack of pathologic stimulation.  Once all the dilators were in position, I placed in the retractor and secured it onto the table, locked into position and confirmed under A/P and lateral fluoroscopy to confirm our approach angle to the disc space as well as location relative to the disc space.  I then placed the muscle stimulator in through the working channel down to the vertebral body, stimulating the entire lateral surface of the vertebral body and any of the visualized psoas muscle that was adjacent to the retractor, confirming again the safe passage to the psoas before we began performing the discectomy.  At this point, we began our discectomy at L3/4.  The disc was incised laterally throughout the extent of our exposure. Using a combination of pituitary rongeurs, Kerrison rongeurs, rasps, curettes of various sorts, we were able to begin to clean out the disc space.  Once we had cleaned out the majority of the disc space, we then cut the lateral annulus with a cob, breaking the lateral annual attachments on the contralateral side by subtly working the cob through the annulus while using flouroscopy.  Care was taken not to extend further than required after cutting the annular attachments.  After this had been performed, we prepared the endplates for placement of our graft, sized a graft to the disc space by serially dilating up in trial sizes until we confirmed that our graft would be well positioned, allowing distraction while maintaining good grip.  This was confirmed under  A/P and lateral fluoroscopy in order to ensure its placement as an eventual trial for placement of our final graft.  We irrigated with saline.  Once confirmed placement, the Hedron implant filled with allograft was impacted into position at L3/4.   Through a combination of intradiscal distraction and anterior releasing, we were able to correct the anterior deformity during disc preparation and placement of the graft.  At this point, final radiographs were performed, and we began closure.  The wound was closed using 0 Vicryl interrupted suture in the fascia and 2-0 Vicryl inverted suture were placed in the subcutaneous tissue and dermis. 3-0 monocryl was used for final closure. Dermabond was used to close the skin.    After closing the anterior part in layers, the patient was repositioned into prone position.  All pressure points were checked and double-checked.  The posterior operative site was prepped and draped in standard fashion.  The stereotactic array was placed.  Stereotactic images were acquired using intraoperative CT scanning.  This was registered to the patient.  Using stereotaxis, screw trajectories were planned and incisions made.  The L3 pedicles were cannulated bilaterally and K wires used to secure the tracks.  We then utilized a stereotactic screwdriver to place pedicle screws at L3.  The prior implants were identified, removed, and upsized (at L4 and L5).  At each level, Nuvasive Reline pedicle screws were placed.  Once the screws were placed, the screw extensions were then linked, a path was  formed for the rod and a rod was utilized to connect the screws from L3-5.  We then compressed, torqued / counter-torqued and removed the screw assembly. Once performed on each side, the C-arm was brought back and to take confirmatory CT scan showing appropriate placement of all instrumentation and anatomic alignment.    Posterolateral arthrodesis was performed at L3-L4 utilizing demineralized  bone matrix.  We irrigated each incision and obtained hemostasis. Each wound was closed using 0 Vicryl interrupted suture in the fascia, 2-0 Vicryl inverted suture were placed in the subcutaneous tissue and dermis. 3-0 monocryl was used for final closure. Dermabond was used to close the skin.    Needle, lap and all counts were correct at the end of the case.    Manning Charity PA assisted in the entire procedure. An assistant was required for this procedure due to the complexity.  The assistant provided assistance in tissue manipulation and suction, and was required for the successful and safe performance of the procedure. I performed the critical portions of the procedure.   Venetia Night MD Neurosurgery

## 2023-03-30 NOTE — H&P (Signed)
DOS: 11/18/21 (L4-5 XLIF/PSF)  HISTORY OF PRESENT ILLNESS: 03/30/2023 [name] presents with continued symptoms  02/17/2023 He returns to see me after finishing physical therapy.  This physical therapy made him worse.  He is still having a lot of back pain and leg pain.  He is not having leg pain in both legs.  01/27/2023 He had a left-sided L3-4 transforaminal epidural steroid injection which was very helpful during the anesthetic phase.  He continues to have left anterior thigh pain.  11/25/2022 [name] is status post the above surgery.  He is having some pain down his leg intermittently.  Overall, he is still better than it was prior to surgery.  History reviewed. No pertinent family history.  Social History   Tobacco Use   Smoking status: Former    Average packs/day: 0.5 packs/day for 22.0 years (11.0 ttl pk-yrs)    Types: Cigarettes    Start date: 02/2023    Quit date: 01/1994    Years since quitting: 29.2   Smokeless tobacco: Never  Vaping Use   Vaping status: Never Used  Substance Use Topics   Alcohol use: Not Currently    Comment: OCC   Drug use: No    Current Meds  Medication Sig   gabapentin (NEURONTIN) 300 MG capsule TAKE 1 CAPSULE(300 MG) BY MOUTH AT BEDTIME   lisinopril (ZESTRIL) 20 MG tablet Take by mouth.   lurasidone (LATUDA) 40 MG TABS tablet Take 40 mg by mouth daily after supper.   Multiple Vitamins-Minerals (MULTIVITAMIN WITH MINERALS) tablet Take 1 tablet by mouth daily.   ondansetron (ZOFRAN-ODT) 8 MG disintegrating tablet Take 1 tablet (8 mg total) by mouth every 8 (eight) hours as needed for nausea or vomiting.   QUEtiapine (SEROQUEL) 100 MG tablet Take 100 mg by mouth at bedtime.   Allergies  Allergen Reactions   Penicillins Itching    TOLERATED CEFAZOLIN 10/10/2020       PHYSICAL EXAMINATION:   Vitals:   03/30/23 1025  BP: (!) 142/108  Pulse: 62  Resp: 18  Temp: 98.3 F (36.8 C)  SpO2: 98%   Heart sounds normal no MRG.  Chest Clear to Auscultation Bilaterally.  General: Patient is well developed, well nourished, calm, collected, and in no apparent distress.  NEUROLOGICAL:  General: In no acute distress.  Awake, alert, oriented to person, place, and time. Pupils equal round and reactive to light.   Strength:  Side Iliopsoas Quads Hamstring PF DF EHL  R 5 5 5 5 5 5   L 5 5 5 5 5 5    Incision c/d/i   ROS (Neurologic): Negative except as noted above  IMAGING: No complications noted on November 2023 x-rays  MRI L spine 12/05/22 IMPRESSION: 1. New or larger left extraforaminal disc extrusion at L3-4 with potential left L3 nerve impingement. 2. New right subarticular disc extrusion at L2-3 with potential right L3 nerve root impingement. 3. Interval L4-5 fusion without stenosis.     Electronically Signed   By: Sebastian Ache M.D.   On: 12/05/2022 16:48   ASSESSMENT/PLAN:  Justin Nixon is doing fair after L4-5 fusion.  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  He is having bilateral back pain with bilateral sciatica.  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    He has tried and failed conservative management.  He tried physical therapy without improvement.  He has quit smoking.  We will proceed with L3-4 lateral  lumbar interbody fusion from a right-sided approach to address his bilateral concerns followed by extension of his posterior instrumentation and fusion.   Venetia Night MD, [facility] Department of Neurosurgery

## 2023-03-31 ENCOUNTER — Encounter: Payer: Self-pay | Admitting: Neurosurgery

## 2023-03-31 MED ORDER — OXYCODONE HCL 5 MG PO TABS
ORAL_TABLET | ORAL | Status: AC
  Filled 2023-03-31: qty 1

## 2023-03-31 MED ORDER — SENNA 8.6 MG PO TABS
ORAL_TABLET | ORAL | Status: AC
  Filled 2023-03-31: qty 1

## 2023-03-31 MED ORDER — KETOROLAC TROMETHAMINE 15 MG/ML IJ SOLN
INTRAMUSCULAR | Status: AC
  Filled 2023-03-31: qty 1

## 2023-03-31 MED ORDER — ENOXAPARIN SODIUM 40 MG/0.4ML IJ SOSY
PREFILLED_SYRINGE | INTRAMUSCULAR | Status: AC
  Filled 2023-03-31: qty 0.4

## 2023-03-31 MED ORDER — DOCUSATE SODIUM 100 MG PO CAPS
ORAL_CAPSULE | ORAL | Status: AC
  Filled 2023-03-31: qty 1

## 2023-03-31 MED ORDER — LISINOPRIL 20 MG PO TABS
ORAL_TABLET | ORAL | Status: AC
  Filled 2023-03-31: qty 1

## 2023-03-31 MED ORDER — ACETAMINOPHEN 500 MG PO TABS
ORAL_TABLET | ORAL | Status: AC
  Filled 2023-03-31: qty 2

## 2023-03-31 MED ORDER — ONDANSETRON HCL 4 MG/2ML IJ SOLN
INTRAMUSCULAR | Status: AC
  Filled 2023-03-31: qty 2

## 2023-03-31 MED ORDER — CALCIUM CARBONATE ANTACID 500 MG PO CHEW
1.0000 | CHEWABLE_TABLET | Freq: Three times a day (TID) | ORAL | Status: DC | PRN
  Administered 2023-03-31: 200 mg via ORAL
  Filled 2023-03-31 (×2): qty 1

## 2023-03-31 MED ORDER — GABAPENTIN 300 MG PO CAPS
ORAL_CAPSULE | ORAL | Status: AC
  Filled 2023-03-31: qty 1

## 2023-03-31 NOTE — Discharge Instructions (Signed)
  Your surgeon has performed an operation on your lumbar spine (low back) to relieve pressure on one or more nerves. Many times, patients feel better immediately after surgery and can "overdo it." Even if you feel well, it is important that you follow these activity guidelines. If you do not let your back heal properly from the surgery, you can increase the chance of hardware complications and/or return of your symptoms. The following are instructions to help in your recovery once you have been discharged from the hospital.  Do not use NSAIDs for 3 months after surgery.   Activity    No bending, lifting, or twisting ("BLT"). Avoid lifting objects heavier than 10 pounds (gallon milk jug).  Where possible, avoid household activities that involve lifting, bending, pushing, or pulling such as laundry, vacuuming, grocery shopping, and childcare. Try to arrange for help from friends and family for these activities while your back heals.  Increase physical activity slowly as tolerated.  Taking short walks is encouraged, but avoid strenuous exercise. Do not jog, run, bicycle, lift weights, or participate in any other exercises unless specifically allowed by your doctor. Avoid prolonged sitting, including car rides.  Talk to your doctor before resuming sexual activity.  You should not drive until cleared by your doctor.  Until released by your doctor, you should not return to work or school.  You should rest at home and let your body heal.   You may shower three days after your surgery.  After showering, lightly dab your incision dry. Do not take a tub bath or go swimming for 3 weeks, or until approved by your doctor at your follow-up appointment.  If you smoke, we strongly recommend that you quit.  Smoking has been proven to interfere with normal healing in your back and will dramatically reduce the success rate of your surgery. Please contact [facility] (800-QUIT-NOW) and use the resources at  www.[facility].com for assistance in stopping smoking.  Surgical Incision   If you have a dressing on your incision, you may remove it three days after your surgery. Keep your incision area clean and dry.  Your incision was closed with Dermabond glue. The glue should begin to peel away within about a week.  Diet            You may return to your usual diet. Be sure to stay hydrated.  When to Contact Us  Although your surgery and recovery will likely be uneventful, you may have some residual numbness, aches, and pains in your back and/or legs. This is normal and should improve in the next few weeks.  However, should you experience any of the following, contact us immediately: New numbness or weakness Pain that is progressively getting worse, and is not relieved by your pain medications or rest Bleeding, redness, swelling, pain, or drainage from surgical incision Chills or flu-like symptoms Fever greater than 101.0 F (38.3 C) Problems with bowel or bladder functions Difficulty breathing or shortness of breath Warmth, tenderness, or swelling in your calf  Contact Information During office hours (Monday-Friday 9 am to 5 pm), please call your physician at [phone] and ask for Kendelyn Jean After hours and weekends, please call [phone] and speak with the neurosurgeon on call For a life-threatening emergency, call 911 

## 2023-03-31 NOTE — Progress Notes (Signed)
Inpatient Rehab Admissions Coordinator:   Per PT recommendations pt was screened for CIR by Estill Dooms, PT, DPT.  Note admitted for elective XLIF L3-4 and PSF L3-5.  Neurosurgery documenting 5/5 strength on POD 1.  Nursing assessment notes continent bowel/bladder.  Given payor trends, [facility] Medicare will not approve this patient for CIR.  Recommend TOC f/u for alternative rehab venue if he does not improve with mobility.   Estill Dooms, PT, DPT Admissions Coordinator [phone] 03/31/23  12:01 PM

## 2023-03-31 NOTE — Progress Notes (Signed)
Pt has been c/o heartburn with hiccups with little to no relief after PRN Zofran given. Encouraged pt to get up from bed and ambulate this time. While doing so, RN noted that both dressings are saturated. The left back dressing is almost completely saturated, while the other has minimal on it and has been marked. Text paged Dr Myer Haff regarding above and he said, Justin Nixon will check on it during rounds and ordered PRN Tums for heartburn. Foley catheter removed, pt was able to ambulate about 22ft at the hallway with front wheel walker until pt became lightheaded and shaky. Pt was offered with chair and rolled back to his room. Vital signs taken, stable, pain medicine administered thereafter for 9/10 pain post ambulation. Will continue to monitor and attend to pt's needs.

## 2023-03-31 NOTE — Plan of Care (Signed)
  Problem: Education: Goal: Ability to verbalize activity precautions or restrictions will improve Outcome: Progressing Goal: Knowledge of the prescribed therapeutic regimen will improve Outcome: Progressing   Problem: Activity: Goal: Ability to tolerate increased activity will improve Outcome: Progressing Goal: Will remain free from falls Outcome: Progressing   Problem: Education: Goal: Ability to verbalize activity precautions or restrictions will improve Outcome: Progressing Goal: Knowledge of the prescribed therapeutic regimen will improve Outcome: Progressing   Problem: Activity: Goal: Ability to tolerate increased activity will improve Outcome: Progressing Goal: Will remain free from falls Outcome: Progressing

## 2023-03-31 NOTE — Evaluation (Signed)
Occupational Therapy Evaluation Patient Details Name: Justin Nixon MRN: [id number] DOB: 01/05/1976 Today's Date: 03/31/2023   History of Present Illness Justin Nixon is a 47yoM who comes to [facility] on 03/30/23 for elective lumbar spine neurosurgery (L3-4 LATERAL LUMBAR INTERBODY FUSION, L3-5 POSTERIOR SPINAL FUSION) after several months of persistent symptoms. MRI in May revealing of "New or  larger left extraforaminal disc extrusion at L3-4 with potential left L3 nerve impingement, New right subarticular disc extrusion at L2-3 with potential right L3 nerve root impingement." PMH: L4-5 XLIF/PSF on 11/18/21,  schizophrenia, GAD, GERD, HLD, and L elbow fx s/p ORIF 08/28/21.   Clinical Impression   Patient received for OT evaluation. See flowsheet below for details of function. Pt participated well in OT training on ADLs/IADLs s/p L 3-4 fusion. Ready for d/c home once cleared by surgical team and PT. Has deficits in mobility, but all OT training completed and pt states his wife will assist with ADLs at home.  Patient with no further need for OT in acute care; discharge OT services.        If plan is discharge home, recommend the following: A little help with walking and/or transfers;A little help with bathing/dressing/bathroom;Assistance with cooking/housework;Assist for transportation;Help with stairs or ramp for entrance    Functional Status Assessment  Patient has had a recent decline in their functional status and demonstrates the ability to make significant improvements in function in a reasonable and predictable amount of time.  Equipment Recommendations  Other (comment) (long-handled sponge; pt stated he will purchase on [employer])    Recommendations for Other Services       Precautions / Restrictions Precautions Precautions: Fall;Back Precaution Booklet Issued: No Precaution Comments: known from prior procedure; no cues needed Restrictions Weight Bearing Restrictions: No       Mobility Bed Mobility               General bed mobility comments: pt in recliner before and after    Transfers Overall transfer level: Needs assistance Equipment used: Rolling walker (2 wheels) Transfers: Sit to/from Stand Sit to Stand: Contact guard assist, Min assist           General transfer comment: Cues for hand placement.      Balance Overall balance assessment: History of Falls, Needs assistance Sitting-balance support: Feet supported Sitting balance-Leahy Scale: Good     Standing balance support: Bilateral upper extremity supported Standing balance-Leahy Scale: Fair Standing balance comment: heavy reliance on walker for support                           ADL either performed or assessed with clinical judgement   ADL Overall ADL's : Needs assistance/impaired                     Lower Body Dressing: Minimal assistance Lower Body Dressing Details (indicate cue type and reason): discussed sock aid as a tool that could help him; pt also states that wife can assist. Pt unable to make figure four position   Toilet Transfer Details (indicate cue type and reason): simulated; pt able to t/f to/from recliner with CGA         Functional mobility during ADLs: Contact guard assist;Rolling walker (2 wheels) General ADL Comments: Pt states his wife will be home to assist with ADLs as needed; feels confident with ADL performance since he had similar surgery last year. OT discussed ADLs in context of back precautions. Recommended  long-handeld sponge for showering (seated).     Vision Baseline Vision/History: 1 Wears glasses Patient Visual Report: No change from baseline       Perception         Praxis         Pertinent Vitals/Pain Pain Assessment Pain Assessment: 0-10 Pain Score: 5  Pain Location: back Pain Descriptors / Indicators: Sore, Aching Pain Intervention(s): Limited activity within patient's tolerance, Monitored during  session     Extremity/Trunk Assessment Upper Extremity Assessment Upper Extremity Assessment: Overall WFL for tasks assessed   Lower Extremity Assessment Lower Extremity Assessment: Defer to PT evaluation   Cervical / Trunk Assessment Cervical / Trunk Assessment: Normal   Communication Communication Communication: No apparent difficulties Cueing Techniques: Verbal cues   Cognition Arousal: Alert Behavior During Therapy: WFL for tasks assessed/performed Overall Cognitive Status: Within Functional Limits for tasks assessed                                 General Comments: Pleasant and motivated     General Comments  Able to walk in hallway x approx 60-90 seconds, then needing to rest, feeling as if RLE will give out. Very reliant on RW.    Exercises     Shoulder Instructions      Home Living Family/patient expects to be discharged to:: Private residence Living Arrangements: Spouse/significant other Available Help at Discharge: Family (wife working during the day; mother can also assist) Type of Home: Mobile home Home Access: Stairs to enter Secretary/administrator of Steps: 6 Entrance Stairs-Rails: Right;Left;Can reach both Home Layout: One level     Bathroom Shower/Tub: Chief Strategy Officer: Standard (has BSC over toilet) Bathroom Accessibility: Yes How Accessible: Accessible via walker Home Equipment: Rolling Walker (2 wheels);Shower seat;Cane - quad;BSC/3in1;Hand held shower head;Adaptive equipment Adaptive Equipment: Reacher        Prior Functioning/Environment Prior Level of Function : Independent/Modified Independent;History of Falls (last six months);Working/employed;Driving             Mobility Comments: Pt was mobilizing with cane at baseline but recently RW. Endorses one fall. 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 ADLs Comments: (I) ADL/IADL; sleeps in recliner. using RW recently.        OT Problem  List:        OT Treatment/Interventions:      OT Goals(Current goals can be found in the care plan section) Acute Rehab OT Goals Patient Stated Goal: Go home OT Goal Formulation: All assessment and education complete, DC therapy  OT Frequency:      Co-evaluation   Reason for Co-Treatment: For patient/therapist safety PT goals addressed during session: Mobility/safety with mobility OT goals addressed during session:  (eval)      AM-PAC OT "6 Clicks" Daily Activity     Outcome Measure Help from another person eating meals?: None Help from another person taking care of personal grooming?: A Little Help from another person toileting, which includes using toliet, bedpan, or urinal?: None Help from another person bathing (including washing, rinsing, drying)?: A Little Help from another person to put on and taking off regular upper body clothing?: None Help from another person to put on and taking off regular lower body clothing?: A Little 6 Click Score: 21   End of Session Equipment Utilized During Treatment: Gait belt;Rolling walker (2 wheels) Nurse Communication: Mobility status  Activity Tolerance: Patient tolerated treatment well  Patient left: in chair;with call bell/phone within reach                   Time: 1251-1313 OT Time Calculation (min): 22 min Charges:  OT General Charges $OT Visit: 1 Visit OT Evaluation $OT Eval Moderate Complexity: 1 Mod  Namiah Dunnavant Junie Panning, MS, OTR/L  Alvester Morin 03/31/2023, 4:38 PM

## 2023-03-31 NOTE — Progress Notes (Signed)
   Neurosurgery Progress Note  History: Justin Nixon is s/p L3-4 XLIF L3-5 PSF.  POD1: Pt having some right groin pain this morning.  Worked with PT but had some trouble with his legs buckling when doing stairs.  He feels his pain medication is controlling his pain well.  Physical Exam: Vitals:   03/31/23 0458 03/31/23 0703  BP: (!) 136/91 110/81  Pulse: 98 79  Resp: 16 16  Temp: 98.1 F (36.7 C) 97.8 F (36.6 C)  SpO2: 96% 97%    AA Ox3 CNI  Strength:5/5 throughout BLE  Incisions covered with saturated dressings.  Dressings changed.  There is no significant active bleeding noted.  Data:  Other tests/results: none   Assessment/Plan:  Justin Nixon is a 47 y.o male presenting with bilateral sciatica and low back pain s/p L3-4 XLIF L3-5 PSF.  - mobilize - pain control - DVT prophylaxis - PTOT; pt set up with [facility] pre-op  Manning Charity PA-C Department of Neurosurgery

## 2023-03-31 NOTE — Evaluation (Signed)
Physical Therapy Evaluation Patient Details Name: Justin Nixon MRN: [id number] DOB: 05-09-1976 Today's Date: 03/31/2023  History of Present Illness  Justin Nixon is a 47yoM who comes to [facility] on 03/30/23 for elective lumbar spine neurosurgery (L3-4 LATERAL LUMBAR INTERBODY FUSION, L3-5 POSTERIOR SPINAL FUSION) after several months of persistent symptoms. MRI in May revealing of "New or  larger left extraforaminal disc extrusion at L3-4 with potential left L3 nerve impingement, New right subarticular disc extrusion at L2-3 with potential right L3 nerve root impingement." PMH: L4-5 XLIF/PSF on 11/18/21,  schizophrenia, GAD, GERD, HLD, and L elbow fx s/p ORIF 08/28/21.  Clinical Impression  Pt in bed on entry, breakfast completed. Pain at 5/10 with analgesia in place, pt reports ready to begin PT assessment. No cues for technique to EOB, no assist needed. BLE significantly weak, moreso than typical pain inhibition from surgery, struggles to rise from EOB unless elevated and heavy BUE assist. Pt AMB to stairs with 1 epidsode of buckling, then has >4 during and after stairs training which require maxA from author to prevent collapse to floor. Pt able to make it back to bay, then assisted to recliner. Pt reports falls history PTA, however, was much stronger in legs prior to today. Pt has 6 entry stairs, unsafe to attempt with author today. Will continue to follow.       If plan is discharge home, recommend the following: A lot of help with walking and/or transfers;Assist for transportation;Help with stairs or ramp for entrance   Can travel by private vehicle        Equipment Recommendations None recommended by PT (may need a WC if mobility does now improve.)  Recommendations for Other Services       Functional Status Assessment Patient has had a recent decline in their functional status and demonstrates the ability to make significant improvements in function in a reasonable and predictable amount of  time.     Precautions / Restrictions Precautions Precautions: Fall;Back Precaution Booklet Issued: No Precaution Comments: known from prior procedure; no cues needed Restrictions Weight Bearing Restrictions: No      Mobility  Bed Mobility Overal bed mobility: Modified Independent             General bed mobility comments: classic log roll from partially reclined bed.    Transfers Overall transfer level: Needs assistance Equipment used: Rolling walker (2 wheels) Transfers: Sit to/from Stand Sit to Stand: Min assist           General transfer comment: buckling/asterixis weakness of legts, slow force production, appropriately rigid trunk with consequent hip hinging    Ambulation/Gait Ambulation/Gait assistance: Mod assist, Contact guard assist Gait Distance (Feet): 70 Feet Assistive device: Rolling walker (2 wheels) Gait Pattern/deviations: Knees buckling       General Gait Details: buckling x1 enroute to stairs, self managed with RW, then 2-3 buckling episodes on return journey that require modA for arresting fall.  Stairs Stairs: Yes Stairs assistance: Max assist (modA to rise, maxA to arrest fall during bilat knee buckle) Stair Management: Two rails, Forwards, Step to pattern Number of Stairs: 4 General stair comments: 2 up, 2 down; has precarious weakness, buckling, asterixis with both leg attempts (concentrically only)  Wheelchair Mobility     Tilt Bed    Modified Rankin (Stroke Patients Only)       Balance Overall balance assessment: History of Falls  Pertinent Vitals/Pain      Home Living Family/patient expects to be discharged to:: Private residence Living Arrangements: Spouse/significant other Available Help at Discharge: Family (wife works [employer] day, mom helping as well) Type of Home: Mobile home Home Access: Stairs to enter Entrance Stairs-Rails: Right;Left;Can reach  both Entrance Stairs-Number of Steps: 6   Home Layout: One level Home Equipment: Agricultural consultant (2 wheels);Shower seat;Cane - quad;BSC/3in1;Hand held shower head      Prior Function                       Extremity/Trunk Assessment                Communication      Cognition Arousal: Alert Behavior During Therapy: WFL for tasks assessed/performed Overall Cognitive Status: Within Functional Limits for tasks assessed                                          General Comments      Exercises     Assessment/Plan    PT Assessment Patient needs continued PT services  PT Problem List Decreased strength;Decreased range of motion;Decreased activity tolerance;Decreased balance;Decreased mobility       PT Treatment Interventions DME instruction;Gait training;Stair training;Therapeutic exercise;Functional mobility training;Balance training;Neuromuscular re-education;Patient/family education    PT Goals (Current goals can be found in the Care Plan section)  Acute Rehab PT Goals Patient Stated Goal: return to home with assist from mother/wife PT Goal Formulation: With patient Time For Goal Achievement: 04/14/23 Potential to Achieve Goals: Good    Frequency BID     Co-evaluation               AM-PAC PT "6 Clicks" Mobility  Outcome Measure Help needed turning from your back to your side while in a flat bed without using bedrails?: A Little Help needed moving from lying on your back to sitting on the side of a flat bed without using bedrails?: A Little Help needed moving to and from a bed to a chair (including a wheelchair)?: A Lot Help needed standing up from a chair using your arms (e.g., wheelchair or bedside chair)?: A Lot Help needed to walk in hospital room?: A Lot Help needed climbing 3-5 steps with a railing? : A Lot 6 Click Score: 14    End of Session Equipment Utilized During Treatment: Gait belt Activity Tolerance: Patient  tolerated treatment well;Patient limited by fatigue Patient left: in chair;with call bell/phone within reach Nurse Communication: Mobility status PT Visit Diagnosis: Unsteadiness on feet (R26.81);Difficulty in walking, not elsewhere classified (R26.2)    Time: 6045-4098 PT Time Calculation (min) (ACUTE ONLY): 22 min   Charges:   PT Evaluation $PT Eval High Complexity: 1 High PT Treatments $Gait Training: 8-22 mins PT General Charges $$ ACUTE PT VISIT: 1 Visit        10:11 AM, 03/31/23 Rosamaria Lints, PT, DPT Physical Therapist - [facility]  [phone] (ASCOM)    Justin Nixon C 03/31/2023, 10:08 AM

## 2023-03-31 NOTE — Anesthesia Postprocedure Evaluation (Signed)
Anesthesia Post Note  Patient: Justin Nixon  Procedure(s) Performed: L3-4 LATERAL LUMBAR INTERBODY FUSION L3-5 POSTERIOR SPINAL FUSION APPLICATION OF INTRAOPERATIVE CT SCAN  Patient location during evaluation: PACU Anesthesia Type: General Level of consciousness: awake and alert Pain management: pain level controlled Vital Signs Assessment: post-procedure vital signs reviewed and stable Respiratory status: spontaneous breathing, nonlabored ventilation, respiratory function stable and patient connected to nasal cannula oxygen Cardiovascular status: blood pressure returned to baseline and stable Postop Assessment: no apparent nausea or vomiting Anesthetic complications: no   No notable events documented.   Last Vitals:  Vitals:   03/31/23 0458 03/31/23 0703  BP: (!) 136/91 110/81  Pulse: 98 79  Resp: 16 16  Temp: 36.7 C 36.6 C  SpO2: 96% 97%    Last Pain:  Vitals:   03/31/23 0755  TempSrc:   PainSc: 4                  Corinda Gubler

## 2023-03-31 NOTE — Progress Notes (Cosign Needed)
Physical Therapy Treatment Patient Details Name: Justin Nixon MRN: [id number] DOB: 07/20/1976 Today's Date: 03/31/2023   History of Present Illness Justin Nixon is a 47yoM who comes to [facility] on 03/30/23 for elective lumbar spine neurosurgery (L3-4 LATERAL LUMBAR INTERBODY FUSION, L3-5 POSTERIOR SPINAL FUSION) after several months of persistent symptoms. MRI in May revealing of "New or  larger left extraforaminal disc extrusion at L3-4 with potential left L3 nerve impingement, New right subarticular disc extrusion at L2-3 with potential right L3 nerve root impingement." PMH: L4-5 XLIF/PSF on 11/18/21,  schizophrenia, GAD, GERD, HLD, and L elbow fx s/p ORIF 08/28/21.    PT Comments  Pt in chair, ready for session.  Seen with OT during eval for +2 assist due to difficulty this AM for pt and staff safety. Pt needs cues for hand placements as he tries to pull up from walker and needs reminders during session.  He is able to stand with min a x 1 and CGA +1.  Marches in place with no buckling and is able to walk 50' to steps in PACU.  Takes seated rest in chair.  He has 2 rails at home and is able to step up/down 6 steps with CGA/Min a +2.  Overall does well with forward up/back down 2 step at a time for safety.  Some decreased quality as he fatigued but no buckling noted this pm.  Takes short seated rest before walking 120' then 54' back to room.  Gait is extended past room and upon return he stated he is fatigued and self initiates sitting in chair in hallway.   Overall improved this pm but gait remains weak.  One episode of some knee bucking but he is able to recover on his own with walker support.  AIR is not available to him per admission team so recommendations are changed to HHPT as he is showing overall improvement, has assistance from wife and feel comfortable with discharge home.  He does hope to stay another night before going home.  He has all equipment needed.  Suggested pulling up close to front door  as he stated he has a gravel path about 90' to either front or back door.  Recommended seated rest in chair before attempting step if he is unable to get closer with his car. He stated he has a gait belt at home and he is encouraged to use it with his wife especially on the stairs.  Would benefit from step training again tomorrow and is encouraged to walk to bathroom with nursing staff vs urinal to increase mobility here until discharge.   If plan is discharge home, recommend the following: A lot of help with walking and/or transfers;Assist for transportation;Help with stairs or ramp for entrance   Can travel by private vehicle        Equipment Recommendations  None recommended by PT (may need a WC if mobility does now improve.)    Recommendations for Other Services       Precautions / Restrictions Precautions Precautions: Fall;Back Precaution Booklet Issued: No Precaution Comments: known from prior procedure; no cues needed Restrictions Weight Bearing Restrictions: No     Mobility  Bed Mobility               General bed mobility comments: pt in recliner before and after Patient Response: Cooperative  Transfers Overall transfer level: Needs assistance Equipment used: Rolling walker (2 wheels) Transfers: Sit to/from Stand Sit to Stand: Min assist  General transfer comment: cues for hand placements and light assist to stand    Ambulation/Gait Ambulation/Gait assistance: Min assist, +2 safety/equipment Gait Distance (Feet): 120 Feet Assistive device: Rolling walker (2 wheels) Gait Pattern/deviations: Step-through pattern, Decreased step length - left, Decreased step length - right Gait velocity: decreased     General Gait Details: 45' 120' 40' with seated rest breaks due to fatigue and LE weakness   Stairs Stairs: Yes Stairs assistance: Min assist, Contact guard assist, +2 safety/equipment Stair Management: Two rails, Forwards, Step to pattern,  Backwards Number of Stairs: 6 General stair comments: up forward, down back one step only due to buckling this AM but overall does well.  some increased weakness noted towards end of steps but could have done 4 steps into home this afternoon   Wheelchair Mobility     Tilt Bed Tilt Bed Patient Response: Cooperative  Modified Rankin (Stroke Patients Only)       Balance Overall balance assessment: History of Falls, Needs assistance Sitting-balance support: Feet supported Sitting balance-Leahy Scale: Good     Standing balance support: Bilateral upper extremity supported Standing balance-Leahy Scale: Fair Standing balance comment: heavy reliance on walker for support                            Cognition Arousal: Alert Behavior During Therapy: WFL for tasks assessed/performed Overall Cognitive Status: Within Functional Limits for tasks assessed                                          Exercises      General Comments        Pertinent Vitals/Pain Pain Assessment Pain Assessment: 0-10 Pain Score: 5  Pain Location: back Pain Descriptors / Indicators: Sore, Aching Pain Intervention(s): Limited activity within patient's tolerance, Monitored during session, Repositioned    Home Living                          Prior Function            PT Goals (current goals can now be found in the care plan section) Acute Rehab PT Goals Patient Stated Goal: return to home with assist from mother/wife PT Goal Formulation: With patient Time For Goal Achievement: 04/14/23 Potential to Achieve Goals: Good Progress towards PT goals: Progressing toward goals    Frequency    BID      PT Plan      Co-evaluation PT/OT/SLP Co-Evaluation/Treatment: Yes Reason for Co-Treatment: For patient/therapist safety PT goals addressed during session: Mobility/safety with mobility OT goals addressed during session:  (eval)      AM-PAC PT "6 Clicks"  Mobility   Outcome Measure  Help needed turning from your back to your side while in a flat bed without using bedrails?: A Little Help needed moving from lying on your back to sitting on the side of a flat bed without using bedrails?: A Little Help needed moving to and from a bed to a chair (including a wheelchair)?: A Little Help needed standing up from a chair using your arms (e.g., wheelchair or bedside chair)?: A Little Help needed to walk in hospital room?: A Little Help needed climbing 3-5 steps with a railing? : A Lot 6 Click Score: 17    End of Session Equipment Utilized During Treatment: Gait belt  Activity Tolerance: Patient tolerated treatment well;Patient limited by fatigue Patient left: in chair;with call bell/phone within reach Nurse Communication: Mobility status PT Visit Diagnosis: Unsteadiness on feet (R26.81);Difficulty in walking, not elsewhere classified (R26.2)     Time: 1255-1316 PT Time Calculation (min) (ACUTE ONLY): 21 min  Charges:    $Gait Training: 8-22 mins PT General Charges $$ ACUTE PT VISIT: 1 Visit                   Danielle Dess, PTA 03/31/23, 1:33 PM

## 2023-04-01 LAB — HEMOGLOBIN: Hemoglobin: 11.6 g/dL — ABNORMAL LOW (ref 13.0–17.0)

## 2023-04-01 MED ORDER — SENNA 8.6 MG PO TABS: 1.0000 | ORAL_TABLET | Freq: Every day | ORAL | 0 refills | Status: DC | PRN

## 2023-04-01 MED ORDER — ENOXAPARIN SODIUM 40 MG/0.4ML IJ SOSY
PREFILLED_SYRINGE | INTRAMUSCULAR | Status: AC
  Filled 2023-04-01: qty 0.4

## 2023-04-01 MED ORDER — ACETAMINOPHEN 500 MG PO TABS
ORAL_TABLET | ORAL | Status: AC
  Filled 2023-04-01: qty 2

## 2023-04-01 MED ORDER — METHOCARBAMOL 500 MG PO TABS: 500.0000 mg | ORAL_TABLET | Freq: Four times a day (QID) | ORAL | 0 refills | Status: DC | PRN

## 2023-04-01 MED ORDER — OXYCODONE HCL 5 MG PO TABS
ORAL_TABLET | ORAL | Status: AC
  Filled 2023-04-01: qty 1

## 2023-04-01 MED ORDER — OXYCODONE HCL 5 MG PO TABS: 5.0000 mg | ORAL_TABLET | ORAL | 0 refills | Status: DC | PRN

## 2023-04-01 MED ORDER — SENNA 8.6 MG PO TABS
ORAL_TABLET | ORAL | Status: AC
  Filled 2023-04-01: qty 1

## 2023-04-01 MED ORDER — DOCUSATE SODIUM 100 MG PO CAPS
ORAL_CAPSULE | ORAL | Status: AC
  Filled 2023-04-01: qty 1

## 2023-04-01 NOTE — Plan of Care (Signed)
  Problem: Activity: Goal: Ability to avoid complications of mobility impairment will improve Outcome: Progressing   Problem: Pain Management: Goal: Pain level will decrease Outcome: Progressing   Problem: Skin Integrity: Goal: Will show signs of wound healing Outcome: Progressing

## 2023-04-01 NOTE — Discharge Summary (Signed)
Discharge Summary  Patient ID: Justin Nixon MRN: [id number] DOB/AGE: 1976-01-13 47 y.o.  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  His intraoperative course was uncomplicated.  He was admitted for therapy evaluation and pain control.  He experienced some bleeding from his incisions on postop day 1 which improved throughout his hospitalization.  He was seen by therapy and deemed appropriate for discharge home on postop day 2.  He was given oxycodone, Robaxin, and senna.  Consults: None  Significant Diagnostic Studies: none  Treatments: surgery: as above. Please see separately dictated operative report for further details.  Discharge Exam: Blood pressure 104/77, pulse 64, temperature 97.7 F (36.5 C), temperature source Oral, resp. rate 16, height 6' (1.829 m), weight 72.6 kg, SpO2 98%. AA Ox3 CNI   Strength:5/5 throughout BLE  Incisions covered with blood tinged dressings.   Disposition: Discharge disposition: 01-Home or Self Care       Discharge Instructions     Incentive spirometry RT   Complete by: As directed       Allergies as of 04/01/2023       Reactions   Penicillins Itching   TOLERATED CEFAZOLIN 10/10/2020        Medication List     TAKE these medications    gabapentin 300 MG capsule Commonly known as: NEURONTIN TAKE 1 CAPSULE(300 MG) BY  MOUTH AT BEDTIME   lisinopril 20 MG tablet Commonly known as: ZESTRIL Take by mouth.   lurasidone 40 MG Tabs tablet Commonly known as: LATUDA Take 40 mg by mouth daily after supper.   methocarbamol 500 MG tablet Commonly known as: ROBAXIN Take 1 tablet (500 mg total) by mouth every 6 (six) hours as needed for muscle spasms.   multivitamin with minerals tablet Take 1 tablet by mouth daily.   ondansetron 8 MG disintegrating tablet Commonly known as: ZOFRAN-ODT Take 1 tablet (8 mg total) by mouth every 8 (eight) hours as needed for nausea or vomiting.   oxyCODONE 5 MG immediate release tablet Commonly known as: Oxy IR/ROXICODONE Take 1 tablet (5 mg total) by mouth every 4 (four) hours as needed for moderate pain or severe pain ((score 4 to 6)).   QUEtiapine 100 MG tablet Commonly known as: SEROQUEL Take 100 mg by mouth at bedtime.   senna 8.6 MG Tabs tablet Commonly known as: SENOKOT Take 1 tablet (8.6 mg total) by mouth daily as needed for mild constipation.        Follow-up Information     Drake Leach, PA-C Follow up on 04/14/2023.   Specialty: Neurosurgery Contact information: [address] [place] [address] [phone]                 Signed: Susanne Borders 04/01/2023, 11:39 AM

## 2023-04-01 NOTE — Plan of Care (Signed)
  Problem: Activity: Goal: Ability to avoid complications of mobility impairment will improve Outcome: Progressing   Problem: Clinical Measurements: Goal: Postoperative complications will be avoided or minimized Outcome: Progressing   Problem: Health Behavior/Discharge Planning: Goal: Identification of resources available to assist in meeting health care needs will improve Outcome: Progressing   Problem: Bladder/Genitourinary: Goal: Urinary functional status for postoperative course will improve Outcome: Progressing

## 2023-04-01 NOTE — Progress Notes (Signed)
   Neurosurgery Progress Note  History: Justin Nixon is s/p L3-4 XLIF L3-5 PSF.  POD2: NAEO POD1: Pt having some right groin pain this morning.  Worked with PT but had some trouble with his legs buckling when doing stairs.  He feels his pain medication is controlling his pain well.  Physical Exam: Vitals:   04/01/23 0633 04/01/23 0728  BP: 109/77 104/77  Pulse: 73 64  Resp: 15 16  Temp: 98.4 F (36.9 C) 97.7 F (36.5 C)  SpO2: 97% 98%    AA Ox3 CNI  Strength:5/5 throughout BLE  Incisions covered with blood tinged dressings.  Data:  Other tests/results: none   Assessment/Plan:  Justin Nixon is a 47 y.o male presenting with bilateral sciatica and low back pain s/p L3-4 XLIF L3-5 PSF.  - mobilize - pain control - DVT prophylaxis - PTOT; pt set up with [facility] pre-op  Manning Charity PA-C Department of Neurosurgery

## 2023-04-01 NOTE — Progress Notes (Signed)
DISCHARGE NOTE: ? ?Pt given discharge instructions and verbalized understanding. Pt wheeled to car by staff. Wife providing transportation.  ?

## 2023-04-01 NOTE — Progress Notes (Signed)
Physical Therapy Treatment Patient Details Name: Justin Nixon MRN: [id number] DOB: 11-Jan-1976 Today's Date: 04/01/2023   History of Present Illness Justin Nixon is a 47yoM who comes to [facility] on 03/30/23 for elective lumbar spine neurosurgery (L3-4 LATERAL LUMBAR INTERBODY FUSION, L3-5 POSTERIOR SPINAL FUSION) after several months of persistent symptoms. MRI in May revealing of "New or  larger left extraforaminal disc extrusion at L3-4 with potential left L3 nerve impingement, New right subarticular disc extrusion at L2-3 with potential right L3 nerve root impingement." PMH: L4-5 XLIF/PSF on 11/18/21,  schizophrenia, GAD, GERD, HLD, and L elbow fx s/p ORIF 08/28/21.    PT Comments  Pt in chair on arrival, pain meds received. Pt reports several AMB opportunities since prior visit, reports success with confidence, safety. Pt asks to try stairs training again, performance appears better controlled, no giving way of knees as seen 24 hours prior. Pt performs twice as many stairs today. Pt requires a seated recovery before room, but is able to AMB 88ft twice without buckling episode. Pt endorses confidence in safe transition to home today.       If plan is discharge home, recommend the following: A lot of help with walking and/or transfers;Assist for transportation;Help with stairs or ramp for entrance   Can travel by private vehicle        Equipment Recommendations  None recommended by PT    Recommendations for Other Services       Precautions / Restrictions Precautions Precautions: Fall;Back Precaution Comments: known from prior procedure; no cues needed Restrictions Weight Bearing Restrictions: No     Mobility  Bed Mobility                    Transfers Overall transfer level: Needs assistance Equipment used: Rolling walker (2 wheels) Transfers: Sit to/from Stand Sit to Stand: Contact guard assist           General transfer comment: Slow, appears to require max effort, but  cannot r/o antalgic bradycardia.    Ambulation/Gait Ambulation/Gait assistance: Contact guard assist Gait Distance (Feet): 50 Feet Assistive device: Rolling walker (2 wheels) Gait Pattern/deviations: Step-to pattern, WFL(Within Functional Limits)       General Gait Details: asks for a sit break before return to bay; 55ft +90ft   Stairs Stairs: Yes Stairs assistance: Contact guard assist Stair Management: Two rails, Forwards, Step to pattern, Backwards Number of Stairs: 12 General stair comments: single step up used for safety; up + down 3x per leg.   Wheelchair Mobility     Tilt Bed    Modified Rankin (Stroke Patients Only)       Balance                                            Cognition                                                Exercises      General Comments        Pertinent Vitals/Pain Pain Assessment Pain Assessment: 0-10 Pain Score: 5  Pain Location: back Pain Descriptors / Indicators: Sore, Aching Pain Intervention(s): Limited activity within patient's tolerance, Premedicated before session    Home Living  Prior Function            PT Goals (current goals can now be found in the care plan section) Acute Rehab PT Goals Patient Stated Goal: return to home with assist from mother/wife PT Goal Formulation: With patient Time For Goal Achievement: 04/14/23 Potential to Achieve Goals: Good Progress towards PT goals: Progressing toward goals    Frequency    BID      PT Plan      Co-evaluation              AM-PAC PT "6 Clicks" Mobility   Outcome Measure  Help needed turning from your back to your side while in a flat bed without using bedrails?: A Little Help needed moving from lying on your back to sitting on the side of a flat bed without using bedrails?: A Little Help needed moving to and from a bed to a chair (including a wheelchair)?: A  Little Help needed standing up from a chair using your arms (e.g., wheelchair or bedside chair)?: A Little Help needed to walk in hospital room?: A Little Help needed climbing 3-5 steps with a railing? : A Lot 6 Click Score: 17    End of Session Equipment Utilized During Treatment: Gait belt Activity Tolerance: Patient tolerated treatment well;Patient limited by fatigue Patient left: in chair;with call bell/phone within reach Nurse Communication: Mobility status PT Visit Diagnosis: Unsteadiness on feet (R26.81);Difficulty in walking, not elsewhere classified (R26.2)     Time: 4098-1191 PT Time Calculation (min) (ACUTE ONLY): 16 min  Charges:    $Therapeutic Activity: 8-22 mins PT General Charges $$ ACUTE PT VISIT: 1 Visit                    11:29 AM, 04/01/23 Justin Nixon, PT, DPT Physical Therapist - [facility]  [phone] (ASCOM)    Justin Nixon 04/01/2023, 11:24 AM

## 2023-04-07 ENCOUNTER — Telehealth: Payer: Self-pay | Admitting: Neurosurgery

## 2023-04-07 MED ORDER — ONDANSETRON HCL 4 MG PO TABS: 4.0000 mg | ORAL_TABLET | Freq: Three times a day (TID) | ORAL | 0 refills | Status: DC | PRN

## 2023-04-07 NOTE — Telephone Encounter (Signed)
Can do limited refill of zofran. I sent in the 4mg  to take tid prn.   Make sure he is eating with the pain medication as it can cause nausea.   If nausea does not improve, then he should follow up with his PCP.

## 2023-04-07 NOTE — Telephone Encounter (Signed)
I spoke with Justin Nixon. He confirms that last night he was experiencing pain in his abdomen, right groin, and lower back. He confirms these symptoms were there before surgery, but he had a very difficult time getting comfortable last night. He has also been experiencing nausea, particularly last night and this morning.   He is taking the pain medicine with food. He is passing gas. His last bowel was this morning. He is also taking the methocarbamol. He takes gabapentin at night. At the time of our call, he feels his pain is under control and his main concern is nausea. He has not vomited.   He has some left over disintegrating ondansetron 8mg  (from an urgent care visit last year) and this helps. He has about 4 doses left.  [facility] [place]

## 2023-04-07 NOTE — Telephone Encounter (Signed)
L3-4 XLIF, L3-5 PSF on 03/30/23  Lower abdominal pain that runs from left to right. Right groin pain and lower back pain. He feels nauseous. The pain as kept him up all night. Is this normal? He is taking pain medication and muscle relaxer as directed. He has antinausea medication prescribed from last year that he is taking to help with nausea. His gait is limited when his right hip starts to tighten up.   Call the patient [phone]

## 2023-04-07 NOTE — Telephone Encounter (Signed)
I notified [name] of [name]'s response and recommendations. He verbalized understanding.

## 2023-04-09 ENCOUNTER — Encounter: Payer: Self-pay | Admitting: Neurosurgery

## 2023-04-10 ENCOUNTER — Telehealth: Payer: Self-pay | Admitting: Neurosurgery

## 2023-04-10 DIAGNOSIS — Z981 Arthrodesis status: Secondary | ICD-10-CM

## 2023-04-10 MED ORDER — OXYCODONE HCL 5 MG PO TABS: 5.0000 mg | ORAL_TABLET | ORAL | 0 refills | Status: DC | PRN

## 2023-04-10 NOTE — Addendum Note (Signed)
Addended byDrake Leach on: 04/10/2023 02:37 PM   Modules accepted: Orders

## 2023-04-10 NOTE — Telephone Encounter (Signed)
I spoke to Justin Nixon and told her it is ok to extend PT for patient. I also spoke to the patient and he states the right leg feels real tight and sometimes has a sharp pain. He is very uncomfortable. He is out of Oxycodone and has been taking the Gabapentin at night and the Methocarbamol. He states he has been taking Tylenol and it is not helping with the pain. He is willing to try a prednisone dose pack if needed. He states he will do anything if it will help with the pain. He is unable to sleep in his bed and is in the recliner most of the time.

## 2023-04-10 NOTE — Telephone Encounter (Signed)
PMP reviewed and is appropriate.   Oxycodone sent to his pharmacy.

## 2023-04-10 NOTE — Telephone Encounter (Signed)
L3-4 XLIF, L3-5 PSF on 03/30/23  Extend PT for 2 x 3 He still having a lot of issues with the right leg when he gets up and then after 20 feet his leg starts to buckle again.

## 2023-04-10 NOTE — Telephone Encounter (Signed)
Patient states he has not had any swelling, redness or pain in the calf. He states the Oxycodone was helping. He is will to get this medication again. He uses the [facility] in [place].

## 2023-04-10 NOTE — Progress Notes (Unsigned)
   REFERRING PHYSICIAN:  Gracelyn Nurse, Md [address] [place],  [place] [address]  DOS: 03/30/23 L3-4 XLIF and L3-5 posterior spinal fusion   HISTORY OF PRESENT ILLNESS: Justin Nixon is approximately 2 weeks status post L3-4 XLIF and L3-5 posterior spinal fusion. Was given oxycodone and robaxin on discharge from the hospital.   Called last week with increased right leg pain- he was out of oxycodone and this was refilled.   He has pain in right thigh and groin that is a little better. He has pain in his lower back as well.   He feels like he is slowly improving.    PHYSICAL EXAMINATION:  General: Patient is well developed, well nourished, calm, collected, and in no apparent distress.   NEUROLOGICAL:  General: In no acute distress.   Awake, alert, oriented to person, place, and time.  Pupils equal round and reactive to light.  Facial tone is symmetric.     Strength:            Side Iliopsoas Quads Hamstring PF DF EHL  R 4+ 5 5 5 5 5   L 5 5 5 5 5 5    He has some shakiness in bilateral arms/legs with strength testing.   He is shaking moving to standing position, but once he is up he ambulates well with a walker.   Incisions c/d/I, he notes some tenderness in right lower quadrant of abdomen. I don't appreciate any swelling.    ROS (Neurologic):  Negative except as noted above  IMAGING: Nothing new to review.   ASSESSMENT/PLAN:  Justin Nixon is doing reasonable s/p above surgery. Treatment options reviewed with patient and following plan made:   - I have advised the patient to lift up to 10 pounds until 6 weeks after surgery (follow up with Dr. Myer Haff).  - Reviewed wound care.  - No bending, twisting, or lifting.  - Continue with HHPT.  - Discussed that right leg pain/weakness should continue to improve.  - Continue on current medications including prn oxycodone and prn robaxin. No refills needed.   - He notes some swelling in right lower abdomen. He has  some mild tenderness, I don't appreciate any swelling. Reviewed with Dr. Myer Haff and he does not think this is related to the surgery.  He agrees with above plan as well. Patient to follow up with his PCP regarding this. He was sent a message.  - Follow up as scheduled in 4 weeks and prn.   Advised to contact the office if any questions or concerns arise.  Drake Leach PA-C Department of neurosurgery

## 2023-04-10 NOTE — Telephone Encounter (Signed)
I recommend he restart the oxycodone. Was pain under better control when he was taking this.   Right leg pain should improve with time.   Please make sure he does not have any calf pain or swelling in the calf.  Any redness? Is calf tender?   Let me know if he wants oxycodone sent to pharmacy.

## 2023-04-14 ENCOUNTER — Encounter: Payer: Self-pay | Admitting: Orthopedic Surgery

## 2023-04-14 ENCOUNTER — Ambulatory Visit (INDEPENDENT_AMBULATORY_CARE_PROVIDER_SITE_OTHER): Payer: Medicare Other | Admitting: Orthopedic Surgery

## 2023-04-14 VITALS — BP 130/76 | Temp 98.2°F | Ht 72.0 in | Wt 160.8 lb

## 2023-04-14 DIAGNOSIS — Z981 Arthrodesis status: Secondary | ICD-10-CM

## 2023-04-14 DIAGNOSIS — M5136 Other intervertebral disc degeneration, lumbar region: Secondary | ICD-10-CM

## 2023-04-14 DIAGNOSIS — Z09 Encounter for follow-up examination after completed treatment for conditions other than malignant neoplasm: Secondary | ICD-10-CM

## 2023-04-23 ENCOUNTER — Other Ambulatory Visit: Payer: Self-pay | Admitting: Orthopedic Surgery

## 2023-04-23 DIAGNOSIS — Z981 Arthrodesis status: Secondary | ICD-10-CM

## 2023-04-23 DIAGNOSIS — M5136 Other intervertebral disc degeneration, lumbar region: Secondary | ICD-10-CM

## 2023-04-23 MED ORDER — OXYCODONE HCL 5 MG PO TABS: 5.0000 mg | ORAL_TABLET | Freq: Four times a day (QID) | ORAL | 0 refills | Status: DC | PRN

## 2023-04-23 NOTE — Telephone Encounter (Signed)
DOS: 03/30/23 L3-4 XLIF and L3-5 posterior spinal fusion   Refill of oxycodone okay. PMP reviewed and is appropriate.   Please let him know the directions changed to 1 every 6 hours as needed for severe pain. Script sent to pharmacy.

## 2023-04-23 NOTE — Telephone Encounter (Signed)
Patient aware of medication refill.

## 2023-04-28 ENCOUNTER — Telehealth: Payer: Self-pay | Admitting: Neurosurgery

## 2023-04-28 DIAGNOSIS — Z981 Arthrodesis status: Secondary | ICD-10-CM

## 2023-04-28 NOTE — Telephone Encounter (Signed)
Irving Burton from [facility] called to advise that she plans to finish home health physical therapy on Thursday. The patient is needing more physical therapy, so they are requesting a referral to [facility] in [facility] for outpatient therapy. Emily's voicemail is confidential.   Irving Burton: [phone]

## 2023-04-28 NOTE — Telephone Encounter (Signed)
Okay to do outpatient PT. Write on the script his surgery and the date so they are aware.

## 2023-04-28 NOTE — Telephone Encounter (Signed)
Order placed for Physical therapy

## 2023-04-28 NOTE — Addendum Note (Signed)
Addended by: Felecia Jan on: 04/28/2023 01:34 PM   Modules accepted: Orders

## 2023-04-28 NOTE — Telephone Encounter (Signed)
Ok to put in referral for outpatient PT?

## 2023-05-08 ENCOUNTER — Other Ambulatory Visit: Payer: Self-pay | Admitting: Family Medicine

## 2023-05-08 DIAGNOSIS — M5416 Radiculopathy, lumbar region: Secondary | ICD-10-CM

## 2023-05-12 ENCOUNTER — Ambulatory Visit
Admission: RE | Admit: 2023-05-12 | Discharge: 2023-05-12 | Disposition: A | Discharge status: Home / Self Care | Payer: Medicare Other | Attending: Neurosurgery | Admitting: Neurosurgery

## 2023-05-12 ENCOUNTER — Encounter: Payer: Self-pay | Admitting: Neurosurgery

## 2023-05-12 ENCOUNTER — Ambulatory Visit
Admission: RE | Admit: 2023-05-12 | Discharge: 2023-05-12 | Disposition: A | Discharge status: Home / Self Care | Payer: Medicare Other | Source: Ambulatory Visit | Attending: Neurosurgery | Admitting: Neurosurgery

## 2023-05-12 ENCOUNTER — Ambulatory Visit (INDEPENDENT_AMBULATORY_CARE_PROVIDER_SITE_OTHER): Payer: Medicare Other | Admitting: Neurosurgery

## 2023-05-12 VITALS — BP 146/86 | Temp 98.6°F | Ht 72.0 in | Wt 160.0 lb

## 2023-05-12 DIAGNOSIS — M5416 Radiculopathy, lumbar region: Secondary | ICD-10-CM

## 2023-05-12 DIAGNOSIS — Z981 Arthrodesis status: Secondary | ICD-10-CM

## 2023-05-12 DIAGNOSIS — M51369 Other intervertebral disc degeneration, lumbar region without mention of lumbar back pain or lower extremity pain: Secondary | ICD-10-CM

## 2023-05-12 MED ORDER — CELECOXIB 200 MG PO CAPS: 200.0000 mg | ORAL_CAPSULE | Freq: Two times a day (BID) | ORAL | 0 refills | Status: AC

## 2023-05-12 NOTE — Progress Notes (Signed)
   REFERRING PHYSICIAN:  Gracelyn Nurse, Md [address] [place],  [place] [address]  DOS: 03/30/23 L3-4 XLIF and L3-5 posterior spinal fusion   HISTORY OF PRESENT ILLNESS: Justin Nixon is status post L3-4 XLIF and L3-5 posterior spinal fusion.   He is feeling better in his back, but still has some soreness.  He has pain in his right groin.  The pain down his leg is somewhat better.  He is just starting physical therapy.  He did start back smoking.   PHYSICAL EXAMINATION:  General: Patient is well developed, well nourished, calm, collected, and in no apparent distress.   NEUROLOGICAL:  General: In no acute distress.   Awake, alert, oriented to person, place, and time.  Pupils equal round and reactive to light.  Facial tone is symmetric.     Strength:            Side Iliopsoas Quads Hamstring PF DF EHL  R 4+ 5 5 5 5 5   L 5 5 5 5 5 5    He has positive Pearlean Brownie on the right   Incisions c/d/I.    ROS (Neurologic):  Negative except as noted above  IMAGING: No complications noted  ASSESSMENT/PLAN:  Justin Nixon is doing fair s/p above surgery.  I think he is doing somewhat better from his surgery, but he has some pain in his groin that could be from hip pathology.  With his next visit, would like to get x-rays of his hip in addition to his back.  If he is still having groin pain at that time, we we will refer for orthopedic evaluation.  I advised him to discontinue smoking.   Will see him back in clinic in 6 weeks.  I have refilled his Celebrex and will refill his methocarbamol when he needs.  Venetia Night MD Department of neurosurgery

## 2023-05-17 ENCOUNTER — Other Ambulatory Visit: Payer: Self-pay | Admitting: Neurosurgery

## 2023-06-18 ENCOUNTER — Ambulatory Visit: Payer: Medicare Other | Admitting: Orthopedic Surgery

## 2023-06-19 ENCOUNTER — Other Ambulatory Visit: Payer: Self-pay | Admitting: Orthopedic Surgery

## 2023-06-19 DIAGNOSIS — M25551 Pain in right hip: Secondary | ICD-10-CM

## 2023-06-19 DIAGNOSIS — Z981 Arthrodesis status: Secondary | ICD-10-CM

## 2023-06-19 NOTE — Progress Notes (Unsigned)
   REFERRING PHYSICIAN:  No referring provider defined for this encounter.  DOS: 03/30/23 L3-4 XLIF and L3-5 posterior spinal fusion   HISTORY OF PRESENT ILLNESS:  He was doing well at last visit with some soreness in his lower back and some right groin pain.   Smoking cessation was discussed. He was to continue on celebrex and robaxin. He is in PT for his back.    Get xrays of back and right hip?***    Called last week with increased right leg pain- he was out of oxycodone and this was refilled.   He has pain in right thigh and groin that is a little better. He has pain in his lower back as well.   He feels like he is slowly improving.    PHYSICAL EXAMINATION:  General: Patient is well developed, well nourished, calm, collected, and in no apparent distress.   NEUROLOGICAL:  General: In no acute distress.   Awake, alert, oriented to person, place, and time.  Pupils equal round and reactive to light.  Facial tone is symmetric.     Strength:            Side Iliopsoas Quads Hamstring PF DF EHL  R 4+*** 5 5 5 5 5   L 5 5 5 5 5 5     Incisions well healed.    ROS (Neurologic):  Negative except as noted above  IMAGING: Lumbar xrays dated ***:  No complications noted.***  Right hip xrays dated ***:  ***  Radiology report for above xrays not available yet.   ASSESSMENT/PLAN:  KRUE VANSKIKE is doing reasonable s/p above surgery. Treatment options reviewed with patient and following plan made:   - I have advised the patient to lift up to 10 pounds until 6 weeks after surgery (follow up with Dr. Myer Haff).  - Reviewed wound care.  - No bending, twisting, or lifting.  - Continue with HHPT.  - Discussed that right leg pain/weakness should continue to improve.  - Continue on current medications including prn oxycodone and prn robaxin. No refills needed.   - He notes some swelling in right lower abdomen. He has some mild tenderness, I don't appreciate any swelling.  Reviewed with Dr. Myer Haff and he does not think this is related to the surgery.  He agrees with above plan as well. Patient to follow up with his PCP regarding this. He was sent a message.  - Follow up as scheduled in 4 weeks and prn.   Advised to contact the office if any questions or concerns arise.  Drake Leach PA-C Department of neurosurgery

## 2023-06-24 ENCOUNTER — Ambulatory Visit
Admission: RE | Admit: 2023-06-24 | Discharge: 2023-06-24 | Disposition: A | Discharge status: Home / Self Care | Payer: Medicare Other | Source: Ambulatory Visit | Attending: Orthopedic Surgery | Admitting: Orthopedic Surgery

## 2023-06-24 ENCOUNTER — Ambulatory Visit: Payer: Medicare Other | Admitting: Orthopedic Surgery

## 2023-06-24 ENCOUNTER — Ambulatory Visit
Admission: RE | Admit: 2023-06-24 | Discharge: 2023-06-24 | Disposition: A | Discharge status: Home / Self Care | Payer: Medicare Other | Attending: Orthopedic Surgery | Admitting: Orthopedic Surgery

## 2023-06-24 ENCOUNTER — Encounter: Payer: Self-pay | Admitting: Orthopedic Surgery

## 2023-06-24 VITALS — BP 140/92 | Temp 97.8°F | Ht 72.0 in | Wt 160.0 lb

## 2023-06-24 DIAGNOSIS — M25551 Pain in right hip: Secondary | ICD-10-CM | POA: Diagnosis present

## 2023-06-24 DIAGNOSIS — Z981 Arthrodesis status: Secondary | ICD-10-CM

## 2023-06-24 DIAGNOSIS — W19XXXA Unspecified fall, initial encounter: Secondary | ICD-10-CM

## 2023-06-24 DIAGNOSIS — M5416 Radiculopathy, lumbar region: Secondary | ICD-10-CM

## 2023-06-24 NOTE — Patient Instructions (Signed)
It was nice to see you today.   I am sorry you are not feeling better yet.   Continue with PT for your back.   I put in referral for you to see ortho at [facility] in [place] for your right hip. They should call you or you can call them at [phone].   I will review everything with Dr. Myer Haff and let you know his recommendations.   I will send you a message once we get the radiology report for your hip xrays.   Your blood pressure was elevated today. I want you to recheck it at home and follow up with your PCP if it remains high. If you have any chest pain, shortness of breath, blurry vision, or headaches then you need to go to ED.    Please call with any questions or concerns.   Drake Leach PA-C [phone]     The physicians and staff at [facility] at [facility] are committed to providing excellent care. You may receive a survey asking for feedback about your experience at our office. We value you your feedback and appreciate you taking the time to to fill it out. The [facility] leadership team is also available to discuss your experience in person, feel free to contact us [phone].

## 2023-07-13 ENCOUNTER — Other Ambulatory Visit: Payer: Self-pay | Admitting: Orthopedic Surgery

## 2023-07-13 DIAGNOSIS — G8929 Other chronic pain: Secondary | ICD-10-CM

## 2023-07-20 NOTE — Addendum Note (Signed)
Addended byDrake Leach on: 07/20/2023 05:48 PM   Modules accepted: Orders

## 2023-07-20 NOTE — Telephone Encounter (Signed)
Called and spoke with patient.   DOS: 03/30/23 L3-4 XLIF and L3-5 posterior spinal fusion   He is having constant LBP with bilateral leg pain (front of leg) to his foot. He has intermittent numbness/tingling in both legs. He has weakness in both legs. He is able to walk with walker. This all started after fall on 07/11/23.   No new bowel/bladder issues.   Will order xrays and he will get tomorrow morning at [facility]. Will review them and see when he needs seen for follow up.   Will contact him after his xrays are reviewed.

## 2023-07-21 ENCOUNTER — Telehealth: Payer: Self-pay | Admitting: Orthopedic Surgery

## 2023-07-21 ENCOUNTER — Ambulatory Visit
Admission: RE | Admit: 2023-07-21 | Discharge: 2023-07-21 | Disposition: A | Discharge status: Home / Self Care | Payer: Medicare Other | Source: Ambulatory Visit | Attending: Orthopedic Surgery | Admitting: Orthopedic Surgery

## 2023-07-21 ENCOUNTER — Ambulatory Visit
Admission: RE | Admit: 2023-07-21 | Discharge: 2023-07-21 | Disposition: A | Discharge status: Home / Self Care | Payer: Medicare Other | Attending: Orthopedic Surgery | Admitting: Orthopedic Surgery

## 2023-07-21 DIAGNOSIS — W19XXXA Unspecified fall, initial encounter: Secondary | ICD-10-CM

## 2023-07-21 DIAGNOSIS — Z981 Arthrodesis status: Secondary | ICD-10-CM | POA: Diagnosis present

## 2023-07-21 NOTE — Telephone Encounter (Signed)
Patient notified and voiced understanding.

## 2023-07-21 NOTE — Telephone Encounter (Signed)
Lumbar xrays dated 07/21/23 reviewed and show no complications.   Report is not yet back.   Please let him know that I reviewed his xrays. He should keep his follow up appointment tomorrow.

## 2023-07-21 NOTE — Telephone Encounter (Signed)
He is booked with Nehemiah Settle tomorrow.

## 2023-07-21 NOTE — Telephone Encounter (Signed)
He probably should be seen in clinic as well. Please get him scheduled for a follow up- Justin Nixon has opening on Wednesday and I think I have one on Thursday.

## 2023-07-22 ENCOUNTER — Ambulatory Visit: Payer: Medicare Other | Admitting: Physician Assistant

## 2023-07-22 VITALS — BP 140/86 | Ht 72.0 in | Wt 160.0 lb

## 2023-07-22 DIAGNOSIS — M5416 Radiculopathy, lumbar region: Secondary | ICD-10-CM | POA: Diagnosis not present

## 2023-07-22 DIAGNOSIS — W19XXXA Unspecified fall, initial encounter: Secondary | ICD-10-CM | POA: Diagnosis not present

## 2023-07-22 DIAGNOSIS — Z981 Arthrodesis status: Secondary | ICD-10-CM

## 2023-07-22 MED ORDER — GABAPENTIN 300 MG PO CAPS: 300.0000 mg | ORAL_CAPSULE | Freq: Three times a day (TID) | ORAL | 4 refills | Status: DC

## 2023-07-22 NOTE — Progress Notes (Signed)
   REFERRING PHYSICIAN:  No referring provider defined for this encounter.  DOS: 03/30/23, L3-4 XLIF and L3-5 posterior spinal fusion   HISTORY OF PRESENT ILLNESS: Justin Nixon is approximately 4 months s/p L3-4 XLIF and L3-5 posterior spinal fusion.  He continues to have burning pain in his back that shoots down his right leg and into his foot with a continued tremor.  He has been taking Celebrex, gabapentin, and Robaxin.  He did have a fall couple weeks ago and saw physical therapy for the time being as a result.  Thankfully x-ray does not show any change to his hardware.  He continues to use a walker to ambulate.  He is also smoking regularly.  PHYSICAL EXAMINATION:  General: Patient is well developed, well nourished, calm, collected, and in no apparent distress.   NEUROLOGICAL:  General: In no acute distress.   Awake, alert, oriented to person,  place, and time.  Pupils equal round and reactive to light.  Facial tone is symmetric.     Strength:            Side Iliopsoas Quads Hamstring PF DF EHL  R 4+ 5 5 5 5 5   L 5 5 5 5 5 5    Incision c/d/i   ROS (Neurologic):  Negative except as noted above  IMAGING:  07/21/23:  AP and lateral x-rays of the lumbar spine completed which did not show any hardware failure or fracture.  Will review radiologist formal read when available.  ASSESSMENT/PLAN:  Justin Nixon is approximately 4 months s/p L3-4 XLIF and L3-5 posterior spinal fusion.  He continues to have burning pain in his back that shoots down his right leg and into his foot with a continued tremor.  He has been taking Celebrex, gabapentin, and Robaxin.  He did have a fall couple weeks ago and saw physical therapy for the time being as a result.  Thankfully x-ray does not show any change to his hardware.  He continues to use a walker to ambulate.  He is also smoking regularly.  Will review formal read by radiologist on recent lumbar x-rays.  Patient advised to continue physical  therapy exercises as warranted.  He was counseled on reducing his fall risk as well as the importance of smoking cessation in the setting of a lumbar fusion.  He is still in a considerable amount of nerve pain we are going to increase his gabapentin to 300 mg 3 times a day as opposed to once daily.  The risk and benefits of this were discussed at length with the patient and him and his spouse are in agreement.  He was encouraged to reach out to [place] for any questions or concerns that he has in the future.  Advised to contact the office if any questions or concerns arise.  Joan Flores PA-C Department of neurosurgery

## 2023-08-03 ENCOUNTER — Emergency Department
Admission: EM | Admit: 2023-08-03 | Discharge: 2023-08-03 | Disposition: A | Discharge status: Home / Self Care | Payer: Medicare Other | Attending: Emergency Medicine | Admitting: Emergency Medicine

## 2023-08-03 ENCOUNTER — Emergency Department: Payer: Medicare Other

## 2023-08-03 ENCOUNTER — Other Ambulatory Visit: Payer: Self-pay

## 2023-08-03 DIAGNOSIS — S0083XA Contusion of other part of head, initial encounter: Secondary | ICD-10-CM | POA: Insufficient documentation

## 2023-08-03 DIAGNOSIS — K029 Dental caries, unspecified: Secondary | ICD-10-CM | POA: Diagnosis not present

## 2023-08-03 DIAGNOSIS — K047 Periapical abscess without sinus: Secondary | ICD-10-CM | POA: Diagnosis not present

## 2023-08-03 DIAGNOSIS — W010XXA Fall on same level from slipping, tripping and stumbling without subsequent striking against object, initial encounter: Secondary | ICD-10-CM | POA: Insufficient documentation

## 2023-08-03 DIAGNOSIS — I10 Essential (primary) hypertension: Secondary | ICD-10-CM | POA: Insufficient documentation

## 2023-08-03 DIAGNOSIS — S0993XA Unspecified injury of face, initial encounter: Secondary | ICD-10-CM | POA: Diagnosis present

## 2023-08-03 MED ORDER — HYDROCODONE-ACETAMINOPHEN 5-325 MG PO TABS
1.0000 | ORAL_TABLET | Freq: Once | ORAL | Status: AC
  Administered 2023-08-03: 1 via ORAL
  Filled 2023-08-03: qty 1

## 2023-08-03 MED ORDER — HYDROCODONE-ACETAMINOPHEN 5-325 MG PO TABS: 1.0000 | ORAL_TABLET | Freq: Three times a day (TID) | ORAL | 0 refills | Status: AC | PRN

## 2023-08-03 MED ORDER — CLINDAMYCIN HCL 150 MG PO CAPS
300.0000 mg | ORAL_CAPSULE | Freq: Once | ORAL | Status: AC
  Administered 2023-08-03: 300 mg via ORAL
  Filled 2023-08-03: qty 2

## 2023-08-03 MED ORDER — CLINDAMYCIN HCL 300 MG PO CAPS: 300.0000 mg | ORAL_CAPSULE | Freq: Three times a day (TID) | ORAL | 0 refills | Status: AC

## 2023-08-03 NOTE — ED Provider Notes (Incomplete)
[facility] Emergency Department Provider Note     Event Date/Time   First MD Initiated Contact with Patient 08/03/23 1753     (approximate)   History   Facial Injury   HPI  Justin Nixon is a 47 y.o. male with a history of anxiety, HLD, hypertension, and schizophrenia, who presents to the ED for evaluation of local soft tissue swelling to the left side of the jaw.  Patient believes injury occurred on 12/2, when he slipped in the bathroom.  He denies any head injury or LOC.  He notes jaw swelling but denies any difficulty opening and closing the jaw.  No hearing change, tinnitus, vertigo, or other injury reported.  Physical Exam   Triage Vital Signs: ED Triage Vitals  Encounter Vitals Group     BP 08/03/23 1608 (!) 162/100     Systolic BP Percentile --      Diastolic BP Percentile --      Pulse Rate 08/03/23 1608 (!) 112     Resp 08/03/23 1608 18     Temp 08/03/23 1608 98.5 F (36.9 C)     Temp Source 08/03/23 1608 Oral     SpO2 08/03/23 1608 97 %     Weight 08/03/23 1609 159 lb (72.1 kg)     Height 08/03/23 1609 6' (1.829 m)     Head Circumference --      Peak Flow --      Pain Score 08/03/23 1609 10     Pain Loc --      Pain Education --      Exclude from Growth Chart --     Most recent vital signs: Vitals:   08/03/23 1608  BP: (!) 162/100  Pulse: (!) 112  Resp: 18  Temp: 98.5 F (36.9 C)  SpO2: 97%    General Awake, no distress. *** {**HEENT NCAT. PERRL. EOMI. No rhinorrhea. Mucous membranes are moist. **} CV:  Good peripheral perfusion. *** RESP:  Normal effort. *** ABD:  No distention. *** NEURO: ***   ED Results / Procedures / Treatments   Labs (all labs ordered are listed, but only abnormal results are displayed) Labs Reviewed - No data to display   EKG   RADIOLOGY  I personally viewed and evaluated these images as part of my medical decision making, as well as reviewing the written report by the  radiologist.  ED Provider Interpretation: Possible dental carry and local soft tissue swelling consistent with history of contusion  CT Maxillofacial Wo Contrast Result Date: 08/03/2023 CLINICAL DATA:  Facial trauma, blunt. Left-sided jaw pain after fall. EXAM: CT MAXILLOFACIAL WITHOUT CONTRAST TECHNIQUE: Multidetector CT imaging of the maxillofacial structures was performed. Multiplanar CT image reconstructions were also generated. RADIATION DOSE REDUCTION: This exam was performed according to the departmental dose-optimization program which includes automated exposure control, adjustment of the mA and/or kV according to patient size and/or use of iterative reconstruction technique. COMPARISON:  None Available. FINDINGS: Osseous: No fracture or mandibular dislocation. No destructive process. Orbits: Negative. No traumatic or inflammatory finding. Sinuses: Clear. Soft tissues: Edema/soft tissue thickening overlying the anterior left mandible. No discrete, drainable fluid collection. Limited intracranial: No significant or unexpected finding. Other: Dental carie involving the posterior-most left mandibular molar. Multiple periapical lucencies, including of the left second premolar. IMPRESSION: Edema/soft tissue thickening overlying the anterior left mandible which could be infectious in etiology given there is sclerosis of the adjacent mandible (suggesting possible osteomyelitis) and a periapical lucency and  carie of the adjacent left second premolar. This could also be secondary to contusion given reported recent fall/trauma to the left mandible. No discrete, drainable fluid collection. No evidence of acute fracture or TMJ dislocation. Electronically Signed   By: Feliberto Harts M.D.   On: 08/03/2023 18:14     PROCEDURES:  Critical Care performed: No  Procedures   MEDICATIONS ORDERED IN ED: Medications - No data to display   IMPRESSION / MDM / ASSESSMENT AND PLAN / ED COURSE  I reviewed the  triage vital signs and the nursing notes.                              Differential diagnosis includes, but is not limited to, facial contusion, dental caries, jaw fracture, hematoma  Patient's presentation is most consistent with acute complicated illness / injury requiring diagnostic workup.  Patient's diagnosis is consistent with ***. Patient will be discharged home with prescriptions for ***. Patient is to follow up with *** as needed or otherwise directed. Patient is given ED precautions to return to the ED for any worsening or new symptoms.     FINAL CLINICAL IMPRESSION(S) / ED DIAGNOSES   Final diagnoses:  Contusion of jaw, initial encounter  Dental caries     Rx / DC Orders   ED Discharge Orders     None        Note:  This document was prepared using Dragon voice recognition software and may include unintentional dictation errors.

## 2023-08-03 NOTE — ED Notes (Signed)
Patient verbalizes understanding of discharge instructions. Opportunity for questioning and answers were provided.   Prescriptions reviewed and pharmacy verified.   Patient discharged with family.

## 2023-08-03 NOTE — Discharge Instructions (Addendum)
Your exam and CT scan are consistent with local soft tissue swelling secondary to your jaw contusion, and a dental abscess. There is no evidence of a jaw fracture or dislocation. Take the antibiotic as directed. Apply warm compresses and rinse with warm salty water. Follow-up with your dental provider for ongoing care.

## 2023-08-03 NOTE — ED Triage Notes (Signed)
Pt presents to ED with /co of jaw injury from a  fall on 12/2 to L side of jaw, swelling noted.

## 2023-08-14 ENCOUNTER — Telehealth: Payer: Self-pay | Admitting: Neurosurgery

## 2023-08-14 NOTE — Telephone Encounter (Signed)
 Patient is calling to request a new referral to [facility] physical therapy in [place].

## 2023-08-17 ENCOUNTER — Other Ambulatory Visit: Payer: Self-pay | Admitting: Physician Assistant

## 2023-08-17 DIAGNOSIS — Z981 Arthrodesis status: Secondary | ICD-10-CM

## 2023-08-17 DIAGNOSIS — G8929 Other chronic pain: Secondary | ICD-10-CM

## 2023-08-17 NOTE — Telephone Encounter (Signed)
 Faxed referral and left patient a voicemail that this has been completed

## 2023-09-24 ENCOUNTER — Ambulatory Visit: Payer: Medicare Other | Admitting: Neurosurgery

## 2023-10-13 ENCOUNTER — Other Ambulatory Visit: Payer: Self-pay

## 2023-10-13 ENCOUNTER — Ambulatory Visit
Admission: RE | Admit: 2023-10-13 | Discharge: 2023-10-13 | Disposition: A | Discharge status: Home / Self Care | Source: Ambulatory Visit | Attending: Neurosurgery | Admitting: Neurosurgery

## 2023-10-13 ENCOUNTER — Encounter: Payer: Self-pay | Admitting: Neurosurgery

## 2023-10-13 ENCOUNTER — Ambulatory Visit: Payer: Medicare Other | Admitting: Neurosurgery

## 2023-10-13 ENCOUNTER — Ambulatory Visit
Admission: RE | Admit: 2023-10-13 | Discharge: 2023-10-13 | Disposition: A | Discharge status: Home / Self Care | Attending: Neurosurgery | Admitting: Neurosurgery

## 2023-10-13 VITALS — BP 128/82 | Ht 72.0 in | Wt 159.0 lb

## 2023-10-13 DIAGNOSIS — Z981 Arthrodesis status: Secondary | ICD-10-CM

## 2023-10-13 DIAGNOSIS — G8929 Other chronic pain: Secondary | ICD-10-CM

## 2023-10-13 DIAGNOSIS — R251 Tremor, unspecified: Secondary | ICD-10-CM | POA: Diagnosis not present

## 2023-10-13 DIAGNOSIS — M5441 Lumbago with sciatica, right side: Secondary | ICD-10-CM | POA: Diagnosis present

## 2023-10-13 DIAGNOSIS — M5442 Lumbago with sciatica, left side: Secondary | ICD-10-CM | POA: Diagnosis not present

## 2023-10-13 NOTE — Progress Notes (Signed)
   REFERRING PHYSICIAN:  Gracelyn Nurse, Md [address] [place],  [place] [address]  DOS: 03/30/23, L3-4 XLIF and L3-5 posterior spinal fusion   HISTORY OF PRESENT ILLNESS: Justin Nixon is approximately 7 months s/p L3-4 XLIF and L3-5 posterior spinal fusion.  His pain is currently 2 out of 10.  He is doing better.  He quit smoking again in January.  He recently had an infection in his jaw that required surgery.     PHYSICAL EXAMINATION:  General: Patient is well developed, well nourished, calm, collected, and in no apparent distress.   NEUROLOGICAL:  General: In no acute distress.   Awake, alert, oriented to person,  place, and time.  Pupils equal round and reactive to light.  Facial tone is symmetric.     Strength:            Side Iliopsoas Quads Hamstring PF DF EHL  R 5 5 5 5 5 5   L 5 5 5 5 5 5    Incision c/d/i   ROS (Neurologic):  Negative except as noted above  IMAGING:  07/21/23:  AP and lateral x-rays of the lumbar spine completed which did not show any hardware failure or fracture.  Will review radiologist formal read when available.  Recent imaging shows stable findings.  ASSESSMENT/PLAN:  Justin Nixon is  s/p L3-4 XLIF and L3-5 posterior spinal fusion.  He is doing somewhat better.  He continues to have a tremor in his right leg at times.  He would like to hold off on any additional therapy at this time.  I will see him back in approximately 6 months.    Venetia Night MD Department of neurosurgery

## 2023-10-20 ENCOUNTER — Encounter: Payer: Self-pay | Admitting: Neurosurgery

## 2024-01-18 ENCOUNTER — Other Ambulatory Visit: Payer: Self-pay

## 2024-01-18 ENCOUNTER — Emergency Department

## 2024-01-18 ENCOUNTER — Emergency Department
Admission: EM | Admit: 2024-01-18 | Discharge: 2024-01-18 | Disposition: A | Discharge status: Home / Self Care | Attending: Emergency Medicine | Admitting: Emergency Medicine

## 2024-01-18 DIAGNOSIS — I10 Essential (primary) hypertension: Secondary | ICD-10-CM | POA: Diagnosis not present

## 2024-01-18 DIAGNOSIS — W19XXXA Unspecified fall, initial encounter: Secondary | ICD-10-CM | POA: Insufficient documentation

## 2024-01-18 DIAGNOSIS — M545 Low back pain, unspecified: Secondary | ICD-10-CM | POA: Insufficient documentation

## 2024-01-18 DIAGNOSIS — M542 Cervicalgia: Secondary | ICD-10-CM | POA: Diagnosis not present

## 2024-01-18 DIAGNOSIS — G8929 Other chronic pain: Secondary | ICD-10-CM | POA: Diagnosis not present

## 2024-01-18 MED ORDER — LIDOCAINE 5 % EX PTCH
1.0000 | MEDICATED_PATCH | CUTANEOUS | Status: DC
  Administered 2024-01-18: 1 via TRANSDERMAL
  Filled 2024-01-18: qty 1

## 2024-01-18 NOTE — Discharge Instructions (Addendum)
 Return to taking your gabapentin  as previously prescribed by your doctor.  If you continue to have back pain after restarting your gabapentin  call make an appointment with Dr. Mont Antis for further evaluation.  A Lidoderm  patch was applied to your back while in the emergency department.  This can be purchased over-the-counter at the pharmacy if this gives you any relief.

## 2024-01-18 NOTE — ED Provider Notes (Signed)
 [facility] Provider Note    Event Date/Time   First MD Initiated Contact with Patient 01/18/24 1335     (approximate)   History   Back Pain   HPI  Justin Nixon is a 48 y.o. male   presents to the ED with complaint of mid to lower back pain after a mechanical fall that happened Saturday.  Patient states he felt that his back was just giving out and went down to his knees.  He denies any head injury or loss of consciousness.  Patient has a history of 2 prior back surgeries.  Patient also complains of some's neck stiffness but no headache, vision changes, nausea, vomiting or dizziness.  Patient has history of GERD, hypertension, anxiety, schizophrenia, anterior lumbar fusion.      Physical Exam   Triage Vital Signs: ED Triage Vitals  Encounter Vitals Group     BP 01/18/24 1302 (!) 164/106     Girls Systolic BP Percentile --      Girls Diastolic BP Percentile --      Boys Systolic BP Percentile --      Boys Diastolic BP Percentile --      Pulse Rate 01/18/24 1301 (!) 107     Resp 01/18/24 1301 18     Temp 01/18/24 1301 98 F (36.7 C)     Temp src --      SpO2 01/18/24 1301 100 %     Weight 01/18/24 1259 175 lb (79.4 kg)     Height 01/18/24 1259 6' (1.829 m)     Head Circumference --      Peak Flow --      Pain Score 01/18/24 1259 10     Pain Loc --      Pain Education --      Exclude from Growth Chart --     Most recent vital signs: Vitals:   01/18/24 1301 01/18/24 1302  BP:  (!) 164/106  Pulse: (!) 107   Resp: 18   Temp: 98 F (36.7 C)   SpO2: 100%      General: Awake, no distress.  Alert, talkative. CV:  Good peripheral perfusion.  Heart regular rate rhythm. Resp:  Normal effort.  Lungs clear bilaterally. Abd:  No distention.  Soft, nontender. Other:  Diffuse tenderness on palpation lower lumbar area.  Good muscle strength bilaterally.  Noted sensory function intact distal to the injury.  No abrasions or discoloration noted.   Exam of cervical spine there is no tenderness on palpation posteriorly.  Skin is intact and without discoloration.   ED Results / Procedures / Treatments   Labs (all labs ordered are listed, but only abnormal results are displayed) Labs Reviewed - No data to display   RADIOLOGY CT cervical spine per radiology is negative for acute fracture.  Multilevel cervical spondylosis with central disc protrusion at C3-C4 contributing to mild spinal stenosis.  Lumbar spine x-ray images were reviewed by myself and radiology report shows no changes with prior comparison views.  No fractures, spondylolisthesis or lithiasis per radiology.  PROCEDURES:  Critical Care performed:   Procedures   MEDICATIONS ORDERED IN ED: Medications  lidocaine  (LIDODERM ) 5 % 1 patch (1 patch Transdermal Patch Applied 01/18/24 1513)     IMPRESSION / MDM / ASSESSMENT AND PLAN / ED COURSE  I reviewed the triage vital signs and the nursing notes.   Differential diagnosis includes, but is not limited to, cervical/lumbar fracture, strain, contusion, malalignment, musculoskeletal pain.  48 year old male presents to the ED with complaint of low back pain and neck stiffness after a fall that occurred on Saturday.  Patient denied any head injury or loss of consciousness.  Patient has had back surgery and was concerned.  CT cervical spine and lumbar spine x-ray images per radiology are reassuring and patient was made aware.  He also states that his pain was being controlled by gabapentin  however he stopped this medication approximately 2 weeks ago as he did not like taking medication.  He states that he does have this medication at home and did not run out.  He agrees to start back on the gabapentin  as prescribed by his doctor.  A Lidoderm  patch was applied while in the emergency department.  Patient will call make an appointment with Dr. Mont Antis who is his neurosurgeon for follow-up.      Patient's presentation is most  consistent with acute complicated illness / injury requiring diagnostic workup.  FINAL CLINICAL IMPRESSION(S) / ED DIAGNOSES   Final diagnoses:  Acute exacerbation of chronic low back pain     Rx / DC Orders   ED Discharge Orders     None        Note:  This document was prepared using Dragon voice recognition software and may include unintentional dictation errors.   Stafford Eagles, PA-C 01/18/24 1614    Bryson Carbine, MD 01/22/24 [phone]

## 2024-01-18 NOTE — ED Triage Notes (Addendum)
 Pt comes with mid to lower back pain. Pt states pain shooting down his leg and burning. Pt did have fall on SAturday. Pt denies any loc or hitting head. Pt stats he fell down on his knee.  Pt states neck stiffness and pain also.   Pt states hx of two prior back surgeries. Family states the doctor wanted him to come on in and have MRI and then send to him

## 2024-01-18 NOTE — ED Notes (Signed)
 See triage note  Presents with mid to lower back pain  States pain is moving into right leg Denies any recent injury Has had back surgery in the past

## 2024-02-05 NOTE — Progress Notes (Unsigned)
 Referring Physician:  No referring provider defined for this encounter.  Primary Physician:  Rudolpho Norleen BIRCH, MD  History of Present Illness: 02/08/2024 Mr. Dishawn Crago has a history of GERD, HTN, anxiety, schizophrenia.   Most recently, he is s/p L3-4 XLIF and L3-5 posterior spinal fusion by Dr. Clois on 03/30/23. He was doing better at his last visit on 10/13/23.   Seen in ED on 01/18/24 with pain after a fall on 01/16/24. His pain has been worse since end of April. Fall did not make it worse.   He has constant LBP with intermittent bilateral leg pain (entire leg) to his feet. He has constant numbness in left leg to his foot. He has intermittent tingling and weakness in both legs to his feet. Pain is worse with walking and sitting. Some relief with laying flat.   Given lidoderm  from ED and advised to restart his neurontin  that he stopped 2 weeks prior. Getting back on neurontin  has helped legs a little. He is taking prn motrin .   He is trying to quit smoking- down to 3-4 per day.   Bowel/Bladder Dysfunction: he is having both urinary and bowel urgency issues x 3 months. He is wearing depends and when he wakes up in morning, he sometimes has some urinary or BM in his depends. No perineal numbness.   Conservative measures:  Physical therapy: none recent  Multimodal medical therapy including regular antiinflammatories: neurontin , motrin  Injections:  Did prior to surgery  Past Surgery:  L3-4 XLIF and L3-5 posterior spinal fusion by Dr. Clois on 03/30/23 L4-L5 XLIF/PSF 11/18/21  The symptoms are causing a significant impact on the patient's life.   Review of Systems:  A 10 point review of systems is negative, except for the pertinent positives and negatives detailed in the HPI.  Past Medical History: Past Medical History:  Diagnosis Date   Anxiety    Arthritis    GERD (gastroesophageal reflux disease)    H/O   Hyperlipidemia    Hypertension    Schizophrenia (HCC)      Past Surgical History: Past Surgical History:  Procedure Laterality Date   ANTERIOR LATERAL LUMBAR FUSION WITH PERCUTANEOUS SCREW 1 LEVEL N/A 11/18/2021   Procedure: L4-5 LATERAL LUMBAR INTERBODY FUSION WITH POSTERIOR SPINAL FUSION;  Surgeon: Clois Fret, MD;  Location: [facility];  Service: Neurosurgery;  Laterality: N/A;   ANTERIOR LUMBAR FUSION N/A 03/30/2023   Procedure: L3-4 LATERAL LUMBAR INTERBODY FUSION;  Surgeon: Clois Fret, MD;  Location: [facility];  Service: Neurosurgery;  Laterality: N/A;   APPLICATION OF INTRAOPERATIVE CT SCAN N/A 11/18/2021   Procedure: APPLICATION OF INTRAOPERATIVE CT SCAN;  Surgeon: Clois Fret, MD;  Location: [facility];  Service: Neurosurgery;  Laterality: N/A;   APPLICATION OF INTRAOPERATIVE CT SCAN N/A 03/30/2023   Procedure: APPLICATION OF INTRAOPERATIVE CT SCAN;  Surgeon: Clois Fret, MD;  Location: [facility];  Service: Neurosurgery;  Laterality: N/A;   COLONOSCOPY     CYST EXCISION     ELBOW ARTHROSCOPY Left 10/10/2020   Procedure: LEFT ELBOW ARTHROSCOPY WITH DEBRIDEMENT AND EXCISION OF LOOSE BODIES;  Surgeon: Edie Norleen PARAS, MD;  Location: [facility];  Service: Orthopedics;  Laterality: Left;   MYRINGOTOMY WITH TUBE PLACEMENT     ORIF ELBOW FRACTURE Left 08/28/2021   Procedure: OPEN REDUCTION INTERNAL FIXATION (ORIF) ELBOW/OLECRANON FRACTURE;  Surgeon: Edie Norleen PARAS, MD;  Location: [facility];  Service: Orthopedics;  Laterality: Left;   SHOULDER ARTHROSCOPY Left 03/18/2017   Procedure: ARTHROSCOPY SHOULDER with debridement decompression and  distal clavicle excision;  Surgeon: Edie Norleen PARAS, MD;  Location: [facility];  Service: Orthopedics;  Laterality: Left;   SHOULDER ARTHROSCOPY WITH OPEN ROTATOR CUFF REPAIR Right 09/23/2016   Procedure: SHOULDER ARTHROSCOPY WITH OPEN ROTATOR CUFF REPAIR;  Surgeon: Norleen PARAS Edie, MD;  Location: [facility];  Service: Orthopedics;  Laterality: Right;   SHOULDER ARTHROSCOPY WITH SUBACROMIAL  DECOMPRESSION Right 09/23/2016   Procedure: SHOULDER ARTHROSCOPY WITH SUBACROMIAL DECOMPRESSION AND DEBRIDEMENT;  Surgeon: Norleen PARAS Edie, MD;  Location: [facility];  Service: Orthopedics;  Laterality: Right;    Allergies: Allergies as of 02/08/2024 - Review Complete 02/08/2024  Allergen Reaction Noted   Penicillins Itching 08/07/2016    Medications: Outpatient Encounter Medications as of 02/08/2024  Medication Sig   gabapentin  (NEURONTIN ) 300 MG capsule Take 1 capsule (300 mg total) by mouth 3 (three) times daily.   lisinopril  (ZESTRIL ) 20 MG tablet Take by mouth.   lurasidone  (LATUDA ) 40 MG TABS tablet Take 40 mg by mouth daily after supper.   Multiple Vitamins-Minerals (MULTIVITAMIN WITH MINERALS) tablet Take 1 tablet by mouth daily.   ondansetron  (ZOFRAN ) 4 MG tablet Take 1 tablet (4 mg total) by mouth every 8 (eight) hours as needed for nausea or vomiting.   senna (SENOKOT) 8.6 MG TABS tablet Take 1 tablet (8.6 mg total) by mouth daily as needed for mild constipation.   traZODone (DESYREL) 100 MG tablet Take 100-200 mg by mouth at bedtime.   [DISCONTINUED] celecoxib  (CELEBREX ) 200 MG capsule Take 200 mg by mouth 2 (two) times daily.   [DISCONTINUED] gabapentin  (NEURONTIN ) 300 MG capsule TAKE 1 CAPSULE(300 MG) BY MOUTH AT BEDTIME   [DISCONTINUED] methocarbamol  (ROBAXIN ) 500 MG tablet TAKE 1 TABLET(500 MG) BY MOUTH EVERY 6 HOURS AS NEEDED FOR MUSCLE SPASMS   No facility-administered encounter medications on file as of 02/08/2024.    Social History: Social History   Tobacco Use   Smoking status: Some Days    Average packs/day: 0.5 packs/day for 22.0 years (11.0 ttl pk-yrs)    Types: Cigarettes    Start date: 02/2023    Last attempt to quit: 01/1994    Years since quitting: 30.1   Smokeless tobacco: Never  Vaping Use   Vaping status: Never Used  Substance Use Topics   Alcohol use: Not Currently    Comment: OCC   Drug use: No    Family Medical History: No family history on  file.  Physical Examination: Vitals:   02/08/24 1100 02/08/24 1121  BP: (!) 158/108 (!) 150/98   Awake, alert, oriented to person, place, and time.  Speech is clear and fluent. Fund of knowledge is appropriate.   Cranial Nerves: Pupils equal round and reactive to light.  Facial tone is symmetric.    Well healed lumbar and right lateral incisions.   No abnormal lesions on exposed skin.   Strength: Side Biceps Triceps Deltoid Interossei Grip Wrist Ext. Wrist Flex.  R 5 5 5 5 5 5 5   L 5 5 5 5 5 5 5    Side Iliopsoas Quads Hamstring PF DF EHL  R 5 5 5 5 5 5   L 5 5 5 5 5 5    No gross weakness with above strength testing, but he has pain.   Reflexes are 2+ and symmetric at the biceps, brachioradialis, patella and achilles.   Hoffman's is absent.  Clonus is not present.   Bilateral upper and lower extremity sensation is intact to light touch, but diminished in entire left lower extremity.  He ambulates with walker and gait is slow.   Medical Decision Making  Imaging: Lumbar xrays dated 01/18/24:  FINDINGS: Stable transpedicular fixation of L3-L4 and L5 with near anatomic alignment, intervertebral disc replacement and disc arthroplasty L3-L4 L4-5 stable since prior examination with minimal narrowing of the L5-S1 disc space   No fractures, no spondylolysis or listhesis   IMPRESSION: No significant change compared with prior examination. No acute findings     Electronically Signed   By: Franky Chard M.D.   On: 01/18/2024 14:00  I have personally reviewed the images and agree with the above interpretation.  Assessment and Plan: Mr. Phifer is s/p L3-4 XLIF and L3-5 posterior spinal fusion by Dr. Clois on 03/30/23.  His pain has been worse since end of April. Fall on 01/18/24 did not make it worse.   He has constant LBP with intermittent bilateral leg pain (entire leg) to his feet. He has constant numbness in left leg to his foot. He has intermittent tingling and  weakness in both legs to his feet.  Xrays from ED show no complications regarding hardware L3-L5. He has DDD L2-L3 with retrolisthesis.   Since April, he notes bowel and bladder urgency. No perineal numbness. He notes having urine/BM in his depends sometimes when he wakes up on in the morning. No perineal numbness.   Treatment options discussed with patient and following plan made:   - MRI of lumbar spine to further evaluate lumbar radiculopathy.  - Continue on neurontin .  - Bowel/bladder issues not likely lumbar mediated. Depending on MRI results, may refer to urology/GI.  - He will let me know when MRI is completed so we can get it read.  - Will schedule phone visit to review MRI results.  - He will keep scheduled follow up with Dr. Clois opn 04/14/24.   Above plan reviewed with Dr. Claudene.   BP was elevated. No symptoms of chest pain, shortness of breath, blurry vision, or headaches. Will recheck at home and call PCP if not improved. If he develops CP, SOB, blurry vision, or headaches, then he will go to ED.     I spent a total of 30 minutes in face-to-face and non-face-to-face activities related to this patient's care today including review of outside records, review of imaging, review of symptoms, physical exam, discussion of differential diagnosis, discussion of treatment options, and documentation.   Glade Boys PA-C Dept. of Neurosurgery

## 2024-02-08 ENCOUNTER — Encounter: Payer: Self-pay | Admitting: Orthopedic Surgery

## 2024-02-08 ENCOUNTER — Ambulatory Visit: Admitting: Orthopedic Surgery

## 2024-02-08 VITALS — BP 150/98 | Ht 72.0 in | Wt 160.0 lb

## 2024-02-08 DIAGNOSIS — M4316 Spondylolisthesis, lumbar region: Secondary | ICD-10-CM | POA: Diagnosis not present

## 2024-02-08 DIAGNOSIS — M5416 Radiculopathy, lumbar region: Secondary | ICD-10-CM

## 2024-02-08 DIAGNOSIS — Z981 Arthrodesis status: Secondary | ICD-10-CM | POA: Diagnosis not present

## 2024-02-08 DIAGNOSIS — M47816 Spondylosis without myelopathy or radiculopathy, lumbar region: Secondary | ICD-10-CM

## 2024-02-08 DIAGNOSIS — M51362 Other intervertebral disc degeneration, lumbar region with discogenic back pain and lower extremity pain: Secondary | ICD-10-CM

## 2024-02-08 NOTE — Patient Instructions (Signed)
 It was so nice to see you today. Thank you so much for coming in.    I am sorry that you are feeling worse.   I want to get an MRI of your lumbar spine to look into things further. We will get this approved through your insurance and [facility] will call you to schedule the appointment. Ask about your patient responsibility. You do not need to pay this prior to getting MRI, they can bill you.   After you have the MRI, it takes 14-28 days for me to get the results back. We will try to get it read after 2 weeks if not done.   Once I have the results, we will call you to schedule a follow up phone visit with me to review them.   Your blood pressure was elevated today. I want you to recheck it at home and follow up with your PCP if it remains high. If you have any chest pain, shortness of breath, blurry vision, or headaches then you need to go to ED.    Please do not hesitate to call if you have any questions or concerns. You can also message me in MyChart.   Glade Boys PA-C [phone]     The physicians and staff at [facility] at [facility] are committed to providing excellent care. You may receive a survey asking for feedback about your experience at our office. We value you your feedback and appreciate you taking the time to to fill it out. The [facility] leadership team is also available to discuss your experience in person, feel free to contact us  [phone].

## 2024-02-11 ENCOUNTER — Ambulatory Visit
Admission: RE | Admit: 2024-02-11 | Discharge: 2024-02-11 | Disposition: A | Discharge status: Home / Self Care | Source: Ambulatory Visit | Attending: Orthopedic Surgery | Admitting: Orthopedic Surgery

## 2024-02-11 DIAGNOSIS — Z981 Arthrodesis status: Secondary | ICD-10-CM | POA: Diagnosis present

## 2024-02-11 DIAGNOSIS — M47816 Spondylosis without myelopathy or radiculopathy, lumbar region: Secondary | ICD-10-CM | POA: Insufficient documentation

## 2024-02-11 DIAGNOSIS — M5416 Radiculopathy, lumbar region: Secondary | ICD-10-CM | POA: Diagnosis present

## 2024-02-12 NOTE — Progress Notes (Unsigned)
 Virtual Visit- Progress Note: Referring Physician:  No referring provider defined for this encounter.  Primary Physician:  Rudolpho Norleen BIRCH, MD  This visit was performed via telephone.  Patient location: home Provider location: office  I spent a total of *** minutes non-face-to-face activities for this visit on the date of this encounter including review of current clinical condition and response to treatment.    Patient has given verbal consent to this virtual visit and we reviewed the limitations of a telephone visit. Patient wishes to proceed.    Chief Complaint:  review lumbar MRI   History of Present Illness: Jentzen L Pedretti is a 48 y.o. male has a history of GERD, HTN, anxiety, schizophrenia.    Most recently, he is s/p L3-4 XLIF and L3-5 posterior spinal fusion by Dr. Clois on 03/30/23.   Last seen by me on 02/08/24 with increased back and bilateral leg pain along with bowel and bladder issues x 3+ months.   Virtual visit scheduled to review his lumbar MRI scan.        Seen in ED on 01/18/24 with pain after a fall on 01/16/24. His pain has been worse since end of April. Fall did not make it worse.    He has constant LBP with intermittent bilateral leg pain (entire leg) to his feet. He has constant numbness in left leg to his foot. He has intermittent tingling and weakness in both legs to his feet. Pain is worse with walking and sitting. Some relief with laying flat.    Given lidoderm  from ED and advised to restart his neurontin  that he stopped 2 weeks prior. Getting back on neurontin  has helped legs a little. He is taking prn motrin .    He is trying to quit smoking- down to 3-4 per day.    Bowel/Bladder Dysfunction: he is having both urinary and bowel urgency issues x 3 months. He is wearing depends and when he wakes up in morning, he sometimes has some urinary or BM in his depends. No perineal numbness.    Conservative measures:  Physical therapy: none recent   Multimodal medical therapy including regular antiinflammatories: neurontin , motrin  Injections:  Did prior to surgery   Past Surgery:  L3-4 XLIF and L3-5 posterior spinal fusion by Dr. Clois on 03/30/23 L4-L5 XLIF/PSF 11/18/21   The symptoms are causing a significant impact on the patient's life.   Exam: No exam done as this was a telephone encounter.   Imaging: Lumbar MRI dated 02/11/24:  FINDINGS: Segmentation: 5 lumbar type vertebral bodies. S1 has some transitional features.   Alignment: 2 mm degenerative retrolisthesis L1-2 and L2-3 as seen previously.   Vertebrae: No fracture or focal bone lesion. Interval fusion at L3-4.   Conus medullaris and cauda equina: Conus extends to the L2 level. Conus and cauda equina appear normal.   Paraspinal and other soft tissues: Negative   Disc levels:   T12-L1: Normal   L1-2: 2 mm retrolisthesis. Disc degeneration with circumferential bulging. No compressive stenosis. Similar appearance to the prior exam.   L2-3: 2 mm retrolisthesis. Disc degeneration with bulging, more towards the right. Previously seen right posterolateral herniation has involuted. Mild narrowing of the lateral recesses but no visible neural compression.   L3-4: Interval posterior decompression, diskectomy and fusion. Good appearance with wide patency of the canal and foramina.   L4-5: Previous posterior decompression, diskectomy and fusion. Good appearance with wide patency of the canal and foramina.   L5-S1: Minimal disc bulge. Mild facet  degeneration. No stenosis of the canal or foramina. No worsening.   IMPRESSION: 1. Interval posterior decompression, diskectomy and fusion at L3-4. Good appearance with wide patency of the canal and foramina. 2. Previous posterior decompression, diskectomy and fusion at L4-5. Good appearance with wide patency of the canal and foramina. 3. L1-2: 2 mm retrolisthesis. Disc degeneration with circumferential bulging.  No compressive stenosis. 4. L2-3: 2 mm retrolisthesis. Disc degeneration with bulging more towards the right. Previously seen right posterolateral herniation has involuted. Mild narrowing of the lateral recesses but no visible neural compression. 5. L5-S1: Minimal disc bulge. Mild facet degeneration. No stenosis.     Electronically Signed   By: Oneil Officer M.D.   On: 02/12/2024 08:54  I have personally reviewed the images and agree with the above interpretation.  Assessment and Plan: Mr. Strothers is s/p L3-4 XLIF and L3-5 posterior spinal fusion by Dr. Clois on 03/30/23.   His pain has been worse since end of April. Fall on 01/18/24 did not make it worse.    He has constant LBP with intermittent bilateral leg pain (entire leg) to his feet. He has constant numbness in left leg to his foot. He has intermittent tingling and weakness in both legs to his feet.   Xrays from ED show no complications regarding hardware L3-L5. He has DDD L2-L3 with retrolisthesis.    Since April, he notes bowel and bladder urgency. No perineal numbness. He notes having urine/BM in his depends sometimes when he wakes up on in the morning. No perineal numbness.    Treatment options discussed with patient and following plan made:    - MRI of lumbar spine to further evaluate lumbar radiculopathy.  - Continue on neurontin .  - Bowel/bladder issues not likely lumbar mediated. Depending on MRI results, may refer to urology/GI.  - He will let me know when MRI is completed so we can get it read.  - Will schedule phone visit to review MRI results.  - He will keep scheduled follow up with Dr. Clois opn 04/14/24.   Glade Boys PA-C Neurosurgery

## 2024-02-15 ENCOUNTER — Encounter: Payer: Self-pay | Admitting: Orthopedic Surgery

## 2024-02-15 ENCOUNTER — Telehealth: Admitting: Orthopedic Surgery

## 2024-02-15 DIAGNOSIS — Z981 Arthrodesis status: Secondary | ICD-10-CM

## 2024-02-15 DIAGNOSIS — R152 Fecal urgency: Secondary | ICD-10-CM

## 2024-02-15 DIAGNOSIS — M47816 Spondylosis without myelopathy or radiculopathy, lumbar region: Secondary | ICD-10-CM

## 2024-02-15 DIAGNOSIS — R3915 Urgency of urination: Secondary | ICD-10-CM

## 2024-02-15 DIAGNOSIS — M5416 Radiculopathy, lumbar region: Secondary | ICD-10-CM

## 2024-02-15 DIAGNOSIS — K639 Disease of intestine, unspecified: Secondary | ICD-10-CM

## 2024-02-15 DIAGNOSIS — M4726 Other spondylosis with radiculopathy, lumbar region: Secondary | ICD-10-CM | POA: Diagnosis not present

## 2024-02-15 NOTE — Patient Instructions (Addendum)
 It was good to talk to you today.   I will review everything with Dr. Clois and message you.   I put in a referral to urology in [place]. They should call you, but you can call them at [phone] if you don't hear anything.   I also sent a referral to GI at the [facility]. Their number is [phone]. They should call you as well.   Let me know if you need anything.   Glade

## 2024-03-16 ENCOUNTER — Ambulatory Visit: Admitting: Urology

## 2024-03-16 VITALS — BP 158/91 | HR 95 | Wt 165.0 lb

## 2024-03-16 DIAGNOSIS — R3915 Urgency of urination: Secondary | ICD-10-CM | POA: Diagnosis not present

## 2024-03-16 DIAGNOSIS — N3281 Overactive bladder: Secondary | ICD-10-CM

## 2024-03-16 DIAGNOSIS — N529 Male erectile dysfunction, unspecified: Secondary | ICD-10-CM

## 2024-03-16 LAB — URINALYSIS, COMPLETE
Bilirubin, UA: NEGATIVE
Glucose, UA: NEGATIVE
Leukocytes,UA: NEGATIVE
Nitrite, UA: NEGATIVE
Protein,UA: NEGATIVE
RBC, UA: NEGATIVE
Specific Gravity, UA: 1.025 (ref 1.005–1.030)
Urobilinogen, Ur: 0.2 mg/dL (ref 0.2–1.0)
pH, UA: 6 (ref 5.0–7.5)

## 2024-03-16 LAB — MICROSCOPIC EXAMINATION

## 2024-03-16 LAB — BLADDER SCAN AMB NON-IMAGING

## 2024-03-16 MED ORDER — OXYBUTYNIN CHLORIDE ER 10 MG PO TB24: 10.0000 mg | ORAL_TABLET | Freq: Every day | ORAL | 11 refills | Status: DC

## 2024-03-16 MED ORDER — TADALAFIL 5 MG PO TABS: 5.0000 mg | ORAL_TABLET | Freq: Every day | ORAL | 11 refills | Status: DC | PRN

## 2024-03-16 NOTE — Patient Instructions (Signed)

## 2024-03-16 NOTE — Progress Notes (Signed)
 03/16/24 9:26 AM   Justin Nixon 1975-12-03 [id number]  CC: Urinary frequency, ED  HPI: 48 year old male with multiple prior spinal surgeries with Dr. Katrina including L3-L4 and L4-L5 lumbar fusion, who reports worsening urinary symptoms since his surgery.  Prior to surgery he denied any urinary issues.  His primary urinary issue is frequency, urgency, urge incontinence, and incontinence overnight.  He feels he urinates with a strong stream.  Urinalysis today benign, PVR normal at 17ml.  He denies any dysuria or gross hematuria.  He drinks primarily water  during the day.  He is a smoker.  He also has had problems with ED, able to achieve some erections but difficulty with maintaining.  Has never tried medications for bladder or erections before.  Per most recent neurosurgery note, they are considering a spinal cord stimulator.  PSA was normal at 0.43 in December 2023   PMH: Past Medical History:  Diagnosis Date   Anxiety    Arthritis    GERD (gastroesophageal reflux disease)    H/O   Hyperlipidemia    Hypertension    Schizophrenia (HCC)     Surgical History: Past Surgical History:  Procedure Laterality Date   ANTERIOR LATERAL LUMBAR FUSION WITH PERCUTANEOUS SCREW 1 LEVEL N/A 11/18/2021   Procedure: L4-5 LATERAL LUMBAR INTERBODY FUSION WITH POSTERIOR SPINAL FUSION;  Surgeon: Clois Fret, MD;  Location: [facility];  Service: Neurosurgery;  Laterality: N/A;   ANTERIOR LUMBAR FUSION N/A 03/30/2023   Procedure: L3-4 LATERAL LUMBAR INTERBODY FUSION;  Surgeon: Clois Fret, MD;  Location: [facility];  Service: Neurosurgery;  Laterality: N/A;   APPLICATION OF INTRAOPERATIVE CT SCAN N/A 11/18/2021   Procedure: APPLICATION OF INTRAOPERATIVE CT SCAN;  Surgeon: Clois Fret, MD;  Location: [facility];  Service: Neurosurgery;  Laterality: N/A;   APPLICATION OF INTRAOPERATIVE CT SCAN N/A 03/30/2023   Procedure: APPLICATION OF INTRAOPERATIVE CT SCAN;  Surgeon: Clois Fret,  MD;  Location: [facility];  Service: Neurosurgery;  Laterality: N/A;   COLONOSCOPY     CYST EXCISION     ELBOW ARTHROSCOPY Left 10/10/2020   Procedure: LEFT ELBOW ARTHROSCOPY WITH DEBRIDEMENT AND EXCISION OF LOOSE BODIES;  Surgeon: Edie Norleen PARAS, MD;  Location: [facility];  Service: Orthopedics;  Laterality: Left;   MYRINGOTOMY WITH TUBE PLACEMENT     ORIF ELBOW FRACTURE Left 08/28/2021   Procedure: OPEN REDUCTION INTERNAL FIXATION (ORIF) ELBOW/OLECRANON FRACTURE;  Surgeon: Edie Norleen PARAS, MD;  Location: [facility];  Service: Orthopedics;  Laterality: Left;   SHOULDER ARTHROSCOPY Left 03/18/2017   Procedure: ARTHROSCOPY SHOULDER with debridement decompression and distal clavicle excision;  Surgeon: Edie Norleen PARAS, MD;  Location: [facility];  Service: Orthopedics;  Laterality: Left;   SHOULDER ARTHROSCOPY WITH OPEN ROTATOR CUFF REPAIR Right 09/23/2016   Procedure: SHOULDER ARTHROSCOPY WITH OPEN ROTATOR CUFF REPAIR;  Surgeon: Norleen PARAS Edie, MD;  Location: [facility];  Service: Orthopedics;  Laterality: Right;   SHOULDER ARTHROSCOPY WITH SUBACROMIAL DECOMPRESSION Right 09/23/2016   Procedure: SHOULDER ARTHROSCOPY WITH SUBACROMIAL DECOMPRESSION AND DEBRIDEMENT;  Surgeon: Norleen PARAS Edie, MD;  Location: [facility];  Service: Orthopedics;  Laterality: Right;    Family History: No family history on file.  Social History:  reports that he has been smoking cigarettes. He started smoking about 13 months ago. He has a 11 pack-year smoking history. He has never used smokeless tobacco. He reports that he does not currently use alcohol. He reports that he does not use drugs.  Physical Exam: BP (!) 158/91 (BP Location: Left Arm,  Patient Position: Sitting, Cuff Size: Normal)   Pulse 95   Wt 165 lb (74.8 kg)   SpO2 98%   BMI 22.38 kg/m    Constitutional:  Alert and oriented, No acute distress. Cardiovascular: No clubbing, cyanosis, or edema. Respiratory: Normal respiratory effort, no increased work of  breathing. GI: Abdomen is soft, nontender, nondistended, no abdominal masses   Laboratory Data: Reviewed, see HPI   Assessment & Plan:   48 year old male with multiple prior lumbar fusion procedures with neurosurgery with worsening urinary symptoms with frequency, urgency, urge incontinence since that time.  PVR and urinalysis are normal.  Also has ED, has never tried medications for that.  We discussed that overactive bladder (OAB) is not a disease, but is a symptom complex that is generally not life-threatening.  Symptoms typically include urinary urgency, frequency, and urge incontinence.  There are numerous treatment options, however there are risks and benefits with both medical and surgical management.  First-line treatment is behavioral therapies including bladder training, pelvic floor muscle training, and fluid management.  Second line treatments include oral antimuscarinics(Ditropan  er, Trospium) and beta-3 agonist (Mybetriq). There is typically a period of medication trial (4-8 weeks) to find the optimal therapy and dosing. If symptoms are bothersome despite the above management, third line options include intra-detrusor botox, peripheral tibial nerve stimulation (PTNS), and interstim (SNS). These are more invasive treatments with higher side effect profile, but may improve quality of life for patients with severe OAB symptoms.   Complexities of neurogenic bladder/OAB discussed at length.  He was interested in a trial of OAB medication, risks and benefits were discussed.  He also is interested in a trial of Cialis  for ED, and risks and benefits were reviewed.  Trial of oxybutynin  10 mg XL daily for OAB Trial of Cialis  5 to 20 mg on demand for ED RTC 4 to 6 weeks symptom check, PVR   Redell Burnet, MD 03/16/2024  [facility] [address] [place], [place] [address] [phone]

## 2024-03-21 DIAGNOSIS — Z981 Arthrodesis status: Secondary | ICD-10-CM

## 2024-03-21 DIAGNOSIS — M5416 Radiculopathy, lumbar region: Secondary | ICD-10-CM

## 2024-03-21 NOTE — Telephone Encounter (Signed)
 Patient advised. Appointment made for 03/22/2024

## 2024-03-21 NOTE — Telephone Encounter (Signed)
 Please call him to find out more information:   Is it back and legs or just back pain? Where in legs?   Any numbness, tingling, or weakness in his legs?   He's had some bowel and bladder issues- are these the same or have they changed?   Looks like bending over helps and standing up makes him worse, any other alleviating/aggravating factors?   Besides motrin , is he taking anything else for pain?

## 2024-03-21 NOTE — Addendum Note (Signed)
 Addended byBETHA HILMA HASTINGS on: 03/21/2024 01:25 PM   Modules accepted: Orders

## 2024-03-21 NOTE — Telephone Encounter (Signed)
 Please call him to find out more information:    Is it back and legs or just back pain? Where in legs? -Lower mid back -where he had surgery yet, pain radiates to the right hip when he is walking, radiates to the back of his right leg off and on and radiates to the bottom of his right foot.   Legs shakes with trying to walk and move due to the pain. On scale of 0 to 10 with movement his pain is a 10, with resting is a 10 also.  Any numbness, tingling, or weakness in his legs? Feels like his legs are weak with walking but thinks its because of the severe pain and that is why his legs feel like that.  Has the same numbness sensation on the bottom of left foot, unchanged.  He's had some bowel and bladder issues- are these the same or have they changed? Same no change   Looks like bending over helps and standing up makes him worse, any other alleviating/aggravating factors? Nothing really has helped. With walking pain is present and muscle spasms in the back. Laying down does not help.   Besides motrin , is he taking anything else for pain? Takes Gabapentin  300 mg 1 tablet 3 times daily still but nothing else.  Overall getting worse since Friday 03/18/2024.

## 2024-03-21 NOTE — Telephone Encounter (Signed)
 He should be seen for evaluation. Looks like Justin Nixon has opening tomorrow, please see if he can come at that time. If not, can be seen at any available slot.   He also has option of going to ED if pain is severe.   He should get xrays prior to his visit. I will order them.

## 2024-03-22 ENCOUNTER — Ambulatory Visit
Admission: RE | Admit: 2024-03-22 | Discharge: 2024-03-22 | Disposition: A | Discharge status: Home / Self Care | Source: Ambulatory Visit | Attending: Orthopedic Surgery | Admitting: Orthopedic Surgery

## 2024-03-22 ENCOUNTER — Ambulatory Visit
Admission: RE | Admit: 2024-03-22 | Discharge: 2024-03-22 | Disposition: A | Discharge status: Home / Self Care | Attending: Orthopedic Surgery | Admitting: Orthopedic Surgery

## 2024-03-22 ENCOUNTER — Ambulatory Visit: Admitting: Physician Assistant

## 2024-03-22 VITALS — BP 138/98 | Ht 72.0 in | Wt 165.0 lb

## 2024-03-22 DIAGNOSIS — M5416 Radiculopathy, lumbar region: Secondary | ICD-10-CM | POA: Insufficient documentation

## 2024-03-22 DIAGNOSIS — M79671 Pain in right foot: Secondary | ICD-10-CM

## 2024-03-22 DIAGNOSIS — W19XXXA Unspecified fall, initial encounter: Secondary | ICD-10-CM

## 2024-03-22 DIAGNOSIS — M79604 Pain in right leg: Secondary | ICD-10-CM

## 2024-03-22 DIAGNOSIS — Z981 Arthrodesis status: Secondary | ICD-10-CM | POA: Diagnosis present

## 2024-03-22 DIAGNOSIS — G8929 Other chronic pain: Secondary | ICD-10-CM

## 2024-03-22 DIAGNOSIS — M62838 Other muscle spasm: Secondary | ICD-10-CM

## 2024-03-22 MED ORDER — METHOCARBAMOL 500 MG PO TABS: 500.0000 mg | ORAL_TABLET | Freq: Three times a day (TID) | ORAL | 2 refills | Status: DC | PRN
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
## 2024-04-04 ENCOUNTER — Emergency Department
Admission: EM | Admit: 2024-04-04 | Discharge: 2024-04-04 | Disposition: A | Discharge status: Home / Self Care | Attending: Emergency Medicine | Admitting: Emergency Medicine

## 2024-04-04 ENCOUNTER — Emergency Department

## 2024-04-04 ENCOUNTER — Encounter: Payer: Self-pay | Admitting: Emergency Medicine

## 2024-04-04 ENCOUNTER — Other Ambulatory Visit: Payer: Self-pay

## 2024-04-04 DIAGNOSIS — S80211A Abrasion, right knee, initial encounter: Secondary | ICD-10-CM | POA: Insufficient documentation

## 2024-04-04 DIAGNOSIS — I10 Essential (primary) hypertension: Secondary | ICD-10-CM | POA: Insufficient documentation

## 2024-04-04 DIAGNOSIS — M545 Low back pain, unspecified: Secondary | ICD-10-CM | POA: Insufficient documentation

## 2024-04-04 DIAGNOSIS — G8911 Acute pain due to trauma: Secondary | ICD-10-CM | POA: Insufficient documentation

## 2024-04-04 DIAGNOSIS — S0591XA Unspecified injury of right eye and orbit, initial encounter: Secondary | ICD-10-CM

## 2024-04-04 DIAGNOSIS — S0993XA Unspecified injury of face, initial encounter: Secondary | ICD-10-CM | POA: Insufficient documentation

## 2024-04-04 MED ORDER — LIDOCAINE 5 % EX PTCH: 1.0000 | MEDICATED_PATCH | CUTANEOUS | 0 refills | Status: AC

## 2024-04-04 MED ORDER — FLUORESCEIN SODIUM 1 MG OP STRP
1.0000 | ORAL_STRIP | Freq: Once | OPHTHALMIC | Status: AC
  Administered 2024-04-04: 1 via OPHTHALMIC
  Filled 2024-04-04: qty 1

## 2024-04-04 MED ORDER — ACETAMINOPHEN 500 MG PO TABS
1000.0000 mg | ORAL_TABLET | Freq: Once | ORAL | Status: AC
  Administered 2024-04-04: 1000 mg via ORAL
  Filled 2024-04-04: qty 2

## 2024-04-04 MED ORDER — IBUPROFEN 600 MG PO TABS
600.0000 mg | ORAL_TABLET | Freq: Once | ORAL | Status: AC
  Administered 2024-04-04: 600 mg via ORAL
  Filled 2024-04-04: qty 1

## 2024-04-04 MED ORDER — TETRACAINE HCL 0.5 % OP SOLN
2.0000 [drp] | Freq: Once | OPHTHALMIC | Status: AC
  Administered 2024-04-04: 2 [drp] via OPHTHALMIC
  Filled 2024-04-04: qty 4

## 2024-04-04 MED ORDER — LIDOCAINE 5 % EX PTCH
1.0000 | MEDICATED_PATCH | CUTANEOUS | Status: DC
  Administered 2024-04-04: 1 via TRANSDERMAL
  Filled 2024-04-04: qty 1
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
## 2024-04-05 ENCOUNTER — Telehealth: Payer: Self-pay | Admitting: Neurosurgery
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
## 2024-04-05 NOTE — Telephone Encounter (Signed)
 Spoke to patient and informed him to contact PCP. He states he has a wheelchair coming so he is ok for now.

## 2024-04-06 ENCOUNTER — Emergency Department

## 2024-04-06 ENCOUNTER — Other Ambulatory Visit: Payer: Self-pay

## 2024-04-06 ENCOUNTER — Emergency Department
Admission: EM | Admit: 2024-04-06 | Discharge: 2024-04-06 | Discharge status: Against Medical Advice | Attending: Student | Admitting: Student

## 2024-04-06 DIAGNOSIS — H53121 Transient visual loss, right eye: Secondary | ICD-10-CM | POA: Insufficient documentation

## 2024-04-06 DIAGNOSIS — M79651 Pain in right thigh: Secondary | ICD-10-CM | POA: Insufficient documentation

## 2024-04-06 DIAGNOSIS — Z5321 Procedure and treatment not carried out due to patient leaving prior to being seen by health care provider: Secondary | ICD-10-CM | POA: Diagnosis not present
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
## 2024-04-06 NOTE — ED Notes (Signed)
 Called pt several times from lobby with no answer; called phone # listed in chart and pt st already left and went home

## 2024-04-06 NOTE — ED Triage Notes (Signed)
 Patient was seen and discharged from here on 9/1; followed up today with [facility] where they have sent him back to have MRI, MRA and neuro workup.

## 2024-04-14 ENCOUNTER — Encounter: Payer: Self-pay | Admitting: Neurosurgery

## 2024-04-14 ENCOUNTER — Ambulatory Visit: Admitting: Neurosurgery

## 2024-04-14 VITALS — BP 152/90 | Ht 72.0 in | Wt 165.0 lb

## 2024-04-14 DIAGNOSIS — M5441 Lumbago with sciatica, right side: Secondary | ICD-10-CM | POA: Diagnosis not present

## 2024-04-14 DIAGNOSIS — G8929 Other chronic pain: Secondary | ICD-10-CM | POA: Diagnosis not present

## 2024-04-14 DIAGNOSIS — Z981 Arthrodesis status: Secondary | ICD-10-CM | POA: Diagnosis not present

## 2024-04-14 DIAGNOSIS — M5442 Lumbago with sciatica, left side: Secondary | ICD-10-CM

## 2024-04-14 MED ORDER — MELOXICAM 15 MG PO TABS: 15.0000 mg | ORAL_TABLET | Freq: Every day | ORAL | 0 refills | Status: DC
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
## 2024-04-27 ENCOUNTER — Ambulatory Visit: Admitting: Urology

## 2024-05-12 ENCOUNTER — Ambulatory Visit: Admitting: Neurosurgery

## 2024-06-07 ENCOUNTER — Ambulatory Visit: Admitting: Neurosurgery

## 2024-06-07 ENCOUNTER — Encounter: Payer: Self-pay | Admitting: Neurosurgery

## 2024-06-07 VITALS — BP 144/96 | Ht 72.0 in | Wt 165.0 lb

## 2024-06-07 DIAGNOSIS — G8929 Other chronic pain: Secondary | ICD-10-CM

## 2024-06-07 DIAGNOSIS — M5441 Lumbago with sciatica, right side: Secondary | ICD-10-CM | POA: Diagnosis not present

## 2024-06-07 DIAGNOSIS — M5442 Lumbago with sciatica, left side: Secondary | ICD-10-CM | POA: Diagnosis not present

## 2024-06-07 DIAGNOSIS — M961 Postlaminectomy syndrome, not elsewhere classified: Secondary | ICD-10-CM

## 2024-06-07 NOTE — Patient Instructions (Signed)
 [facility] - televisits [phone] Not in network with [facility] - televisits [phone] Not in network with [facility] in [place] Provider: Duwaine [place], [place] [phone] Accepts [facility] Dr Corina in [place] [phone] Accepts [facility], but is typically booked out about 10 months Dr Achilles in [place] [phone]  [facility] (offices in [place], [place], and [place]) [phone]   Please let us  know which one of the above psychologist's you would like to see for evaluation prior to the spinal cord stimulator trial. These are the only providers we are aware of that perform this type of evaluation. Once we fax the referral, please call them to set up an appointment (they do not typically call you).

## 2024-06-07 NOTE — Progress Notes (Signed)
   REFERRING PHYSICIAN:  Rudolpho Norleen BIRCH, Md [address] [place],  [place] [address]  DOS: 03/30/23, L3-4 XLIF and L3-5 posterior spinal fusion   HISTORY OF PRESENT ILLNESS: Justin Nixon is s/p L3-4 XLIF and L3-5 posterior spinal fusion.  He has been trying NSAIDs without improvement.  He would like to discuss spinal cord stimulation.   PHYSICAL EXAMINATION:  General: Patient is well developed, well nourished, calm, collected, and in no apparent distress.   NEUROLOGICAL:  General: In no acute distress.   Awake, alert, oriented to person,  place, and time.  Pupils equal round and reactive to light.  Facial tone is symmetric.     Strength:            Side Iliopsoas Quads Hamstring PF DF EHL  R 5 5 5 5 5 5   L 5 5 5 5 5 5    Incision c/d/i   ROS (Neurologic):  Negative except as noted above  IMAGING:  07/21/23:  AP and lateral x-rays of the lumbar spine completed which did not show any hardware failure or fracture.  Will review radiologist formal read when available.  Recent imaging shows stable findings.  Imaging from September 1 also was stable.  ASSESSMENT/PLAN:  Justin Nixon is s/p L3-4 XLIF and L3-5 posterior spinal fusion.  At this point, he meets criteria for postlaminectomy syndrome.  He has continued back pain that is impacting him.  He would like to consider a spinal cord stimulator.  I do not think that further spine surgery is indicated at this point.  I will make the appropriate referrals.   I spent a total of 10 minutes in this patient's care today. This time was spent reviewing pertinent records including imaging studies, obtaining and confirming history, performing a directed evaluation, formulating and discussing my recommendations, and documenting the visit within the medical record.   [name] MD Department of neurosurgery   `

## 2024-06-11 ENCOUNTER — Ambulatory Visit
Admission: RE | Admit: 2024-06-11 | Discharge: 2024-06-11 | Disposition: A | Discharge status: Home / Self Care | Source: Ambulatory Visit | Attending: Neurosurgery | Admitting: Neurosurgery

## 2024-06-11 DIAGNOSIS — G8929 Other chronic pain: Secondary | ICD-10-CM

## 2024-06-11 DIAGNOSIS — M961 Postlaminectomy syndrome, not elsewhere classified: Secondary | ICD-10-CM

## 2024-07-05 DIAGNOSIS — R937 Abnormal findings on diagnostic imaging of other parts of musculoskeletal system: Secondary | ICD-10-CM | POA: Insufficient documentation

## 2024-07-05 DIAGNOSIS — G894 Chronic pain syndrome: Secondary | ICD-10-CM | POA: Insufficient documentation

## 2024-07-05 DIAGNOSIS — Z79899 Other long term (current) drug therapy: Secondary | ICD-10-CM | POA: Insufficient documentation

## 2024-07-05 DIAGNOSIS — Z789 Other specified health status: Secondary | ICD-10-CM | POA: Insufficient documentation

## 2024-07-05 DIAGNOSIS — M899 Disorder of bone, unspecified: Secondary | ICD-10-CM | POA: Insufficient documentation

## 2024-07-05 NOTE — Progress Notes (Unsigned)
 PROVIDER NOTE: Interpretation of information contained herein should be left to medically-trained personnel. Specific patient instructions are provided elsewhere under Patient Instructions section of medical record. This document was created in part using AI and STT-dictation technology, any transcriptional errors that may result from this process are unintentional.  Patient: Justin Nixon  Service: E/M Encounter  Provider: Eric DELENA Como, MD  DOB: 27-Jun-1976  Delivery: Face-to-face  Specialty: Interventional Pain Management  MRN: [id number]  Setting: Ambulatory outpatient facility  Specialty designation: 09  Type: New Patient  Location: Outpatient office facility  PCP: Rudolpho Norleen BIRCH, MD  DOS: 07/06/2024    Referring Prov.: Clois Fret, MD   Primary Reason(s) for Visit: Encounter for initial evaluation of one or more chronic problems (new to examiner) potentially causing chronic pain, and posing a threat to normal musculoskeletal function. (Level of risk: High) CC: No chief complaint on file.  HPI  Justin Nixon is a 48 y.o. year old, male patient, who comes for the first time to our practice referred by Clois Fret, MD for our initial evaluation of his chronic pain. 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); Disorder of carbohydrate transport and metabolism; Elevated blood pressure reading; Essential hypertension; Finger, open wounds with tendon injury, initial encounter; Injury of digital nerve of finger; Lateral epicondylitis, elbow (Left); Numbness;  Paranoid schizophrenia (HCC); Plica syndrome of elbow (Left); Rotator cuff tendinitis (Left); Rotator cuff tendinitis (Right); Tobacco use; Open displaced intra-articular fracture of olecranon process of ulna (Left); Chronic pain syndrome; Pharmacologic therapy; Disorder of skeletal system; Problems influencing health status; and Abnormal MRI, lumbar spine (02/12/2024) on their problem list. Today he comes in for evaluation of his No chief complaint on file.  Pain Assessment: Location:     Radiating:   Onset:   Duration:   Quality:   Severity:  /10 (subjective, self-reported pain score)  Effect on ADL:   Timing:   Modifying factors:   BP:    HR:    Onset and Duration: {Hx; Onset and Duration:210120511} Cause of pain: {Hx; Cause:210120521} Severity: {Pain Severity:210120502} Timing: {Symptoms; Timing:210120501} Aggravating Factors: {Causes; Aggravating pain factors:210120507} Alleviating Factors: {Causes; Alleviating Factors:210120500} Associated Problems: {Hx; Associated problems:210120515} Quality of Pain: {Hx; Symptom quality or Descriptor:210120531} Previous Examinations or Tests: {Hx; Previous examinations or test:210120529} Previous Treatments: {Hx; Previous Treatment:210120503}  Justin Nixon is being evaluated for possible interventional pain management therapies for the treatment of his chronic pain.  Discussed the use of AI scribe software for clinical note transcription with the patient, who gave verbal consent to proceed.  History of Present Illness            Justin Nixon has been informed that this initial visit was an evaluation only.  On the follow up appointment I will go over the results, including ordered tests and available interventional therapies. At that time he will have the opportunity to decide whether to proceed with offered therapies or not. In the event that Justin Nixon prefers avoiding interventional options, this will conclude our involvement in the case.   Medication management recommendations may be provided upon request.  Patient informed that diagnostic tests may be ordered to  assist in identifying underlying causes, narrow the list of differential diagnoses and aid in determining candidacy for (or contraindications to) planned therapeutic interventions.  Historic Controlled Substance Pharmacotherapy Review PMP and historical list of controlled substances: Hydrocodone /APAP 5/325, 1 tab p.o. 3 times daily (# 7) (last filled on 09/17/2023); gabapentin  300 mg capsule, 1 cap p.o. 3 times daily (# 90) (last filled on 07/22/2023); oxycodone  IR 5 mg tablet, 1 tab p.o. every 4 hours (# 28) (last filled on 04/23/2023) Most recently prescribed controlled substance(s): Opioid Analgesic: None MME/day: 0 mg/day  Historical Monitoring: The patient  reports no history of drug use. List of prior UDS Testing: No results found for: MDMA, COCAINSCRNUR, PCPSCRNUR, PCPQUANT, CANNABQUANT, THCU, ETH, CBDTHCR, D8THCCBX, D9THCCBX Historical Background Evaluation: [place] PMP: PDMP reviewed during this encounter. Review of the past 28-months conducted.             PMP NARX Score Report:  Narcotic: 090 Sedative: 040 Stimulant: 000 [place] Department of public safety, offender search: Engineer, [employer] Information) Non-contributory Risk Assessment Profile: Aberrant behavior: None observed or detected today Risk factors for fatal opioid overdose: None identified today PMP NARX Overdose Risk Score: 330 Fatal overdose hazard ratio (HR): Calculation deferred Non-fatal overdose hazard ratio (HR): Calculation deferred Risk of opioid abuse or dependence: 0.7-3.0% with doses <= 36 MME/day and 6.1-26% with doses >= 120 MME/day. Substance use disorder (SUD) risk level: See below Personal History of Substance Abuse (SUD-Substance use disorder):  Alcohol:    Illegal Drugs:    Rx Drugs:    ORT Risk Level calculation:    ORT Scoring interpretation table:  Score <3 = Low  Risk for SUD  Score between 4-7 = Moderate Risk for SUD  Score >8 = High Risk for Opioid Abuse   PHQ-2 Depression Scale:  Total score:    PHQ-2 Scoring interpretation table: (Score and probability of major depressive disorder)  Score 0 = No depression  Score 1 = 15.4% Probability  Score 2 = 21.1% Probability  Score 3 = 38.4% Probability  Score 4 = 45.5% Probability  Score 5 = 56.4% Probability  Score 6 = 78.6% Probability   PHQ-9 Depression Scale:  Total score:    PHQ-9 Scoring interpretation table:  Score 0-4 = No depression  Score 5-9 = Mild depression  Score 10-14 = Moderate depression  Score 15-19 = Moderately severe depression  Score 20-27 = Severe depression (2.4 times higher risk of SUD and 2.89 times higher risk of overuse)   Pharmacologic Plan: As per protocol, I have not taken over any controlled substance management, pending the results of ordered tests and/or consults.            Initial impression: Pending review of available data and ordered tests.  Meds   Current Outpatient Medications:    gabapentin  (NEURONTIN ) 300 MG capsule, Take 1 capsule (300 mg total) by mouth 3 (three) times daily., Disp: 90 capsule, Rfl: 4   lisinopril  (ZESTRIL ) 20 MG tablet, Take by mouth., Disp: , Rfl:    lurasidone  (LATUDA ) 40 MG TABS tablet, Take 40 mg by mouth daily after supper., Disp: , Rfl:    meloxicam  (MOBIC ) 15 MG tablet, Take 1 tablet (15 mg total) by mouth daily., Disp: 60 tablet, Rfl: 0   methocarbamol  (ROBAXIN ) 500 MG tablet, Take 1 tablet (500 mg total) by mouth every 8 (eight) hours as needed for muscle spasms., Disp: 120 tablet, Rfl: 2   Multiple Vitamins-Minerals (MULTIVITAMIN WITH MINERALS) tablet, Take 1 tablet by mouth daily., Disp: ,  Rfl:    ondansetron  (ZOFRAN ) 4 MG tablet, Take 1 tablet (4 mg total) by mouth every 8 (eight) hours as needed for nausea or vomiting., Disp: 10 tablet, Rfl: 0   oxybutynin  (DITROPAN -XL) 10 MG 24 hr tablet, Take 1 tablet (10 mg total) by  mouth daily., Disp: 30 tablet, Rfl: 11   prednisoLONE acetate (PRED FORTE) 1 % ophthalmic suspension, Place 1 drop into the right eye 4 (four) times daily., Disp: , Rfl:    senna (SENOKOT) 8.6 MG TABS tablet, Take 1 tablet (8.6 mg total) by mouth daily as needed for mild constipation., Disp: 30 tablet, Rfl: 0   tadalafil  (CIALIS ) 5 MG tablet, Take 1-4 tablets (5-20 mg total) by mouth daily as needed for erectile dysfunction (take 1 hour prior to sexual activity)., Disp: 30 tablet, Rfl: 11  Imaging Review  Cervical Imaging: Cervical MR wo contrast: No results found for this or any previous visit.  Cervical MR wo contrast: No results found for this or any previous visit.  Cervical MR w/wo contrast: No results found for this or any previous visit.  Cervical MR w contrast: No results found for this or any previous visit.  Cervical CT wo contrast: Results for orders placed during the hospital encounter of 04/06/24  CT Cervical Spine Wo Contrast  Narrative CLINICAL DATA:  Rounded history: Right eye transient right vision loss  Recent injury.  EXAM: CT CERVICAL SPINE WITHOUT CONTRAST  TECHNIQUE: Multidetector CT imaging of the cervical spine was performed without intravenous contrast. Multiplanar CT image reconstructions were also generated.  RADIATION DOSE REDUCTION: This exam was performed according to the departmental dose-optimization program which includes automated exposure control, adjustment of the mA and/or kV according to patient size and/or use of iterative reconstruction technique.  COMPARISON:  Cervical spine CT 2 days prior  FINDINGS: Alignment: Normal.  Skull base and vertebrae: No acute fracture. No primary bone lesion or focal pathologic process.  Soft tissues and spinal canal: No prevertebral fluid or swelling. No visible canal hematoma.  Disc levels: Right facet hypertrophy at C4-C5. Anterior spurring at multiple levels. Stable disc protrusion at  C3-C4.  Upper chest: No acute findings.  Mild emphysema.  Other: None.  IMPRESSION: Stable CT of the cervical spine. Stable degenerative change without acute findings.   Electronically Signed By: Andrea Gasman M.D. On: 04/06/2024 17:54  Cervical CT w/wo contrast: No results found for this or any previous visit.  Cervical CT w/wo contrast: No results found for this or any previous visit.  Cervical CT w contrast: No results found for this or any previous visit.  Cervical CT outside: No results found for this or any previous visit.  Cervical DG 1 view: No results found for this or any previous visit.  Cervical DG 2-3 views: No results found for this or any previous visit.  Cervical DG F/E views: No results found for this or any previous visit.  Cervical DG 2-3 clearing views: No results found for this or any previous visit.  Cervical DG Bending/F/E views: No results found for this or any previous visit.  Cervical DG complete: No results found for this or any previous visit.  Cervical DG Myelogram views: No results found for this or any previous visit.  Cervical DG Myelogram views: No results found for this or any previous visit.  Cervical Discogram views: No results found for this or any previous visit.   Shoulder Imaging: Shoulder-R MR w contrast: No results found for this or any previous visit.  Shoulder-L MR w contrast: No results found for this or any previous visit.  Shoulder-R MR w/wo contrast: No results found for this or any previous visit.  Shoulder-L MR w/wo contrast: No results found for this or any previous visit.  Shoulder-R MR wo contrast: Results for orders placed during the hospital encounter of 07/04/16  MR SHOULDER RIGHT WO CONTRAST  Narrative CLINICAL DATA:  Shoulder pain and inability to raise right arm after dislocating shoulder last night. No previous relevant surgery.  EXAM: MRI OF THE RIGHT SHOULDER WITHOUT  CONTRAST  TECHNIQUE: Multiplanar, multisequence MR imaging of the shoulder was performed. No intravenous contrast was administered.  COMPARISON:  None.  FINDINGS: Rotator cuff: There is extensive edema within and surrounding the infraspinatus muscle and tendon. The tendon is diffusely attenuated and at least partially torn. There may be some intact fibers on the sagittal images, although some tendon retraction is suggested on the coronal images. The supraspinatus, subscapularis and teres minor tendons are intact.  Muscles: As above, there is extensive edema/ hemorrhage within and surrounding the infraspinatus muscle.  Biceps long head:  Intact and normally positioned.  Acromioclavicular Joint: The acromion is type 2. There are mild acromioclavicular degenerative changes. A small amount of fluid is present posteriorly in the subacromial-subdeltoid bursa.  Glenohumeral Joint: The humeral head is located. There is no significant glenohumeral joint effusion. Small focus of susceptibility artifact interposed between the humeral head and the glenoid on coronal image number 11 could reflect vacuum phenomenon.Small loose body or labral fragment difficult to exclude.  Labrum: Labral evaluation limited by the lack joint fluid. No definite labral abnormality seen.  Bones: The humeral head is located. There is no evidence of Hill-Sachs deformity or osseous Bankart lesion.  Other: No significant soft tissue findings.  IMPRESSION: 1. Significant infraspinatus injury with extensive muscular edema. There is least high-grade partial tearing of the infraspinatus tendon with some tendon retraction on the coronal images. 2. The humeral head is located. No Hill-Sachs or osseous Bankart lesions identified. 3. Labral assessment is limited by the lack joint fluid. MR arthrography should be considered given the reported dislocation.   Electronically Signed By: Elsie Perone M.D. On:  07/04/2016 15:27  Shoulder-L MR wo contrast: Results for orders placed during the hospital encounter of 03/06/17  MR SHOULDER LEFT WO CONTRAST  Narrative CLINICAL DATA:  Left shoulder pain for 1 year.  No known injury.  EXAM: MRI OF THE LEFT SHOULDER WITHOUT CONTRAST  TECHNIQUE: Multiplanar, multisequence MR imaging of the shoulder was performed. No intravenous contrast was administered.  COMPARISON:  None.  FINDINGS: Rotator cuff: Intact. Mildly increased T2 signal in the supraspinatus tendon consistent with tendinopathy noted.  Muscles:  Normal without atrophy or focal lesion.  Biceps long head:  Intact and normal in appearance.  Acromioclavicular Joint: Degenerated with fairly marked marrow edema about the joint. Type II acromion. No subacromial/subdeltoid bursal fluid.  Glenohumeral Joint: Negative.  Labrum:  Intact.  Bones:  No fracture or worrisome lesion.  Other: None.  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  Mild supraspinatus tendinopathy without tear.   Electronically Signed By: Debby Prader M.D. On: 03/06/2017 14:52  Shoulder-R CT w contrast: No results found for this or any previous visit.  Shoulder-L CT w contrast: No results found for this or any previous visit.  Shoulder-R CT w/wo contrast: No results found for this or any previous visit.  Shoulder-L CT w/wo contrast: No results found for  this or any previous visit.  Shoulder-R CT wo contrast: No results found for this or any previous visit.  Shoulder-L CT wo contrast: No results found for this or any previous visit.  Shoulder-R DG Arthrogram: No results found for this or any previous visit.  Shoulder-L DG Arthrogram: No results found for this or any previous visit.  Shoulder-R DG 1 view: No results found for this or any previous visit.  Shoulder-L DG 1 view: No results found for this or any previous  visit.  Shoulder-R DG: No results found for this or any previous visit.  Shoulder-L DG: No results found for this or any previous visit.   Thoracic Imaging: Thoracic MR wo contrast: Results for orders placed during the hospital encounter of 06/11/24  MR THORACIC SPINE WO CONTRAST  Narrative EXAM: MR Thoracic Spine without 06/11/2024 04:26:27 PM  TECHNIQUE: Multiplanar multisequence MRI of the thoracic spine was performed without the administration of intravenous contrast.  COMPARISON: None available  CLINICAL HISTORY: Chronic pain syndrome.  FINDINGS:  BONES AND ALIGNMENT: Normal alignment. Normal vertebral body heights. Marrow signal is unremarkable.  SPINAL CORD: Normal spinal cord volume. Normal spinal cord signal.  SOFT TISSUES: Unremarkable.  DEGENERATIVE CHANGES: No significant disc herniation. No spinal canal stenosis or neural foraminal narrowing.  IMPRESSION: 1. No spinal canal stenosis or neural foraminal narrowing in the thoracic spine. 2. No intradural or spinal cord signal abnormality.  Electronically signed by: prentice spade 06/15/2024 04:10 PM EST RP Workstation: [id number]  Thoracic MR wo contrast: No results found for this or any previous visit.  Thoracic MR w/wo contrast: No results found for this or any previous visit.  Thoracic MR w contrast: No results found for this or any previous visit.  Thoracic CT wo contrast: No results found for this or any previous visit.  Thoracic CT w/wo contrast: No results found for this or any previous visit.  Thoracic CT w/wo contrast: No results found for this or any previous visit.  Thoracic CT w contrast: No results found for this or any previous visit.  Thoracic DG 2-3 views: No results found for this or any previous visit.  Thoracic DG 4 views: No results found for this or any previous visit.  Thoracic DG: No results found for this or any previous visit.  Thoracic DG w/swimmers view: No  results found for this or any previous visit.  Thoracic DG Myelogram views: No results found for this or any previous visit.  Thoracic DG Myelogram views: No results found for this or any previous visit.   Lumbosacral Imaging: Lumbar MR wo contrast: Results for orders placed during the hospital encounter of 02/11/24  MR LUMBAR SPINE WO CONTRAST  Narrative CLINICAL DATA:  Low back pain. Previous surgery. Low back pain radiating to both legs with numbness of the left leg. Symptoms of at least 4 months duration.  EXAM: MRI LUMBAR SPINE WITHOUT CONTRAST  TECHNIQUE: Multiplanar, multisequence MR imaging of the lumbar spine was performed. No intravenous contrast was administered.  COMPARISON:  02/03/2023  FINDINGS: Segmentation: 5 lumbar type vertebral bodies. S1 has some transitional features.  Alignment: 2 mm degenerative retrolisthesis L1-2 and L2-3 as seen previously.  Vertebrae: No fracture or focal bone lesion. Interval fusion at L3-4.  Conus medullaris and cauda equina: Conus extends to the L2 level. Conus and cauda equina appear normal.  Paraspinal and other soft tissues: Negative  Disc levels:  T12-L1: Normal  L1-2: 2 mm retrolisthesis. Disc degeneration with circumferential bulging. No compressive stenosis. Similar  appearance to the prior exam.  L2-3: 2 mm retrolisthesis. Disc degeneration with bulging, more towards the right. Previously seen right posterolateral herniation has involuted. Mild narrowing of the lateral recesses but no visible neural compression.  L3-4: Interval posterior decompression, diskectomy and fusion. Good appearance with wide patency of the canal and foramina.  L4-5: Previous posterior decompression, diskectomy and fusion. Good appearance with wide patency of the canal and foramina.  L5-S1: Minimal disc bulge. Mild facet degeneration. No stenosis of the canal or foramina. No worsening.  IMPRESSION: 1. Interval posterior  decompression, diskectomy and fusion at L3-4. Good appearance with wide patency of the canal and foramina. 2. Previous posterior decompression, diskectomy and fusion at L4-5. Good appearance with wide patency of the canal and foramina. 3. L1-2: 2 mm retrolisthesis. Disc degeneration with circumferential bulging. No compressive stenosis. 4. L2-3: 2 mm retrolisthesis. Disc degeneration with bulging more towards the right. Previously seen right posterolateral herniation has involuted. Mild narrowing of the lateral recesses but no visible neural compression. 5. L5-S1: Minimal disc bulge. Mild facet degeneration. No stenosis.   Electronically Signed By: Oneil Officer M.D. On: 02/12/2024 08:54  Lumbar MR wo contrast: No results found for this or any previous visit.  Lumbar MR w/wo contrast: No results found for this or any previous visit.  Lumbar MR w/wo contrast: No results found for this or any previous visit.  Lumbar MR w contrast: No results found for this or any previous visit.  Lumbar CT wo contrast: No results found for this or any previous visit.  Lumbar CT w/wo contrast: No results found for this or any previous visit.  Lumbar CT w/wo contrast: No results found for this or any previous visit.  Lumbar CT w contrast: No results found for this or any previous visit.  Lumbar DG 1V: No results found for this or any previous visit.  Lumbar DG 1V (Clearing): No results found for this or any previous visit.  Lumbar DG 2-3V (Clearing): No results found for this or any previous visit.  Lumbar DG 2-3 views: Results for orders placed during the hospital encounter of 04/04/24  DG Lumbar Spine 2-3 Views  Narrative CLINICAL DATA:  Initial evaluation for acute pain status post fall.  EXAM: LUMBAR SPINE - 2-3 VIEW  COMPARISON:  Prior study from 03/22/2024  FINDINGS: 5 non rib-bearing lumbar type vertebral bodies are present. Mild levoscoliosis with grade 1 degenerative  retrolisthesis of L2 on L3 and L5 on S1, stable. Prior PLIF at L3-4 and L4-5. Hardware intact without complication. Vertebral body height maintained without acute or chronic fracture. Visualized pelvis intact.  Mild degenerative disc disease at L1-2 and L2-3.  Visualized soft tissues demonstrate no acute finding.  IMPRESSION: 1. No radiographic evidence for acute traumatic injury within the lumbar spine. 2. Prior PLIF at L3-4 and L4-5.  No hardware complication. 3. Mild levoscoliosis with grade 1 degenerative retrolisthesis of L2 on L3 and L5 on S1, stable.   Electronically Signed By: Morene Hoard M.D. On: 04/04/2024 04:05  Lumbar DG (Complete) 4+V: No results found for this or any previous visit.        Lumbar DG F/E views: No results found for this or any previous visit.        Lumbar DG Bending views: No results found for this or any previous visit.        Lumbar DG Myelogram views: No results found for this or any previous visit.  Lumbar DG Myelogram: No results found for  this or any previous visit.  Lumbar DG Myelogram: No results found for this or any previous visit.  Lumbar DG Myelogram: No results found for this or any previous visit.  Lumbar DG Myelogram Lumbosacral: No results found for this or any previous visit.  Lumbar DG Diskogram views: No results found for this or any previous visit.  Lumbar DG Diskogram views: No results found for this or any previous visit.  Lumbar DG Epidurogram OP: No results found for this or any previous visit.  Lumbar DG Epidurogram IP: No results found for this or any previous visit.   Sacroiliac Joint Imaging: Sacroiliac Joint DG: No results found for this or any previous visit.  Sacroiliac Joint MR w/wo contrast: No results found for this or any previous visit.  Sacroiliac Joint MR wo contrast: No results found for this or any previous visit.   Spine Imaging: Whole Spine DG Myelogram views: No results found for  this or any previous visit.  Whole Spine MR Mets screen: No results found for this or any previous visit.  Whole Spine MR Mets screen: No results found for this or any previous visit.  Whole Spine MR w/wo: No results found for this or any previous visit.  MRA Spinal Canal w/ cm: No results found for this or any previous visit.  MRA Spinal Canal wo/ cm: No results found for this or any previous visit.  MRA Spinal Canal w/wo cm: No results found for this or any previous visit.  Spine Outside MR Films: No results found for this or any previous visit.  Spine Outside CT Films: No results found for this or any previous visit.  CT-Guided Biopsy: No results found for this or any previous visit.  CT-Guided Needle Placement: No results found for this or any previous visit.  DG Spine outside: No results found for this or any previous visit.  IR Spine outside: No results found for this or any previous visit.  NM Spine outside: No results found for this or any previous visit.   Hip Imaging: Hip-R MR w contrast: No results found for this or any previous visit.  Hip-L MR w contrast: No results found for this or any previous visit.  Hip-R MR w/wo contrast: No results found for this or any previous visit.  Hip-L MR w/wo contrast: No results found for this or any previous visit.  Hip-R MR wo contrast: No results found for this or any previous visit.  Hip-L MR wo contrast: No results found for this or any previous visit.  Hip-R CT w contrast: No results found for this or any previous visit.  Hip-L CT w contrast: No results found for this or any previous visit.  Hip-R CT w/wo contrast: No results found for this or any previous visit.  Hip-L CT w/wo contrast: No results found for this or any previous visit.  Hip-R CT wo contrast: No results found for this or any previous visit.  Hip-L CT wo contrast: No results found for this or any previous visit.  Hip-R DG 2-3 views: Results for  orders placed during the hospital encounter of 04/04/24  DG Hip Unilat W or Wo Pelvis 2-3 Views Right  Narrative CLINICAL DATA:  Initial evaluation for acute pain status post fall.  EXAM: DG HIP (WITH OR WITHOUT PELVIS) 2-3V RIGHT  COMPARISON:  Prior study from 06/24/2023  FINDINGS: No acute fracture or dislocation. Femoral head in normal alignment with the acetabulum. Femoral head height maintained. Bony pelvis intact. SI joints  symmetric and normal. Limited views of the left hip demonstrate no acute finding.  No visible soft tissue abnormality.  Postoperative changes noted within the visualized lower lumbar spine.  IMPRESSION: No acute osseous abnormality about the right hip.   Electronically Signed By: Morene Hoard M.D. On: 04/04/2024 04:01  Hip-L DG 2-3 views: No results found for this or any previous visit.  Hip-R DG Arthrogram: No results found for this or any previous visit.  Hip-L DG Arthrogram: No results found for this or any previous visit.  Hip-B DG Bilateral: No results found for this or any previous visit.  Hip-B DG Bilateral (5V): No results found for this or any previous visit.   Knee Imaging: Knee-R MR w contrast: No results found for this or any previous visit.  Knee-L MR w/o contrast: No results found for this or any previous visit.  Knee-R MR w/wo contrast: No results found for this or any previous visit.  Knee-L MR w/wo contrast: No results found for this or any previous visit.  Knee-R MR wo contrast: No results found for this or any previous visit.  Knee-L MR wo contrast: No results found for this or any previous visit.  Knee-R CT w contrast: No results found for this or any previous visit.  Knee-L CT w contrast: No results found for this or any previous visit.  Knee-R CT w/wo contrast: No results found for this or any previous visit.  Knee-L CT w/wo contrast: No results found for this or any previous visit.  Knee-R CT wo  contrast: No results found for this or any previous visit.  Knee-L CT wo contrast: No results found for this or any previous visit.  Knee-R DG 1-2 views: Results for orders placed during the hospital encounter of 04/04/24  DG Knee 2 Views Right  Narrative CLINICAL DATA:  Initial evaluation for acute pain status post fall.  EXAM: RIGHT KNEE - 1-2 VIEW  COMPARISON:  None Available.  FINDINGS: No acute fracture or dislocation. No joint effusion. Degenerative enthesophyte present at the superior pole the patella. Joint spaces otherwise fairly well maintained. Osseous mineralization normal. No visible soft tissue abnormality.  IMPRESSION: No acute osseous abnormality about the right knee.   Electronically Signed By: Morene Hoard M.D. On: 04/04/2024 04:02  Knee-L DG 1-2 views: No results found for this or any previous visit.  Knee-R DG 3 views: No results found for this or any previous visit.  Knee-L DG 3 views: No results found for this or any previous visit.  Knee-R DG 4 views: No results found for this or any previous visit.  Knee-L DG 4 views: No results found for this or any previous visit.  Knee-R DG Arthrogram: No results found for this or any previous visit.  Knee-L DG Arthrogram: No results found for this or any previous visit.   Ankle Imaging: Ankle-R DG Complete: No results found for this or any previous visit.  Ankle-L DG Complete: No results found for this or any previous visit.   Foot Imaging: Foot-R DG Complete: No results found for this or any previous visit.  Foot-L DG Complete: No results found for this or any previous visit.   Elbow Imaging: Elbow-R DG Complete: No results found for this or any previous visit.  Elbow-L DG Complete: Results for orders placed during the hospital encounter of 08/28/21  DG Elbow Complete Left  Narrative CLINICAL DATA:  Fall with elbow trauma.  EXAM: LEFT ELBOW - COMPLETE 3+ VIEW  COMPARISON:  None.  FINDINGS: There is dorsal skin laceration and swelling at the level of the olecranon, positive anterior and posterior supracondylar fat pad signs, and an acute transverse oblique fracture of the dorsal base of the olecranon process of the ulna.  There is no further evidence of fractures. Arthritic changes are not seen.  There is normal bone mineralization. No radiopaque foreign body is evident.  IMPRESSION: Transverse oblique intra-articular nondisplaced fracture of the dorsal base of the proximal ulnar olecranon process, with joint effusion and dorsal overlying laceration.   Electronically Signed By: Francis Quam M.D. On: 08/28/2021 02:02   Wrist Imaging: Wrist-R DG Complete: No results found for this or any previous visit.  Wrist-L DG Complete: No results found for this or any previous visit.   Hand Imaging: Hand-R DG Complete: No results found for this or any previous visit.  Hand-L DG Complete: No results found for this or any previous visit.   Complexity Note: Imaging results reviewed.                         ROS  Cardiovascular: {Hx; Cardiovascular History:210120525} Pulmonary or Respiratory: {Hx; Pumonary and/or Respiratory History:210120523} Neurological: {Hx; Neurological:210120504} Psychological-Psychiatric: {Hx; Psychological-Psychiatric History:210120512} Gastrointestinal: {Hx; Gastrointestinal:210120527} Genitourinary: {Hx; Genitourinary:210120506} Hematological: {Hx; Hematological:210120510} Endocrine: {Hx; Endocrine history:210120509} Rheumatologic: {Hx; Rheumatological:210120530} Musculoskeletal: {Hx; Musculoskeletal:210120528} Work History: {Hx; Work history:210120514}  Allergies  Justin Nixon is allergic to penicillins.  Laboratory Chemistry Profile   Renal Lab Results  Component Value Date   BUN 17 03/19/2023   CREATININE 1.11 03/19/2023   GFRNONAA >60 03/19/2023   SPECGRAV 1.025 03/16/2024   PHUR 6.0 03/16/2024   PROTEINUR  Negative 03/16/2024     Electrolytes Lab Results  Component Value Date   NA 139 03/19/2023   K 3.9 03/19/2023   CL 103 03/19/2023   CALCIUM  8.9 03/19/2023     Hepatic No results found for: AST, ALT, ALBUMIN, ALKPHOS, AMYLASE, LIPASE, AMMONIA   ID Lab Results  Component Value Date   HIV Non Reactive 08/28/2021   SARSCOV2NAA NEGATIVE 08/28/2021   STAPHAUREUS NEGATIVE 03/19/2023   MRSAPCR NEGATIVE 03/19/2023     Bone No results found for: VD25OH, CI874NY7UNU, CI6874NY7, CI7874NY7, 25OHVITD1, 25OHVITD2, 25OHVITD3, TESTOFREE, TESTOSTERONE   Endocrine Lab Results  Component Value Date   GLUCOSE 84 03/19/2023   GLUCOSEU Negative 03/16/2024     Neuropathy Lab Results  Component Value Date   HIV Non Reactive 08/28/2021     CNS No results found for: COLORCSF, APPEARCSF, RBCCOUNTCSF, WBCCSF, POLYSCSF, LYMPHSCSF, EOSCSF, PROTEINCSF, GLUCCSF, JCVIRUS, CSFOLI, IGGCSF, LABACHR, ACETBL   Inflammation (CRP: Acute  ESR: Chronic) No results found for: CRP, ESRSEDRATE, LATICACIDVEN   Rheumatology No results found for: RF, ANA, LABURIC, URICUR, LYMEIGGIGMAB, LYMEABIGMQN, HLAB27   Coagulation Lab Results  Component Value Date   INR 0.9 08/28/2021   LABPROT 12.3 08/28/2021   PLT 304 03/19/2023     Cardiovascular Lab Results  Component Value Date   HGB 11.6 (L) 04/01/2023   HCT 42.6 03/19/2023     Screening Lab Results  Component Value Date   SARSCOV2NAA NEGATIVE 08/28/2021   STAPHAUREUS NEGATIVE 03/19/2023   MRSAPCR NEGATIVE 03/19/2023   HIV Non Reactive 08/28/2021     Cancer No results found for: CEA, CA125, LABCA2   Allergens No results found for: ALMOND, APPLE, ASPARAGUS, AVOCADO, BANANA, BARLEY, BASIL, BAYLEAF, GREENBEAN, LIMABEAN, WHITEBEAN, BEEFIGE, REDBEET, BLUEBERRY, BROCCOLI, CABBAGE, MELON, CARROT, CASEIN, CASHEWNUT, CAULIFLOWER,  CELERY     Note: Lab results reviewed.  PFSH  Drug: Justin Nixon  reports no history of drug use. Alcohol:  reports that he does not currently use alcohol. Tobacco:  reports that he has been smoking cigarettes. He started smoking about 17 months ago. He has a 11 pack-year smoking history. He has never used smokeless tobacco. Medical:  has a past medical history of Anxiety, Arthritis, GERD (gastroesophageal reflux disease), Hyperlipidemia, Hypertension, and Schizophrenia (HCC). Family: family history is not on file.  Past Surgical History:  Procedure Laterality Date   ANTERIOR LATERAL LUMBAR FUSION WITH PERCUTANEOUS SCREW 1 LEVEL N/A 11/18/2021   Procedure: L4-5 LATERAL LUMBAR INTERBODY FUSION WITH POSTERIOR SPINAL FUSION;  Surgeon: Clois Fret, MD;  Location: [facility];  Service: Neurosurgery;  Laterality: N/A;   ANTERIOR LUMBAR FUSION N/A 03/30/2023   Procedure: L3-4 LATERAL LUMBAR INTERBODY FUSION;  Surgeon: Clois Fret, MD;  Location: [facility];  Service: Neurosurgery;  Laterality: N/A;   APPLICATION OF INTRAOPERATIVE CT SCAN N/A 11/18/2021   Procedure: APPLICATION OF INTRAOPERATIVE CT SCAN;  Surgeon: Clois Fret, MD;  Location: [facility];  Service: Neurosurgery;  Laterality: N/A;   APPLICATION OF INTRAOPERATIVE CT SCAN N/A 03/30/2023   Procedure: APPLICATION OF INTRAOPERATIVE CT SCAN;  Surgeon: Clois Fret, MD;  Location: [facility];  Service: Neurosurgery;  Laterality: N/A;   COLONOSCOPY     CYST EXCISION     ELBOW ARTHROSCOPY Left 10/10/2020   Procedure: LEFT ELBOW ARTHROSCOPY WITH DEBRIDEMENT AND EXCISION OF LOOSE BODIES;  Surgeon: Edie Norleen PARAS, MD;  Location: [facility];  Service: Orthopedics;  Laterality: Left;   MYRINGOTOMY WITH TUBE PLACEMENT     ORIF ELBOW FRACTURE Left 08/28/2021   Procedure: OPEN REDUCTION INTERNAL FIXATION (ORIF) ELBOW/OLECRANON FRACTURE;  Surgeon: Edie Norleen PARAS, MD;  Location: [facility];  Service: Orthopedics;  Laterality: Left;   SHOULDER  ARTHROSCOPY Left 03/18/2017   Procedure: ARTHROSCOPY SHOULDER with debridement decompression and distal clavicle excision;  Surgeon: Edie Norleen PARAS, MD;  Location: [facility];  Service: Orthopedics;  Laterality: Left;   SHOULDER ARTHROSCOPY WITH OPEN ROTATOR CUFF REPAIR Right 09/23/2016   Procedure: SHOULDER ARTHROSCOPY WITH OPEN ROTATOR CUFF REPAIR;  Surgeon: Norleen PARAS Edie, MD;  Location: [facility];  Service: Orthopedics;  Laterality: Right;   SHOULDER ARTHROSCOPY WITH SUBACROMIAL DECOMPRESSION Right 09/23/2016   Procedure: SHOULDER ARTHROSCOPY WITH SUBACROMIAL DECOMPRESSION AND DEBRIDEMENT;  Surgeon: Norleen PARAS Edie, MD;  Location: [facility];  Service: Orthopedics;  Laterality: Right;   Active Ambulatory Problems    Diagnosis Date Noted   Olecranon fracture, open type I or II, initial encounter (Left) 08/28/2021   S/P spinal fusion 11/18/2021   Chronic low back pain (Bilateral) w/ sciatica (Bilateral) 03/30/2023   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 03/30/2023   S/P lumbar fusion 03/30/2023   Abnormal results of liver function studies 10/12/2008   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   Complete tear of rotator cuff (Right) 07/21/2016   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) 03/09/2017   Degenerative tear of glenoid labrum of shoulder (Right) 09/29/2016   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   Elevated blood pressure reading 02/27/2010   Essential hypertension 06/01/2020   Finger, open wounds with tendon injury, initial encounter 10/27/2021   Injury of digital nerve of finger 10/27/2021   Lateral epicondylitis, elbow (Left) 09/03/2020   Numbness 10/27/2021   Paranoid schizophrenia (HCC) 10/16/2017   Plica syndrome of elbow (Left) 10/11/2020   Rotator cuff tendinitis (Left) 02/13/2017   Rotator cuff tendinitis (Right)  07/21/2016   Tobacco use 07/18/2021   Open displaced intra-articular fracture of  olecranon process of ulna (Left) 08/28/2021   Chronic pain syndrome 07/05/2024   Pharmacologic therapy 07/05/2024   Disorder of skeletal system 07/05/2024   Problems influencing health status 07/05/2024   Abnormal MRI, lumbar spine (02/12/2024) 07/05/2024   Resolved Ambulatory Problems    Diagnosis Date Noted   No Resolved Ambulatory Problems   Past Medical History:  Diagnosis Date   Anxiety    Arthritis    GERD (gastroesophageal reflux disease)    Hyperlipidemia    Hypertension    Schizophrenia (HCC)    Constitutional Exam  General appearance: Well nourished, well developed, and well hydrated. In no apparent acute distress There were no vitals filed for this visit. BMI Assessment: Estimated body mass index is 22.38 kg/m as calculated from the following:   Height as of 06/07/24: 6' (1.829 m).   Weight as of 06/07/24: 165 lb (74.8 kg).  BMI interpretation table: BMI level Category Range association with higher incidence of chronic pain  <18 kg/m2 Underweight   18.5-24.9 kg/m2 Ideal body weight   25-29.9 kg/m2 Overweight Increased incidence by 20%  30-34.9 kg/m2 Obese (Class I) Increased incidence by 68%  35-39.9 kg/m2 Severe obesity (Class II) Increased incidence by 136%  >40 kg/m2 Extreme obesity (Class III) Increased incidence by 254%   Patient's current BMI Ideal Body weight  There is no height or weight on file to calculate BMI. Patient weight not recorded   BMI Readings from Last 4 Encounters:  06/07/24 22.38 kg/m  04/14/24 22.38 kg/m  04/06/24 22.38 kg/m  04/04/24 22.38 kg/m   Wt Readings from Last 4 Encounters:  06/07/24 165 lb (74.8 kg)  04/14/24 165 lb (74.8 kg)  04/06/24 165 lb (74.8 kg)  04/04/24 165 lb (74.8 kg)    Psych/Mental status: Alert, oriented x 3 (person, place, & time)       Eyes: PERLA Respiratory: No evidence of acute respiratory distress  Assessment  Primary Diagnosis & Pertinent Problem List: The primary encounter diagnosis was  Chronic pain syndrome. Diagnoses of Pharmacologic therapy, Disorder of skeletal system, Problems influencing health status, and Abnormal MRI, lumbar spine (02/12/2024) were also pertinent to this visit.  Visit Diagnosis (New problems to examiner): 1. Chronic pain syndrome   2. Pharmacologic therapy   3. Disorder of skeletal system   4. Problems influencing health status   5. Abnormal MRI, lumbar spine (02/12/2024)    Plan of Care (Initial workup plan)  Note: Justin Nixon was reminded that as per protocol, today's visit has been an evaluation only. We have not taken over the patient's controlled substance management.  Problem-specific plan: Assessment and Plan            Lab Orders  No laboratory test(s) ordered today   Imaging Orders  No imaging studies ordered today   Referral Orders  No referral(s) requested today   Procedure Orders    No procedure(s) ordered today   Pharmacotherapy (current): Medications ordered:  No orders of the defined types were placed in this encounter.  Medications administered during this visit: Collyn L. Woehrle had no medications administered during this visit.   Analgesic Pharmacotherapy:  Opioid Analgesics: For patients currently taking or requesting to take opioid analgesics, in accordance with [place]  Medical Board Guidelines, we will assess their risks and indications for the use of these substances. After completing our evaluation, we may offer recommendations, but we no longer take patients for medication  management. The prescribing physician will ultimately decide, based on his/her training and level of comfort whether to adopt any of the recommendations, including whether or not to prescribe such medicines.  Membrane stabilizer: To be determined at a later time  Muscle relaxant: To be determined at a later time  NSAID: To be determined at a later time  Other analgesic(s): To be determined at a later time   Interventional management  options: Justin Nixon was informed that there is no guarantee that he would be a candidate for interventional therapies. The decision will be based on the results of diagnostic studies, as well as Justin Nixon risk profile.  Procedure(s) under consideration:  Pending results of ordered studies     Interventional Therapies  Risk Factors  Considerations  Medical Comorbidities:     Planned  Pending:      Under consideration:   Pending   Completed: (Analgesic benefit)1  None at this time   Therapeutic  Palliative (PRN) options:   None established   Completed by other providers:   None reported  1(Analgesic benefit): Expressed in percentage (%). (Local anesthetic[LA] +/- sedation  L.A.Local Anesthetic  Steroid benefit  Ongoing benefit)   Provider-requested follow-up: No follow-ups on file.  Future Appointments  Date Time Provider Department Center  07/06/2024  9:00 AM Tanya Glisson, MD [facility] None   I discussed the assessment and treatment plan with the patient. The patient was provided an opportunity to ask questions and all were answered. The patient agreed with the plan and demonstrated an understanding of the instructions.  Patient advised to call back or seek an in-person evaluation if the symptoms or condition worsens.  Duration of encounter: *** minutes.  Total time on encounter, as per AMA guidelines included both the face-to-face and non-face-to-face time personally spent by the physician and/or other qualified health care professional(s) on the day of the encounter (includes time in activities that require the physician or other qualified health care professional and does not include time in activities normally performed by clinical staff). Physician's time may include the following activities when performed: Preparing to see the patient (e.g., pre-charting review of records, searching for previously ordered imaging, lab work, and nerve conduction tests) Review  of prior analgesic pharmacotherapies. Reviewing PMP Interpreting ordered tests (e.g., lab work, imaging, nerve conduction tests) Performing post-procedure evaluations, including interpretation of diagnostic procedures Obtaining and/or reviewing separately obtained history Performing a medically appropriate examination and/or evaluation Counseling and educating the patient/family/caregiver Ordering medications, tests, or procedures Referring and communicating with other health care professionals (when not separately reported) Documenting clinical information in the electronic or other health record Independently interpreting results (not separately reported) and communicating results to the patient/ family/caregiver Care coordination (not separately reported)  Note by: Glisson DELENA Tanya, MD (TTS and AI technology used. I apologize for any typographical errors that were not detected and corrected.) Date: 07/06/2024; Time: 3:29 PM

## 2024-07-05 NOTE — Patient Instructions (Incomplete)

## 2024-07-06 ENCOUNTER — Encounter: Payer: Self-pay | Admitting: Pain Medicine

## 2024-07-06 ENCOUNTER — Ambulatory Visit: Attending: Pain Medicine | Admitting: Pain Medicine

## 2024-07-06 VITALS — BP 136/102 | HR 113 | Temp 97.5°F | Resp 16 | Ht 72.0 in | Wt 165.0 lb

## 2024-07-06 DIAGNOSIS — M5417 Radiculopathy, lumbosacral region: Secondary | ICD-10-CM | POA: Insufficient documentation

## 2024-07-06 DIAGNOSIS — R937 Abnormal findings on diagnostic imaging of other parts of musculoskeletal system: Secondary | ICD-10-CM | POA: Diagnosis present

## 2024-07-06 DIAGNOSIS — G894 Chronic pain syndrome: Secondary | ICD-10-CM | POA: Diagnosis present

## 2024-07-06 DIAGNOSIS — G8929 Other chronic pain: Secondary | ICD-10-CM | POA: Insufficient documentation

## 2024-07-06 DIAGNOSIS — Z981 Arthrodesis status: Secondary | ICD-10-CM | POA: Diagnosis present

## 2024-07-06 DIAGNOSIS — Z789 Other specified health status: Secondary | ICD-10-CM | POA: Insufficient documentation

## 2024-07-06 DIAGNOSIS — M5442 Lumbago with sciatica, left side: Secondary | ICD-10-CM | POA: Insufficient documentation

## 2024-07-06 DIAGNOSIS — R208 Other disturbances of skin sensation: Secondary | ICD-10-CM | POA: Insufficient documentation

## 2024-07-06 DIAGNOSIS — Z79899 Other long term (current) drug therapy: Secondary | ICD-10-CM | POA: Insufficient documentation

## 2024-07-06 DIAGNOSIS — M79604 Pain in right leg: Secondary | ICD-10-CM | POA: Diagnosis present

## 2024-07-06 DIAGNOSIS — M79605 Pain in left leg: Secondary | ICD-10-CM | POA: Diagnosis present

## 2024-07-06 DIAGNOSIS — M961 Postlaminectomy syndrome, not elsewhere classified: Secondary | ICD-10-CM | POA: Diagnosis present

## 2024-07-06 DIAGNOSIS — M899 Disorder of bone, unspecified: Secondary | ICD-10-CM | POA: Insufficient documentation

## 2024-07-06 DIAGNOSIS — M5441 Lumbago with sciatica, right side: Secondary | ICD-10-CM | POA: Diagnosis present

## 2024-07-06 DIAGNOSIS — G96198 Other disorders of meninges, not elsewhere classified: Secondary | ICD-10-CM | POA: Insufficient documentation

## 2024-07-06 DIAGNOSIS — M5410 Radiculopathy, site unspecified: Secondary | ICD-10-CM | POA: Insufficient documentation

## 2024-07-06 NOTE — Progress Notes (Signed)
 Safety precautions to be maintained throughout the outpatient stay will include: orient to surroundings, keep bed in low position, maintain call bell within reach at all times, provide assistance with transfer out of bed and ambulation.

## 2024-07-10 LAB — COMPLIANCE DRUG ANALYSIS, UR

## 2024-07-12 ENCOUNTER — Telehealth: Payer: Self-pay

## 2024-07-12 NOTE — Addendum Note (Signed)
 Addended by: Tyreke Kaeser A on: 07/12/2024 06:01 PM   Modules accepted: Orders

## 2024-07-12 NOTE — Telephone Encounter (Signed)
 I received his pysch eval. He has had the MRI's done. I need for you to put in an order for the SCS trial so I can get prior auth.

## 2024-07-13 LAB — COMP. METABOLIC PANEL (12)
AST: 57 IU/L — ABNORMAL HIGH (ref 0–40)
Albumin: 4.6 g/dL (ref 4.1–5.1)
Alkaline Phosphatase: 92 IU/L (ref 47–123)
BUN/Creatinine Ratio: 7 — ABNORMAL LOW (ref 9–20)
BUN: 7 mg/dL (ref 6–24)
Bilirubin Total: 0.4 mg/dL (ref 0.0–1.2)
Calcium: 9.5 mg/dL (ref 8.7–10.2)
Chloride: 103 mmol/L (ref 96–106)
Creatinine, Ser: 0.99 mg/dL (ref 0.76–1.27)
Globulin, Total: 2.9 g/dL (ref 1.5–4.5)
Glucose: 109 mg/dL — ABNORMAL HIGH (ref 70–99)
Potassium: 4 mmol/L (ref 3.5–5.2)
Sodium: 145 mmol/L — ABNORMAL HIGH (ref 134–144)
Total Protein: 7.5 g/dL (ref 6.0–8.5)
eGFR: 94 mL/min/1.73 (ref >59)

## 2024-07-13 LAB — 25-HYDROXY VITAMIN D LCMS D2+D3
25-Hydroxy, Vitamin D-2: <1 ng/mL
25-Hydroxy, Vitamin D-3: 15 ng/mL
25-Hydroxy, Vitamin D: 15 ng/mL — AB

## 2024-07-13 LAB — MAGNESIUM: Magnesium: 2.1 mg/dL (ref 1.6–2.3)

## 2024-07-13 LAB — C-REACTIVE PROTEIN: CRP: 3 mg/L (ref 0–10)

## 2024-07-13 LAB — SEDIMENTATION RATE: Sed Rate: 18 mm/h — ABNORMAL HIGH (ref 0–15)

## 2024-07-13 LAB — VITAMIN B12: Vitamin B-12: 979 pg/mL (ref 232–1245)

## 2024-07-13 NOTE — Telephone Encounter (Signed)
 Per Orie, All SCS trials to wait until after the holidays. I will submit for auth after Christmas.

## 2024-08-29 DIAGNOSIS — Z880 Allergy status to penicillin: Secondary | ICD-10-CM | POA: Insufficient documentation

## 2024-08-29 NOTE — Patient Instructions (Signed)
___________________________________________________________________________________________ ? ?PATIENT DISCHARGE INSTRUCTIONS FOLLOWING SPINAL CORD STIMULATOR TRIAL IMPLANT ? ?You will be discharged from the clinic on the same day as your surgery, after you are fully recovered. Make ?arrangements to be driven home. DO NOT DRIVE YOURSELF!! ? ?The bandage over the side incision may drain a small amount of blood. This is normal; don?t be alarmed. ?Be certain to review all warnings/precautions in the patient brochure accompanying your stimulator. ?Please call the Pain Clinic to make an appointment for follow?up care. ? ?Instructions: ?Food intake: Start with clear liquids (like water) and advance to regular food, as tolerated.  ?Physical activities:  ?No restrictions during the trial period except for submerging in water above the implant area. ?Driving: You can start to drive again when you are fully recovered from the effects of the sedation (24 hours). ?Blood thinner: If you take a blood thinner, restart it 6 hours after your procedure. (Only for those taking blood thinners) ?Insulin: If you use insulin, as soon as you can eat, you may resume your normal dosing schedule. (Only for those taking insulin) ?Wound dressing care: Keep procedure site clean and dry. Clean wound with alcohol, once a day for the first 7 days, starting 48 hours after the surgery.  ?Keep incisions clean and dry. Cover with plastic wrap and tape when showering. Do not submerge incisions in a bathtub, pool or spa. ?Follow-up appointment: Keep your follow-up appointment after the procedure. Usually 6-7 days to remove trial leads.  ? ?Expect: ?From numbing medicine (AKA: Local Anesthetics): Numbness or decrease in pain. ?Duration: On the average, 1 to 8 hours.  ?From procedure: Some discomfort is to be expected once the numbing medicine wears off. This should be minimal. Use your regular pain medicines.  ? ?Call if: ?You experience numbness and  weakness that gets worse with time, as opposed to wearing off. ?New onset bowel or bladder incontinence. ?develop a temperature over 101.5? ?are unable to urinate ?have increasing difficulty walking or using your legs ?have incisional pain that continues to increase rather than stabilize or improve ?have any drainage of pus from your incisions ?have drainage which completely saturates the bandage ? ?Emergency Numbers: ?Durning business hours (Monday - Thursday, 8:00 AM - 4:00 PM) (Friday, 9:00 AM - 12:00 Noon): [phone] ?After hours: [phone] ?____________________________________________________________________________________________ ?  ?

## 2024-08-29 NOTE — Progress Notes (Unsigned)
 PROVIDER NOTE: Interpretation of information contained herein should be left to medically-trained personnel. Specific patient instructions are provided elsewhere under Patient Instructions section of medical record. This document was created in part using STT-dictation technology, any transcriptional errors that may result from this process are unintentional.  Patient: Justin Nixon Type: Established DOB: 10/24/1975 MRN: [id number] PCP: Rudolpho Norleen BIRCH, MD  Service: Procedure DOS: 08/30/2024 Setting: Ambulatory Location: Ambulatory outpatient facility Delivery: Face-to-face Provider: Eric DELENA Como, MD Specialty: Interventional Pain Management Specialty designation: 09 Location: Outpatient facility Ref. Prov.: Rudolpho Norleen BIRCH, MD       Interventional Therapy   Primary Reason for Admission: Surgical management of chronic pain condition.   Procedure:              Type: Trial Spinal Cord Neurostimulator Implant (Percutaneous, interlaminar, posterior epidural placement) Laterality: Bilateral (-50)  Level: Lumbar  Imaging: Fluoroscopic guidance Anesthesia: Local anesthesia (1-2% Lidocaine ) Anxiolysis: IV Versed   1.0 mg           Analgesia: No Sedation None required. No Fentanyl  administered.         DOS: 08/30/2024  Performed by: Eric DELENA Como, MD  Purpose: Diagnostic. To determine if a permanent implant may be effective in controlling some or all of [name] chronic pain symptoms.  Indications: Bilateral lower extremity pain severe enough to impact quality of life or function. Rationale (medical necessity): procedure needed and proper for the diagnosis and/or treatment of [name]'s medical symptoms and needs. 1. Chronic low back pain (1ry area of Pain) (Bilateral) (R>L) w/ sciatica (Bilateral)   2. Chronic lower extremity pain (2ry area of Pain) (Bilateral) (R>L)   3. 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 Failed back surgical  syndrome   5. Epidural fibrosis   6. Lumbosacral radiculopathy at S1 (Bilateral)   7. 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 Abnormal MRI, lumbar spine (02/12/2024)   9. History of penicillin allergy   10. Encounter for therapeutic procedure    NAS-11 Pain score:   Pre-procedure: 7 /10   Post-procedure: 7 /10     Target: Posterior epidural space over the dorsal columns of the spinal cord. Location: Posterior intraspinal canal Region: Lumbar  Approach: Translaminar percutaneous  Type of procedure: Surgical   Position / Prep / Materials:  Position: Prone  Prep solution: ChloraPrep (2% chlorhexidine  gluconate and 70% isopropyl alcohol) Prep Area: Entire Posterior Thoracolumbar Region  Materials:  Tray: Implant tray        Needle(s):  Type: Epidural  Gauge (G): 17  Length: 3.5-in  Qty: 2  H&P (Pre-op Assessment):  [name] is a 49 y.o. (year old), male patient, seen today for interventional treatment. He  has a past surgical history that includes Cyst excision; Myringotomy with tube placement; Shoulder arthroscopy with open rotator cuff repair (Right, 09/23/2016); Shoulder arthroscopy with subacromial decompression (Right, 09/23/2016); Shoulder arthroscopy (Left, 03/18/2017); Colonoscopy; Elbow arthroscopy (Left, 10/10/2020); ORIF elbow fracture (Left, 08/28/2021); Anterior lateral lumbar fusion with percutaneous screw 1 level (N/A, 11/18/2021); Application of intraoperative CT scan (N/A, 11/18/2021); Anterior lumbar fusion (N/A, 03/30/2023); and Application of intraoperative CT scan (N/A, 03/30/2023).  Initial Vital Signs:  Pulse/EKG Rate: 73ECG Heart Rate: 70 Temp: 98.3 F (36.8 C) Resp: 18 BP: (!) 134/92 SpO2: 98 %  BMI: Estimated body mass index is 22.38 kg/m as calculated from the following:   Height as of this encounter: 6' (1.829 m).   Weight as of this encounter:  165 lb (74.8 kg).  Risk Assessment: Allergies: Reviewed. He is allergic to  penicillins.  Allergy Precautions: None required Coagulopathies: Reviewed. None identified.  Blood-thinner therapy: None at this time Active Infection(s): Reviewed. None identified. [name] is afebrile  Site Confirmation: [name] was asked to confirm the procedure and laterality before marking the site, which he did. Procedure checklist: Completed Consent: Before the procedure and under the influence of no sedative(s), amnesic(s), or anxiolytics, the patient was informed of the treatment options, risks and possible complications. To fulfill our ethical and legal obligations, as recommended by the American Medical Association's Code of Ethics, I have informed the patient of my clinical impression; the nature and purpose of the treatment or procedure; the risks, benefits, and possible complications of the intervention; the alternatives, including doing nothing; the risk(s) and benefit(s) of the alternative treatment(s) or procedure(s); and the risk(s) and benefit(s) of doing nothing.  [name] was provided with information about the general risks and possible complications associated with most interventional procedures. These include, but are not limited to: failure to achieve desired goals, infection, bleeding, organ or nerve damage, allergic reactions, paralysis, and/or death.  In addition, he was informed of those risks and possible complications associated to this particular procedure, which include, but are not limited to: damage to the implant; failure to decrease pain; local, systemic, or serious CNS infections, intraspinal abscess with possible cord compression and paralysis, or life-threatening such as meningitis; intrathecal and/or epidural bleeding with formation of hematoma with possible spinal cord compression and permanent paralysis; organ damage; nerve injury or damage with subsequent sensory, motor, and/or autonomic system dysfunction, resulting in transient or permanent pain,  numbness, and/or weakness of one or several areas of the body; allergic reactions, either minor or major life-threatening, such as anaphylactic or anaphylactoid reactions.  Furthermore, [name] was informed of those risks and complications associated with the medications. These include, but are not limited to: allergic reactions (i.e.: anaphylactic or anaphylactoid reactions); arrhythmia;  Hypotension/hypertension; cardiovascular collapse; respiratory depression and/or shortness of breath; swelling or edema; medication-induced neural toxicity; particulate matter embolism and blood vessel occlusion with resultant organ, and/or nervous system infarction and permanent paralysis.  Finally, he was informed that Medicine is not an exact science; therefore, there is also the possibility of unforeseen or unpredictable risks and/or possible complications that may result in a catastrophic outcome. The patient indicated having understood very clearly. We have given the patient no guarantees and we have made no promises. Enough time was given to the patient to ask questions, all of which were answered to the patient's satisfaction. Mr. Ancheta has indicated that he wanted to continue with the procedure. Attestation: I, the ordering provider, attest that I have discussed with the patient the benefits, risks, side-effects, alternatives, likelihood of achieving goals, and potential problems during recovery for the procedure that I have provided informed consent. Date  Time: 08/30/2024  7:59 AM  Pre-Procedure Preparation:  Monitoring: As per clinic protocol. Respiration, ETCO2, SpO2, BP, heart rate and rhythm monitor placed and checked for adequate function Safety Precautions: Patient was assessed for positional comfort and pressure points before starting the procedure. Time-out: I initiated and conducted the Time-out before starting the procedure, as per protocol. The patient was asked to participate by confirming  the accuracy of the Time Out information. Verification of the correct person, site, and procedure were performed and confirmed by me, the nursing staff, and the patient. Time-out conducted as per Joint Commission's Universal Protocol (UP.01.01.01). Time: 72  Start Time: 0840 hrs.  Description/Narrative of Procedure:          Rationale (medical necessity): procedure needed and proper for the diagnosis and/or treatment of the patient's medical symptoms and needs. Procedural Technique Safety Precautions: Aspiration looking for blood return was conducted prior to all injections. At no point did we inject any substances, as a needle was being advanced. No attempts were made at seeking any paresthesias. Safe injection practices and needle disposal techniques used. Medications properly checked for expiration dates. SDV (single dose vial) medications used. Description of the Procedure: Protocol guidelines were followed. The patient was assisted into a comfortable position. The target area was identified and the area prepped in the usual manner. Skin & deeper tissues infiltrated with local anesthetic. Appropriate amount of time allowed to pass for local anesthetics to take effect. The procedure needles were then advanced to the target area. Proper needle placement secured. Negative aspiration confirmed. Solution injected in intermittent fashion, asking for systemic symptoms every 0.5cc of injectate. The needles were then removed and the area cleansed, making sure to leave some of the prepping solution back to take advantage of its long term bactericidal properties.  Technical description of procedure: Availability of a responsible, adult driver, and NPO status confirmed. Informed consent was obtained after having discussed risks and possible complications. An IV was started. The patient was then taken to the fluoroscopy suite, where the patient was placed in position for the procedure, over the fluoroscopy  table. The patient was then monitored in the usual manner. Fluoroscopy was manipulated to obtain the best possible view of the target. Parallex error was corrected before commencing the procedure. Once a clear view of the target had been obtained, the skin and deeper tissues over the procedure site were infiltrated using lidocaine , loaded in a 10 cc luer-loc syringe with a 0.5 inch, 25-G needle. The introducer needle(s) was/were then inserted through the skin and deeper tissues. A paramidline approach was used to enter the posterior epidural space at a 30 angle, using Loss-of-resistance Technique with 3 ml of PF-NaCl (0.9% NSS). Correct needle placement was confirmed in the antero-posterior and lateral fluoroscopic views. The lead was gently introduced and manipulated under real-time fluoroscopy, constantly assessing for pain, discomfort, or paresthesias, until the tip rested at the desired level. Both sides were done in identical fashion. Electrode placement was tested until appropriate coverage was attained. Once the patient confirmed that the stimulation was over the desired area, the lead(s) was/were secured in place and the introducer needles removed. This was done under real-time fluoroscopy while observing the electrode tip to avoid unintended migration. The area was covered with a non-occlusive dressing and the patient transported to recovery for further programming.  Vitals:   08/30/24 0925 08/30/24 0930 08/30/24 0935 08/30/24 0941  BP: (!) 128/98 (!) 134/97 (!) 136/96 (!) 148/93  Pulse:      Resp: 15 16 17 16   Temp:      SpO2: 100% 100% 100% 100%  Weight:      Height:        Start Time: 0840 hrs. End Time: 0927 (including taping) hrs.  Neurostimulator Details:  Lead(s):  Brand: Medtronic         Epidural Access Level:  L2-3         Lead implant:  Bilateral   No. of Electrodes/Lead:  8 8  Laterality:  Left Right  Location (Top electrode):  T8 T8  Location (Lower electrode):  T10  T10  Model No.:  [id number] Same  Length: 60 cm Same  Lot No.: [id number]  MRI compatibility:  Conditional           External Neurostimulator    Model No.: [id number]   Serial No.: [id number]    Imaging Guidance (Spinal):         Type of Imaging Technique: Fluoroscopy Guidance (Spinal) Indication(s): Fluoroscopy guidance for needle placement to enhance accuracy in procedures requiring precise needle localization for targeted delivery of medication in or near specific anatomical locations not easily accessible without such real-time imaging assistance. Exposure Time: Please see nurses notes. Contrast: None used. Fluoroscopic Guidance: I was personally present during the use of fluoroscopy. Tunnel Vision Technique used to obtain the best possible view of the target area. Parallax error corrected before commencing the procedure. Direction-depth-direction technique used to introduce the needle under continuous pulsed fluoroscopy. Once target was reached, antero-posterior, oblique, and lateral fluoroscopic projection used confirm needle placement in all planes. Images permanently stored in EMR.    Interpretation: No contrast injected. I personally interpreted the imaging intraoperatively. Adequate needle placement confirmed in multiple planes. Permanent images saved into the patient's record.  Antibiotic Prophylaxis:   Anti-infectives (From admission, onward)    Start     Dose/Rate Route Frequency Ordered Stop   08/30/24 0800  vancomycin  (VANCOCIN ) IVPB 1000 mg/200 mL premix        1,000 mg 200 mL/hr over 60 Minutes Intravenous  Once 08/30/24 0758 08/30/24 0950   08/30/24 0000  doxycycline  (VIBRA -TABS) 100 MG tablet  Status:  Discontinued        100 mg Oral 2 times daily 08/30/24 0757 08/30/24    08/30/24 0000  doxycycline  (VIBRA -TABS) 100 MG tablet        100 mg Oral 2 times daily 08/30/24 0824 09/09/24 2359      Indication(s): None identified  Post-operative Assessment:   Post-procedure Vital Signs:  Pulse/HCG Rate: 6766 Temp: 98.3 F (36.8 C) Resp: 16 BP: (!) 148/93 SpO2: 100 %  Complications: No immediate post-treatment complications observed by team, or reported by patient.  Note: The patient tolerated the entire procedure well. A repeat set of vitals were taken after the procedure and the patient was kept under observation following institutional policy, for this type of procedure. Post-procedural neurological assessment was performed, showing return to baseline, prior to discharge. The patient was provided with post-procedure discharge instructions, including a section on how to identify potential problems. Should any problems arise concerning this procedure, the patient was given instructions to immediately contact us , at any time, without hesitation. In any case, we plan to contact the patient by telephone for a follow-up status report regarding this interventional procedure.  Comments:  No additional relevant information.  Plan of Care Orders:  Orders Placed This Encounter  Procedures   DG [facility] C-ARM 1-60 MIN NO REPORT    Intraoperative interpretation by procedural physician at [facility].    Standing Status:   Standing    Number of Occurrences:   1    Reason for exam::   Assistance in needle guidance and placement for procedures requiring needle placement in or near specific anatomical locations not easily accessible without such assistance.   Informed Consent Details: Physician/Practitioner Attestation; Transcribe to consent form and obtain patient signature    Note: Always confirm laterality of pain with Mr. Neal, before procedure. Transcribe to consent form and obtain patient signature.    Scheduling Instructions:     CPT Code: PERCUTANEOUS IMPLANTATION OF NEUROSTIMULATOR  ELECTRODE ARRAY, EPIDURAL (36349)     ICD-10-CM Support Codes:    Physician/Practitioner attestation of informed consent for procedure/surgical case:    I, the physician/practitioner, attest that I have discussed with the patient the benefits, risks, side effects, alternatives, likelihood of achieving goals and potential problems during recovery for the procedure that I have provided informed consent.    Procedure:   Neurostimulator Trial    Physician/Practitioner performing the procedure:   Cully Luckow A. Ethelbert Thain, MD    Indication/Reason:   Regional Chronic Pain Syndrome   Provide equipment / supplies at bedside    Procedure tray: Epidural Tray (x1) Additional material(s):  1. Sterile towel pack (x2) 2. Small streight hemostat (x2) 3. Large hemostat (x1) 4. Sutures (cutting needle) 0-silk (x1) 5. Needle holder (x1) 6. Scissors (Mayo) (x1) 7. 3/4 Steri-strip pack (x2) 8. 4x4 Gauze Pack (x1) 9. Large sterile transparent dressings (Tegaderm) (x1)    Standing Status:   Standing    Number of Occurrences:   1    Specify:   Epidural Tray & Minor Surgery Tray   Follow-up    Post-procedure Phone Call: Call patient tomorrow for routine early follow-up evaluation.  Return Appointment Timeframe: Approximately 6-7 days, to remove Trial leads.    Standing Status:   Standing    Number of Occurrences:   1    Specify:   Schedule a return appointment for post-procedure evaluation. In addition arrange for patient to receive a follow-up phone call tomorrow to assess post-procedure status.   Lumbar spinal cord stimulator lead removal    Have suture removal kit available in room. Other equipment: Alcohol Prep; sterile gloves; sterile scissors (suture removal kit); Band-Aid; 4x4 gauze.    Scheduling Instructions:     Procedure: Lumbar spinal cord simulator lead removal     Laterality: N/A     Procedural Analgesia/Anxiolysis: Patient's choice     Timeframe:  08/30/2024   Saline lock IV    Have LR [id number] mL available and administer at 125 mL/hr if patient becomes hypotensive.    Standing Status:   Standing    Number of Occurrences:   1     Opioid  Analgesic: None MME/day: 0 mg/day    Medications administered: We administered lidocaine , pentafluoroprop-tetrafluoroeth, midazolam  PF, and vancomycin .  See the medical record for exact dosing, route, and time of administration.    Interventional Therapies  Risk Factors  Considerations  Medical Comorbidities:       Planned  Pending:   Diagnostic bilateral lumbar spinal cord stimulator trial    Under consideration:   Diagnostic bilateral lumbar spinal cord stimulator trial    Completed: (Analgesic benefit)1  None at this time   Therapeutic  Palliative (PRN) options:   None established   Completed by other providers:   Implant medical psychology evaluation performed.  Dx: No immediate contraindications.  1(Analgesic benefit): Expressed in percentage (%). (Local anesthetic[LA] +/- sedation  L.A.Local Anesthetic  Steroid benefit  Ongoing benefit)    Follow-up plan:   Return in about 1 week (around 09/06/2024) for (Face2F) Trail SCS removal.     Recent Visits Date Type Provider Dept  07/06/24 Office Visit Tanya Glisson, MD [facility]  Showing recent visits within past 90 days and meeting all other requirements Today's Visits Date Type Provider Dept  08/30/24 Procedure visit Tanya Glisson, MD [facility]  Showing today's visits and meeting all other requirements Future Appointments Date Type Provider Dept  09/06/24 Appointment Tanya Glisson, MD [facility]  Showing future appointments within next 90 days and meeting all other requirements  Disposition: Discharge home  Discharge (Date  Time): 08/30/2024; 1000 hrs.   Primary Care Physician: Rudolpho Norleen BIRCH, MD Location: [facility] Note by: Eric DELENA Como, MD (TTS technology used. I apologize for any typographical errors that were not detected and corrected.) Date: 08/30/2024; Time: 11:23 AM

## 2024-08-30 ENCOUNTER — Encounter: Payer: Self-pay | Admitting: Pain Medicine

## 2024-08-30 ENCOUNTER — Ambulatory Visit: Admitting: Pain Medicine

## 2024-08-30 ENCOUNTER — Ambulatory Visit
Admission: RE | Admit: 2024-08-30 | Discharge: 2024-08-30 | Disposition: A | Discharge status: Home / Self Care | Source: Ambulatory Visit | Attending: Pain Medicine | Admitting: Pain Medicine

## 2024-08-30 VITALS — BP 148/93 | HR 67 | Temp 98.3°F | Resp 16 | Ht 72.0 in | Wt 165.0 lb

## 2024-08-30 DIAGNOSIS — M79605 Pain in left leg: Secondary | ICD-10-CM | POA: Insufficient documentation

## 2024-08-30 DIAGNOSIS — G96198 Other disorders of meninges, not elsewhere classified: Secondary | ICD-10-CM

## 2024-08-30 DIAGNOSIS — M5442 Lumbago with sciatica, left side: Secondary | ICD-10-CM

## 2024-08-30 DIAGNOSIS — M5441 Lumbago with sciatica, right side: Secondary | ICD-10-CM | POA: Insufficient documentation

## 2024-08-30 DIAGNOSIS — R937 Abnormal findings on diagnostic imaging of other parts of musculoskeletal system: Secondary | ICD-10-CM | POA: Insufficient documentation

## 2024-08-30 DIAGNOSIS — Z5189 Encounter for other specified aftercare: Secondary | ICD-10-CM

## 2024-08-30 DIAGNOSIS — G8929 Other chronic pain: Secondary | ICD-10-CM | POA: Diagnosis not present

## 2024-08-30 DIAGNOSIS — Z880 Allergy status to penicillin: Secondary | ICD-10-CM

## 2024-08-30 DIAGNOSIS — R208 Other disturbances of skin sensation: Secondary | ICD-10-CM | POA: Insufficient documentation

## 2024-08-30 DIAGNOSIS — M5417 Radiculopathy, lumbosacral region: Secondary | ICD-10-CM | POA: Insufficient documentation

## 2024-08-30 DIAGNOSIS — M961 Postlaminectomy syndrome, not elsewhere classified: Secondary | ICD-10-CM

## 2024-08-30 DIAGNOSIS — M79604 Pain in right leg: Secondary | ICD-10-CM | POA: Insufficient documentation

## 2024-08-30 DIAGNOSIS — M5410 Radiculopathy, site unspecified: Secondary | ICD-10-CM | POA: Insufficient documentation

## 2024-08-30 MED ORDER — MIDAZOLAM HCL (PF) 2 MG/2ML IJ SOLN
0.5000 mg | Freq: Once | INTRAMUSCULAR | Status: AC
  Administered 2024-08-30: 1 mg via INTRAVENOUS
  Filled 2024-08-30: qty 2

## 2024-08-30 MED ORDER — FENTANYL CITRATE (PF) 100 MCG/2ML IJ SOLN
25.0000 ug | INTRAMUSCULAR | Status: DC | PRN
  Filled 2024-08-30: qty 2

## 2024-08-30 MED ORDER — LIDOCAINE HCL 2 % IJ SOLN
20.0000 mL | Freq: Once | INTRAMUSCULAR | Status: AC
  Administered 2024-08-30: 400 mg
  Filled 2024-08-30: qty 20

## 2024-08-30 MED ORDER — VANCOMYCIN HCL IN DEXTROSE 1-5 GM/200ML-% IV SOLN
1000.0000 mg | Freq: Once | INTRAVENOUS | Status: AC
  Administered 2024-08-30: 1000 mg via INTRAVENOUS
  Filled 2024-08-30: qty 200

## 2024-08-30 MED ORDER — DOXYCYCLINE HYCLATE 100 MG PO TABS: 100.0000 mg | ORAL_TABLET | Freq: Two times a day (BID) | ORAL | 0 refills | Status: DC

## 2024-08-30 MED ORDER — PENTAFLUOROPROP-TETRAFLUOROETH EX AERO: INHALATION_SPRAY | Freq: Once | CUTANEOUS | Status: AC

## 2024-08-30 NOTE — Progress Notes (Signed)
 Safety precautions to be maintained throughout the outpatient stay will include: orient to surroundings, keep bed in low position, maintain call bell within reach at all times, provide assistance with transfer out of bed and ambulation.

## 2024-08-31 ENCOUNTER — Telehealth: Payer: Self-pay | Admitting: *Deleted

## 2024-08-31 NOTE — Telephone Encounter (Signed)
 Post procedure call;  patient reports that he is doing good, no questions or concerns.

## 2024-09-06 ENCOUNTER — Ambulatory Visit: Admitting: Pain Medicine

## 2024-09-06 ENCOUNTER — Encounter: Payer: Self-pay | Admitting: Pain Medicine

## 2024-09-06 VITALS — BP 117/71 | HR 83 | Temp 98.2°F | Resp 16 | Ht 72.0 in | Wt 165.0 lb

## 2024-09-06 DIAGNOSIS — R937 Abnormal findings on diagnostic imaging of other parts of musculoskeletal system: Secondary | ICD-10-CM

## 2024-09-06 DIAGNOSIS — G96198 Other disorders of meninges, not elsewhere classified: Secondary | ICD-10-CM

## 2024-09-06 DIAGNOSIS — G8929 Other chronic pain: Secondary | ICD-10-CM

## 2024-09-06 DIAGNOSIS — Z981 Arthrodesis status: Secondary | ICD-10-CM

## 2024-09-06 DIAGNOSIS — Z880 Allergy status to penicillin: Secondary | ICD-10-CM

## 2024-09-06 DIAGNOSIS — M961 Postlaminectomy syndrome, not elsewhere classified: Secondary | ICD-10-CM

## 2024-09-06 DIAGNOSIS — Z09 Encounter for follow-up examination after completed treatment for conditions other than malignant neoplasm: Secondary | ICD-10-CM

## 2024-09-06 DIAGNOSIS — M5417 Radiculopathy, lumbosacral region: Secondary | ICD-10-CM

## 2024-09-06 DIAGNOSIS — R208 Other disturbances of skin sensation: Secondary | ICD-10-CM

## 2024-09-09 ENCOUNTER — Encounter: Payer: Self-pay | Admitting: Neurosurgery

## 2024-09-09 ENCOUNTER — Other Ambulatory Visit: Payer: Self-pay

## 2024-09-09 ENCOUNTER — Ambulatory Visit: Admitting: Neurosurgery

## 2024-09-09 VITALS — BP 136/82 | Ht 72.0 in | Wt 148.2 lb

## 2024-09-09 DIAGNOSIS — Z01818 Encounter for other preprocedural examination: Secondary | ICD-10-CM

## 2024-09-09 DIAGNOSIS — M961 Postlaminectomy syndrome, not elsewhere classified: Secondary | ICD-10-CM

## 2024-09-09 NOTE — Progress Notes (Signed)
 "  REFERRING PHYSICIAN:  Rudolpho Norleen BIRCH, Md [address] [place],  [place] [address]  DOS: 03/30/23, L3-4 XLIF and L3-5 posterior spinal fusion   HISTORY OF PRESENT ILLNESS: Justin Nixon is s/p L3-4 XLIF and L3-5 posterior spinal fusion.  He underwent a spinal cord stimulator trial and had near 100% improvement.  He is able to do more day-to-day tasks.  After it was removed, he noticed that he had a worsening symptoms back to his baseline.  He is very happy with his improvement during the trial.  No family history on file. Social History   Socioeconomic History   Marital status: Married    Spouse name: Not on file   Number of children: Not on file   Years of education: Not on file   Highest education level: Not on file  Occupational History   Not on file  Tobacco Use   Smoking status: Every Day    Average packs/day: 0.5 packs/day for 22.0 years (11.0 ttl pk-yrs)    Types: Cigarettes    Start date: 02/2023    Last attempt to quit: 01/1994    Years since quitting: 30.7   Smokeless tobacco: Never  Vaping Use   Vaping status: Never Used  Substance and Sexual Activity   Alcohol use: Not Currently    Comment: OCC   Drug use: No   Sexual activity: Not on file  Other Topics Concern   Not on file  Social History Narrative   Lives at home with Justin Nixon, wife    Social Drivers of Health   Tobacco Use: High Risk (09/09/2024)   Patient History    Smoking Tobacco Use: Every Day    Smokeless Tobacco Use: Never    Passive Exposure: Not on file  Financial Resource Strain: Not on file  Food Insecurity: Patient Declined (03/30/2023)   Hunger Vital Sign    Worried About Running Out of Food in the Last Year: Patient declined    Ran Out of Food in the Last Year: Patient declined  Transportation Needs: Patient Declined (03/30/2023)   PRAPARE - Administrator, Civil Service (Medical): Patient declined    Lack of Transportation (Non-Medical): Patient  declined  Physical Activity: Not on file  Stress: Not on file  Social Connections: Not on file  Intimate Partner Violence: Patient Declined (03/30/2023)   Humiliation, Afraid, Rape, and Kick questionnaire    Fear of Current or Ex-Partner: Patient declined    Emotionally Abused: Patient declined    Physically Abused: Patient declined    Sexually Abused: Patient declined  Depression (PHQ2-9): Low Risk (07/06/2024)   Depression (PHQ2-9)    PHQ-2 Score: 0  Alcohol Screen: Not on file  Housing: High Risk (03/30/2023)   Housing    Last Housing Risk Score: 98  Utilities: Patient Declined (03/30/2023)   [facility] Utilities    Threatened with loss of utilities: Patient declined  Health Literacy: Not on file    Current Outpatient Medications  Medication Instructions   lisinopril  (ZESTRIL ) 20 MG tablet Take by mouth.   lurasidone  (LATUDA ) 40 mg, Daily after supper   Multiple Vitamins-Minerals (MULTIVITAMIN WITH MINERALS) tablet 1 tablet, Daily   ondansetron  (ZOFRAN ) 4 mg, Oral, Every 8 hours PRN   Allergies[1] PCN        PHYSICAL EXAMINATION:  Vitals:   09/09/24 1017  BP: 136/82    General: Patient is well developed, well nourished, calm, collected, and in no apparent distress.   NEUROLOGICAL:  General: In no acute distress.   Awake, alert, oriented to person,  place, and time.  Pupils equal round and reactive to light.  Facial tone is symmetric.     Strength:            Side Iliopsoas Quads Hamstring PF DF EHL  R 5 5 5 5 5 5   L 5 5 5 5 5 5    Incision c/d/i   ROS (Neurologic):  Negative except as noted above  IMAGING:  07/21/23:  AP and lateral x-rays of the lumbar spine completed which did not show any hardware failure or fracture.  Will review radiologist formal read when available.  Recent imaging shows stable findings.  Imaging from September 1 also was stable.  ASSESSMENT/PLAN:  Justin Nixon is s/p L3-4 XLIF and L3-5 posterior spinal fusion.  At this point,  he meets criteria for postlaminectomy syndrome.  He had an excellent response to stimulator trial.  He is an excellent candidate.  I have recommended placement of a permanent spinal cord stimulator.    I discussed the planned procedure at length with the patient, including the risks, benefits, alternatives, and indications. The risks discussed include but are not limited to bleeding, infection, need for reoperation, spinal fluid leak, stroke, vision loss, anesthetic complication, coma, paralysis, and even death. I also described in detail that improvement was not guaranteed.  The patient expressed understanding of these risks, and asked that we proceed with surgery. I described the surgery in layman's terms, and gave ample opportunity for questions, which were answered to the best of my ability.    I spent a total of 20 minutes in this patient's care today. This time was spent reviewing pertinent records including imaging studies, obtaining and confirming history, performing a directed evaluation, formulating and discussing my recommendations, and documenting the visit within the medical record.   Justin Daisy MD Department of neurosurgery   `      [1]  Allergies Allergen Reactions   Penicillins Itching    TOLERATED CEFAZOLIN  10/10/2020    "

## 2024-09-09 NOTE — Patient Instructions (Signed)
 Please see below for information in regards to your upcoming surgery:   Planned surgery: Thoracic Laminectomy for Spinal Cord Stimulator Placement   Surgery date: 09/21/24 at [facility] (Medical Mall: [address] [place], [place] [address]) - you will find out your arrival time the business day before your surgery.   Pre-op appointment at [facility] Pre-admit Testing: you will receive a call with a date/time for this appointment. If you are scheduled for an in person appointment, Pre-admit Testing is located on the first floor of the [facility] building, [address], [address]. During this appointment, they will advise you which medications you can take the morning of surgery, and which medications you will need to hold for surgery. Labs (such as blood work, EKG) may be done at your pre-op appointment. You are not required to fast for these labs. Should you need to change your pre-op appointment, please call Pre-admit testing at [phone].   Please bring your medication bottles or an up to date medication list to your pre-admit testing appointment (regardless of whether we have a list in your chart).     Surgical clearance: we will send a clearance form to Norleen Rower,  MD. They may wish to see you in their office prior to signing the clearance form. If so, they may call you to schedule an appointment.    Common restrictions after spine surgery: No bending, lifting, or twisting (BLT). Avoid lifting objects heavier than 10 pounds for the first 6 weeks after surgery. Where possible, avoid household activities that involve lifting, bending, reaching, pushing, or pulling such as laundry, vacuuming, grocery shopping, and childcare. Try to arrange for help from friends and family for these activities while you heal. Do not drive while taking prescription pain medication. Weeks 6 through 12 after surgery: avoid lifting more than 25 pounds.      How to contact us :  If you have any questions/concerns before or after surgery, you can reach us  at [phone], or you can send a mychart message. We can be reached by phone or mychart 8am-4pm, Monday-Friday.  *Please note: Calls after 4pm are forwarded to a third party answering service. Mychart messages are not routinely monitored during evenings, weekends, and holidays. Please call our office to contact the answering service for urgent concerns during non-business hours.     If you have FMLA/disability paperwork, please drop it off or fax it to [phone]    Appointments/FMLA & disability paperwork: Reche Hait, & Nichole Registered Nurse/Surgery scheduler: Kendelyn, RN & Katie, RN Certified Medical Assistants: Don, CMA, Elenor, CMA, Damien, CMA, & Auston, [place] Physician Assistants: Lyle Decamp, PA-C, Edsel Goods, PA-C & Glade Boys, PA-C Surgeons: Penne Sharps, MD & [name], MD    [facility] PREADMIT TESTING VISIT and SURGERY INFORMATION SHEET   Now that surgery has been scheduled you can anticipate several phone calls from [facility] services. A pharmacy technician will call you to verify your current list of medications taken at home.               The Pre-Service Center will call to verify your insurance information and to give you billing estimates and information.             The Preadmit Testing Office will be calling to schedule a visit to obtain information for the anesthesia team and provide instructions on preparation for surgery.  What can you expect for the Preadmit Testing Visit: Appointments may  be scheduled in-person or by telephone.  If a telephone visit is scheduled, you may be asked to come into the office to have lab tests or other studies performed.   This visit will not be completed any greater than 14 days prior to your surgery.  If your surgery has been scheduled for a future date, please do not be  alarmed if we have not contacted you to schedule an appointment more than a month prior to the surgery date.    Please be prepared to provide the following information during this appointment:            -Personal medical history                                               -Medication and allergy list            -Any history of problems with anesthesia              -Recent lab work or diagnostic studies            -Please notify us  of any needs we should be aware of to provide the best care possible           -You will be provided with instructions on how to prepare for your surgery.    On The Day of Surgery:  You must have a driver to take you home after surgery, you will be asked not to drive for 24 hours following surgery.  Taxi, Gisele and non-medical transport will not be acceptable means of transportation unless you have a responsible individual who will be traveling with you.  Visitors in the surgical area:   2 people will be able to visit you in your room once your preparation for surgery has been completed. During surgery, your visitors will be asked to wait in the Surgery Waiting Area.  It is not a requirement for them to stay, if they prefer to leave and come back.  Your visitor(s) will be given an update once the surgery has been completed.  No visitors are allowed in the initial recovery room to respect patient privacy and safety.  Once you are more awake and transfer to the secondary recovery area, or are transferred to an inpatient room, visitors will again be able to see you.  To respect and protect your privacy: We will ask on the day of surgery who your driver will be and what the contact number for that individual will be. We will ask if it is okay to share information with this individual, or if there is an alternative individual that we, or the surgeon, should contact to provide updates and information. If family or friends come to the surgical information desk requesting  information about you, who you have not listed with us , no information will be given.   It may be helpful to designate someone as the main contact who will be responsible for updating your other friends and family.    PREADMIT TESTING OFFICE: [phone] SAME DAY SURGERY: [phone] We look forward to caring for you before and throughout the process of your surgery.

## 2024-09-15 ENCOUNTER — Other Ambulatory Visit

## 2024-09-21 ENCOUNTER — Ambulatory Visit: Admit: 2024-09-21 | Admitting: Neurosurgery

## 2024-10-05 ENCOUNTER — Encounter: Admitting: Orthopedic Surgery

## 2024-11-22 ENCOUNTER — Encounter: Admitting: Neurosurgery

## 2024-12-12 ENCOUNTER — Encounter: Admitting: Orthopedic Surgery
